# Patient Record
Sex: Female | Born: 1945 | Hispanic: No | Smoking: Former smoker
Health system: Southern US, Community
[De-identification: ages and names within clinical notes are randomized; demographics above are authoritative.]

## PROBLEM LIST (undated history)

## (undated) DIAGNOSIS — I8290 Acute embolism and thrombosis of unspecified vein: Secondary | ICD-10-CM

## (undated) DIAGNOSIS — I420 Dilated cardiomyopathy: Secondary | ICD-10-CM

## (undated) DIAGNOSIS — I96 Gangrene, not elsewhere classified: Secondary | ICD-10-CM

## (undated) DIAGNOSIS — R200 Anesthesia of skin: Secondary | ICD-10-CM

## (undated) DIAGNOSIS — I82409 Acute embolism and thrombosis of unspecified deep veins of unspecified lower extremity: Secondary | ICD-10-CM

## (undated) DIAGNOSIS — Z5189 Encounter for other specified aftercare: Secondary | ICD-10-CM

## (undated) DIAGNOSIS — E1165 Type 2 diabetes mellitus with hyperglycemia: Secondary | ICD-10-CM

## (undated) DIAGNOSIS — I2510 Atherosclerotic heart disease of native coronary artery without angina pectoris: Secondary | ICD-10-CM

## (undated) DIAGNOSIS — K219 Gastro-esophageal reflux disease without esophagitis: Secondary | ICD-10-CM

## (undated) DIAGNOSIS — I70201 Unspecified atherosclerosis of native arteries of extremities, right leg: Secondary | ICD-10-CM

## (undated) DIAGNOSIS — I255 Ischemic cardiomyopathy: Secondary | ICD-10-CM

## (undated) DIAGNOSIS — I509 Heart failure, unspecified: Secondary | ICD-10-CM

## (undated) DIAGNOSIS — I82403 Acute embolism and thrombosis of unspecified deep veins of lower extremity, bilateral: Secondary | ICD-10-CM

## (undated) DIAGNOSIS — Z9289 Personal history of other medical treatment: Secondary | ICD-10-CM

## (undated) DIAGNOSIS — E118 Type 2 diabetes mellitus with unspecified complications: Secondary | ICD-10-CM

## (undated) HISTORY — DX: Acute embolism and thrombosis of unspecified deep veins of unspecified lower extremity: I82.409

## (undated) HISTORY — PX: VAGINAL HYSTERECTOMY: SUR661

## (undated) HISTORY — DX: Atherosclerotic heart disease of native coronary artery without angina pectoris: I25.10

## (undated) HISTORY — PX: FACIAL RECONSTRUCTION SURGERY: SHX631

## (undated) HISTORY — DX: Encounter for other specified aftercare: Z51.89

## (undated) HISTORY — DX: Acute embolism and thrombosis of unspecified vein: I82.90

## (undated) HISTORY — PX: CARDIAC CATHETERIZATION: SHX172

## (undated) HISTORY — DX: Heart failure, unspecified: I50.9

## (undated) HISTORY — DX: Gangrene, not elsewhere classified: I96

## (undated) HISTORY — DX: Anesthesia of skin: R20.0

---

## 1979-04-25 HISTORY — PX: SPINE SURGERY: SHX786

## 1979-04-25 HISTORY — PX: LUMBAR DISC SURGERY: SHX700

## 2001-07-09 ENCOUNTER — Encounter: Payer: Self-pay | Admitting: *Deleted

## 2001-07-09 ENCOUNTER — Emergency Department (HOSPITAL_COMMUNITY): Admission: EM | Admit: 2001-07-09 | Discharge: 2001-07-09 | Payer: Self-pay | Admitting: *Deleted

## 2006-09-29 ENCOUNTER — Emergency Department (HOSPITAL_COMMUNITY): Admission: EM | Admit: 2006-09-29 | Discharge: 2006-09-29 | Payer: Self-pay | Admitting: Emergency Medicine

## 2006-10-27 ENCOUNTER — Emergency Department (HOSPITAL_COMMUNITY): Admission: EM | Admit: 2006-10-27 | Discharge: 2006-10-27 | Payer: Self-pay | Admitting: Emergency Medicine

## 2008-04-04 ENCOUNTER — Emergency Department (HOSPITAL_COMMUNITY): Admission: EM | Admit: 2008-04-04 | Discharge: 2008-04-04 | Payer: Self-pay | Admitting: Emergency Medicine

## 2008-09-01 ENCOUNTER — Emergency Department (HOSPITAL_COMMUNITY): Admission: EM | Admit: 2008-09-01 | Discharge: 2008-09-01 | Payer: Self-pay | Admitting: Emergency Medicine

## 2010-02-25 ENCOUNTER — Emergency Department (HOSPITAL_COMMUNITY): Admission: EM | Admit: 2010-02-25 | Discharge: 2010-02-25 | Payer: Self-pay | Admitting: Emergency Medicine

## 2010-11-09 LAB — POCT I-STAT, CHEM 8
BUN: 11 mg/dL (ref 6–23)
Calcium, Ion: 1.45 mmol/L — ABNORMAL HIGH (ref 1.12–1.32)
Chloride: 104 mEq/L (ref 96–112)
Creatinine, Ser: 0.9 mg/dL (ref 0.4–1.2)
Glucose, Bld: 296 mg/dL — ABNORMAL HIGH (ref 70–99)
HCT: 42 % (ref 36.0–46.0)
Hemoglobin: 14.3 g/dL (ref 12.0–15.0)
Potassium: 4.5 mEq/L (ref 3.5–5.1)
Sodium: 136 mEq/L (ref 135–145)
TCO2: 25 mmol/L (ref 0–100)

## 2010-11-09 LAB — RAPID STREP SCREEN (MED CTR MEBANE ONLY): Streptococcus, Group A Screen (Direct): NEGATIVE

## 2011-05-22 LAB — URINE MICROSCOPIC-ADD ON

## 2011-05-22 LAB — COMPREHENSIVE METABOLIC PANEL
ALT: 24
AST: 17
Albumin: 4
CO2: 24
Calcium: 11.5 — ABNORMAL HIGH
GFR calc Af Amer: >60
GFR calc non Af Amer: 56 — ABNORMAL LOW
Sodium: 135
Total Protein: 7.8

## 2011-05-22 LAB — CBC
HCT: 43.8
Hemoglobin: 15
MCHC: 34.3
MCV: 88.1
Platelets: 295
RBC: 4.97
RDW: 13.3
WBC: 8.3

## 2011-05-22 LAB — DIFFERENTIAL
Eosinophils Absolute: 0.1
Eosinophils Relative: 1
Lymphs Abs: 3.1
Monocytes Absolute: 0.6
Monocytes Relative: 8

## 2011-05-22 LAB — URINALYSIS, ROUTINE W REFLEX MICROSCOPIC
Bilirubin Urine: NEGATIVE
Glucose, UA: >1000 — AB
Hgb urine dipstick: NEGATIVE
Ketones, ur: NEGATIVE
pH: 5.5

## 2011-08-12 ENCOUNTER — Ambulatory Visit: Payer: Self-pay

## 2011-08-12 DIAGNOSIS — E109 Type 1 diabetes mellitus without complications: Secondary | ICD-10-CM

## 2012-02-15 ENCOUNTER — Emergency Department (HOSPITAL_COMMUNITY)
Admission: EM | Admit: 2012-02-15 | Discharge: 2012-02-15 | Disposition: A | Discharge status: Home / Self Care | Payer: Self-pay | Source: Home / Self Care | Attending: Emergency Medicine | Admitting: Emergency Medicine

## 2012-02-15 ENCOUNTER — Encounter (HOSPITAL_COMMUNITY): Payer: Self-pay

## 2012-02-15 DIAGNOSIS — N764 Abscess of vulva: Secondary | ICD-10-CM

## 2012-02-15 DIAGNOSIS — L259 Unspecified contact dermatitis, unspecified cause: Secondary | ICD-10-CM

## 2012-02-15 DIAGNOSIS — L309 Dermatitis, unspecified: Secondary | ICD-10-CM

## 2012-02-15 LAB — POCT RAPID STREP A: Streptococcus, Group A Screen (Direct): NEGATIVE

## 2012-02-15 MED ORDER — TRIAMCINOLONE ACETONIDE 0.5 % EX OINT: TOPICAL_OINTMENT | Freq: Two times a day (BID) | CUTANEOUS | Status: DC

## 2012-02-15 MED ORDER — HYDROXYZINE HCL 25 MG PO TABS: 25.0000 mg | ORAL_TABLET | Freq: Four times a day (QID) | ORAL | Status: AC

## 2012-02-15 MED ORDER — HYDROCODONE-ACETAMINOPHEN 5-500 MG PO TABS: 1.0000 | ORAL_TABLET | Freq: Four times a day (QID) | ORAL | Status: AC | PRN

## 2012-02-15 MED ORDER — DOXYCYCLINE HYCLATE 100 MG PO CAPS: 100.0000 mg | ORAL_CAPSULE | Freq: Two times a day (BID) | ORAL | Status: AC

## 2012-02-15 NOTE — ED Notes (Signed)
C/o itchy rash all over for 2 months.  Also c/o swollen painful area to labia for 1 week.

## 2012-02-15 NOTE — Discharge Instructions (Signed)
Keep wound clean and dry. Can clean with soap and water. Take the prescribed medications as instructed. Be aware that Vicodin can make you drowsy and he should not drive after taking this medication. Can also take over-the-counter ibuprofen every 8 hours as needed for pain and swelling. Return in 24 hours for packing removal and wound check. 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 Followup with dermatologist if persistent or worsening skin rash.

## 2012-02-16 NOTE — ED Provider Notes (Addendum)
History     CSN: [id number]  Arrival date & time 02/15/12  1345   First MD Initiated Contact with Patient 02/15/12 1350      Chief Complaint  Patient presents with  . Rash  . Recurrent Skin Infections    (Consider location/radiation/quality/duration/timing/severity/associated sxs/prior treatment) HPI Comments: 66 year old female with history of recurrent strep throat. Here complaining of:  #1) generalized body rash for 2 months. Started in the legs arms and torso in the form of dry, raised, pruriginous plaques. Has used over-the-counter hydrocortisone with some improvement. No fever. No joint pain or swelling. No general malaise. No mouth ulcers. #2) Tender knot in the right genital area for 2 days.    History reviewed. No pertinent past medical history.  Past Surgical History  Procedure Date  . Abdominal hysterectomy   . Back surgery     No family history on file.  History  Substance Use Topics  . Smoking status: Never Smoker   . Smokeless tobacco: Not on file  . Alcohol Use: No    OB History    Grav Para Term Preterm Abortions TAB SAB Ect Mult Living                  Review of Systems  Constitutional: Negative for fever, chills and appetite change.       10 systems reviewed and  pertinent negative and positive symptoms are as per HPI.     HENT: Negative for congestion, sore throat and neck pain.   Gastrointestinal: Negative for nausea, vomiting, abdominal pain and diarrhea.  Skin: Positive for rash.  Neurological: Negative for dizziness and headaches.  All other systems reviewed and are negative.    Allergies  Penicillins and Sulfa antibiotics  Home Medications   Current Outpatient Rx  Name Route Sig Dispense Refill  . DOXYCYCLINE HYCLATE 100 MG PO CAPS Oral Take 1 capsule (100 mg total) by mouth 2 (two) times daily. 20 capsule 0  . HYDROCODONE-ACETAMINOPHEN 5-500 MG PO TABS Oral Take 1 tablet by mouth every 6 (six) hours as needed for pain. 15 tablet  0  . HYDROXYZINE HCL 25 MG PO TABS Oral Take 1 tablet (25 mg total) by mouth every 6 (six) hours. 20 tablet 0  . TRIAMCINOLONE ACETONIDE 0.5 % EX OINT Topical Apply topically 2 (two) times daily. 30 g 0    BP 158/73  Pulse 99  Temp 98.3 F (36.8 C) (Oral)  Resp 18  SpO2 98%  Physical Exam  Nursing note and vitals reviewed. Constitutional: She is oriented to person, place, and time. She appears well-developed and well-nourished. No distress.  HENT:  Head: Normocephalic and atraumatic.  Nose: Nose normal.  Mouth/Throat: Oropharynx is clear and moist. No oropharyngeal exudate.  Eyes: Conjunctivae are normal. Pupils are equal, round, and reactive to light. No scleral icterus.  Neck: Normal range of motion. Neck supple.  Cardiovascular: Normal heart sounds.   Pulmonary/Chest: Effort normal and breath sounds normal.  Abdominal: Soft. She exhibits no distension. There is no tenderness.  Genitourinary:       There is an area of erythema and tender fluctuant mass of about 2.5 cm diameter. Located in right labia majora. findings consistent with an abscess. No significant associated cellulitis. Mass is not close to vaginal vestibule or labia minora no findings suggestive of Bartolins cyst.    No inguinal enlarged lymphadenopathies.   Lymphadenopathy:    She has no cervical adenopathy.  Neurological: She is alert and oriented to person, place,  and time.  Skin:       Raised, dry, red plaques of different size with peeling of the skin scattered in extremities and torso. Pruriginous.       ED Course  INCISION AND DRAINAGE Performed by: Sharin Grave Authorized by: Sharin Grave Consent: Verbal consent obtained. Risks and benefits: risks, benefits and alternatives were discussed Consent given by: patient Patient understanding: patient states understanding of the procedure being performed Patient consent: the patient's understanding of the procedure matches consent given Type:  abscess Body area: anogenital Location details: vulva Anesthesia: local infiltration Local anesthetic: lidocaine 1% with epinephrine Anesthetic total: 2 ml Scalpel size: 11 Incision type: single straight Complexity: simple Drainage: purulent Drainage amount: moderate Packing material: 1/4 in iodoform gauze Patient tolerance: Patient tolerated the procedure well with no immediate complications. Comments: Dry dressing and antibiotic ointment applied. Abscess culture pending.   (including critical care time)   Labs Reviewed  POCT RAPID STREP A ([facility] CARE ONLY)  CULTURE, ROUTINE-ABSCESS   No results found.   1. Dermatitis   2. Labial abscess       MDM  Psoriasis-like rash in this patient with recurrent strep throat infections. Suggestive of guttate psoriasis although negative rapid strep test today. Appears responsive to steroids. Prescribed triamcinolone ointment and hydroxyzine. Allergic to penicillins and sulfas.  Right labial abscess was drained today. Treated with doxycycline, cultures pending. Asked to return in 24 or 48-hour is for packing removal. Or return earlier if fever or worsening symptoms.        Sharin Grave, MD 02/17/12 1412  Sharin Grave, MD 02/17/12 1415

## 2012-02-18 LAB — CULTURE, ROUTINE-ABSCESS

## 2012-05-24 DIAGNOSIS — I82403 Acute embolism and thrombosis of unspecified deep veins of lower extremity, bilateral: Secondary | ICD-10-CM

## 2012-05-24 DIAGNOSIS — I70201 Unspecified atherosclerosis of native arteries of extremities, right leg: Secondary | ICD-10-CM

## 2012-05-24 HISTORY — DX: Unspecified atherosclerosis of native arteries of extremities, right leg: I70.201

## 2012-05-24 HISTORY — DX: Acute embolism and thrombosis of unspecified deep veins of lower extremity, bilateral: I82.403

## 2012-06-21 ENCOUNTER — Encounter (HOSPITAL_COMMUNITY): Payer: Self-pay | Admitting: *Deleted

## 2012-06-21 ENCOUNTER — Other Ambulatory Visit: Payer: Self-pay

## 2012-06-21 ENCOUNTER — Inpatient Hospital Stay (HOSPITAL_COMMUNITY): Payer: Medicare Other

## 2012-06-21 ENCOUNTER — Inpatient Hospital Stay (HOSPITAL_COMMUNITY)
Admission: EM | Admit: 2012-06-21 | Discharge: 2012-07-09 | DRG: 287 | Disposition: A | Discharge status: Home / Self Care | Payer: Medicare Other | Attending: Cardiology | Admitting: Cardiology

## 2012-06-21 ENCOUNTER — Emergency Department (HOSPITAL_COMMUNITY): Payer: Medicare Other

## 2012-06-21 DIAGNOSIS — I447 Left bundle-branch block, unspecified: Secondary | ICD-10-CM | POA: Diagnosis present

## 2012-06-21 DIAGNOSIS — R1310 Dysphagia, unspecified: Secondary | ICD-10-CM | POA: Diagnosis present

## 2012-06-21 DIAGNOSIS — R0902 Hypoxemia: Secondary | ICD-10-CM | POA: Insufficient documentation

## 2012-06-21 DIAGNOSIS — I5043 Acute on chronic combined systolic (congestive) and diastolic (congestive) heart failure: Principal | ICD-10-CM | POA: Diagnosis present

## 2012-06-21 DIAGNOSIS — I129 Hypertensive chronic kidney disease with stage 1 through stage 4 chronic kidney disease, or unspecified chronic kidney disease: Secondary | ICD-10-CM | POA: Diagnosis present

## 2012-06-21 DIAGNOSIS — Z882 Allergy status to sulfonamides status: Secondary | ICD-10-CM

## 2012-06-21 DIAGNOSIS — L0390 Cellulitis, unspecified: Secondary | ICD-10-CM | POA: Diagnosis present

## 2012-06-21 DIAGNOSIS — E210 Primary hyperparathyroidism: Secondary | ICD-10-CM | POA: Diagnosis present

## 2012-06-21 DIAGNOSIS — Z8249 Family history of ischemic heart disease and other diseases of the circulatory system: Secondary | ICD-10-CM

## 2012-06-21 DIAGNOSIS — N179 Acute kidney failure, unspecified: Secondary | ICD-10-CM | POA: Diagnosis present

## 2012-06-21 DIAGNOSIS — L0291 Cutaneous abscess, unspecified: Secondary | ICD-10-CM

## 2012-06-21 DIAGNOSIS — I255 Ischemic cardiomyopathy: Secondary | ICD-10-CM | POA: Diagnosis present

## 2012-06-21 DIAGNOSIS — Z794 Long term (current) use of insulin: Secondary | ICD-10-CM

## 2012-06-21 DIAGNOSIS — E875 Hyperkalemia: Secondary | ICD-10-CM | POA: Diagnosis present

## 2012-06-21 DIAGNOSIS — I509 Heart failure, unspecified: Secondary | ICD-10-CM | POA: Insufficient documentation

## 2012-06-21 DIAGNOSIS — J90 Pleural effusion, not elsewhere classified: Secondary | ICD-10-CM | POA: Diagnosis present

## 2012-06-21 DIAGNOSIS — I5041 Acute combined systolic (congestive) and diastolic (congestive) heart failure: Secondary | ICD-10-CM

## 2012-06-21 DIAGNOSIS — I82409 Acute embolism and thrombosis of unspecified deep veins of unspecified lower extremity: Secondary | ICD-10-CM | POA: Diagnosis present

## 2012-06-21 DIAGNOSIS — E119 Type 2 diabetes mellitus without complications: Secondary | ICD-10-CM | POA: Diagnosis present

## 2012-06-21 DIAGNOSIS — L02419 Cutaneous abscess of limb, unspecified: Secondary | ICD-10-CM | POA: Diagnosis present

## 2012-06-21 DIAGNOSIS — I824Z9 Acute embolism and thrombosis of unspecified deep veins of unspecified distal lower extremity: Secondary | ICD-10-CM | POA: Diagnosis present

## 2012-06-21 DIAGNOSIS — Z880 Allergy status to penicillin: Secondary | ICD-10-CM

## 2012-06-21 DIAGNOSIS — I2589 Other forms of chronic ischemic heart disease: Secondary | ICD-10-CM | POA: Diagnosis present

## 2012-06-21 DIAGNOSIS — Z86718 Personal history of other venous thrombosis and embolism: Secondary | ICD-10-CM

## 2012-06-21 DIAGNOSIS — D638 Anemia in other chronic diseases classified elsewhere: Secondary | ICD-10-CM | POA: Diagnosis present

## 2012-06-21 DIAGNOSIS — A498 Other bacterial infections of unspecified site: Secondary | ICD-10-CM | POA: Diagnosis not present

## 2012-06-21 DIAGNOSIS — R0600 Dyspnea, unspecified: Secondary | ICD-10-CM

## 2012-06-21 DIAGNOSIS — N183 Chronic kidney disease, stage 3 unspecified: Secondary | ICD-10-CM | POA: Diagnosis present

## 2012-06-21 DIAGNOSIS — I5021 Acute systolic (congestive) heart failure: Secondary | ICD-10-CM

## 2012-06-21 DIAGNOSIS — R600 Localized edema: Secondary | ICD-10-CM | POA: Diagnosis present

## 2012-06-21 DIAGNOSIS — I743 Embolism and thrombosis of arteries of the lower extremities: Secondary | ICD-10-CM | POA: Diagnosis present

## 2012-06-21 DIAGNOSIS — N390 Urinary tract infection, site not specified: Secondary | ICD-10-CM | POA: Diagnosis not present

## 2012-06-21 DIAGNOSIS — I739 Peripheral vascular disease, unspecified: Secondary | ICD-10-CM

## 2012-06-21 DIAGNOSIS — I4891 Unspecified atrial fibrillation: Secondary | ICD-10-CM | POA: Diagnosis present

## 2012-06-21 DIAGNOSIS — R609 Edema, unspecified: Secondary | ICD-10-CM

## 2012-06-21 DIAGNOSIS — I2510 Atherosclerotic heart disease of native coronary artery without angina pectoris: Secondary | ICD-10-CM | POA: Diagnosis present

## 2012-06-21 LAB — BLOOD GAS, ARTERIAL
Acid-Base Excess: 0.3 mmol/L (ref 0.0–2.0)
Bicarbonate: 24 mEq/L (ref 20.0–24.0)
TCO2: 21.4 mmol/L (ref 0–100)
pCO2 arterial: 37.3 mmHg (ref 35.0–45.0)
pH, Arterial: 7.424 (ref 7.350–7.450)
pO2, Arterial: 67.3 mmHg — ABNORMAL LOW (ref 80.0–100.0)

## 2012-06-21 LAB — CBC WITH DIFFERENTIAL/PLATELET
Basophils Absolute: 0 10*3/uL (ref 0.0–0.1)
Eosinophils Relative: 2 % (ref 0–5)
HCT: 36 % (ref 36.0–46.0)
Hemoglobin: 11.5 g/dL — ABNORMAL LOW (ref 12.0–15.0)
Lymphocytes Relative: 27 % (ref 12–46)
MCHC: 31.9 g/dL (ref 30.0–36.0)
MCV: 81.8 fL (ref 78.0–100.0)
Monocytes Absolute: 0.7 10*3/uL (ref 0.1–1.0)
Monocytes Relative: 9 % (ref 3–12)
Neutro Abs: 5.3 10*3/uL (ref 1.7–7.7)
RDW: 15.2 % (ref 11.5–15.5)
WBC: 8.4 10*3/uL (ref 4.0–10.5)

## 2012-06-21 LAB — BASIC METABOLIC PANEL
BUN: 12 mg/dL (ref 6–23)
CO2: 25 mEq/L (ref 19–32)
Chloride: 101 mEq/L (ref 96–112)
Creatinine, Ser: 0.9 mg/dL (ref 0.50–1.10)
Potassium: 3.8 mEq/L (ref 3.5–5.1)

## 2012-06-21 LAB — PRO B NATRIURETIC PEPTIDE: Pro B Natriuretic peptide (BNP): 9636 pg/mL — ABNORMAL HIGH (ref 0–125)

## 2012-06-21 MED ORDER — ONDANSETRON HCL 4 MG/2ML IJ SOLN
4.0000 mg | Freq: Four times a day (QID) | INTRAMUSCULAR | Status: DC | PRN
  Administered 2012-06-23 – 2012-06-29 (×11): 4 mg via INTRAVENOUS
  Filled 2012-06-21 (×12): qty 2

## 2012-06-21 MED ORDER — FUROSEMIDE 10 MG/ML IJ SOLN
20.0000 mg | Freq: Once | INTRAMUSCULAR | Status: AC
  Administered 2012-06-21: 20 mg via INTRAVENOUS
  Filled 2012-06-21: qty 4

## 2012-06-21 MED ORDER — SODIUM CHLORIDE 0.9 % IV SOLN
Freq: Once | INTRAVENOUS | Status: AC
  Administered 2012-06-21: 10 mL/h via INTRAVENOUS

## 2012-06-21 MED ORDER — VANCOMYCIN HCL 1000 MG IV SOLR
750.0000 mg | Freq: Two times a day (BID) | INTRAVENOUS | Status: DC
  Administered 2012-06-22: 750 mg via INTRAVENOUS
  Filled 2012-06-21: qty 750

## 2012-06-21 MED ORDER — MORPHINE SULFATE 2 MG/ML IJ SOLN
2.0000 mg | INTRAMUSCULAR | Status: DC | PRN
  Administered 2012-06-21 – 2012-07-07 (×16): 2 mg via INTRAVENOUS
  Filled 2012-06-21 (×17): qty 1

## 2012-06-21 MED ORDER — ONDANSETRON HCL 4 MG PO TABS
4.0000 mg | ORAL_TABLET | Freq: Four times a day (QID) | ORAL | Status: DC | PRN
  Administered 2012-06-23 – 2012-06-29 (×5): 4 mg via ORAL
  Filled 2012-06-21 (×5): qty 1

## 2012-06-21 MED ORDER — IOHEXOL 350 MG/ML SOLN
100.0000 mL | Freq: Once | INTRAVENOUS | Status: AC | PRN
  Administered 2012-06-21: 100 mL via INTRAVENOUS

## 2012-06-21 NOTE — ED Notes (Signed)
MD at bedside. 

## 2012-06-21 NOTE — ED Provider Notes (Cosign Needed Addendum)
History     CSN: [id number]  Arrival date & time 06/21/12  1557   First MD Initiated Contact with Patient 06/21/12 1646      Chief Complaint  Patient presents with  . Shortness of Breath  . Leg Swelling    (Consider location/radiation/quality/duration/timing/severity/associated sxs/prior treatment) The history is provided by the patient.   66 year old, female, presents to emergency department complaining of lower extremity, swelling.  For the past 2 weeks.  She says she is nasal congestion, and postnasal drainage, which causes her to cough, occasionally.  She coughs up white sputum.  She denies pain anywhere except in her legs.  She denies shortness of breath.  She has not had nausea, vomiting, fevers, or chills.  She denies recent surgery or travel.  She has not had a history of congestive heart failure, renal or kidney disease.  She denies thyroid disease.  She has always slept on 2 pillows and this is unchanged.  She has nocturia times one, which is normal for her.  History reviewed. No pertinent past medical history.  Past Surgical History  Procedure Date  . Abdominal hysterectomy   . Back surgery   . Cesarean section     History reviewed. No pertinent family history.  History  Substance Use Topics  . Smoking status: Never Smoker   . Smokeless tobacco: Never Used  . Alcohol Use: No    OB History    Grav Para Term Preterm Abortions TAB SAB Ect Mult Living                  Review of Systems  Constitutional: Negative for fever, chills and diaphoresis.  Respiratory: Positive for cough. Negative for chest tightness and shortness of breath.   Cardiovascular: Positive for leg swelling. Negative for chest pain.  Gastrointestinal: Negative for nausea, vomiting, abdominal pain and diarrhea.  Musculoskeletal: Negative for back pain.  Skin: Negative for rash.  Neurological: Negative for weakness and headaches.  Hematological: Does not bruise/bleed easily.    Psychiatric/Behavioral: Negative for confusion.  All other systems reviewed and are negative.    Allergies  Penicillins and Sulfa antibiotics  Home Medications   Current Outpatient Rx  Name Route Sig Dispense Refill  . IBUPROFEN 200 MG PO TABS Oral Take 200 mg by mouth every 6 (six) hours as needed. Pain    . ADULT MULTIVITAMIN LIQUID CH Oral Take 5 mLs by mouth daily.    . TRIAMCINOLONE ACETONIDE 0.5 % EX OINT Topical Apply 1 application topically 2 (two) times daily.      BP 126/69  Pulse 93  Temp 98.2 F (36.8 C) (Oral)  Resp 16  SpO2 94%  Physical Exam  Nursing note and vitals reviewed. Constitutional: She is oriented to person, place, and time. She appears well-developed and well-nourished. No distress.  HENT:  Head: Normocephalic and atraumatic.  Eyes: Conjunctivae normal and EOM are normal.  Neck: Normal range of motion. Neck supple. No JVD present.  Cardiovascular: Normal rate, regular rhythm and intact distal pulses.   No murmur heard. Pulmonary/Chest: Effort normal. No respiratory distress. She has rales.       Bilateral basilar rales posteriorly  Abdominal: Soft. Bowel sounds are normal. She exhibits no distension. There is no rebound and no guarding.  Musculoskeletal: Normal range of motion. She exhibits edema. She exhibits no tenderness.       3+ bilateral lower extremity pitting edema from the knees, to her feet  Neurological: She is alert and oriented to person,  place, and time. No cranial nerve deficit.  Skin: Skin is warm and dry. No rash noted. No erythema.  Psychiatric: She has a normal mood and affect. Thought content normal.    ED Course  Procedures (including critical care time) bilateral pitting edema, with rales.  Room air, hypoxia, and cough with clear sputum.  Symptoms suggest congestive heart failure.  We will perform chest x-ray, and laboratory testing, for evaluation.  I will establish an IV and give her IV Lasix.   Labs Reviewed  BASIC  METABOLIC PANEL  CBC WITH DIFFERENTIAL   Dg Chest 2 View  06/21/2012  *RADIOLOGY REPORT*  Clinical Data: Shortness of breath, cough, hyperglycemia  CHEST - 2 VIEW  Comparison: None.  Findings: Borderline cardiomegaly.  No pulmonary edema.  Bilateral small pleural effusion with bilateral basilar atelectasis or infiltrate.  There is thickening or small amount of fluid in the right minor fissure.  IMPRESSION: No pulmonary edema.  Bilateral small pleural effusion with bilateral basilar atelectasis or infiltrate.   Original Report Authenticated By: Natasha Mead, M.D.      No diagnosis found.  ECG. Normal sinus rhythm at 94 beats per minute. Left bundle branch block. Normal axis. Occasional PVC. Nonspecific T-wave changes  9:36 PM Spoke with Dr. Conley Rolls.  He will come admit pt for further eval and tx  MDM  Lower extremity, swelling, with room air, hypoxia        Cheri Guppy, MD 06/21/12 2055  Cheri Guppy, MD 06/21/12 2136

## 2012-06-21 NOTE — ED Notes (Signed)
RT at bedside.

## 2012-06-21 NOTE — H&P (Signed)
[facility] History and Physical  [name] [name]:[id number] DOB: [birth year]    PCP:   She has no PCP.  Chief Complaint: Leg swelling and pain.  HPI: [name] [name] is an 66 y.o. female with no significant medical problem but only by virtue of having seen no PCP for over 30 years, presents to ER with bilateral lower extremity edema for 2 weeks, with mild DOE, but no chest pain.  She has "sprained" her right ankle three months ago.  The legs started to swell with increased erythema and calf tenderness.  She admitted to loud snorning, but denied any exertional chest pain, pleuritic CP, fever, chill, or cough.  She was a smoker, but no longer smokes.  Evaluation in the ER with ABG on RA showing 7.4/37/paO2 67.  This gives a a-A gradient of 36 with expected gradient of only 20.  Her WBC is normal, Hb 11.4.  Her Calcium is slightly elevated to 11.  Her Na is 133, and she has normal renal fx tests.  Her CXR showed mild pleural effusions bilaterally, and mild cardiomegaly, but no edema.  Her EKG showed LBBB with no old to compare.  No troponin was obtained.  She was given 20mg  of IV lasix and hospitalist was asked to admit her for further work up of all those above problems.  Rewiew of Systems:  Constitutional: Negative for malaise, fever and chills. No significant weight loss or weight gain Eyes: Negative for eye pain, redness and discharge, diplopia, visual changes, or flashes of light. ENMT: Negative for ear pain, hoarseness, nasal congestion, sinus pressure and sore throat. No headaches; tinnitus, drooling, or problem swallowing. Cardiovascular: Negative for chest pain, palpitations, diaphoresis, dyspnea and peripheral edema. ; No orthopnea, PND Respiratory: Negative for cough, hemoptysis, wheezing and stridor. No pleuritic chestpain. Gastrointestinal: Negative for nausea, vomiting, diarrhea, constipation, abdominal pain, melena, blood in stool, hematemesis, jaundice and rectal  bleeding.    Genitourinary: Negative for frequency, dysuria, incontinence,flank pain and hematuria; Musculoskeletal: Negative for back pain and neck pain.  Skin: . Negative for pruritus, rash, abrasions, bruising and skin lesion.; ulcerations Neuro: Negative for headache, lightheadedness and neck stiffness. Negative for weakness, altered level of consciousness , altered mental status, extremity weakness, burning feet, involuntary movement, seizure and syncope.  Psych: negative for anxiety, depression, insomnia, tearfulness, panic attacks, hallucinations, paranoia, suicidal or homicidal ideation    History reviewed. No pertinent past medical history.  Past Surgical History  Procedure Date  . Abdominal hysterectomy   . Back surgery   . Cesarean section     Medications:  HOME MEDS: Prior to Admission medications   Medication Sig Start Date End Date Taking? Authorizing Provider  ibuprofen (ADVIL,MOTRIN) 200 MG tablet Take 200 mg by mouth every 6 (six) hours as needed. Pain   Yes Historical Provider, MD  Multiple Vitamin (MULTIVITAMIN) LIQD Take 5 mLs by mouth daily.   Yes Historical Provider, MD  triamcinolone ointment (KENALOG) 0.5 % Apply 1 application topically 2 (two) times daily. 02/15/12 02/14/13 Yes Adlih Moreno-Coll, MD     Allergies:  Allergies  Allergen Reactions  . Penicillins Anaphylaxis  . Sulfa Antibiotics Anaphylaxis    Social History:   reports that she has never smoked. She has never used smokeless tobacco. She reports that she does not drink alcohol or use illicit drugs.  Family History: History reviewed. No pertinent family history.   Physical Exam: Filed Vitals:   06/21/12 1719 06/21/12 1726 06/21/12 1800 06/21/12 2030  BP:  128/76 119/74  Pulse:   91 103  Temp:      TempSrc:      Resp:   23 16  SpO2: 95% 94% 94% 97%   Blood pressure 119/74, pulse 103, temperature 98.2 F (36.8 C), temperature source Oral, resp. rate 16, SpO2 97.00%.  GEN:   Pleasant  patient lying in the stretcher in no acute distress; cooperative with exam. PSYCH:  alert and oriented x4; does not appear anxious or depressed; affect is appropriate. HEENT: Mucous membranes pink and anicteric; PERRLA; EOM intact; no cervical lymphadenopathy nor thyromegaly or carotid bruit; no JVD; There were no stridor. Neck is very supple. Breasts:: Not examined CHEST WALL: No tenderness CHEST: Normal respiration, She has bilateral crackles with no wheezes.  HEART: Regular rate and rhythm.  There are no murmur, rub, or gallops.   BACK: No kyphosis or scoliosis; no CVA tenderness ABDOMEN: soft and non-tender; no masses, no organomegaly, normal abdominal bowel sounds; no pannus; no intertriginous candida. There is no rebound and no distention. Rectal Exam: Not done EXTREMITIES: No bone or joint deformity; age-appropriate arthropathy of the hands and knees; She has erythema with bilateral edema. Genitalia: not examined PULSES: 2+ and symmetric SKIN: Normal hydration no rash or ulceration CNS: Cranial nerves 2-12 grossly intact no focal lateralizing neurologic deficit.  Speech is fluent; uvula elevated with phonation, facial symmetry and tongue midline. DTR are normal bilaterally, cerebella exam is intact, barbinski is negative and strengths are equaled bilaterally.  No sensory loss.   Labs on Admission:  Basic Metabolic Panel:  Lab 06/21/12 4540  NA 133*  K 3.8  CL 101  CO2 25  GLUCOSE 159*  BUN 12  CREATININE 0.90  CALCIUM 11.0*  MG --  PHOS --   Liver Function Tests: No results found for this basename: AST:5,ALT:5,ALKPHOS:5,BILITOT:5,PROT:5,ALBUMIN:5 in the last 168 hours No results found for this basename: LIPASE:5,AMYLASE:5 in the last 168 hours No results found for this basename: AMMONIA:5 in the last 168 hours CBC:  Lab 06/21/12 1806  WBC 8.4  NEUTROABS 5.3  HGB 11.5*  HCT 36.0  MCV 81.8  PLT 282   Cardiac Enzymes: No results found for this basename:  CKTOTAL:5,CKMB:5,CKMBINDEX:5,TROPONINI:5 in the last 168 hours  CBG: No results found for this basename: GLUCAP:5 in the last 168 hours   Radiological Exams on Admission: Dg Chest 2 View  06/21/2012  *RADIOLOGY REPORT*  Clinical Data: Shortness of breath, cough, hyperglycemia  CHEST - 2 VIEW  Comparison: None.  Findings: Borderline cardiomegaly.  No pulmonary edema.  Bilateral small pleural effusion with bilateral basilar atelectasis or infiltrate.  There is thickening or small amount of fluid in the right minor fissure.  IMPRESSION: No pulmonary edema.  Bilateral small pleural effusion with bilateral basilar atelectasis or infiltrate.   Original Report Authenticated By: Natasha Mead, M.D.     EKG: Independently reviewed.  LBBB.   Assessment/Plan Present on Admission:  .Hypoxemia .Pedal edema .Cellulitis .CHF (congestive heart failure) .LBBB (left bundle branch block)  PLAN:  This is a 66 yo who has not gotten any primary care for over 30 years, presents with hypoxemia and quite elevated A-a gradient of 37 where it really should be about 20.  She also has bilateral pedal edema, with cellulitis, and possible DVTs.  She has symptoms suggestive of CHF, along with having LBBB, but no chest pain.  Her history suggests that she may have undiagnosed sleep apnea as well.  Part of the problem is that she has not  received adequate prior medical work up.  She also has a large Aa gradient with hypoxemia with paO2 of only 67.  Her differential is broad.  I will admit her to telemetry for work up to include ECHO, leg doppler, CTPA, TSH, r/out with serial troponins.  I will start her on IV heparin until we can exclude DVT and PE.  Although the differential is broad, and she will get the proper work up, my highest clinical suspicion is that she has CHF.    Will admit her to telemetry under [facility] service, she is a full code.  Other plans as per orders.  Code Status: FULL Unk Lightning, MD. [facility] Pager [phone] 7pm to 7am.  06/21/2012, 10:24 PM

## 2012-06-21 NOTE — ED Notes (Signed)
Pt states for the past week she's been having bil leg pain, worse pain in R leg, pt has swelling in legs and feet, pt states [facility] went to [facility] and was told she has signs of CHF, pt states "that doctor doesn't know me though, I get constipated and get bloated, then i take a laxative and the swelling goes away, I can tell I"m swollen from my feet to under my breasts". Denies n/v/d. Pt states she has fell mult times, last time in June. Denies numbness/tinlging/SOB, states she has coughing at times to bring mucus up from sinus infection.

## 2012-06-22 ENCOUNTER — Encounter (HOSPITAL_COMMUNITY): Payer: Self-pay | Admitting: Emergency Medicine

## 2012-06-22 DIAGNOSIS — M79609 Pain in unspecified limb: Secondary | ICD-10-CM

## 2012-06-22 DIAGNOSIS — R609 Edema, unspecified: Secondary | ICD-10-CM

## 2012-06-22 DIAGNOSIS — R0989 Other specified symptoms and signs involving the circulatory and respiratory systems: Secondary | ICD-10-CM

## 2012-06-22 DIAGNOSIS — M7989 Other specified soft tissue disorders: Secondary | ICD-10-CM

## 2012-06-22 LAB — CBC
HCT: 34.2 % — ABNORMAL LOW (ref 36.0–46.0)
MCHC: 32.2 g/dL (ref 30.0–36.0)
MCV: 81.8 fL (ref 78.0–100.0)
Platelets: 253 10*3/uL (ref 150–400)
RDW: 15.3 % (ref 11.5–15.5)

## 2012-06-22 LAB — TSH: TSH: 2.776 u[IU]/mL (ref 0.350–4.500)

## 2012-06-22 LAB — HEPARIN LEVEL (UNFRACTIONATED): Heparin Unfractionated: 0.51 IU/mL (ref 0.30–0.70)

## 2012-06-22 MED ORDER — FUROSEMIDE 10 MG/ML IJ SOLN
40.0000 mg | Freq: Every day | INTRAMUSCULAR | Status: DC
  Administered 2012-06-22: 40 mg via INTRAVENOUS
  Filled 2012-06-22: qty 4

## 2012-06-22 MED ORDER — FUROSEMIDE 10 MG/ML IJ SOLN
40.0000 mg | Freq: Two times a day (BID) | INTRAMUSCULAR | Status: DC
  Administered 2012-06-22 – 2012-06-24 (×4): 40 mg via INTRAVENOUS
  Filled 2012-06-22 (×6): qty 4

## 2012-06-22 MED ORDER — WARFARIN - PHARMACIST DOSING INPATIENT: Freq: Every day | Status: DC

## 2012-06-22 MED ORDER — SODIUM CHLORIDE 0.9 % IJ SOLN
3.0000 mL | Freq: Two times a day (BID) | INTRAMUSCULAR | Status: DC
  Administered 2012-06-22 – 2012-06-29 (×7): 3 mL via INTRAVENOUS

## 2012-06-22 MED ORDER — DOCUSATE SODIUM 100 MG PO CAPS
100.0000 mg | ORAL_CAPSULE | Freq: Two times a day (BID) | ORAL | Status: DC
  Administered 2012-06-22 – 2012-07-09 (×27): 100 mg via ORAL
  Filled 2012-06-22 (×36): qty 1

## 2012-06-22 MED ORDER — WARFARIN SODIUM 5 MG PO TABS
5.0000 mg | ORAL_TABLET | Freq: Once | ORAL | Status: AC
  Administered 2012-06-22: 5 mg via ORAL
  Filled 2012-06-22: qty 1

## 2012-06-22 MED ORDER — HEPARIN (PORCINE) IN NACL 100-0.45 UNIT/ML-% IJ SOLN
1100.0000 [IU]/h | INTRAMUSCULAR | Status: DC
  Administered 2012-06-22 – 2012-06-26 (×5): 1100 [IU]/h via INTRAVENOUS
  Filled 2012-06-22 (×7): qty 250

## 2012-06-22 MED ORDER — SODIUM CHLORIDE 0.9 % IJ SOLN: 3.0000 mL | INTRAMUSCULAR | Status: DC | PRN

## 2012-06-22 MED ORDER — WARFARIN VIDEO
Freq: Once | Status: AC
  Administered 2012-06-22: 1

## 2012-06-22 MED ORDER — SODIUM CHLORIDE 0.9 % IV SOLN
250.0000 mL | INTRAVENOUS | Status: DC | PRN
  Administered 2012-06-23: 250 mL via INTRAVENOUS

## 2012-06-22 MED ORDER — VANCOMYCIN HCL IN DEXTROSE 1-5 GM/200ML-% IV SOLN
1000.0000 mg | Freq: Two times a day (BID) | INTRAVENOUS | Status: DC
  Administered 2012-06-22 – 2012-06-23 (×3): 1000 mg via INTRAVENOUS
  Filled 2012-06-22 (×4): qty 200

## 2012-06-22 MED ORDER — ASPIRIN EC 325 MG PO TBEC
325.0000 mg | DELAYED_RELEASE_TABLET | Freq: Every day | ORAL | Status: DC
  Administered 2012-06-27 – 2012-06-29 (×2): 325 mg via ORAL
  Filled 2012-06-22 (×8): qty 1

## 2012-06-22 MED ORDER — SODIUM CHLORIDE 0.9 % IJ SOLN
3.0000 mL | Freq: Two times a day (BID) | INTRAMUSCULAR | Status: DC
  Administered 2012-06-22 – 2012-07-09 (×29): 3 mL via INTRAVENOUS

## 2012-06-22 MED ORDER — VITAMINS A & D EX OINT
TOPICAL_OINTMENT | CUTANEOUS | Status: AC
  Administered 2012-06-22: 22:00:00
  Filled 2012-06-22: qty 5

## 2012-06-22 MED ORDER — HEPARIN BOLUS VIA INFUSION
3000.0000 [IU] | Freq: Once | INTRAVENOUS | Status: AC
  Administered 2012-06-22: 3000 [IU] via INTRAVENOUS
  Filled 2012-06-22: qty 3000

## 2012-06-22 MED ORDER — COUMADIN BOOK
Freq: Once | Status: AC
  Administered 2012-06-22: 1
  Filled 2012-06-22: qty 1

## 2012-06-22 NOTE — Progress Notes (Addendum)
ANTICOAGULATION CONSULT NOTE - Follow Up Consult  Pharmacy Consult for IV heparin Indication: new DVT  Allergies  Allergen Reactions  . Penicillins Anaphylaxis  . Sulfa Antibiotics Anaphylaxis    Patient Measurements: Height: 5\' 6"  (167.6 cm) Weight: 186 lb 4.6 oz (84.5 kg) (standing scales) IBW/kg (Calculated) : 59.3  Heparin Dosing Weight: 66 kg  Labs:  Basename 06/22/12 0855 06/22/12 0500 06/21/12 2335 06/21/12 1806  HGB 11.0* -- -- 11.5*  HCT 34.2* -- -- 36.0  PLT 253 -- -- 282  APTT -- 123* -- --  LABPROT -- 16.0* -- --  INR -- 1.31 -- --  HEPARINUNFRC 0.51 -- -- --  CREATININE -- -- -- 0.90  CKTOTAL -- -- -- --  CKMB -- -- -- --  TROPONINI -- <0.30 <0.30 --    Estimated Creatinine Clearance: 68.3 ml/min (by C-G formula based on Cr of 0.9).  Assessment:  68 yof presented 10/29 with BLE swelling x 2 weeks, mild DOE. IV heparin started for r/o DVT.  On 10/30, preliminary result with + DVT in posterior tibial vein.  IV heparin currently running at 1100 units/hr and 1st heparin level therapeutic.   MD aware of new DVT and plan to bridge with Coumadin once report is finalized.   CBC okay, no bleeding/complications reported  Goal of Therapy:  INR 2-3 Heparin level 0.3-0.7 units/ml Monitor platelets by anticoagulation protocol: Yes   Plan:   Continue IV heparin at 1100 units/hr  Recheck heparin level at 1500 today  Daily heparin level and CBC  Pharmacy will follow up with MD order to dose Coumadin  Geoffry Paradise, PharmD, BCPS Pager: [phone] 10:06 AM Pharmacy #: [id number]   Addendum:  Received verbal order from Dr. Izola Price to start Coumadin given positive DVT. Today will be D1 Coumadin/Heparin bridge.  Coumadin score = 3.  Baseline INR 1.31.    Plan: Coumadin 5mg  po x 1 tonight. Daily PT/INR. Coumadin book/video. Will follow up with coumadin education later this week.  Geoffry Paradise, PharmD, BCPS Pager: [phone] 1:46 PM Pharmacy #: [id number]

## 2012-06-22 NOTE — Progress Notes (Signed)
PHARMACY BRIEF NOTE - IV HEPARIN  Pharmacy consult for:  Heparin Indication: New DVT  With infusion of 1,100 units/hr, the heparin level at 14:50 is reported as 0.56 units/ml.  This level is within the therapeutic range (0.3-0.7 units/ml).  The patient's nurse has not noticed any signs of bleeding.  Plan:  Continue current infusion rate overnight  Follow daily heparin level and CBC.  Polo Riley R.Ph. 06/22/2012 5:10 PM

## 2012-06-22 NOTE — Progress Notes (Signed)
ANTICOAGULATION CONSULT NOTE - Initial Consult  Pharmacy Consult for Heparin Indication: R/O DVT  Allergies  Allergen Reactions  . Penicillins Anaphylaxis  . Sulfa Antibiotics Anaphylaxis    Patient Measurements: Height: 5\' 6"  (167.6 cm) Weight: 186 lb 4.6 oz (84.5 kg) (standing scales) IBW/kg (Calculated) : 59.3  Heparin Dosing Weight: 66 kg  Vital Signs: Temp: 98.2 F (36.8 C) (10/30 0052) Temp src: Oral (10/30 0052) BP: 131/58 mmHg (10/30 0052) Pulse Rate: 103  (10/30 0052)  Labs:  Basename 06/21/12 2335 06/21/12 1806  HGB -- 11.5*  HCT -- 36.0  PLT -- 282  APTT -- --  LABPROT -- --  INR -- --  HEPARINUNFRC -- --  CREATININE -- 0.90  CKTOTAL -- --  CKMB -- --  TROPONINI <0.30 --    Estimated Creatinine Clearance: 68.3 ml/min (by C-G formula based on Cr of 0.9).   Medical History: History reviewed. No pertinent past medical history.  Medications:  Scheduled:    . sodium chloride   Intravenous Once  . aspirin EC  325 mg Oral Daily  . docusate sodium  100 mg Oral BID  . furosemide  20 mg Intravenous Once  . furosemide  40 mg Intravenous Daily  . sodium chloride  3 mL Intravenous Q12H  . sodium chloride  3 mL Intravenous Q12H  . vancomycin  1,000 mg Intravenous Q12H  . DISCONTD: vancomycin  750 mg Intravenous Q12H   Infusions:    Assessment: 66 yo female admitted with LE swelling, cellulitis, R/O DVT.  MD ordering IV heparin until DVT R/O. Goal of Therapy:  Heparin level 0.3-0.7 units/ml Monitor platelets by anticoagulation protocol: Yes   Plan:   Baseline coags now.  Heparin 3000 units bolus IV X1   Start drip @ 1100 units/hr  Daily CBC/HL  Check 1st HL 6 hours after drip started.   Susanne Greenhouse R 06/22/2012,1:16 AM

## 2012-06-22 NOTE — Care Management Note (Unsigned)
    Page 1 of 2   07/05/2012     1:16:30 PM   CARE MANAGEMENT NOTE 07/05/2012  Patient:  [name] [name], [name] [name]   Account Number:  [id number]  Date Initiated:  06/22/2012  Documentation initiated by:  Lanier Clam  Subjective/Objective Assessment:   ADMITTED W/LEG SWELLING.PLEURAL EFFUSION.     Action/Plan:   FROM HOME ALONE.   Anticipated DC Date:  07/05/2012   Anticipated DC Plan:  ACUTE TO ACUTE TRANS      DC Planning Services  CM consult  Medication Assistance      Choice offered to / List presented to:             Status of service:  Completed, signed off Medicare Important Message given?   (If response is "NO", the following Medicare IM given date fields will be blank) Date Medicare IM given:   Date Additional Medicare IM given:    Discharge Disposition:  ACUTE TO ACUTE TRANS  Per UR Regulation:  Reviewed for med. necessity/level of care/duration of stay  If discussed at Long Length of Stay Meetings, dates discussed:   06/28/2012  06/30/2012  07/05/2012    Comments:  07/05/12 Mykale Gandolfo RN,BSN NCM [phone] HEART CATH  TODAY.   07/04/12 Brinnley Lacap RN,BSN NCM [phone] FOR HEART CATH ONCE INR LESS 1.7.DOES NOT HAVE SCRIPT COVERAGE,BUT HER PHARMACY PROVIDES DISCOUNTS ON HER MEDS THAT IS AFFORABLE & SHE PREFERS TO USE.DECLINES INDIGENT FUNDS.PROVIDED HER W/COMMUNITY RESOURCE OF DEPT SOCIAL SERVICES FOR MEDICAID.  06/30/12 Nilza Eaker RN,BSN NCM [phone] CARDIO FOLLOWING-CHF.DVT,DM,PVD,UTI.WILL CHECK BENEFIT FOR SCRIPT COVERAGE.  06/28/12 Raha Tennison RN,BSN NCM [phone] PER MD-ELEVATED HGB AIC,DM,DM EDUC CONS.WILL AWAIT RECOMMENDATIONS & FOLLOW FOR PROGRESS/D/C PLANS. 06/27/12 Garvey Westcott RN,BSN NCM [phone] HAS OWN RESOURCES IN PLACE FOR SCRIPTS W/CURRENT PHARMACY.  06/22/12 Catcher Dehoyos RN,BSN NCM [phone] QUALIFIES FOR INDIGENT FUNDS IF NEEDED.PROVIDED W/COMMUNITY RESOURCES/[facility]/TARGET $4 MED LIST.

## 2012-06-22 NOTE — Progress Notes (Signed)
Right:  DVT noted posterior tibial vein.  No evidence of superficial thrombosis.  No Baker's cyst.  Left: DVT noted profunda vein.  No evidence of superficial thrombosis.  No Baker's cyst.

## 2012-06-22 NOTE — ED Notes (Signed)
Pt to be placed in 1416

## 2012-06-22 NOTE — Progress Notes (Signed)
ANTIBIOTIC CONSULT NOTE - INITIAL  Pharmacy Consult for Vancomycin Indication: Cellulitis  Allergies  Allergen Reactions  . Penicillins Anaphylaxis  . Sulfa Antibiotics Anaphylaxis    Patient Measurements: Height: 5\' 6"  (167.6 cm) Weight: 183 lb (83.008 kg) IBW/kg (Calculated) : 59.3    Vital Signs: Temp: 98.2 F (36.8 C) (10/29 1630) Temp src: Oral (10/29 1630) BP: 122/79 mmHg (10/30 0019) Pulse Rate: 93  (10/29 2245) Intake/Output from previous day:   Intake/Output from this shift:    Labs:  [facility] 06/21/12 1806  WBC 8.4  HGB 11.5*  PLT 282  LABCREA --  CREATININE 0.90   Estimated Creatinine Clearance: 67.7 ml/min (by C-G formula based on Cr of 0.9). No results found for this basename: VANCOTROUGH:2,VANCOPEAK:2,VANCORANDOM:2,GENTTROUGH:2,GENTPEAK:2,GENTRANDOM:2,TOBRATROUGH:2,TOBRAPEAK:2,TOBRARND:2,AMIKACINPEAK:2,AMIKACINTROU:2,AMIKACIN:2, in the last 72 hours   Microbiology: No results found for this or any previous visit (from the past 720 hour(s)).  Medical History: History reviewed. No pertinent past medical history.  Medications:  Scheduled:    . sodium chloride   Intravenous Once  . furosemide  20 mg Intravenous Once  . vancomycin  750 mg Intravenous Q12H   Infusions:   Assessment: 66 yo female c/o bilateral LE edema, increased erythema and calf tenderness.  MD ordering Vancomycin for cellulitis.  Goal of Therapy:  Vancomycin trough level 10-15 mcg/ml  Plan:   Vancomycin 1000mg  IV q12h.  CrCl~64 (N)  Pt received 750mg  x1 in ER.  F/U SCr/levels as needed.  Susanne Greenhouse R 06/22/2012,12:56 AM

## 2012-06-22 NOTE — Progress Notes (Signed)
Patient ID: [name] [name], female   DOB: 28-Jul-1946, 66 y.o.   MRN: [id number] [facility] PROGRESS NOTE  KERRIANN KAMPHUIS WUJ:[id number] DOB: [birth year] DOA: 06/21/2012 PCP: No primary provider on file.  Brief narrative: Pt is 66 yo female who was admitted for evaluation of progressively worsening shortness of breath associated with bilateral lower extremity edema and erythema, tenderness to palpation  Principal Problem:  *Pedal edema - physical exam suggestive of cellulitis but DVT findings also consistent with DVT in the right lower extremity - I will continue Vancomycin for suspected cellulitis and will also continue Heparin drip - keep extremity elevated Active Problems: DVT in the lower extremity - continue heparin with transition to Coumadin  Hypoxemia - likely secondary to acute CHF exacerbation - further evaluation pending 2 D ECHO - no PE on CT angio - will continue Lasix IV - monitor daily weights and I's/O's  Cellulitis - continue current antibiotic regimen Anemia of chronic disease - Hg and Hct are stable and at pt's baseline - CBC in AM  CHF (congestive heart failure) - pt has no known history of CHF but physical exam findings and imaging tests are suggestive of it - 2 D ECHO pending - continue Lasix IV  LBBB (left bundle branch block) - monitor on telemetry  Consultants:  None  Procedures/Studies: Dg Chest 2 View 06/21/2012    IMPRESSION:  No pulmonary edema.  Bilateral small pleural effusion with bilateral basilar atelectasis or infiltrate.     Ct Angio Chest Pe W/cm &/or Wo Cm 06/21/2012   IMPRESSION:   1.  No evidence of pulmonary embolus.  2.  Large bilateral pleural effusions.  3.  Ground-glass attenuation compatible with edema.  4.  Airspace consolidation and collapse of the lung bases bilaterally, likely reflecting atelectasis.  5.  Borderline right paratracheal lymph nodes are likely reactive.      Antibiotics:  Vancomycin  10/29 -->  Code Status: Full Family Communication: Pt at bedside Disposition Plan: Home when medically stable  HPI/Subjective: No events overnight.   Objective: Filed Vitals:   06/21/12 2312 06/22/12 0019 06/22/12 0052 06/22/12 0557  BP:  122/79 131/58 122/72  Pulse:   103 95  Temp:   98.2 F (36.8 C) 98.3 F (36.8 C)  TempSrc:   Oral Oral  Resp:  18 16 20   Height: 5\' 6"  (1.676 m)  5\' 6"  (1.676 m)   Weight: 83.008 kg (183 lb)  84.5 kg (186 lb 4.6 oz)   SpO2:  94% 94% 98%    Intake/Output Summary (Last 24 hours) at 06/22/12 1515 Last data filed at 06/22/12 1400  Gross per 24 hour  Intake    240 ml  Output   1200 ml  Net   -960 ml    Exam:   General:  Pt is alert, follows commands appropriately, not in acute distress  Cardiovascular: Regular rhythm, tachycardic, S1/S2, no murmurs, no rubs, no gallops  Respiratory: Clear to auscultation bilaterally, no wheezing, bibasilar crackles   Abdomen: Soft, non tender, non distended, bowel sounds present, no guarding  Extremities: Bilateral lower extremity + 1 edema with erythema improved since yesterday, pulses DP and PT palpable bilaterally  Neuro: Grossly nonfocal  Data Reviewed: Basic Metabolic Panel:  Lab 06/21/12 9562  NA 133*  K 3.8  CL 101  CO2 25  GLUCOSE 159*  BUN 12  CREATININE 0.90  CALCIUM 11.0*  MG --  PHOS --   CBC:  Lab 06/22/12 0855 06/21/12 1806  WBC  7.7 8.4  NEUTROABS -- 5.3  HGB 11.0* 11.5*  HCT 34.2* 36.0  MCV 81.8 81.8  PLT 253 282   Cardiac Enzymes:  Lab 06/22/12 1150 06/22/12 0500 06/21/12 2335  CKTOTAL -- -- --  CKMB -- -- --  CKMBINDEX -- -- --  TROPONINI <0.30 <0.30 <0.30   Scheduled Meds:   . sodium chloride   Intravenous Once  . aspirin EC  325 mg Oral Daily  . docusate sodium  100 mg Oral BID  . furosemide  20 mg Intravenous Once  . furosemide  40 mg Intravenous Daily  . heparin  3,000 Units Intravenous Once  . sodium chloride  3 mL Intravenous Q12H  . sodium  chloride  3 mL Intravenous Q12H  . vancomycin  1,000 mg Intravenous Q12H  . DISCONTD: vancomycin  750 mg Intravenous Q12H   Continuous Infusions:   . heparin 1,100 Units/hr (06/22/12 0235)     Debbora Presto, MD  [facility] Pager [phone]  If 7PM-7AM, please contact night-coverage [email or web address] Password [email or web address] 06/22/2012, 3:15 PM   LOS: 1 day

## 2012-06-23 DIAGNOSIS — I319 Disease of pericardium, unspecified: Secondary | ICD-10-CM

## 2012-06-23 LAB — CBC
HCT: 33.6 % — ABNORMAL LOW (ref 36.0–46.0)
MCHC: 31.5 g/dL (ref 30.0–36.0)
MCV: 82.4 fL (ref 78.0–100.0)
Platelets: 269 10*3/uL (ref 150–400)
RDW: 15.2 % (ref 11.5–15.5)

## 2012-06-23 LAB — BASIC METABOLIC PANEL
CO2: 25 mEq/L (ref 19–32)
Calcium: 10.9 mg/dL — ABNORMAL HIGH (ref 8.4–10.5)
Creatinine, Ser: 1.07 mg/dL (ref 0.50–1.10)
Glucose, Bld: 140 mg/dL — ABNORMAL HIGH (ref 70–99)

## 2012-06-23 LAB — HEPARIN LEVEL (UNFRACTIONATED): Heparin Unfractionated: 0.33 IU/mL (ref 0.30–0.70)

## 2012-06-23 MED ORDER — OXYCODONE-ACETAMINOPHEN 5-325 MG PO TABS
1.0000 | ORAL_TABLET | ORAL | Status: DC | PRN
  Administered 2012-06-23 – 2012-06-29 (×17): 2 via ORAL
  Administered 2012-07-02 – 2012-07-04 (×4): 1 via ORAL
  Administered 2012-07-05: 2 via ORAL
  Administered 2012-07-05 – 2012-07-07 (×4): 1 via ORAL
  Administered 2012-07-07: 2 via ORAL
  Administered 2012-07-08 – 2012-07-09 (×4): 1 via ORAL
  Filled 2012-06-23 (×5): qty 2
  Filled 2012-06-23 (×3): qty 1
  Filled 2012-06-23 (×2): qty 2
  Filled 2012-06-23: qty 1
  Filled 2012-06-23 (×2): qty 2
  Filled 2012-06-23 (×2): qty 1
  Filled 2012-06-23 (×3): qty 2
  Filled 2012-06-23 (×3): qty 1
  Filled 2012-06-23: qty 2
  Filled 2012-06-23: qty 1
  Filled 2012-06-23 (×4): qty 2
  Filled 2012-06-23: qty 1
  Filled 2012-06-23 (×2): qty 2
  Filled 2012-06-23: qty 1
  Filled 2012-06-23: qty 2

## 2012-06-23 MED ORDER — VANCOMYCIN HCL IN DEXTROSE 1-5 GM/200ML-% IV SOLN
1000.0000 mg | INTRAVENOUS | Status: DC
  Administered 2012-06-24: 1000 mg via INTRAVENOUS
  Filled 2012-06-23: qty 200

## 2012-06-23 MED ORDER — WARFARIN SODIUM 7.5 MG PO TABS
7.5000 mg | ORAL_TABLET | Freq: Once | ORAL | Status: AC
  Administered 2012-06-23: 7.5 mg via ORAL
  Filled 2012-06-23: qty 1

## 2012-06-23 NOTE — Progress Notes (Signed)
*  PRELIMINARY RESULTS* Echocardiogram 2D Echocardiogram has been performed.  [name] [name] 06/23/2012, 11:48 AM

## 2012-06-23 NOTE — Progress Notes (Signed)
Patient ID: [name] [name], female   DOB: 1945-10-25, 66 y.o.   MRN: [id number]  [facility] PROGRESS NOTE  RAGNA KRAMLICH WUJ:[id number] DOB: March 10, 1946 DOA: 06/21/2012 PCP: No primary provider on file.  Brief narrative:  Pt is 66 yo female who was admitted for evaluation of progressively worsening shortness of breath associated with bilateral lower extremity edema and erythema, tenderness to palpation.  Principal Problem:  *Pedal edema  - physical exam suggestive of cellulitis but DVT findings also consistent with DVT in the right lower extremity  - continue Vancomycin for suspected cellulitis and will also continue Heparin drip  - keep extremity elevated   Active Problems:  DVT in the lower extremity  - continue heparin with transition to Coumadin  Hypoxemia  - likely secondary to acute CHF exacerbation  - now resolved and no crackles on exam this AM - further evaluation pending 2 D ECHO  - no PE on CT angio  - will continue Lasix but will transition to PO - monitor daily weights and I's/O's  Cellulitis  - continue current antibiotic regimen  Anemia of chronic disease  - Hg and Hct are stable and at pt's baseline  - CBC in AM  CHF (congestive heart failure)  - pt has no known history of CHF but physical exam findings and imaging tests are suggestive of it  - 2 D ECHO pending  - continue Lasix but change to PO LBBB (left bundle branch block)  - monitor on telemetry   Consultants:  None  Procedures/Studies:  Dg Chest 2 View  06/21/2012  IMPRESSION:  No pulmonary edema. Bilateral small pleural effusion with bilateral basilar atelectasis or infiltrate.   Ct Angio Chest Pe W/cm &/or Wo Cm  06/21/2012  IMPRESSION:  1. No evidence of pulmonary embolus.  2. Large bilateral pleural effusions.  3. Ground-glass attenuation compatible with edema.  4. Airspace consolidation and collapse of the lung bases bilaterally, likely reflecting atelectasis.  5.  Borderline right paratracheal lymph nodes are likely reactive.   Antibiotics:  Vancomycin 10/29 -->  Code Status: Full  Family Communication: Pt at bedside  Disposition Plan: Home when medically stable    HPI/Subjective: No events overnight.   Objective: Filed Vitals:   06/22/12 0557 06/22/12 1533 06/22/12 2118 06/23/12 0506  BP: 122/72 110/70 118/74 109/71  Pulse: 95 92 94 95  Temp: 98.3 F (36.8 C) 98.3 F (36.8 C) 98.2 F (36.8 C) 98.5 F (36.9 C)  TempSrc: Oral Oral Oral Oral  Resp: 20 20 20 20   Height:      Weight:    84.2 kg (185 lb 10 oz)  SpO2: 98% 96% 91% 93%    Intake/Output Summary (Last 24 hours) at 06/23/12 1208 Last data filed at 06/23/12 1004  Gross per 24 hour  Intake 1840.58 ml  Output   2150 ml  Net -309.42 ml    Exam:   General:  Pt is alert, follows commands appropriately, not in acute distress  Cardiovascular: Regular rate and rhythm, S1/S2, no murmurs, no rubs, no gallops  Respiratory: Clear to auscultation bilaterally, no wheezing, no crackles, no rhonchi  Abdomen: Soft, non tender, non distended, bowel sounds present, no guarding  Extremities: +1 bilateral lower extremity pitting edema, pulses DP and PT palpable bilaterally  Neuro: Grossly nonfocal  Data Reviewed: Basic Metabolic Panel:  Lab 06/23/12 9562 06/21/12 1806  NA 135 133*  K 3.8 3.8  CL 100 101  CO2 25 25  GLUCOSE 140* 159*  BUN  17 12  CREATININE 1.07 0.90  CALCIUM 10.9* 11.0*  MG -- --  PHOS -- --   Liver Function Tests: No results found for this basename: AST:5,ALT:5,ALKPHOS:5,BILITOT:5,PROT:5,ALBUMIN:5 in the last 168 hours No results found for this basename: LIPASE:5,AMYLASE:5 in the last 168 hours No results found for this basename: AMMONIA:5 in the last 168 hours CBC:  Lab 06/23/12 0540 06/22/12 0855 06/21/12 1806  WBC 6.7 7.7 8.4  NEUTROABS -- -- 5.3  HGB 10.6* 11.0* 11.5*  HCT 33.6* 34.2* 36.0  MCV 82.4 81.8 81.8  PLT 269 253 282   Cardiac  Enzymes:  Lab 06/22/12 1150 06/22/12 0500 06/21/12 2335  CKTOTAL -- -- --  CKMB -- -- --  CKMBINDEX -- -- --  TROPONINI <0.30 <0.30 <0.30     Scheduled Meds:   . aspirin EC  325 mg Oral Daily  . coumadin book   Does not apply Once  . docusate sodium  100 mg Oral BID  . furosemide  40 mg Intravenous BID  . sodium chloride  3 mL Intravenous Q12H  . sodium chloride  3 mL Intravenous Q12H  . vancomycin  1,000 mg Intravenous Q24H  . vitamin A & D      . warfarin  5 mg Oral ONCE-1800  . warfarin  7.5 mg Oral ONCE-1800  . warfarin   Does not apply Once  . Warfarin - Pharmacist Dosing Inpatient   Does not apply q1800  . DISCONTD: furosemide  40 mg Intravenous Daily  . DISCONTD: vancomycin  1,000 mg Intravenous Q12H   Continuous Infusions:   . heparin 1,100 Units/hr (06/22/12 2312)     Debbora Presto, MD  [facility] Pager [phone]  If 7PM-7AM, please contact night-coverage [email or web address] Password [email or web address] 06/23/2012, 12:08 PM   LOS: 2 days

## 2012-06-23 NOTE — Progress Notes (Addendum)
ANTIBIOTIC CONSULT NOTE - FOLLOW UP  Pharmacy Consult for Vancomycin Indication: cellulitis  Allergies  Allergen Reactions  . Penicillins Anaphylaxis  . Sulfa Antibiotics Anaphylaxis    Patient Measurements: Height: 5\' 6"  (167.6 cm) Weight: 185 lb 10 oz (84.2 kg) IBW/kg (Calculated) : 59.3   Labs:  Basename 06/23/12 0540 06/22/12 0855 06/21/12 1806  WBC 6.7 7.7 8.4  HGB 10.6* 11.0* 11.5*  PLT 269 253 282  LABCREA -- -- --  CREATININE 1.07 -- 0.90    Assessment:  65 yof presented 10/29 with BLE swelling with cellulitis. Now with diagnose of new DVT. Patient is on D#3 of Vancomycin.  Afebrile, WBC and Scr wnl.  CrCl 57 CG, 59 N. No microbiology data. Patient with PCN allergy (anaphylaxis) -  MD mentioned of possibility of changing to PO doxycycline on rounds. Pharmacy will monitor INR closely if doxycycline started given possible interaction.  Goal of Therapy:  Vancomycin trough level 10-15 mcg/ml  Plan:  Change Vancomycin to 1500 mg IV q24h MD, consider narrowing antibiotics   Geoffry Paradise, PharmD, BCPS Pager: [phone] 11:02 AM Pharmacy #: [id number]    Addendum:   Scr trending up to 1.55 today.  Change Vancomycin to 1000 mg q24h.  Please consider narrowing antibiotics.

## 2012-06-23 NOTE — Progress Notes (Signed)
Pt reports feeling of something caught in her throat when she eats solid food.  No issues with liquids.  Reports that she had grossly infected tonsils/adnoids at one point, causing similar symptoms.  Will notify MD. Nino Parsley

## 2012-06-23 NOTE — Progress Notes (Signed)
ANTICOAGULATION CONSULT NOTE - Follow Up Consult  Pharmacy Consult for IV heparin, Coumadin Indication: new DVT  Allergies  Allergen Reactions  . Penicillins Anaphylaxis  . Sulfa Antibiotics Anaphylaxis   Patient Measurements: Height: 5\' 6"  (167.6 cm) Weight: 185 lb 10 oz (84.2 kg) IBW/kg (Calculated) : 59.3  Heparin Dosing Weight: 66 kg  Labs:  Basename 06/23/12 0540 06/22/12 1450 06/22/12 1150 06/22/12 0855 06/22/12 0500 06/21/12 2335 06/21/12 1806  HGB 10.6* -- -- 11.0* -- -- --  HCT 33.6* -- -- 34.2* -- -- 36.0  PLT 269 -- -- 253 -- -- 282  APTT -- -- -- -- 123* -- --  LABPROT 14.5 -- -- -- 16.0* -- --  INR 1.15 -- -- -- 1.31 -- --  HEPARINUNFRC 0.33 0.56 -- 0.51 -- -- --  CREATININE 1.07 -- -- -- -- -- 0.90  CKTOTAL -- -- -- -- -- -- --  CKMB -- -- -- -- -- -- --  TROPONINI -- -- <0.30 -- <0.30 <0.30 --    Estimated Creatinine Clearance: 57.3 ml/min (by C-G formula based on Cr of 1.07).  Assessment:  72 yof presented 10/29 with BLE swelling x 2 weeks, mild DOE. IV heparin started for r/o DVT.  On 10/30, preliminary result with + DVT in posterior tibial vein. Coumadin added.  Today is D#2 of IV heparin/Coumadin bridge.    IV heparin currently running at 1100 units/hr and heparin leves are therapeutic. INR dropped s/p coumadin initiation yesterday  CBC okay, no bleeding/complications reported  Patient will need at least 5 days of IV heparin/Coumadin bridge AND INR > 2 for 48 hours.  Goal of Therapy:  INR 2-3 Heparin level 0.3-0.7 units/ml Monitor platelets by anticoagulation protocol: Yes   Plan:   Continue IV heparin at 1100 units/hr  Coumadin 7.5 mg po x 1 tonight  Daily heparin level, CBC and PT/INR  Continue IV heparin/Coumadin bridge for at least 5 days AND INR > 2 for 48 hours.  Pharmacy will f/u with coumadin education  Geoffry Paradise, PharmD, BCPS Pager: [phone] 10:45 AM Pharmacy #: [phone]

## 2012-06-24 LAB — BASIC METABOLIC PANEL
BUN: 23 mg/dL (ref 6–23)
CO2: 26 mEq/L (ref 19–32)
Chloride: 100 mEq/L (ref 96–112)
Creatinine, Ser: 1.55 mg/dL — ABNORMAL HIGH (ref 0.50–1.10)
Glucose, Bld: 199 mg/dL — ABNORMAL HIGH (ref 70–99)

## 2012-06-24 LAB — CBC
HCT: 32.1 % — ABNORMAL LOW (ref 36.0–46.0)
Hemoglobin: 10.1 g/dL — ABNORMAL LOW (ref 12.0–15.0)
MCH: 26 pg (ref 26.0–34.0)
MCHC: 31.5 g/dL (ref 30.0–36.0)
MCV: 82.7 fL (ref 78.0–100.0)

## 2012-06-24 MED ORDER — WARFARIN SODIUM 7.5 MG PO TABS
7.5000 mg | ORAL_TABLET | Freq: Once | ORAL | Status: AC
  Administered 2012-06-24: 7.5 mg via ORAL
  Filled 2012-06-24: qty 1

## 2012-06-24 MED ORDER — FUROSEMIDE 10 MG/ML IJ SOLN
20.0000 mg | Freq: Two times a day (BID) | INTRAMUSCULAR | Status: DC
  Administered 2012-06-24 – 2012-06-27 (×6): 20 mg via INTRAVENOUS
  Filled 2012-06-24 (×8): qty 2

## 2012-06-24 MED ORDER — DOXYCYCLINE HYCLATE 100 MG PO TABS
100.0000 mg | ORAL_TABLET | Freq: Two times a day (BID) | ORAL | Status: DC
  Administered 2012-06-24 – 2012-06-27 (×7): 100 mg via ORAL
  Filled 2012-06-24 (×8): qty 1

## 2012-06-24 NOTE — Progress Notes (Signed)
Pt. Complained of again choking when attempting to eat solid foods (graham cracker and fruit cup). Pt. Has not problem with pills or liquids.  Forestine Chute 06/24/2012 6:22 AM

## 2012-06-24 NOTE — Progress Notes (Signed)
ANTICOAGULATION CONSULT NOTE - Follow Up Consult  Pharmacy Consult for IV heparin, Coumadin Indication: new DVT  Allergies  Allergen Reactions  . Penicillins Anaphylaxis  . Sulfa Antibiotics Anaphylaxis   Patient Measurements: Height: 5\' 6"  (167.6 cm) Weight: 187 lb 6.3 oz (85 kg) IBW/kg (Calculated) : 59.3  Heparin Dosing Weight: 66 kg  Labs:  Basename 06/24/12 0458 06/23/12 0540 06/22/12 1450 06/22/12 1150 06/22/12 0855 06/22/12 0500 06/21/12 2335 06/21/12 1806  HGB 10.1* 10.6* -- -- -- -- -- --  HCT 32.1* 33.6* -- -- 34.2* -- -- --  PLT 244 269 -- -- 253 -- -- --  APTT -- -- -- -- -- 123* -- --  LABPROT 15.4* 14.5 -- -- -- 16.0* -- --  INR 1.24 1.15 -- -- -- 1.31 -- --  HEPARINUNFRC 0.61 0.33 0.56 -- -- -- -- --  CREATININE 1.55* 1.07 -- -- -- -- -- 0.90  CKTOTAL -- -- -- -- -- -- -- --  CKMB -- -- -- -- -- -- -- --  TROPONINI -- -- -- <0.30 -- <0.30 <0.30 --    Estimated Creatinine Clearance: 39.8 ml/min (by C-G formula based on Cr of 1.55).  Assessment:  53 yof presented 10/29 with BLE swelling x 2 weeks, mild DOE. IV heparin started for r/o DVT.  On 10/30, preliminary result with + DVT in posterior tibial vein. Coumadin added.  Today is D#3 of IV heparin/Coumadin bridge.    IV heparin currently running at 1100 units/hr and heparin levels are therapeutic. INR 1.24, as expected with Coumadin initiation.  CBC okay, no bleeding/complications reported  Patient will need at least 5 days of IV heparin/Coumadin bridge AND INR > 2 for 48 hours.  Goal of Therapy:  INR 2-3 Heparin level 0.3-0.7 units/ml Monitor platelets by anticoagulation protocol: Yes   Plan:   Continue IV heparin at 1100 units/hr  Repeat Coumadin 7.5 mg po x 1 tonight  Daily heparin level, CBC and PT/INR  Continue IV heparin/Coumadin bridge for at least 5 days AND INR > 2 for 48 hours.  Pharmacy will f/u with coumadin education today  Geoffry Paradise, PharmD, BCPS Pager: [phone] 8:13  AM Pharmacy #: [phone]

## 2012-06-24 NOTE — Evaluation (Signed)
Clinical/Bedside Swallow Evaluation Patient Details  Name: [name] [name] MRN: [id number] Date of Birth: 12/15/1945  Today's Date: 06/24/2012 Time: 4098-1191 SLP Time Calculation (min): 30 min  Past Medical History: History reviewed. No pertinent past medical history. Past Surgical History:  Past Surgical History  Procedure Date  . Abdominal hysterectomy   . Cesarean section   . Back surgery    HPI:  66 yo female adm to [facility] 06/21/12 with progressive shortness of breath- ? CHF and leg edema.  ? cellulitis versus lower extremity DVT.   During hospital coarse, pt complained of difficulties with sensing solid food lodging in pharynx and subsequently clinical swallow eval was ordered.    CT chest showed pleural effusion, airspace consolidation consistent with ATX- no PE.  Pt reports h/o infected tonsils and adenoids in the past - ? abscess for which she received antibiotic with resolution of symptoms.  Pt does acknowledge occasional sensation of feeling food lodged in throat x1 year that has worsened during hospital admission.  Pt has to wait for food to clear throat as dry swallows or liquids do not aid clearance per pt.  She denies recurrent pulmonary infections nor significant weight loss.     Assessment / Plan / Recommendation Clinical Impression  Pt presents with a functional oropharyngeal swallow from clinical swallow assessment.  CN exam was unremarkable and swallow was timely with pt having clear voice throughout.   Near empty lunch tray on pt's table as SLP entered room.  Suspect pt baseline mild dysphagia symptoms have worsened during acute illness.  Pt denies difficulties swallowing pills or liquids.  Advised pt to aspiration precautions and to monitor swallowing closely given h/o abscess in 2011 requiring ABX tx, and thyroid density lesion.  Inquired if pt saw ENT after tx, to which she stated no.   Informed pt role of SLP to assess swallow function, not structural changes, etc.  If  pt needs further assessment, would recommend ENT or evaluation of pharyngeal/cervical esophageal structures given pt's abscess and thyroid hx.    SLP to sign off.  Thanks for the consult.      Aspiration Risk  None    Diet Recommendation Regular;Thin liquid   Liquid Administration via: Cup;Straw Medication Administration: Whole meds with liquid (as tolerated) Supervision: Patient able to self feed Compensations: Slow rate;Small sips/bites Postural Changes and/or Swallow Maneuvers: Seated upright 90 degrees;Upright 30-60 min after meal    Other  Recommendations Oral Care Recommendations: Oral care BID   Follow Up Recommendations  None    Frequency and Duration        Pertinent Vitals/Pain Afebrile, decreased    SLP Swallow Goals   n/a  Swallow Study Prior Functional Status   occasional dysphagia at home with solids prior to admission    General Date of Onset: 06/24/12 HPI: 66 yo female adm to [facility]/21/12 with progressive shortness of breath- ? CHF and leg edema.  ? cellulitis versus lower extremity DVT.   During hospital coarse, pt complained of difficulties with sensing solid food lodging in pharynx and subsequently clinical swallow eval was ordered.    CT chest showed pleural effusion, airspace consolidation consistent with ATX- no PE.  Pt reports h/o infected tonsils and adenoids in the past - ? abscess for which she received antibiotic with resolution of symptoms.  Pt does acknowledge occasional sensation of feeling food lodged in throat x1 year that has worsened during hospital admission.  Pt has to wait for food to clear throat as  dry swallows or liquids do not aid clearance per pt.  She denies recurrent pulmonary infections nor significant weight loss.   Type of Study: Bedside swallow evaluation Previous Swallow Assessment: Neck CT 2011 Fullness of the left palatine tonsils, The possibility of a small abscess (6.7 x 12.2 x 11.7)  tumor can not be excluded - rec clinical  coorelation,  3.1 mm right lobe of thyroid low density lesion.  Diet Prior to this Study: Regular;Thin liquids Temperature Spikes Noted: No Respiratory Status: Room air History of Recent Intubation: No Behavior/Cognition: Alert;Cooperative;Pleasant mood Oral Cavity - Dentition:  (adequate dentition, denture) Patient Positioning: Upright in bed Baseline Vocal Quality: Clear Volitional Cough: Strong Volitional Swallow: Able to elicit    Oral/Motor/Sensory Function Overall Oral Motor/Sensory Function: Appears within functional limits for tasks assessed   Ice Chips Ice chips: Not tested   Thin Liquid Thin Liquid: Within functional limits Presentation: Cup;Self Fed    Nectar Thick Nectar Thick Liquid: Not tested   Honey Thick Honey Thick Liquid: Not tested   Puree Puree: Within functional limits Presentation: Self Fed;Spoon Other Comments: cottage cheese   Solid   GO    Solid: Within functional limits Presentation: Self Fed Other Comments: cucumber       Donavan Burnet, MS [facility] SLP [phone]

## 2012-06-24 NOTE — Progress Notes (Addendum)
Patient ID: [name] [name], female   DOB: [birth year], 65 y.o.   MRN: [id number]  [facility] PROGRESS NOTE  [name] [name] [id number]:[id number] DOB: 09/23/1945 DOA: 06/21/2012 PCP: No primary provider on file.  Brief narrative:  Pt is 66 yo female who was admitted for evaluation of progressively worsening shortness of breath associated with bilateral lower extremity edema and erythema, tenderness to palpation.   Principal Problem:  *Pedal edema  - physical exam suggestive of cellulitis but DVT findings also consistent with DVT in the right lower extremity  - continue Vancomycin for suspected cellulitis and will also continue Heparin drip  - keep extremity elevated  Active Problems:  DVT in the lower extremity  - continue heparin with transition to Coumadin  Acute renal failure - unclear etiology at this time and possibly related to Lasix - will decrease the dose of Lasix - BMP in AM Hypoxemia  - likely secondary to acute CHF exacerbation and per 2 D ECHO this is combined systolic and diastolic CHF, 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 diastolic heart failure  - now resolved and no crackles on exam this AM  - no PE on CT angio  - monitor daily weights and I's/O's  Cellulitis  - continue antibiotic but change Vancomycin to Doxycycline PO Anemia of chronic disease  - Hg and Hct are stable and at pt's baseline  - CBC in AM  CHF (congestive heart failure)  - pt has no known history of CHF but physical exam findings and imaging tests are suggestive of it  - 2 D ECHO indicating significantly reduced systolic function with grade II diastolic dysfunction  - continue Lasix  - daily weights, I's and O's LBBB (left bundle branch block)  - monitor on telemetry  Dysphagia - SLP evaluation as the cause is not clear et this time  Consultants:  None  Procedures/Studies:  Dg Chest 2 View  06/21/2012  IMPRESSION:  No pulmonary edema. Bilateral small pleural effusion with bilateral basilar  atelectasis or infiltrate.   Ct Angio Chest Pe W/cm &/or Wo Cm  06/21/2012  IMPRESSION:  1. No evidence of pulmonary embolus.  2. Large bilateral pleural effusions.  3. Ground-glass attenuation compatible with edema.  4. Airspace consolidation and collapse of the lung bases bilaterally, likely reflecting atelectasis.  5. Borderline right paratracheal lymph nodes are likely reactive.   Antibiotics:  Vancomycin 10/29 --> 1101 Doxycycline 11/01 -->  Code Status: Full  Family Communication: Pt at bedside  Disposition Plan: Home when medically stable   HPI/Subjective: No events overnight.   Objective: Filed Vitals:   06/23/12 0506 06/23/12 1300 06/23/12 2036 06/24/12 0556  BP: 109/71 115/70 104/52 108/55  Pulse: 95 92 81 86  Temp: 98.5 F (36.9 C) 98.6 F (37 C) 99 F (37.2 C) 98.5 F (36.9 C)  TempSrc: Oral  Oral Oral  Resp: 20 15 16 16   Height:      Weight: 84.2 kg (185 lb 10 oz)   85 kg (187 lb 6.3 oz)  SpO2: 93% 96% 97% 95%    Intake/Output Summary (Last 24 hours) at 06/24/12 1355 Last data filed at 06/24/12 1200  Gross per 24 hour  Intake    541 ml  Output    475 ml  Net     66 ml    Exam:   General:  Pt is alert, follows commands appropriately, not in acute distress  Cardiovascular: Regular rate and rhythm, S1/S2, no murmurs, no rubs, no gallops  Respiratory: Clear  to auscultation bilaterally, no wheezing, no crackles, no rhonchi  Abdomen: Soft, non tender, non distended, bowel sounds present, no guarding  Extremities: No edema, pulses DP and PT palpable bilaterally  Neuro: Grossly nonfocal  Data Reviewed: Basic Metabolic Panel:  Lab 06/24/12 1610 06/23/12 0540 06/21/12 1806  NA 134* 135 133*  K 4.2 3.8 3.8  CL 100 100 101  CO2 26 25 25   GLUCOSE 199* 140* 159*  BUN 23 17 12   CREATININE 1.55* 1.07 0.90  CALCIUM 10.9* 10.9* 11.0*  MG -- -- --  PHOS -- -- --   CBC:  Lab 06/24/12 0458 06/23/12 0540 06/22/12 0855 06/21/12 1806  WBC 7.6 6.7  7.7 8.4  NEUTROABS -- -- -- 5.3  HGB 10.1* 10.6* 11.0* 11.5*  HCT 32.1* 33.6* 34.2* 36.0  MCV 82.7 82.4 81.8 81.8  PLT 244 269 253 282   Cardiac Enzymes:  Lab 06/22/12 1150 06/22/12 0500 06/21/12 2335  CKTOTAL -- -- --  CKMB -- -- --  CKMBINDEX -- -- --  TROPONINI <0.30 <0.30 <0.30    Scheduled Meds:   . aspirin EC  325 mg Oral Daily  . docusate sodium  100 mg Oral BID  . furosemide  40 mg Intravenous BID  . sodium chloride  3 mL Intravenous Q12H  . sodium chloride  3 mL Intravenous Q12H  . vancomycin  1,000 mg Intravenous Q24H  . warfarin  7.5 mg Oral ONCE-1800  . warfarin  7.5 mg Oral ONCE-1800  . Warfarin - Pharmacist Dosing Inpatient   Does not apply q1800   Continuous Infusions:   . heparin 1,100 Units/hr (06/23/12 2242)     Debbora Presto, MD  [facility] Pager [phone]  If 7PM-7AM, please contact night-coverage [email or web address] Password [email or web address] 06/24/2012, 1:55 PM   LOS: 3 days

## 2012-06-25 LAB — URINALYSIS, ROUTINE W REFLEX MICROSCOPIC
Bilirubin Urine: NEGATIVE
Ketones, ur: NEGATIVE mg/dL
Nitrite: NEGATIVE
Urobilinogen, UA: 1 mg/dL (ref 0.0–1.0)
pH: 5.5 (ref 5.0–8.0)

## 2012-06-25 LAB — BASIC METABOLIC PANEL
CO2: 29 mEq/L (ref 19–32)
Calcium: 10.9 mg/dL — ABNORMAL HIGH (ref 8.4–10.5)
GFR calc Af Amer: 39 mL/min — ABNORMAL LOW (ref >90)
Glucose, Bld: 136 mg/dL — ABNORMAL HIGH (ref 70–99)
Sodium: 134 mEq/L — ABNORMAL LOW (ref 135–145)

## 2012-06-25 LAB — CBC
HCT: 32.2 % — ABNORMAL LOW (ref 36.0–46.0)
MCHC: 31.4 g/dL (ref 30.0–36.0)
MCV: 81.7 fL (ref 78.0–100.0)
RDW: 15.3 % (ref 11.5–15.5)
WBC: 8 10*3/uL (ref 4.0–10.5)

## 2012-06-25 LAB — URINE MICROSCOPIC-ADD ON

## 2012-06-25 MED ORDER — WARFARIN SODIUM 7.5 MG PO TABS
7.5000 mg | ORAL_TABLET | Freq: Once | ORAL | Status: AC
  Administered 2012-06-25: 7.5 mg via ORAL
  Filled 2012-06-25: qty 1

## 2012-06-25 NOTE — Progress Notes (Signed)
ANTICOAGULATION CONSULT NOTE - Follow Up Consult  Pharmacy Consult for IV heparin, Warfarin Indication: new DVT  Allergies  Allergen Reactions  . Penicillins Anaphylaxis  . Sulfa Antibiotics Anaphylaxis   Patient Measurements: Height: 5\' 6"  (167.6 cm) Weight: 187 lb 6.3 oz (85 kg) IBW/kg (Calculated) : 59.3  Heparin Dosing Weight: 66 kg  Labs:  Basename 06/25/12 0553 06/24/12 0458 06/23/12 0540 06/22/12 1150  HGB 10.1* 10.1* -- --  HCT 32.2* 32.1* 33.6* --  PLT 246 244 269 --  APTT -- -- -- --  LABPROT 18.9* 15.4* 14.5 --  INR 1.64* 1.24 1.15 --  HEPARINUNFRC 0.64 0.61 0.33 --  CREATININE 1.57* 1.55* 1.07 --  CKTOTAL -- -- -- --  CKMB -- -- -- --  TROPONINI -- -- -- <0.30    Estimated Creatinine Clearance: 39.3 ml/min (by C-G formula based on Cr of 1.57).  Assessment:  6 yof presented 10/29 with BLE swelling x 2 weeks, mild DOE. IV heparin started for r/o DVT.  On 10/30, preliminary result with + DVT in posterior tibial vein, warfarin added.  Today is D#4 of IV heparin/ warfarin bridge.  IV heparin currently running at 1100 units/hr and heparin level is therapeutic.  INR increased to 1.6, but still subtherapeutic.    Patient will need at least 5 days of IV heparin/ warfarin bridge AND INR > 2 for 48 hours.  CBC okay, no bleeding/complications reported  Noted new potential drug interaction with warfarin and doxycycline (may increase INR)  Goal of Therapy:  INR 2-3 Heparin level 0.3-0.7 units/ml Monitor platelets by anticoagulation protocol: Yes   Plan:   Continue IV heparin at 1100 units/hr  Repeat Coumadin 7.5 mg po x 1 tonight  Daily heparin level, CBC and PT/INR  Continue IV heparin/ warfarin bridge for at least 5 days AND INR > 2 for 48 hours.  Pharmacy completed warfarin education.  Lynann Beaver PharmD, BCPS Pager [phone] 06/25/2012 7:11 AM

## 2012-06-25 NOTE — Progress Notes (Signed)
Patient ID: [name] [name], female   DOB: Jun 12, 1946, 66 y.o.   MRN: [id number]  [facility] PROGRESS NOTE  [name] [name] DOB: March 15, 1946 DOA: 06/21/2012 PCP: No primary provider on file.  Brief narrative:  Pt is 66 yo female who was admitted for evaluation of progressively worsening shortness of breath associated with bilateral lower extremity edema and erythema, tenderness to palpation. Now with new diagnosis of right lower extremity DVT, new diagnosis of combined systolic and diastolic CHF, EF 62%.  Principal Problem:  *Pedal edema  - physical exam suggestive of cellulitis but DVT findings also consistent with DVT in the right lower extremity  - continue Doxycycline and Heparin with transition to COumadin - keep extremity elevated  Active Problems:  DVT in the lower extremity  - continue heparin with transition to Coumadin  Low grade fever - unclear etiology and CBC pending this AM - will check UA and urine culture Acute renal failure  - unclear etiology at this time and possibly related to Lasix  - BMP pending this AM - continue current dose of Lasix and check BNP in AM Hypoxemia  - likely secondary to acute CHF exacerbation and per 2 D ECHO this is combined systolic and diastolic CHF, EF 95% with grade II diastolic heart failure  - now resolved and no crackles on exam this AM  - no PE on CT angio  - monitor daily weights and I's/O's  Cellulitis  - continue Doxycycline PO  Anemia of chronic disease  - Hg and Hct are stable and at pt's baseline  - CBC in AM  CHF (congestive heart failure)  - pt has no known history of CHF but physical exam findings and imaging tests are suggestive of it  - 2 D ECHO indicating significantly reduced systolic function with grade II diastolic dysfunction  - continue Lasix  - daily weights, I's and O's  LBBB (left bundle branch block)  - monitor on telemetry  Dysphagia  - SLP evaluation with no recommendations as  pt seemed to have no difficulty swallowing during the evaluation   Consultants:  SLP  Procedures/Studies:  Dg Chest 2 View  06/21/2012  IMPRESSION:  No pulmonary edema. Bilateral small pleural effusion with bilateral basilar atelectasis or infiltrate.   Ct Angio Chest Pe W/cm &/or Wo Cm  06/21/2012  IMPRESSION:  1. No evidence of pulmonary embolus.  2. Large bilateral pleural effusions.  3. Ground-glass attenuation compatible with edema.  4. Airspace consolidation and collapse of the lung bases bilaterally, likely reflecting atelectasis.  5. Borderline right paratracheal lymph nodes are likely reactive.   Antibiotics:  Vancomycin 10/29 --> 1101  Doxycycline 11/01 -->   Code Status: Full  Family Communication: Pt at bedside  Disposition Plan: Home when INR therapeutic  HPI/Subjective: No events overnight.   Objective: Filed Vitals:   06/23/12 2036 06/24/12 0556 06/24/12 1500 06/24/12 2156  BP: 104/52 108/55 107/50 116/65  Pulse: 81 86 90 95  Temp: 99 F (37.2 C) 98.5 F (36.9 C) 97.2 F (36.2 C) 98 F (36.7 C)  TempSrc: Oral Oral Axillary Oral  Resp: 16 16 18 18   Height:      Weight:  85 kg (187 lb 6.3 oz)    SpO2: 97% 95% 94% 94%    Intake/Output Summary (Last 24 hours) at 06/25/12 0541 Last data filed at 06/24/12 2300  Gross per 24 hour  Intake 1223.75 ml  Output   1375 ml  Net -151.25 ml  Exam:   General:  Pt is alert, follows commands appropriately, not in acute distress  Cardiovascular: Regular rate and rhythm, S1/S2, no murmurs, no rubs, no gallops  Respiratory: Clear to auscultation bilaterally, no wheezing, no crackles, no rhonchi  Abdomen: Soft, non tender, non distended, bowel sounds present, no guarding  Extremities: No edema, pulses DP and PT palpable bilaterally  Neuro: Grossly nonfocal  Data Reviewed: Basic Metabolic Panel:  Lab 06/24/12 1914 06/23/12 0540 06/21/12 1806  NA 134* 135 133*  K 4.2 3.8 3.8  CL 100 100 101  CO2  26 25 25   GLUCOSE 199* 140* 159*  BUN 23 17 12   CREATININE 1.55* 1.07 0.90  CALCIUM 10.9* 10.9* 11.0*  MG -- -- --  PHOS -- -- --   Liver Function Tests: No results found for this basename: AST:5,ALT:5,ALKPHOS:5,BILITOT:5,PROT:5,ALBUMIN:5 in the last 168 hours No results found for this basename: LIPASE:5,AMYLASE:5 in the last 168 hours No results found for this basename: AMMONIA:5 in the last 168 hours CBC:  Lab 06/24/12 0458 06/23/12 0540 06/22/12 0855 06/21/12 1806  WBC 7.6 6.7 7.7 8.4  NEUTROABS -- -- -- 5.3  HGB 10.1* 10.6* 11.0* 11.5*  HCT 32.1* 33.6* 34.2* 36.0  MCV 82.7 82.4 81.8 81.8  PLT 244 269 253 282   Cardiac Enzymes:  Lab 06/22/12 1150 06/22/12 0500 06/21/12 2335  CKTOTAL -- -- --  CKMB -- -- --  CKMBINDEX -- -- --  TROPONINI <0.30 <0.30 <0.30   BNP: No components found with this basename: POCBNP:5 CBG: No results found for this basename: GLUCAP:5 in the last 168 hours  No results found for this or any previous visit (from the past 240 hour(s)).   Scheduled Meds:   . aspirin EC  325 mg Oral Daily  . docusate sodium  100 mg Oral BID  . doxycycline  100 mg Oral Q12H  . furosemide  20 mg Intravenous BID  . sodium chloride  3 mL Intravenous Q12H  . sodium chloride  3 mL Intravenous Q12H  . warfarin  7.5 mg Oral ONCE-1800  . Warfarin - Pharmacist Dosing Inpatient   Does not apply q1800  . DISCONTD: furosemide  40 mg Intravenous BID  . DISCONTD: vancomycin  1,000 mg Intravenous Q24H   Continuous Infusions:   . heparin 1,100 Units/hr (06/24/12 2342)     Debbora Presto, MD  [facility] Pager [phone]  If 7PM-7AM, please contact night-coverage [email or web address] Password [email or web address] 06/25/2012, 5:41 AM   LOS: 4 days

## 2012-06-25 NOTE — Progress Notes (Signed)
Patient ID: [name] [name], female   DOB: 22-Oct-1945, 66 y.o.   MRN: [id number]  [facility] PROGRESS NOTE  ALILA SOTERO WUJ:[id number] DOB: 1945-09-11 DOA: 06/21/2012 PCP: No primary provider on file.  Brief narrative:  Pt is 66 yo female who was admitted for evaluation of progressively worsening shortness of breath associated with bilateral lower extremity edema and erythema, tenderness to palpation. 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  Principal Problem:  *Pedal edema  - physical exam suggestive of cellulitis but DVT findings also consistent with DVT in the right lower extremity  - continue Doxycycline and Heparin with transition to Coumadin - keep extremity elevated  Active Problems:  DVT in the lower extremity  - continue heparin with transition to Coumadin  Low grade fever - unclear etiology and CBC indicating normal WBC - UA and urine culture pending Acute renal failure  - unclear etiology at this time and possibly related to Lasix  - BMP indicating same creatine as yesterday - continue current dose of Lasix and check BNP in AM Hypoxemia  - likely secondary to acute CHF exacerbation and per 2 D ECHO this is combined systolic and diastolic CHF, 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% with grade II diastolic heart failure  - now resolved and no crackles on exam this AM  - no PE on CT angio  - monitor daily weights and I's/O's  Cellulitis  - continue Doxycycline PO  Anemia of chronic disease  - Hg and Hct are stable and at pt's baseline  - CBC in AM  CHF (congestive heart failure)  - pt has no known history of CHF but physical exam findings and imaging tests are suggestive of it  - 2 D ECHO indicating significantly reduced systolic function with grade II diastolic dysfunction  - continue Lasix  - daily weights, I's and O's  LBBB (left bundle branch block)  - monitor on telemetry  Dysphagia  - SLP evaluation  with no recommendations as pt seemed to have no difficulty swallowing during the evaluation   Consultants:  SLP  Procedures/Studies:  Dg Chest 2 View  06/21/2012  IMPRESSION:  No pulmonary edema. Bilateral small pleural effusion with bilateral basilar atelectasis or infiltrate.   Ct Angio Chest Pe W/cm &/or Wo Cm  06/21/2012  IMPRESSION:  1. No evidence of pulmonary embolus.  2. Large bilateral pleural effusions.  3. Ground-glass attenuation compatible with edema.  4. Airspace consolidation and collapse of the lung bases bilaterally, likely reflecting atelectasis.  5. Borderline right paratracheal lymph nodes are likely reactive.   Antibiotics:  Vancomycin 10/29 --> 1101  Doxycycline 11/01 -->   Code Status: Full  Family Communication: Pt at bedside  Disposition Plan: Home when INR therapeutic  HPI/Subjective: No events overnight.   Objective: Filed Vitals:   06/23/12 2036 06/24/12 0556 06/24/12 1500 06/24/12 2156  BP: 104/52 108/55 107/50 116/65  Pulse: 81 86 90 95  Temp: 99 F (37.2 C) 98.5 F (36.9 C) 97.2 F (36.2 C) 98 F (36.7 C)  TempSrc: Oral Oral Axillary Oral  Resp: 16 16 18 18   Height:      Weight:  85 kg (187 lb 6.3 oz)    SpO2: 97% 95% 94% 94%    Intake/Output Summary (Last 24 hours) at 06/25/12 0541 Last data filed at 06/24/12 2300  Gross per 24 hour  Intake 1223.75 ml  Output   1375 ml  Net -151.25 ml  Exam:   General:  Pt is alert, follows commands appropriately, not in acute distress  Cardiovascular: Regular rate and rhythm, S1/S2, no murmurs, no rubs, no gallops  Respiratory: Clear to auscultation bilaterally, no wheezing, no crackles, no rhonchi  Abdomen: Soft, non tender, non distended, bowel sounds present, no guarding  Extremities: Improving lower extremity edema, pulses DP and PT palpable bilaterally  Neuro: Grossly nonfocal  Data Reviewed: Basic Metabolic Panel:  BMET    Component Value Date/Time   NA 134*  06/25/2012 0553   K 4.2 06/25/2012 0553   CL 99 06/25/2012 0553   CO2 29 06/25/2012 0553   GLUCOSE 136* 06/25/2012 0553   BUN 27* 06/25/2012 0553   CREATININE 1.57* 06/25/2012 0553   CALCIUM 10.9* 06/25/2012 0553   GFRNONAA 34* 06/25/2012 0553   GFRAA 39* 06/25/2012 0553      Lab 06/24/12 0458 06/23/12 0540 06/21/12 1806  NA 134* 135 133*  K 4.2 3.8 3.8  CL 100 100 101  CO2 26 25 25   GLUCOSE 199* 140* 159*  BUN 23 17 12   CREATININE 1.55* 1.07 0.90  CALCIUM 10.9* 10.9* 11.0*  MG -- -- --  PHOS -- -- --   CBC    Component Value Date/Time   WBC 8.0 06/25/2012 0553   RBC 3.94 06/25/2012 0553   HGB 10.1* 06/25/2012 0553   HCT 32.2* 06/25/2012 0553   PLT 246 06/25/2012 0553     Lab 06/24/12 0458 06/23/12 0540 06/22/12 0855 06/21/12 1806  WBC 7.6 6.7 7.7 8.4  NEUTROABS -- -- -- 5.3  HGB 10.1* 10.6* 11.0* 11.5*  HCT 32.1* 33.6* 34.2* 36.0  MCV 82.7 82.4 81.8 81.8  PLT 244 269 253 282   Cardiac Enzymes:  Lab 06/22/12 1150 06/22/12 0500 06/21/12 2335  CKTOTAL -- -- --  CKMB -- -- --  CKMBINDEX -- -- --  TROPONINI <0.30 <0.30 <0.30    Scheduled Meds:   . aspirin EC  325 mg Oral Daily  . docusate sodium  100 mg Oral BID  . doxycycline  100 mg Oral Q12H  . furosemide  20 mg Intravenous BID  . sodium chloride  3 mL Intravenous Q12H  . sodium chloride  3 mL Intravenous Q12H  . warfarin  7.5 mg Oral ONCE-1800  . Warfarin - Pharmacist Dosing Inpatient   Does not apply q1800  . DISCONTD: furosemide  40 mg Intravenous BID  . DISCONTD: vancomycin  1,000 mg Intravenous Q24H   Continuous Infusions:   . heparin 1,100 Units/hr (06/24/12 2342)     Debbora Presto, MD  [facility] Pager [phone]  If 7PM-7AM, please contact night-coverage [email or web address] Password [email or web address] 06/25/2012, 5:41 AM   LOS: 4 days

## 2012-06-26 LAB — BASIC METABOLIC PANEL WITH GFR
BUN: 27 mg/dL — ABNORMAL HIGH (ref 6–23)
CO2: 29 meq/L (ref 19–32)
Calcium: 11.3 mg/dL — ABNORMAL HIGH (ref 8.4–10.5)
Chloride: 99 meq/L (ref 96–112)
Creatinine, Ser: 1.43 mg/dL — ABNORMAL HIGH (ref 0.50–1.10)
GFR calc Af Amer: 43 mL/min — ABNORMAL LOW
GFR calc non Af Amer: 38 mL/min — ABNORMAL LOW
Glucose, Bld: 173 mg/dL — ABNORMAL HIGH (ref 70–99)
Potassium: 4.2 meq/L (ref 3.5–5.1)
Sodium: 134 meq/L — ABNORMAL LOW (ref 135–145)

## 2012-06-26 LAB — HEPARIN LEVEL (UNFRACTIONATED)
Heparin Unfractionated: 0.19 [IU]/mL — ABNORMAL LOW (ref 0.30–0.70)
Heparin Unfractionated: 0.61 [IU]/mL (ref 0.30–0.70)

## 2012-06-26 LAB — CBC
Hemoglobin: 9.6 g/dL — ABNORMAL LOW (ref 12.0–15.0)
Platelets: 241 10*3/uL (ref 150–400)
RBC: 3.79 MIL/uL — ABNORMAL LOW (ref 3.87–5.11)
WBC: 7.2 10*3/uL (ref 4.0–10.5)

## 2012-06-26 LAB — PROTIME-INR
INR: 2.96 — ABNORMAL HIGH (ref 0.00–1.49)
Prothrombin Time: 29.3 seconds — ABNORMAL HIGH (ref 11.6–15.2)

## 2012-06-26 MED ORDER — WARFARIN SODIUM 2 MG PO TABS
2.0000 mg | ORAL_TABLET | Freq: Once | ORAL | Status: AC
  Administered 2012-06-26: 2 mg via ORAL
  Filled 2012-06-26: qty 1

## 2012-06-26 MED ORDER — HEPARIN (PORCINE) IN NACL 100-0.45 UNIT/ML-% IJ SOLN
1250.0000 [IU]/h | INTRAMUSCULAR | Status: DC
  Administered 2012-06-26 (×2): 1250 [IU]/h via INTRAVENOUS
  Filled 2012-06-26 (×3): qty 250

## 2012-06-26 MED ORDER — HEPARIN BOLUS VIA INFUSION
1000.0000 [IU] | Freq: Once | INTRAVENOUS | Status: AC
  Administered 2012-06-26: 1000 [IU] via INTRAVENOUS
  Filled 2012-06-26: qty 1000

## 2012-06-26 NOTE — Progress Notes (Signed)
ANTICOAGULATION CONSULT NOTE - Follow Up Consult  Pharmacy Consult for IV heparin, Warfarin Indication: new DVT  Allergies  Allergen Reactions  . Penicillins Anaphylaxis  . Sulfa Antibiotics Anaphylaxis   Patient Measurements: Height: 5\' 6"  (167.6 cm) Weight: 187 lb 6.3 oz (85 kg) IBW/kg (Calculated) : 59.3  Heparin Dosing Weight: 66 kg  Labs:  Basename 06/26/12 2214 06/26/12 1524 06/26/12 0533 06/25/12 0553 06/24/12 0458  HGB -- -- 9.6* 10.1* --  HCT -- -- 30.9* 32.2* 32.1*  PLT -- -- 241 246 244  APTT -- -- -- -- --  LABPROT -- -- 29.3* 18.9* 15.4*  INR -- -- 2.96* 1.64* 1.24  HEPARINUNFRC 0.53 0.61 0.19* -- --  CREATININE -- -- 1.43* 1.57* 1.55*  CKTOTAL -- -- -- -- --  CKMB -- -- -- -- --  TROPONINI -- -- -- -- --    Estimated Creatinine Clearance: 43.1 ml/min (by C-G formula based on Cr of 1.43).  Assessment:  23 yof presented 10/29 with BLE swelling x 2 weeks, mild DOE. IV heparin started for r/o DVT.  On 10/30, preliminary result with + DVT in posterior tibial vein, warfarin added.  Today is D#5 of IV heparin/ warfarin bridge.  INR with large increase to 2.96, therapeutic range.  Pt will need at least one more day of therapeutic INR before d/c'ing heparin.  Will give low dose warfarin today d/t large increase overnight.  IV heparin currently running at 1250 units/hr and heparin level = 0.58 (therapeutic x 2 consecutive levels)  No interruptions or complications noted  Pharmacy completed warfarin education.  Noted potential drug interaction with warfarin and doxycycline (may increase INR)  Goal of Therapy:  INR 2-3 Heparin level 0.3-0.7 units/ml Monitor platelets by anticoagulation protocol: Yes   Plan:   Continue IV heparin @ 1250 units/hr  Daily heparin level, CBC and PT/INR  Continue IV heparin/ warfarin bridge for at least 5 days AND until INR > 2 for 48 hours.  Terrilee Files, PharmD 06/26/2012 10:52 PM

## 2012-06-26 NOTE — Progress Notes (Addendum)
ANTICOAGULATION CONSULT NOTE - Follow Up Consult  Pharmacy Consult for IV heparin, Warfarin Indication: new DVT  Allergies  Allergen Reactions  . Penicillins Anaphylaxis  . Sulfa Antibiotics Anaphylaxis   Patient Measurements: Height: 5\' 6"  (167.6 cm) Weight: 187 lb 6.3 oz (85 kg) IBW/kg (Calculated) : 59.3  Heparin Dosing Weight: 66 kg  Labs:  Basename 06/26/12 0533 06/25/12 0553 06/24/12 0458  HGB 9.6* 10.1* --  HCT 30.9* 32.2* 32.1*  PLT 241 246 244  APTT -- -- --  LABPROT 29.3* 18.9* 15.4*  INR 2.96* 1.64* 1.24  HEPARINUNFRC 0.19* 0.64 0.61  CREATININE 1.43* 1.57* 1.55*  CKTOTAL -- -- --  CKMB -- -- --  TROPONINI -- -- --    Estimated Creatinine Clearance: 43.1 ml/min (by C-G formula based on Cr of 1.43).  Assessment:  77 yof presented 10/29 with BLE swelling x 2 weeks, mild DOE. IV heparin started for r/o DVT.  On 10/30, preliminary result with + DVT in posterior tibial vein, warfarin added.  Today is D#5 of IV heparin/ warfarin bridge.  INR with large increase to 2.96, therapeutic range.  Pt will need at least one more day of therapeutic INR before d/c'ing heparin.  Will give low dose warfarin today d/t large increase overnight.  IV heparin currently running at 1100 units/hr and heparin level has decreased to 0.19, subtherapeutic.  RN reports no interruptions or complications.  CBC stable; no bleeding/complications reported  Pharmacy completed warfarin education.  Noted potential drug interaction with warfarin and doxycycline (may increase INR)  Goal of Therapy:  INR 2-3 Heparin level 0.3-0.7 units/ml Monitor platelets by anticoagulation protocol: Yes   Plan:   Heparin 1000 unit IV bolus x1  Increase IV heparin to 1250 units/hr  Recheck HL in 6 hours  Warfarin 2 mg po x 1 tonight  Daily heparin level, CBC and PT/INR  Continue IV heparin/ warfarin bridge for at least 5 days AND until INR > 2 for 48 hours.  Lynann Beaver PharmD, BCPS Pager  [phone] 06/26/2012 9:03 AM    Addendum: 11/3 1500 Heparin level = 0.61, therapeutic Plan:  Continue  IV heparin at 1250 units/hr  Recheck HL in 6 hours, to confirm therapeutic level  Lynann Beaver PharmD, BCPS Pager [phone] 06/26/2012 4:30 PM

## 2012-06-26 NOTE — Progress Notes (Signed)
Patient ID: [name] [name], female   DOB: 30-Mar-1946, 66 y.o.   MRN: [id number]  [facility] PROGRESS NOTE  GUYLA BLESS MVH:[id number] DOB: September 15, 1945 DOA: 06/21/2012 PCP: No primary provider on file.  Brief narrative:  Pt is 66 yo female who was admitted for evaluation of progressively worsening shortness of breath associated with bilateral lower extremity edema and erythema, tenderness to palpation. 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   Principal Problem:  *Pedal edema  - physical exam suggestive of cellulitis but DVT findings also consistent with DVT in the right lower extremity  - swelling, erythema now significantly improved - continue Doxycycline and Heparin with transition to Coumadin  - keep extremity elevated  Active Problems:  DVT in the lower extremity  - continue heparin with transition to Coumadin  Low grade fever  - unclear etiology and CBC indicating normal WBC  - UA and urine culture pending  - afebrile over 24 hours - monitor vitals per floor protocol Acute renal failure  - unclear etiology at this time and possibly related to Lasix  - BMP indicating slight decrease in creatinine compared to yesterday - continue current dose of Lasix and check BNP in AM  Hypoxemia  - likely secondary to acute CHF exacerbation and per 2 D ECHO this is combined systolic and diastolic CHF, EF 13% with grade II diastolic heart failure  - weight fluctuation has been noted and initial weight of 150 lbs is likely an error - baseline weight appears to be 183 - 185 bs and this AM weight is up to 187 - no crackles on exam today but if weight increases by AM time will have to increase the dose of Lasix  - monitor daily weights and I's/O's  Cellulitis  - continue Doxycycline PO  Anemia of chronic disease  - Hg and Hct are stable and at pt's baseline  - CBC in AM  CHF (congestive heart failure)  -  pt has no known history of CHF but physical exam findings and imaging tests are suggestive of it  - 2 D ECHO indicating significantly reduced systolic function with grade II diastolic dysfunction  - continue Lasix  - daily weights, I's and O's  LBBB (left bundle branch block)  - monitor on telemetry  Dysphagia  - SLP evaluation with no recommendations as pt seemed to have no difficulty swallowing during the evaluation  Consultants:  SLP Procedures/Studies:  Dg Chest 2 View  06/21/2012  IMPRESSION:  No pulmonary edema. Bilateral small pleural effusion with bilateral basilar atelectasis or infiltrate.   Ct Angio Chest Pe W/cm &/or Wo Cm  06/21/2012  IMPRESSION:  1. No evidence of pulmonary embolus.  2. Large bilateral pleural effusions.  3. Ground-glass attenuation compatible with edema.  4. Airspace consolidation and collapse of the lung bases bilaterally, likely reflecting atelectasis.  5. Borderline right paratracheal lymph nodes are likely reactive.   Antibiotics:  Vancomycin 10/29 --> 1101  Doxycycline 11/01 -->  Code Status: Full  Family Communication: Pt at bedside  Disposition Plan: Home when INR therapeutic   HPI/Subjective: No events overnight. Pt denies shortness of breath or chest pain.  Objective: Filed Vitals:   06/25/12 1421 06/25/12 2101 06/26/12 0658 06/26/12 1331  BP: 106/64 115/69 108/54 119/77  Pulse: 86 94 82 90  Temp: 98.1 F (36.7 C) 98.6 F (37 C) 99.2 F (37.3 C) 98.2 F (36.8 C)  TempSrc: Oral Oral  Oral Oral  Resp: 20 18 16 16   Height:      Weight:   85 kg (187 lb 6.3 oz)   SpO2: 94% 98% 94% 95%    Intake/Output Summary (Last 24 hours) at 06/26/12 1504 Last data filed at 06/26/12 1300  Gross per 24 hour  Intake    467 ml  Output   1000 ml  Net   -533 ml    Exam:   General:  Pt is alert, follows commands appropriately, not in acute distress  Cardiovascular: Regular rate and rhythm, S1/S2, no murmurs, no rubs, no  gallops  Respiratory: Clear to auscultation bilaterally, with bibasilar crackels  Abdomen: Soft, non tender, non distended, bowel sounds present, no guarding  Extremities:  Edema much improved since yesterday, pulses DP and PT palpable bilaterally  Neuro: Grossly nonfocal  Data Reviewed: Basic Metabolic Panel:  Lab 06/26/12 1914 06/25/12 0553 06/24/12 0458 06/23/12 0540 06/21/12 1806  NA 134* 134* 134* 135 133*  K 4.2 4.2 4.2 3.8 3.8  CL 99 99 100 100 101  CO2 29 29 26 25 25   GLUCOSE 173* 136* 199* 140* 159*  BUN 27* 27* 23 17 12   CREATININE 1.43* 1.57* 1.55* 1.07 0.90  CALCIUM 11.3* 10.9* 10.9* 10.9* 11.0*  MG -- -- -- -- --  PHOS -- -- -- -- --   CBC:  Lab 06/26/12 0533 06/25/12 0553 06/24/12 0458 06/23/12 0540 06/22/12 0855 06/21/12 1806  WBC 7.2 8.0 7.6 6.7 7.7 --  NEUTROABS -- -- -- -- -- 5.3  HGB 9.6* 10.1* 10.1* 10.6* 11.0* --  HCT 30.9* 32.2* 32.1* 33.6* 34.2* --  MCV 81.5 81.7 82.7 82.4 81.8 --  PLT 241 246 244 269 253 --   Cardiac Enzymes:  Lab 06/22/12 1150 06/22/12 0500 06/21/12 2335  CKTOTAL -- -- --  CKMB -- -- --  CKMBINDEX -- -- --  TROPONINI <0.30 <0.30 <0.30   CBG:  Lab 06/25/12 2039  GLUCAP 183*   Scheduled Meds:   . aspirin EC  325 mg Oral Daily  . docusate sodium  100 mg Oral BID  . doxycycline  100 mg Oral Q12H  . furosemide  20 mg Intravenous BID  . warfarin  2 mg Oral ONCE-1800   Continuous Infusions:   . heparin 1,250 Units/hr (06/26/12 0948)  . [DISCONTINUED] heparin 1,100 Units/hr (06/26/12 0002)     Debbora Presto, MD  [facility] Pager [phone]  If 7PM-7AM, please contact night-coverage [email or web address] Password [email or web address] 06/26/2012, 3:04 PM   LOS: 5 days

## 2012-06-27 ENCOUNTER — Inpatient Hospital Stay (HOSPITAL_COMMUNITY): Payer: Medicare Other

## 2012-06-27 LAB — CBC
Hemoglobin: 9.8 g/dL — ABNORMAL LOW (ref 12.0–15.0)
MCV: 82.2 fL (ref 78.0–100.0)
Platelets: 219 10*3/uL (ref 150–400)
RBC: 3.77 MIL/uL — ABNORMAL LOW (ref 3.87–5.11)
WBC: 6.2 10*3/uL (ref 4.0–10.5)

## 2012-06-27 LAB — BASIC METABOLIC PANEL
Chloride: 100 mEq/L (ref 96–112)
GFR calc Af Amer: 43 mL/min — ABNORMAL LOW (ref >90)
GFR calc non Af Amer: 38 mL/min — ABNORMAL LOW (ref >90)
Glucose, Bld: 181 mg/dL — ABNORMAL HIGH (ref 70–99)
Potassium: 4.2 mEq/L (ref 3.5–5.1)
Sodium: 136 mEq/L (ref 135–145)

## 2012-06-27 LAB — HEMOGLOBIN A1C
Hgb A1c MFr Bld: 9 % — ABNORMAL HIGH (ref <5.7)
Mean Plasma Glucose: 212 mg/dL — ABNORMAL HIGH (ref <117)

## 2012-06-27 MED ORDER — WARFARIN SODIUM 4 MG PO TABS
4.0000 mg | ORAL_TABLET | Freq: Once | ORAL | Status: AC
  Administered 2012-06-27: 4 mg via ORAL
  Filled 2012-06-27: qty 1

## 2012-06-27 MED ORDER — FUROSEMIDE 10 MG/ML IJ SOLN
40.0000 mg | Freq: Two times a day (BID) | INTRAMUSCULAR | Status: DC
  Administered 2012-06-27 – 2012-07-08 (×22): 40 mg via INTRAVENOUS
  Filled 2012-06-27 (×29): qty 4

## 2012-06-27 MED ORDER — VITAMINS A & D EX OINT
TOPICAL_OINTMENT | CUTANEOUS | Status: AC
  Administered 2012-06-27: 14:00:00
  Filled 2012-06-27: qty 5

## 2012-06-27 NOTE — Progress Notes (Signed)
Patient ID: [name] [name], female   DOB: 1946-07-07, 66 y.o.   MRN: [id number]  [facility] PROGRESS NOTE  CAGNEY FALSO WUJ:[id number] DOB: 1945/12/15 DOA: 06/21/2012 PCP: No primary provider on file.  Brief narrative:  Pt is 66 yo female who was admitted for evaluation of progressively worsening shortness of breath associated with bilateral lower extremity edema and erythema, tenderness to palpation. 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   Principal Problem:  *Pedal edema  - physical exam suggestive of cellulitis but DVT findings also consistent with DVT in the right lower extremity  - swelling, erythema now significantly improved  - d/c Doxycycline today as pt has completed 7 days therapy - continue Coumadin Active Problems:  DVT in the lower extremity  - continue Coumadin  Low grade fever  - unclear etiology and CBC indicating normal WBC  - Urine culture pending  - monitor vitals per floor protocol  Acute renal failure  - unclear etiology at this time and possibly related to Lasix  - BMP indicating no change in creatinine compared to yesterday  - will increase the dose of Lasix as pt still has crackles on exam and weight is up from yesterday  - BMP in AM Hypoxemia  - likely secondary to acute CHF exacerbation and per 2 D ECHO this is combined systolic and diastolic CHF, EF 62% with grade II diastolic heart failure  - weight fluctuation has been noted and initial weight of 150 lbs is likely an error  - baseline weight appears to be 183 - 185 bs and this AM weight is up to 187  - increase the dose of Lasix  - monitor daily weights and I's/O's  Cellulitis  - discontinue Doxycycline PO  Anemia of chronic disease  - Hg and Hct are stable and at pt's baseline  - CBC in AM  CHF (congestive heart failure)  - pt has no known history of CHF but physical exam findings and imaging tests are  suggestive of it  - 2 D ECHO indicating significantly reduced systolic function with grade II diastolic dysfunction  - continue Lasix but will increase the dose slightly  - daily weights, I's and O's  LBBB (left bundle branch block)  - monitor on telemetry  Dysphagia  - SLP evaluation with no recommendations as pt seemed to have no difficulty swallowing during the evaluation  Hyperglycemia - will check A1C  Consultants:  SLP  Procedures/Studies:  Dg Chest Port 1 View 06/27/2012    IMPRESSION:  Increased bibasilar atelectasis.  Stable pleural effusions and vascular congestion.     Dg Chest 2 View  06/21/2012  IMPRESSION:  No pulmonary edema. Bilateral small pleural effusion with bilateral basilar atelectasis or infiltrate.   Ct Angio Chest Pe W/cm &/or Wo Cm  06/21/2012  IMPRESSION:  1. No evidence of pulmonary embolus.  2. Large bilateral pleural effusions.  3. Ground-glass attenuation compatible with edema.  4. Airspace consolidation and collapse of the lung bases bilaterally, likely reflecting atelectasis.  5. Borderline right paratracheal lymph nodes are likely reactive.   Antibiotics:  Vancomycin 10/29 --> 1101  Doxycycline 11/01 --> 11/04  Code Status: Full  Family Communication: Pt at bedside  Disposition Plan: Home in 1-2 days  HPI/Subjective: No events overnight. Pt denies shortness of breath or chest pain.  Objective: Filed Vitals:   06/26/12 0658 06/26/12 1331 06/26/12 2157 06/27/12 0545  BP: 108/54 119/77 125/62  112/68  Pulse: 82 90 90 84  Temp: 99.2 F (37.3 C) 98.2 F (36.8 C) 98.4 F (36.9 C) 98.2 F (36.8 C)  TempSrc: Oral Oral Oral Oral  Resp: 16 16 18 16   Height:      Weight: 85 kg (187 lb 6.3 oz)   84.8 kg (186 lb 15.2 oz)  SpO2: 94% 95% 94% 93%    Intake/Output Summary (Last 24 hours) at 06/27/12 1359 Last data filed at 06/27/12 1007  Gross per 24 hour  Intake 1443.96 ml  Output   1203 ml  Net 240.96 ml    Exam:   General:  Pt  is alert, follows commands appropriately, not in acute distress  Cardiovascular: Regular rate and rhythm, S1/S2, no murmurs, no rubs, no gallops  Respiratory: Clear to auscultation bilaterally with minimal bibasilar crackles   Abdomen: Soft, non tender, non distended, bowel sounds present, no guarding  Extremities: Improving bilateral lower extremity edema, pulses DP and PT palpable bilaterally  Neuro: Grossly nonfocal  Data Reviewed: Basic Metabolic Panel:  Lab 06/27/12 4098 06/26/12 0533 06/25/12 0553 06/24/12 0458 06/23/12 0540  NA 136 134* 134* 134* 135  K 4.2 4.2 4.2 4.2 3.8  CL 100 99 99 100 100  CO2 30 29 29 26 25   GLUCOSE 181* 173* 136* 199* 140*  BUN 28* 27* 27* 23 17  CREATININE 1.43* 1.43* 1.57* 1.55* 1.07  CALCIUM 11.5* 11.3* 10.9* 10.9* 10.9*  MG -- -- -- -- --  PHOS -- -- -- -- --   CBC:  Lab 06/27/12 0440 06/26/12 0533 06/25/12 0553 06/24/12 0458 06/23/12 0540 06/21/12 1806  WBC 6.2 7.2 8.0 7.6 6.7 --  NEUTROABS -- -- -- -- -- 5.3  HGB 9.8* 9.6* 10.1* 10.1* 10.6* --  HCT 31.0* 30.9* 32.2* 32.1* 33.6* --  MCV 82.2 81.5 81.7 82.7 82.4 --  PLT 219 241 246 244 269 --   Cardiac Enzymes:  Lab 06/22/12 1150 06/22/12 0500 06/21/12 2335  CKTOTAL -- -- --  CKMB -- -- --  CKMBINDEX -- -- --  TROPONINI <0.30 <0.30 <0.30   CBG:  Lab 06/25/12 2039  GLUCAP 183*    Recent Results (from the past 240 hour(s))  URINE CULTURE     Status: Normal (Preliminary result)   Collection Time   06/25/12  6:28 PM      Component Value Range Status Comment   Specimen Description URINE, CLEAN CATCH   Final    Special Requests NONE   Final    Culture  Setup Time 06/26/2012 15:42   Final    Colony Count >=100,000 COLONIES/ML   Final    Culture GRAM NEGATIVE RODS   Final    Report Status PENDING   Incomplete      Scheduled Meds:   . aspirin EC  325 mg Oral Daily  . docusate sodium  100 mg Oral BID  . doxycycline  100 mg Oral Q12H  . furosemide  20 mg Intravenous BID  .  warfarin  2 mg Oral ONCE-1800  . warfarin  4 mg Oral ONCE-1800   Continuous Infusions:   . [DISCONTINUED] heparin 1,250 Units/hr (06/26/12 1958)     Debbora Presto, MD  [facility] Pager [phone]  If 7PM-7AM, please contact night-coverage [email or web address] Password [email or web address] 06/27/2012, 1:59 PM   LOS: 6 days

## 2012-06-27 NOTE — Progress Notes (Signed)
ANTICOAGULATION CONSULT NOTE - Follow Up Consult  Pharmacy Consult for IV heparin, Warfarin Indication: new DVT  Allergies  Allergen Reactions  . Penicillins Anaphylaxis  . Sulfa Antibiotics Anaphylaxis   Patient Measurements: Height: 5\' 6"  (167.6 cm) Weight: 186 lb 15.2 oz (84.8 kg) IBW/kg (Calculated) : 59.3  Heparin Dosing Weight: 66 kg  Labs:  Basename 06/27/12 0440 06/26/12 2214 06/26/12 1524 06/26/12 0533 06/25/12 0553  HGB 9.8* -- -- 9.6* --  HCT 31.0* -- -- 30.9* 32.2*  PLT 219 -- -- 241 246  APTT -- -- -- -- --  LABPROT 25.6* -- -- 29.3* 18.9*  INR 2.47* -- -- 2.96* 1.64*  HEPARINUNFRC 0.44 0.53 0.61 -- --  CREATININE 1.43* -- -- 1.43* 1.57*  CKTOTAL -- -- -- -- --  CKMB -- -- -- -- --  TROPONINI -- -- -- -- --    Estimated Creatinine Clearance: 43 ml/min (by C-G formula based on Cr of 1.43).  Assessment:  46 yof presented 10/29 with BLE swelling x 2 weeks, mild DOE. IV heparin started for r/o DVT.  On 10/30, preliminary result with + DVT in posterior tibial vein, warfarin added.  Today is D#6 of IV heparin/ warfarin bridge.  INR therapeutic today.  Patient's INR increased significantly yesterday following 7.5 mg doses so will continue with a lower dose today.  Noted potential drug interaction with warfarin and doxycycline (may increase INR).  IV heparin currently running at 1250 units/hr and heparin level therapeutic.  Will be able to discontinue today.  No interruptions or complications noted.  CBC ok.  Pharmacy completed warfarin education.  Goal of Therapy:  INR 2-3 Heparin level 0.3-0.7 units/ml Monitor platelets by anticoagulation protocol: Yes   Plan:   Discontinue IV heparin now that INR>2 x 48 hours and have completed minimal 5 day overlap with warfarin.  Warfarin 4 mg po today.  Daily PT/INR  Clance Boll, PharmD, BCPS Pager: [phone] 06/27/2012 9:14 AM

## 2012-06-27 NOTE — Progress Notes (Signed)
Inpatient Diabetes Program Recommendations  AACE/ADA: New Consensus Statement on Inpatient Glycemic Control (2013)  Target Ranges:  Prepandial:   less than 140 mg/dL      Peak postprandial:   less than 180 mg/dL (1-2 hours)      Critically ill patients:  140 - 180 mg/dL  Results for LATIMA, COMMON (MRN [id number]) as of 06/27/2012 12:24  Ref. Range 06/21/2012 18:06 06/23/2012 05:40 06/24/2012 04:58 06/25/2012 05:53 06/26/2012 05:33 06/27/2012 04:40  Glucose Latest Range: 70-99 mg/dL 295 (H) 284 (H) 132 (H) 136 (H) 173 (H) 181 (H)     Inpatient Diabetes Program Recommendations Correction (SSI): Please consider ordering CBG checks ACHS and starting patient on Novolog sensitive correction if needed.  HgbA1C: Please consider obtaining A1C since patient's fasting CBG was 181 this am. Diet: Please consider changing diet to carb modified since fasting CBG 181 mg/dl.  Note: Patient does not have a diagnosed history of DM but fasting CBGs are elevated.  Please consider ordering A1C, checking CBGs ACHS, and starting on Novolog sensitive correction if needed.  Will continue to follow. Thanks, Orlando Penner, RN, BSN, CCRN Diabetes Coordinator Inpatient Diabetes Program [phone]

## 2012-06-28 LAB — BASIC METABOLIC PANEL
Calcium: 11.7 mg/dL — ABNORMAL HIGH (ref 8.4–10.5)
Creatinine, Ser: 1.42 mg/dL — ABNORMAL HIGH (ref 0.50–1.10)
GFR calc Af Amer: 44 mL/min — ABNORMAL LOW (ref >90)
GFR calc non Af Amer: 38 mL/min — ABNORMAL LOW (ref >90)

## 2012-06-28 LAB — CBC
MCH: 25.4 pg — ABNORMAL LOW (ref 26.0–34.0)
MCV: 82.4 fL (ref 78.0–100.0)
Platelets: 240 10*3/uL (ref 150–400)
RDW: 15.8 % — ABNORMAL HIGH (ref 11.5–15.5)

## 2012-06-28 LAB — URINE CULTURE

## 2012-06-28 LAB — PRO B NATRIURETIC PEPTIDE: Pro B Natriuretic peptide (BNP): 10162 pg/mL — ABNORMAL HIGH (ref 0–125)

## 2012-06-28 LAB — GLUCOSE, CAPILLARY
Glucose-Capillary: 198 mg/dL — ABNORMAL HIGH (ref 70–99)
Glucose-Capillary: 222 mg/dL — ABNORMAL HIGH (ref 70–99)

## 2012-06-28 MED ORDER — INSULIN ASPART 100 UNIT/ML ~~LOC~~ SOLN
0.0000 [IU] | Freq: Three times a day (TID) | SUBCUTANEOUS | Status: DC
  Administered 2012-06-30 – 2012-07-01 (×4): 2 [IU] via SUBCUTANEOUS
  Administered 2012-07-01: 5 [IU] via SUBCUTANEOUS
  Administered 2012-07-01: 1 [IU] via SUBCUTANEOUS
  Administered 2012-07-02: 3 [IU] via SUBCUTANEOUS
  Administered 2012-07-02: 1 [IU] via SUBCUTANEOUS
  Administered 2012-07-02: 2 [IU] via SUBCUTANEOUS
  Administered 2012-07-03 (×2): 3 [IU] via SUBCUTANEOUS
  Administered 2012-07-03 – 2012-07-04 (×2): 2 [IU] via SUBCUTANEOUS
  Administered 2012-07-04: 5 [IU] via SUBCUTANEOUS
  Administered 2012-07-04: 3 [IU] via SUBCUTANEOUS
  Administered 2012-07-05: 1 [IU] via SUBCUTANEOUS
  Administered 2012-07-05 – 2012-07-06 (×2): 2 [IU] via SUBCUTANEOUS
  Administered 2012-07-06: 3 [IU] via SUBCUTANEOUS
  Administered 2012-07-07: 1 [IU] via SUBCUTANEOUS
  Administered 2012-07-07 (×2): 2 [IU] via SUBCUTANEOUS
  Administered 2012-07-08 (×2): 3 [IU] via SUBCUTANEOUS

## 2012-06-28 MED ORDER — LEVOFLOXACIN 500 MG PO TABS
500.0000 mg | ORAL_TABLET | Freq: Every day | ORAL | Status: AC
  Administered 2012-06-28 – 2012-06-30 (×3): 500 mg via ORAL
  Filled 2012-06-28 (×3): qty 1

## 2012-06-28 MED ORDER — WARFARIN SODIUM 4 MG PO TABS
4.0000 mg | ORAL_TABLET | Freq: Once | ORAL | Status: AC
  Administered 2012-06-28: 4 mg via ORAL
  Filled 2012-06-28: qty 1

## 2012-06-28 MED ORDER — METFORMIN HCL 500 MG PO TABS: 500.0000 mg | ORAL_TABLET | Freq: Two times a day (BID) | ORAL | Status: DC

## 2012-06-28 NOTE — Progress Notes (Addendum)
ANTICOAGULATION CONSULT NOTE - Follow Up Consult  Pharmacy Consult for Warfarin Indication: new DVT  Allergies  Allergen Reactions  . Penicillins Anaphylaxis  . Sulfa Antibiotics Anaphylaxis   Patient Measurements: Height: 5\' 6"  (167.6 cm) Weight: 182 lb 1.6 oz (82.6 kg) IBW/kg (Calculated) : 59.3   Labs:  Basename 06/28/12 0455 06/27/12 0440 06/26/12 2214 06/26/12 1524 06/26/12 0533  HGB 10.0* 9.8* -- -- --  HCT 32.4* 31.0* -- -- 30.9*  PLT 240 219 -- -- 241  APTT -- -- -- -- --  LABPROT 22.7* 25.6* -- -- 29.3*  INR 2.10* 2.47* -- -- 2.96*  HEPARINUNFRC -- 0.44 0.53 0.61 --  CREATININE 1.42* 1.43* -- -- 1.43*  CKTOTAL -- -- -- -- --  CKMB -- -- -- -- --  TROPONINI -- -- -- -- --    Estimated Creatinine Clearance: 42.8 ml/min (by C-G formula based on Cr of 1.42).  Assessment:  89 yof presented 10/29 with BLE swelling x 2 weeks, mild DOE. IV heparin started for r/o DVT.  On 10/30, preliminary result with + DVT in posterior tibial vein, warfarin added.  Patient completed minimal heparin/warfarin bridge 11/4.    INR therapeutic today.   Doxycycline treatment ended 11/4.  Noted potential drug interaction with warfarin (may increase INR).  Pharmacy completed warfarin education.  Goal of Therapy:  INR 2-3   Plan:   Repeat warfarin 4 mg po today.  Daily PT/INR  Clance Boll, PharmD, BCPS Pager: [phone] 06/28/2012 8:01 AM

## 2012-06-28 NOTE — Progress Notes (Signed)
PHARMACIST - PHYSICIAN COMMUNICATION DR:  Izola Price CONCERNING:  METFORMIN SAFE ADMINISTRATION POLICY  RECOMMENDATION: Metformin has been placed on DISCONTINUE (rejected order) STATUS and should be reordered only after any of the conditions below are ruled out.  Current safety recommendations include avoiding metformin for a minimum of 48 hours after the patient's exposure to intravenous contrast media.  DESCRIPTION:  The Pharmacy Committee has adopted a policy that restricts the use of metformin in hospitalized patients until all the contraindications to administration have been ruled out. Specific contraindications are: []  Serum creatinine ? 1.5 for males [x]  Serum creatinine ? 1.4 for females []  Shock, acute MI, sepsis, hypoxemia, dehydration []  Planned administration of intravenous iodinated contrast media []  Heart Failure patients with low EF []  Acute or chronic metabolic acidosis (including DKA)      Scr = 1.42 (11/5)  Per FDA recommendations, metformin can safely be resumed once Scr falls below 1.4.  Thank you, Terrilee Files, PharmD

## 2012-06-28 NOTE — Progress Notes (Signed)
Patient ID: [name] [name], female   DOB: April 19, 1946, 66 y.o.   MRN: [id number]  [facility] PROGRESS NOTE  [name] [name]:[id number] DOB: [birth year] DOA: 06/21/2012 PCP: No primary provider on file.  Brief narrative:  Pt is 66 yo female who was admitted for evaluation of progressively worsening shortness of breath associated with bilateral lower extremity edema and erythema, tenderness to palpation. 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 In addition, A1C 9 indicated diabetes and what was thought to be newly diagnosed. However, pt report knowing she has had ? Diabetes but has never been treated for it.   Principal Problem:  *Pedal edema  - physical exam suggestive of cellulitis but doppler findings also consistent with DVT in the right lower extremity, CHF also contributing - swelling, erythema now significantly improved  - pt has completed 7 days therapy of Doxycycline and is currently on Coumadin for DVT Active Problems:  DVT in the lower extremity  - continue Coumadin per pharmacy Low grade fever  - unclear etiology and CBC indicating normal WBC  - Urine culture back and indicating E. Coli pan sensitive but pt report intolerance to Cipro and Sulfa ABX - will treat her Levaquin for 3 days - monitor vitals per floor protocol  Acute renal failure  - this is likely chronic in etiology as well and baseline like appears to be ~ 1.4 - BMP indicating no change in creatinine compared to yesterday  - continue current dose of Lasix as pt is diuresing well - BMP in AM Hypoxemia secondary to combined CHF exacerbation  - likely secondary to acute CHF exacerbation and per 2 D ECHO this is combined systolic and diastolic CHF, EF 62% with grade II diastolic heart failure  - pt has refused to have cardiology see her in the hospital and prefers to follow with cardiologist in an outpatient setting - weight  fluctuation has been noted and initial weight of 150 lbs is likely an error  - baseline weight appears to be 183 - 185 lbs - dose of Lasix was increased to 40 mg IV and pt has diuresed well, weight is now 187 --> 182 lbs this AM - plan on changing Lasix to PO in AM - monitor daily weights and I's/O's  Hypercalcemia - possibly related to Lasix use - BMP in AM Diabetes mellitus with complications of neuropathy - this 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 but pt apparently aware of possible diabetes, has never been treated and I am not clear if this is mostly non compliance issue - place order for diabetic educator - unable to start Metformin due to renal failure but will initiate SSI, novolog for now - pt has refused insulin on discharge and reports not tolerating Glipizide - will follow upon diabetic educator recommendations  Cellulitis  - discontinues Doxycycline PO as pt has completed the therapy Anemia of chronic disease  - Hg and Hct are stable and at pt's baseline  - CBC in AM  LBBB (left bundle branch block)  - monitor on telemetry  Dysphagia  - SLP evaluation with no recommendations as pt seemed to have no difficulty swallowing during the evaluation   Consultants:  SLP Procedures/Studies:  Dg Chest Port 1 View  06/27/2012  IMPRESSION:  Increased bibasilar atelectasis. Stable pleural effusions and vascular congestion.  Dg Chest 2 View  06/21/2012  IMPRESSION:  No pulmonary edema. Bilateral small pleural effusion with bilateral basilar atelectasis  or infiltrate.  Ct Angio Chest Pe W/cm &/or Wo Cm  06/21/2012  IMPRESSION:  1. No evidence of pulmonary embolus.  2. Large bilateral pleural effusions.  3. Ground-glass attenuation compatible with edema.  4. Airspace consolidation and collapse of the lung bases bilaterally, likely reflecting atelectasis.  5. Borderline right paratracheal lymph nodes are likely reactive.   Antibiotics:  Vancomycin 10/29 --> 1101  Doxycycline 11/01 -->  11/04 Levaquin for UTI 11/05 --> 11/08  Code Status: Full  Family Communication: Pt at bedside  Disposition Plan: Home in 1-2 days   HPI/Subjective: No events overnight.   Objective: Filed Vitals:   06/27/12 1513 06/27/12 2151 06/28/12 0408 06/28/12 1504  BP: 116/69 122/71 119/63 110/59  Pulse: 93 93 93 87  Temp: 98.1 F (36.7 C) 98.5 F (36.9 C) 98.8 F (37.1 C) 98.5 F (36.9 C)  TempSrc: Oral Oral Oral Oral  Resp: 16 16 16 16   Height:      Weight:   82.6 kg (182 lb 1.6 oz)   SpO2: 94% 95% 95% 90%    Intake/Output Summary (Last 24 hours) at 06/28/12 1654 Last data filed at 06/28/12 1134  Gross per 24 hour  Intake    580 ml  Output   3620 ml  Net  -3040 ml    Exam:   General:  Pt is alert, follows commands appropriately, not in acute distress  Cardiovascular: Regular rate and rhythm, S1/S2, no murmurs, no rubs, no gallops  Respiratory: Clear to auscultation bilaterally, no wheezing, no crackles, no rhonchi, decreased breath sounds at bases  Abdomen: Soft, non tender, non distended, bowel sounds present, no guarding  Extremities: +1 bilateral lower extremity pitting edema, pulses DP and PT palpable bilaterally  Neuro: Grossly nonfocal  Data Reviewed: Basic Metabolic Panel:  Lab 06/28/12 4098 06/27/12 0440 06/26/12 0533 06/25/12 0553 06/24/12 0458  NA 135 136 134* 134* 134*  K 4.0 4.2 4.2 4.2 4.2  CL 97 100 99 99 100  CO2 30 30 29 29 26   GLUCOSE 162* 181* 173* 136* 199*  BUN 28* 28* 27* 27* 23  CREATININE 1.42* 1.43* 1.43* 1.57* 1.55*  CALCIUM 11.7* 11.5* 11.3* 10.9* 10.9*  MG -- -- -- -- --  PHOS -- -- -- -- --   CBC:  Lab 06/28/12 0455 06/27/12 0440 06/26/12 0533 06/25/12 0553 06/24/12 0458 06/21/12 1806  WBC 7.4 6.2 7.2 8.0 7.6 --  NEUTROABS -- -- -- -- -- 5.3  HGB 10.0* 9.8* 9.6* 10.1* 10.1* --  HCT 32.4* 31.0* 30.9* 32.2* 32.1* --  MCV 82.4 82.2 81.5 81.7 82.7 --  PLT 240 219 241 246 244 --   Cardiac Enzymes:  Lab 06/22/12 1150 06/22/12  0500 06/21/12 2335  CKTOTAL -- -- --  CKMB -- -- --  CKMBINDEX -- -- --  TROPONINI <0.30 <0.30 <0.30   CBG:  Lab 06/25/12 2039  GLUCAP 183*    Recent Results (from the past 240 hour(s))  URINE CULTURE     Status: Normal   Collection Time   06/25/12  6:28 PM      Component Value Range Status Comment   Specimen Description URINE, CLEAN CATCH   Final    Special Requests NONE   Final    Culture  Setup Time 06/26/2012 15:42   Final    Colony Count >=100,000 COLONIES/ML   Final    Culture ESCHERICHIA COLI   Final    Report Status 06/28/2012 FINAL   Final    Organism ID,  Bacteria ESCHERICHIA COLI   Final      Scheduled Meds:   . aspirin EC  325 mg Oral Daily  . docusate sodium  100 mg Oral BID  . furosemide  40 mg Intravenous BID  . [COMPLETED] warfarin  4 mg Oral ONCE-1800  . warfarin  4 mg Oral ONCE-1800   Continuous Infusions:    Debbora Presto, MD  [facility] Pager [phone]  If 7PM-7AM, please contact night-coverage [email or web address] Password [email or web address] 06/28/2012, 4:54 PM   LOS: 7 days

## 2012-06-29 ENCOUNTER — Encounter (HOSPITAL_COMMUNITY): Payer: Self-pay | Admitting: Cardiology

## 2012-06-29 DIAGNOSIS — I255 Ischemic cardiomyopathy: Secondary | ICD-10-CM | POA: Diagnosis present

## 2012-06-29 DIAGNOSIS — M79609 Pain in unspecified limb: Secondary | ICD-10-CM

## 2012-06-29 DIAGNOSIS — I739 Peripheral vascular disease, unspecified: Secondary | ICD-10-CM | POA: Insufficient documentation

## 2012-06-29 DIAGNOSIS — M7989 Other specified soft tissue disorders: Secondary | ICD-10-CM

## 2012-06-29 DIAGNOSIS — I5041 Acute combined systolic (congestive) and diastolic (congestive) heart failure: Secondary | ICD-10-CM

## 2012-06-29 DIAGNOSIS — I82409 Acute embolism and thrombosis of unspecified deep veins of unspecified lower extremity: Secondary | ICD-10-CM

## 2012-06-29 DIAGNOSIS — E119 Type 2 diabetes mellitus without complications: Secondary | ICD-10-CM | POA: Diagnosis present

## 2012-06-29 DIAGNOSIS — I2699 Other pulmonary embolism without acute cor pulmonale: Secondary | ICD-10-CM

## 2012-06-29 DIAGNOSIS — R200 Anesthesia of skin: Secondary | ICD-10-CM

## 2012-06-29 DIAGNOSIS — I5021 Acute systolic (congestive) heart failure: Secondary | ICD-10-CM | POA: Diagnosis present

## 2012-06-29 DIAGNOSIS — I447 Left bundle-branch block, unspecified: Secondary | ICD-10-CM

## 2012-06-29 HISTORY — DX: Dilated cardiomyopathy: I25.5

## 2012-06-29 HISTORY — DX: Acute embolism and thrombosis of unspecified deep veins of unspecified lower extremity: I82.409

## 2012-06-29 HISTORY — DX: Anesthesia of skin: R20.0

## 2012-06-29 LAB — BASIC METABOLIC PANEL
CO2: 33 mEq/L — ABNORMAL HIGH (ref 19–32)
Calcium: 11.8 mg/dL — ABNORMAL HIGH (ref 8.4–10.5)
GFR calc non Af Amer: 35 mL/min — ABNORMAL LOW (ref >90)
Glucose, Bld: 129 mg/dL — ABNORMAL HIGH (ref 70–99)
Potassium: 3.9 mEq/L (ref 3.5–5.1)
Sodium: 135 mEq/L (ref 135–145)

## 2012-06-29 LAB — GLUCOSE, CAPILLARY
Glucose-Capillary: 145 mg/dL — ABNORMAL HIGH (ref 70–99)
Glucose-Capillary: 123 mg/dL — ABNORMAL HIGH (ref 70–99)

## 2012-06-29 LAB — CBC
Hemoglobin: 10.2 g/dL — ABNORMAL LOW (ref 12.0–15.0)
MCH: 25.7 pg — ABNORMAL LOW (ref 26.0–34.0)
MCHC: 31.5 g/dL (ref 30.0–36.0)
Platelets: 254 10*3/uL (ref 150–400)
RBC: 3.97 MIL/uL (ref 3.87–5.11)

## 2012-06-29 LAB — PROTIME-INR
INR: 2.14 — ABNORMAL HIGH (ref 0.00–1.49)
Prothrombin Time: 23 seconds — ABNORMAL HIGH (ref 11.6–15.2)

## 2012-06-29 MED ORDER — CARVEDILOL 3.125 MG PO TABS
3.1250 mg | ORAL_TABLET | Freq: Two times a day (BID) | ORAL | Status: DC
  Administered 2012-06-30 – 2012-07-07 (×14): 3.125 mg via ORAL
  Filled 2012-06-29 (×18): qty 1

## 2012-06-29 MED ORDER — ASPIRIN EC 81 MG PO TBEC
81.0000 mg | DELAYED_RELEASE_TABLET | Freq: Every day | ORAL | Status: DC
  Administered 2012-06-30 – 2012-07-04 (×4): 81 mg via ORAL
  Filled 2012-06-29 (×6): qty 1

## 2012-06-29 MED ORDER — SPIRONOLACTONE 12.5 MG HALF TABLET
12.5000 mg | ORAL_TABLET | Freq: Two times a day (BID) | ORAL | Status: DC
  Administered 2012-06-29 – 2012-07-08 (×18): 12.5 mg via ORAL
  Filled 2012-06-29 (×24): qty 1

## 2012-06-29 MED ORDER — ENSURE COMPLETE PO LIQD
237.0000 mL | Freq: Two times a day (BID) | ORAL | Status: DC
  Administered 2012-06-30 – 2012-07-09 (×16): 237 mL via ORAL

## 2012-06-29 MED ORDER — WARFARIN SODIUM 4 MG PO TABS
4.0000 mg | ORAL_TABLET | Freq: Once | ORAL | Status: AC
  Administered 2012-06-29: 4 mg via ORAL
  Filled 2012-06-29: qty 1

## 2012-06-29 MED ORDER — POLYETHYLENE GLYCOL 3350 17 G PO PACK
17.0000 g | PACK | Freq: Every day | ORAL | Status: DC
  Administered 2012-06-29 – 2012-07-06 (×4): 17 g via ORAL
  Filled 2012-06-29 (×10): qty 1

## 2012-06-29 MED ORDER — METOPROLOL TARTRATE 25 MG PO TABS
25.0000 mg | ORAL_TABLET | Freq: Two times a day (BID) | ORAL | Status: DC
  Administered 2012-06-29: 25 mg via ORAL
  Filled 2012-06-29 (×2): qty 1

## 2012-06-29 MED ORDER — ATORVASTATIN CALCIUM 10 MG PO TABS
10.0000 mg | ORAL_TABLET | Freq: Every day | ORAL | Status: DC
  Administered 2012-06-29 – 2012-07-08 (×9): 10 mg via ORAL
  Filled 2012-06-29 (×12): qty 1

## 2012-06-29 MED ORDER — BISACODYL 10 MG RE SUPP
10.0000 mg | Freq: Every day | RECTAL | Status: DC | PRN
  Administered 2012-06-30: 10 mg via RECTAL
  Filled 2012-06-29: qty 1

## 2012-06-29 NOTE — Progress Notes (Signed)
Received report from [place] from 4th floor and patient arrived on floor at 2340. Pt is comfortable. Will continue to monitor.

## 2012-06-29 NOTE — Progress Notes (Signed)
[facility]             Progress Note   Subjective: Appears overwhelmed. C/o pain in right foot. Daughter Marcelino Duster present and updated on plan of care.  Objective: Vital signs in last 24 hours: Temp:  [98.2 F (36.8 C)-98.6 F (37 C)] 98.6 F (37 C) (11/06 0641) Pulse Rate:  [76-90] 76  (11/06 0641) Resp:  [16-18] 18  (11/06 0641) BP: (109-123)/(54-71) 109/71 mmHg (11/06 0641) SpO2:  [90 %-97 %] 92 % (11/06 0641) Weight change:  Last BM Date: 06/21/12  Intake/Output from previous day: 11/05 0701 - 11/06 0700 In: 960 [P.O.:960] Out: 3425 [Urine:3425] Total I/O In: 240 [P.O.:240] Out: 1375 [Urine:1375]   Physical Exam: General: Alert, awake, oriented x3. HEENT: No bruits, no goiter. Heart: Regular rate and rhythm, without murmurs, rubs, gallops. Lungs: Mild bibasilar crackles. Abdomen: Soft, nontender, nondistended, positive bowel sounds. Extremities: 2+ edema to midshins bilaterally. No palpable pulse (or by doppler) to right foot that is cold to touch. Neuro: Grossly intact, nonfocal.    Lab Results: Basic Metabolic Panel:  Basename 06/29/12 0448 06/28/12 0455  NA 135 135  K 3.9 4.0  CL 96 97  CO2 33* 30  GLUCOSE 129* 162*  BUN 30* 28*  CREATININE 1.51* 1.42*  CALCIUM 11.8* 11.7*  MG -- --  PHOS -- --   CBC:  Basename 06/29/12 0448 06/28/12 0455  WBC 7.5 7.4  NEUTROABS -- --  HGB 10.2* 10.0*  HCT 32.4* 32.4*  MCV 81.6 82.4  PLT 254 240   BNP:  Basename 06/28/12 0455 06/27/12 0440  PROBNP 10162.0* 8045.0*   CBG:  Basename 06/29/12 1132 06/29/12 0730 06/28/12 2148 06/28/12 1748  GLUCAP 156* 145* 222* 198*   Hemoglobin A1C:  Basename 06/27/12 1430  HGBA1C 9.0*   Coagulation:  Basename 06/29/12 0448 06/28/12 0455  LABPROT 23.0* 22.7*  INR 2.14* 2.10*    Recent Results (from the past 240 hour(s))  URINE CULTURE     Status: Normal   Collection Time   06/25/12  6:28 PM      Component Value Range Status Comment   Specimen Description URINE, CLEAN CATCH   Final    Special Requests NONE   Final    Culture  Setup Time 06/26/2012 15:42   Final    Colony Count >=100,000 COLONIES/ML   Final    Culture ESCHERICHIA COLI   Final    Report Status 06/28/2012 FINAL   Final    Organism ID, Bacteria ESCHERICHIA COLI   Final     Studies/Results: No results found.  Medications: Scheduled Meds:    . aspirin EC  325 mg Oral Daily  . atorvastatin  10 mg Oral q1800  . docusate sodium  100 mg Oral BID  . furosemide  40 mg Intravenous BID  . insulin aspart  0-9 Units Subcutaneous TID WC  . levofloxacin  500 mg Oral Daily  . metoprolol tartrate  25 mg Oral BID  . sodium chloride  3 mL Intravenous Q12H  . sodium chloride  3 mL Intravenous Q12H  . [COMPLETED] warfarin  4 mg Oral ONCE-1800  . warfarin  4 mg Oral ONCE-1800  . Warfarin - Pharmacist Dosing Inpatient   Does not apply q1800  . [DISCONTINUED] metFORMIN  500 mg Oral BID WC   Continuous Infusions:  PRN Meds:.sodium chloride, morphine injection, ondansetron (ZOFRAN) IV, ondansetron, oxyCODONE-acetaminophen, sodium chloride  Assessment/Plan:  Principal Problem:  *Acute combined systolic and diastolic CHF, NYHA class 2 Active  Problems:  Hypoxemia  Pedal edema  Cellulitis  CHF (congestive heart failure)  LBBB (left bundle branch block)  DVT (deep venous thrombosis)  DM (diabetes mellitus)  PVD (peripheral vascular disease)  CKD (chronic kidney disease) stage 3, GFR 30-59 ml/min   Acute Combined CHF -New diagnosis. -EF 25% per ECHO -Patient is now agreeable to cardiology consultation. Placed consult to [facility]. -Continue lasix at current dose. -She is about 8 L negative since admission. -On ASA. -Will add metoprolol and statin. -No ACE-I given renal failure.  Acute Bilateral LE DVT -On coumadin. -INR therapeutic. -Pharmacy dosing.  Probable PVD -Unable to palpate pulses RLE, RN unable to doppler a pulse. -She complains of pain to that  foot. -She is high risk for PVD with her uncontrolled DM, smoking history and CHF. -Unable to do ABIs given her acute DVTs. -Will ask vascular surgery to see.  CKD Stage III -Her baseline creatinine appears to be in the 1.4-1.5 range. -Likely related to long-time uncontrolled DM.  DM -Patient had been refusing insulin. -She is now agreeable. -Continue to adjust regimen as needed.  E Coli UTI -Continue levaquin for 2 more days.   Time spent coordinating care: 45 minutes   LOS: 8 days   HERNANDEZ ACOSTA,ESTELA [facility] Pager: [phone] 06/29/2012, 2:41 PM

## 2012-06-29 NOTE — Consult Note (Signed)
Reason for Consult: CHF, combined systolic and diastolic, new EF 14%  Referring Physician: Dr. Loreta Ave  Cardiology: [facility]   [name] is an 66 y.o. female.    Chief Complaint:  Admitted on 06/21/12 with leg swelling and pain.   HPI: [name] [name] is an 66 y.o. female with no significant medical problem but had not seen PCP for over 30 years, presented to ER 06/21/12 with bilateral lower extremity edema for 2 weeks, with mild DOE, but no chest pain. She has "sprained" her right ankle three months ago. The legs started to swell with increased erythema and calf tenderness. She admitted to loud snorning, but denied any exertional chest pain, pleuritic CP, fever, chill, or cough. She was a smoker, but no longer smokes. Evaluation in the ER with ABG on RA showing 7.4/37/paO2 67. This gives a a-A gradient of 36 with expected gradient of only 20. Her WBC was normal, Hb 11.4. Her Calcium is slightly elevated to 11. Her Na was 133, and she has normal renal fx tests. Her CXR showed mild pleural effusions bilaterally, and mild cardiomegaly, but no edema. Her EKG showed LBBB with no old to compare.  She was given 20mg  of IV lasix and hospitalist admitted.  Troponin I negative X 3, Pro BNP on admit 9,636 currently 10.162.  She was found to have bil DVTs by doppler:acute deep vein thrombosis involving the right posterior tibial vein. An enlarged inguinal lymph node is noted on the right. - Findings consistent with acute deep vein thrombosis involving the left profunda vein.  She was on coumadin Heparin crossover.    2D ECHO done on 06/23/12 revealed: Left ventricle: The cavity size was mildly dilated. Wall thickness was normal. The estimated ejection fraction was 25%. Diffuse hypokinesis. Septal-lateral dyssynchrony. Features are consistent with a pseudonormal left ventricular filling pattern, with concomitant abnormal relaxation and increased filling pressure (grade 2 diastolic dysfunction). - Aortic  valve: There was no stenosis. - Mitral valve: Moderately calcified annulus. Trivial regurgitation. - Left atrium: The atrium was mildly dilated. - Right ventricle: The cavity size was normal. Systolic function was normal. - Pulmonary arteries: No complete TR doppler jet so unable to estimate PA systolic pressure. - Systemic veins: IVC measured 2.0 cm with some respirophasic variation, suggesting RA pressure 10 mmHg. - Pericardium, extracardiac: Small circumferential pericardial effusion.  EKG SR with PACs and LBBB no old EKG to compare.  Pt has been diuresed about 8L negative since admit.  Her INR is therapeutic.   Also Rt foot pain and artrial dopplers reveal occlusion of Rt. Popliteal artery, very limited recanalized flow is visualized in the mid to distal right posterior tibial and anterior tibial arteries.    Past Medical History  Diagnosis Date  . Cardiomyopathy, EF 25% 06/29/2012    Past Surgical History  Procedure Date  . Abdominal hysterectomy   . Cesarean section   . Back surgery     Family History  Problem Relation Age of Onset  . Cancer Mother   . CAD Mother   . Cancer Father   . CAD Father    Social History:  reports that she has never smoked. She has never used smokeless tobacco. She reports that she does not drink alcohol or use illicit drugs. Widowed.   Allergies:  Allergies  Allergen Reactions  . Penicillins Anaphylaxis  . Sulfa Antibiotics Anaphylaxis    Medications Prior to Admission  Medication Sig Dispense Refill  . ibuprofen (ADVIL,MOTRIN) 200 MG tablet Take 200  mg by mouth every 6 (six) hours as needed. Pain      . Multiple Vitamin (MULTIVITAMIN) LIQD Take 5 mLs by mouth daily.      Marland Kitchen triamcinolone ointment (KENALOG) 0.5 % Apply 1 application topically 2 (two) times daily.        Results for orders placed during the hospital encounter of 06/21/12 (from the past 48 hour(s))  PROTIME-INR     Status: Abnormal   Collection Time   06/28/12   4:55 AM      Component Value Range Comment   Prothrombin Time 22.7 (*) 11.6 - 15.2 seconds    INR 2.10 (*) 0.00 - 1.49   CBC     Status: Abnormal   Collection Time   06/28/12  4:55 AM      Component Value Range Comment   WBC 7.4  4.0 - 10.5 K/uL    RBC 3.93  3.87 - 5.11 MIL/uL    Hemoglobin 10.0 (*) 12.0 - 15.0 g/dL    HCT 16.1 (*) 09.6 - 46.0 %    MCV 82.4  78.0 - 100.0 fL    MCH 25.4 (*) 26.0 - 34.0 pg    MCHC 30.9  30.0 - 36.0 g/dL    RDW 04.5 (*) 40.9 - 15.5 %    Platelets 240  150 - 400 K/uL   BASIC METABOLIC PANEL     Status: Abnormal   Collection Time   06/28/12  4:55 AM      Component Value Range Comment   Sodium 135  135 - 145 mEq/L    Potassium 4.0  3.5 - 5.1 mEq/L    Chloride 97  96 - 112 mEq/L    CO2 30  19 - 32 mEq/L    Glucose, Bld 162 (*) 70 - 99 mg/dL    BUN 28 (*) 6 - 23 mg/dL    Creatinine, Ser 8.11 (*) 0.50 - 1.10 mg/dL    Calcium 91.4 (*) 8.4 - 10.5 mg/dL    GFR calc non Af Amer 38 (*) >90 mL/min    GFR calc Af Amer 44 (*) >90 mL/min   PRO B NATRIURETIC PEPTIDE     Status: Abnormal   Collection Time   06/28/12  4:55 AM      Component Value Range Comment   Pro B Natriuretic peptide (BNP) 10162.0 (*) 0 - 125 pg/mL   GLUCOSE, CAPILLARY     Status: Abnormal   Collection Time   06/28/12  5:48 PM      Component Value Range Comment   Glucose-Capillary 198 (*) 70 - 99 mg/dL   GLUCOSE, CAPILLARY     Status: Abnormal   Collection Time   06/28/12  9:48 PM      Component Value Range Comment   Glucose-Capillary 222 (*) 70 - 99 mg/dL   PROTIME-INR     Status: Abnormal   Collection Time   06/29/12  4:48 AM      Component Value Range Comment   Prothrombin Time 23.0 (*) 11.6 - 15.2 seconds    INR 2.14 (*) 0.00 - 1.49   CBC     Status: Abnormal   Collection Time   06/29/12  4:48 AM      Component Value Range Comment   WBC 7.5  4.0 - 10.5 K/uL    RBC 3.97  3.87 - 5.11 MIL/uL    Hemoglobin 10.2 (*) 12.0 - 15.0 g/dL    HCT 78.2 (*) 95.6 - 46.0 %  MCV 81.6  78.0 -  100.0 fL    MCH 25.7 (*) 26.0 - 34.0 pg    MCHC 31.5  30.0 - 36.0 g/dL    RDW 16.1 (*) 09.6 - 15.5 %    Platelets 254  150 - 400 K/uL   BASIC METABOLIC PANEL     Status: Abnormal   Collection Time   06/29/12  4:48 AM      Component Value Range Comment   Sodium 135  135 - 145 mEq/L    Potassium 3.9  3.5 - 5.1 mEq/L    Chloride 96  96 - 112 mEq/L    CO2 33 (*) 19 - 32 mEq/L    Glucose, Bld 129 (*) 70 - 99 mg/dL    BUN 30 (*) 6 - 23 mg/dL    Creatinine, Ser 0.45 (*) 0.50 - 1.10 mg/dL    Calcium 40.9 (*) 8.4 - 10.5 mg/dL    GFR calc non Af Amer 35 (*) >90 mL/min    GFR calc Af Amer 41 (*) >90 mL/min   GLUCOSE, CAPILLARY     Status: Abnormal   Collection Time   06/29/12  7:30 AM      Component Value Range Comment   Glucose-Capillary 145 (*) 70 - 99 mg/dL    Comment 1 Notify RN      Comment 2 Documented in Chart     GLUCOSE, CAPILLARY     Status: Abnormal   Collection Time   06/29/12 11:32 AM      Component Value Range Comment   Glucose-Capillary 156 (*) 70 - 99 mg/dL   GLUCOSE, CAPILLARY     Status: Abnormal   Collection Time   06/29/12  5:26 PM      Component Value Range Comment   Glucose-Capillary 123 (*) 70 - 99 mg/dL    No results found.  ROS: General:Had been doing well until fall and sprained ankle Skin:no ulcers HEENT:no blurred vision CV:no chest pain PUL:+ SOB prior to admit GI:no diarrhea, constipation, or melena GU:no hematuria no dysuria MS:now with rt foot pain Neuro:no syncope Endo:new diabetes   Blood pressure 99/61, pulse 76, temperature 98.7 F (37.1 C), temperature source Oral, resp. rate 16, height 5\' 6"  (1.676 m), weight 82.6 kg (182 lb 1.6 oz), SpO2 94.00%. PE: General:alert and oriented, pleasant affect Skin:warm and dry brisk capillary refill, lower extremeties erythematous  HEENT:normocephlaic, sclera clear Neck:supple, no JVD, no bruits Heart:S1S2 RRR with occ premaure beat. No obvious murmur, no rub Lungs: crackles in the bases, no  wheezes Abd:+ BS soft, nontender Ext:+ 3+ edema, erythematous Neuro:alert and oriented    Assessment/Plan Principal Problem:  *Acute combined systolic and diastolic CHF, NYHA class 2 Active Problems:  Hypoxemia  Pedal edema  Cellulitis  CHF (congestive heart failure)  LBBB (left bundle branch block)  DVT (deep venous thrombosis)  DM (diabetes mellitus)  PVD (peripheral vascular disease)  CKD (chronic kidney disease) stage 3, GFR 30-59 ml/min  Cardiomyopathy, EF 25%  PLAN:with EF 25% high risk for arrhthymias and sudden death will need to be on telemetry.  Most likely will need lifevest at discharge if pt agrees.  Plan for lexiscan myoview to evalaute for ischemia.  See Dr. Landry Dyke note for further recommendations.  INGOLD,LAURA R 06/29/2012, 5:34 PM   Patient seen and examined. Agree with assessment and plan. 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 She has had a progressive 2 week history of leg swelling, increased SOB, and  has bilateral DVT's with probable cellulitis.  Echo reveals EF 25% with pseudonormal Grade 2 diastolic dysfunction. BNP 10162. She denies any chest pain. ECG with LBBB and PAC's. Recommend transfer to telemetry in light of potential arryhthmic risk for monitoring. Will change lopressor to carvedilol 3.125 mg bid initially with titration to 6.25 mg and upwards as BP and P allow. Will add low dose aldosterone blockade with spironolactone 12.5 mg bid and f/u renal fxn and K. Continue diuresis with lasix. Will assess for potential ischemic etiology with lexiscan myoview study.  Consider low dose ACE-I or ARB if renal function can tolerate. Will follow.   Lennette Bihari, MD, [facility] 06/29/2012 5:43 PM

## 2012-06-29 NOTE — Consult Note (Signed)
Vascular Surgery Consultation  Reason for Consult: Cool numb right foot  HPI: [name] [name] is a 66 y.o. female who presents for evaluation of cool right foot. This patient was admitted 10 days ago for swelling in the lower extremities and discomfort right worse than left as well as numbness in both feet right worse than left. She states her symptoms have been present for about 3-4 weeks. She denies any chest pain or dyspnea on exertion at the time of admission. She was found to have bilateral DVT after admission has been treated with anticoagulants. She has not had a pulmonary embolus. Cardiology workup revealed severe systolic and diastolic heart failure with ejection fraction of 25%. She has been having intermittent discomfort in the right leg and foot over the past 2-3 weeks. She denies any history of nonhealing ulcer, infection, gangrene, or previous claudication.   Past Medical History  Diagnosis Date  . Cardiomyopathy, EF 25% 06/29/2012   Past Surgical History  Procedure Date  . Abdominal hysterectomy   . Cesarean section   . Back surgery    History   Social History  . Marital Status: Widowed    Spouse Name: N/A    Number of Children: N/A  . Years of Education: N/A   Social History Main Topics  . Smoking status: Never Smoker   . Smokeless tobacco: Never Used  . Alcohol Use: No  . Drug Use: No  . Sexually Active:    Other Topics Concern  . None   Social History Narrative  . None   Family History  Problem Relation Age of Onset  . Cancer Mother   . CAD Mother   . Cancer Father   . CAD Father    Allergies  Allergen Reactions  . Penicillins Anaphylaxis  . Sulfa Antibiotics Anaphylaxis   Prior to Admission medications   Medication Sig Start Date End Date Taking? Authorizing Provider  ibuprofen (ADVIL,MOTRIN) 200 MG tablet Take 200 mg by mouth every 6 (six) hours as needed. Pain   Yes Historical Provider, MD  Multiple Vitamin (MULTIVITAMIN) LIQD Take 5 mLs  by mouth daily.   Yes Historical Provider, MD  triamcinolone ointment (KENALOG) 0.5 % Apply 1 application topically 2 (two) times daily. 02/15/12 02/14/13 Yes Adlih Moreno-Coll, MD     Positive ROS: Complains of numbness in both lower extremities, claudication right leg, negative for chest pain, dyspnea on exertion, PND, orthopnea, hemoptysis.  All other systems have been reviewed and were otherwise negative with the exception of those mentioned in the HPI and as above.  Physical Exam: Filed Vitals:   06/29/12 1510  BP: 99/61  Pulse: 76  Temp: 98.7 F (37.1 C)  Resp: 16    General: Alert, no acute distress HEENT: Normal for age Cardiovascular: Regular rate and rhythm. Carotid pulses 2+, no bruits audible Respiratory: Clear to auscultation. No cyanosis, no use of accessory musculature GI: No organomegaly, abdomen is soft and non-tender Skin: No lesions in the area of chief complaint Neurologic: Sensation intact distally Psychiatric: Patient is competent for consent with normal mood and affect Musculoskeletal: No obvious deformities Extremities: Left leg with 3+ femoral 2+ popliteal pulse palpable. Biphasic flow and dorsalis pedis and posterior tibial pulse left foot. 2+ edema diffusely from thigh to foot. Right leg with 2-3+ femoral pulse palpable. No popliteal or distal pulses palpable. Doppler flow in the peroneal artery right leg. She does have motion and sensation similarly in both feet with some numbness. No calf tenderness bilaterally. No  evidence of gangrene or infection. Does have good dorsiflexion of both feet. Right foot does have fair capillary refill.  Imaging reviewed: Duplex scan of right lower extremity tonight revealed occlusion of right popliteal artery with tibial occlusive disease. ABI not checked because of DVT.   Assessment/Plan:  #1 severe systolic and diastolic congestive heart failure with EF 25% #2 lateral DVT on anticoagulants #3 severe lower extremity  occlusive disease and right leg with probable distal SFA and popliteal occlusion and tibial occlusive disease. Right leg not in immediate jeopardy at the present time. Patient is not a candidate for any evaluation including surgical procedure at the present time because of severe heart failure and bilateral DVT which is acute.  Discussed the situation with patient and her daughter and they do understand this and agree. If patient's medical condition improves over the next few months with improvement in her congestive failure and DVT resolves-the patient may become a candidate for angiography and possible intervention or surgery-but not at present time   Josephina Gip, MD 06/29/2012 6:47 PM

## 2012-06-29 NOTE — Progress Notes (Signed)
INITIAL ADULT NUTRITION ASSESSMENT Date: 06/29/2012   Time: 5:23 PM Reason for Assessment: Nutrition risk  ASSESSMENT: Female 66 y.o.  Dx: Acute combined systolic and diastolic CHF, NYHA class 2  Hx:  Past Medical History  Diagnosis Date  . Cardiomyopathy, EF 25% 06/29/2012   Past Surgical History  Procedure Date  . Abdominal hysterectomy   . Cesarean section   . Back surgery      Related Meds:     . aspirin EC  325 mg Oral Daily  . atorvastatin  10 mg Oral q1800  . docusate sodium  100 mg Oral BID  . furosemide  40 mg Intravenous BID  . insulin aspart  0-9 Units Subcutaneous TID WC  . levofloxacin  500 mg Oral Daily  . metoprolol tartrate  25 mg Oral BID  . polyethylene glycol  17 g Oral Daily  . sodium chloride  3 mL Intravenous Q12H  . sodium chloride  3 mL Intravenous Q12H  . [COMPLETED] warfarin  4 mg Oral ONCE-1800  . warfarin  4 mg Oral ONCE-1800  . Warfarin - Pharmacist Dosing Inpatient   Does not apply q1800     Ht: 5\' 6"  (167.6 cm)  Wt: 182 lb 1.6 oz (82.6 kg)  Ideal Wt: 59.3 kg  % Ideal Wt: 139  Usual Wt: 145# % Usual Wt: 126  Body mass index is 29.39 kg/(m^2).   Labs:  CMP     Component Value Date/Time   NA 135 06/29/2012 0448   K 3.9 06/29/2012 0448   CL 96 06/29/2012 0448   CO2 33* 06/29/2012 0448   GLUCOSE 129* 06/29/2012 0448   BUN 30* 06/29/2012 0448   CREATININE 1.51* 06/29/2012 0448   CALCIUM 11.8* 06/29/2012 0448   PROT 7.8 04/04/2008 1020   ALBUMIN 4.0 04/04/2008 1020   AST 17 04/04/2008 1020   ALT 24 04/04/2008 1020   ALKPHOS 123* 04/04/2008 1020   BILITOT 0.7 04/04/2008 1020   GFRNONAA 35* 06/29/2012 0448   GFRAA 41* 06/29/2012 0448    I/O last 3 completed shifts: In: 960 [P.O.:960] Out: 5395 [Urine:5395] Total I/O In: 480 [P.O.:480] Out: 1775 [Urine:1775]   Diet Order: General  Supplements/Tube Feeding:  none  IVF:    Estimated Nutritional Needs:   Kcal: 1600-1800 Protein: 75-85 gm Fluid: 1.6-1.8L  Food/Nutrition  Related Hx: Fair intake prior to admit and currently.  States she followed a low sodium diet.  Request ensure.  NUTRITION DIAGNOSIS: -Inadequate oral intake (NI-2.1).  Status: Ongoing  RELATED TO: decreased appetite  AS EVIDENCE BY: pt report  MONITORING/EVALUATION(Goals): Intake of >75% meals and supplements  EDUCATION NEEDS: -No education needs identified at this time  INTERVENTION: Ensure Complete bid  Dietitian [phone]  DOCUMENTATION CODES Per approved criteria  -Not Applicable    Jeoffrey Massed 06/29/2012, 5:23 PM

## 2012-06-29 NOTE — Progress Notes (Signed)
*  PRELIMINARY RESULTS* Vascular Ultrasound Right lower extremity arterial duplex has been completed.  Preliminary findings: Evidence of occlusion involving the right popliteal artery. Very limited recanalized flow is visualized in the mid to distal right posterior tibial and anterior tibial arteries.  Results called to Dr. Hart Rochester and Dr. Ardyth Harps.   Farrel Demark, RDMS, RVT 06/29/2012, 4:22 PM

## 2012-06-29 NOTE — Progress Notes (Addendum)
ANTICOAGULATION CONSULT NOTE - Follow Up Consult  Pharmacy Consult for Warfarin Indication: new DVT  Allergies  Allergen Reactions  . Penicillins Anaphylaxis  . Sulfa Antibiotics Anaphylaxis   Patient Measurements: Height: 5\' 6"  (167.6 cm) Weight: 182 lb 1.6 oz (82.6 kg) IBW/kg (Calculated) : 59.3   Labs:  Basename 06/29/12 0448 06/28/12 0455 06/27/12 0440 06/26/12 2214 06/26/12 1524  HGB 10.2* 10.0* -- -- --  HCT 32.4* 32.4* 31.0* -- --  PLT 254 240 219 -- --  APTT -- -- -- -- --  LABPROT 23.0* 22.7* 25.6* -- --  INR 2.14* 2.10* 2.47* -- --  HEPARINUNFRC -- -- 0.44 0.53 0.61  CREATININE 1.51* 1.42* 1.43* -- --  CKTOTAL -- -- -- -- --  CKMB -- -- -- -- --  TROPONINI -- -- -- -- --    Estimated Creatinine Clearance: 40.2 ml/min (by C-G formula based on Cr of 1.51).  Assessment:  [name] [name] presented 10/29 with BLE swelling x 2 weeks, mild DOE. IV heparin started for r/o DVT.  On 10/30, preliminary result with + DVT in posterior tibial vein, warfarin added.  Patient completed minimal heparin/warfarin bridge 11/4.    INR therapeutic today.  Average coumadin doses for the past 7 days = 5mg .  Doxycycline treatment ended 11/4.  Noted potential drug interaction with warfarin (may increase INR). Levaquin started 11/5 x 3 doses for E.coli UTI, potential drug interaction noted (may increase INR)  Pharmacy completed warfarin education.  Plan discharge home in 1-2 days per MD notes  Goal of Therapy:  INR 2-3   Plan:   Coumadin 4mg  po x 1 tonight.  If patient is discharged, would recommend Coumadin 2.5mg  po daily given interaction with Levaquin.  Daily PT/INR  Pharmacy will f/u  Geoffry Paradise, PharmD, BCPS Pager: [phone] 8:47 AM Pharmacy #: [phone]

## 2012-06-30 DIAGNOSIS — I743 Embolism and thrombosis of arteries of the lower extremities: Secondary | ICD-10-CM | POA: Diagnosis present

## 2012-06-30 LAB — PRO B NATRIURETIC PEPTIDE: Pro B Natriuretic peptide (BNP): 12411 pg/mL — ABNORMAL HIGH (ref 0–125)

## 2012-06-30 LAB — BASIC METABOLIC PANEL
CO2: 34 mEq/L — ABNORMAL HIGH (ref 19–32)
Chloride: 93 mEq/L — ABNORMAL LOW (ref 96–112)
Glucose, Bld: 175 mg/dL — ABNORMAL HIGH (ref 70–99)
Sodium: 136 mEq/L (ref 135–145)

## 2012-06-30 LAB — MAGNESIUM: Magnesium: 1.6 mg/dL (ref 1.5–2.5)

## 2012-06-30 LAB — GLUCOSE, CAPILLARY
Glucose-Capillary: 178 mg/dL — ABNORMAL HIGH (ref 70–99)
Glucose-Capillary: 191 mg/dL — ABNORMAL HIGH (ref 70–99)
Glucose-Capillary: 171 mg/dL — ABNORMAL HIGH (ref 70–99)

## 2012-06-30 MED ORDER — LORAZEPAM 1 MG PO TABS
1.0000 mg | ORAL_TABLET | Freq: Once | ORAL | Status: AC
  Administered 2012-06-30: 1 mg via ORAL
  Filled 2012-06-30: qty 1

## 2012-06-30 MED ORDER — INSULIN GLARGINE 100 UNIT/ML ~~LOC~~ SOLN
5.0000 [IU] | Freq: Every day | SUBCUTANEOUS | Status: DC
  Administered 2012-06-30: 5 [IU] via SUBCUTANEOUS

## 2012-06-30 MED ORDER — WARFARIN SODIUM 6 MG PO TABS
6.0000 mg | ORAL_TABLET | Freq: Once | ORAL | Status: AC
  Administered 2012-06-30: 6 mg via ORAL
  Filled 2012-06-30: qty 1

## 2012-06-30 NOTE — Progress Notes (Signed)
Pt c/o SOB. Pt's O2 sats 98% on RA, lung sounds Clear. Pt subjectively appears anxious and after talking to patient she expresses fear of diagnosis of heart failure and becomes tearful. Pt expresses fear of having heart attack in future. EKG performed, on-call MD and cardiologist notified, new orders received. Will continue to monitor.  Earnest Conroy. Clelia Croft, RN

## 2012-06-30 NOTE — Progress Notes (Signed)
[facility]  Subjective: Feeling better  Objective: Vital signs in last 24 hours: Temp:  [98.1 F (36.7 C)-98.6 F (37 C)] 98.1 F (36.7 C) (11/07 0502) Pulse Rate:  [76-80] 76  (11/07 0502) Resp:  [18-19] 18  (11/07 0502) BP: (107-115)/(56-67) 107/56 mmHg (11/07 0502) SpO2:  [93 %-95 %] 93 % (11/07 0502) Weight:  [79 kg (174 lb 2.6 oz)] 79 kg (174 lb 2.6 oz) (11/07 0502) Last BM Date: 06/21/12  Intake/Output from previous day: 11/06 0701 - 11/07 0700 In: 480 [P.O.:480] Out: 3500 [Urine:3500] Intake/Output this shift: Total I/O In: 420 [P.O.:420] Out: 800 [Urine:800]  Medications Current Facility-Administered Medications  Medication Dose Route Frequency Provider Last Rate Last Dose  . aspirin EC tablet 81 mg  81 mg Oral Daily Nada Boozer, NP   81 mg at 06/30/12 1058  . atorvastatin (LIPITOR) tablet 10 mg  10 mg Oral q1800 Henderson Cloud, MD   10 mg at 06/29/12 1728  . bisacodyl (DULCOLAX) suppository 10 mg  10 mg Rectal Daily PRN Henderson Cloud, MD      . carvedilol (COREG) tablet 3.125 mg  3.125 mg Oral BID WC Nada Boozer, NP   3.125 mg at 06/30/12 0840  . docusate sodium (COLACE) capsule 100 mg  100 mg Oral BID Houston Siren, MD   100 mg at 06/30/12 1058  . feeding supplement (ENSURE COMPLETE) liquid 237 mL  237 mL Oral BID BM Jeoffrey Massed, RD   237 mL at 06/30/12 1400  . furosemide (LASIX) injection 40 mg  40 mg Intravenous BID Dorothea Ogle, MD   40 mg at 06/30/12 0840  . insulin aspart (novoLOG) injection 0-9 Units  0-9 Units Subcutaneous TID WC Dorothea Ogle, MD   2 Units at 06/30/12 1237  . insulin glargine (LANTUS) injection 5 Units  5 Units Subcutaneous QHS Estela Isaiah Blakes, MD      . [COMPLETED] levofloxacin [facility]) tablet 500 mg  500 mg Oral Daily Dorothea Ogle, MD   500 mg at 06/30/12 1058  . [COMPLETED] LORazepam (ATIVAN) tablet 1 mg  1 mg Oral Once Leanne Chang, NP   1 mg at 06/30/12 0432  .  morphine 2 MG/ML injection 2 mg  2 mg Intravenous Q2H PRN Houston Siren, MD   2 mg at 06/26/12 0008  . ondansetron (ZOFRAN) tablet 4 mg  4 mg Oral Q6H PRN Houston Siren, MD   4 mg at 06/29/12 0128   Or  . ondansetron (ZOFRAN) injection 4 mg  4 mg Intravenous Q6H PRN Houston Siren, MD   4 mg at 06/29/12 1734  . oxyCODONE-acetaminophen (PERCOCET/ROXICET) 5-325 MG per tablet 1-2 tablet  1-2 tablet Oral Q4H PRN Dorothea Ogle, MD   2 tablet at 06/29/12 0123  . polyethylene glycol (MIRALAX / GLYCOLAX) packet 17 g  17 g Oral Daily Henderson Cloud, MD   17 g at 06/30/12 1058  . sodium chloride 0.9 % injection 3 mL  3 mL Intravenous Q12H Houston Siren, MD   3 mL at 06/30/12 1000  . spironolactone (ALDACTONE) tablet 12.5 mg  12.5 mg Oral BID Nada Boozer, NP   12.5 mg at 06/30/12 1058  . [COMPLETED] warfarin (COUMADIN) tablet 4 mg  4 mg Oral ONCE-1800 Thuyvan Thi Phan, PHARMD   4 mg at 06/29/12 1728  . warfarin (COUMADIN) tablet 6 mg  6 mg Oral ONCE-1800 Christine E Shade, PHARMD      .  Warfarin - Pharmacist Dosing Inpatient   Does not apply q1800 Theda Sers, PHARMD      . [DISCONTINUED] 0.9 %  sodium chloride infusion  250 mL Intravenous PRN Houston Siren, MD 9 mL/hr at 06/24/12 0700 250 mL at 06/24/12 0700  . [DISCONTINUED] aspirin EC tablet 325 mg  325 mg Oral Daily Houston Siren, MD   325 mg at 06/29/12 0902  . [DISCONTINUED] metoprolol tartrate (LOPRESSOR) tablet 25 mg  25 mg Oral BID Henderson Cloud, MD   25 mg at 06/29/12 1728  . [DISCONTINUED] sodium chloride 0.9 % injection 3 mL  3 mL Intravenous Q12H Houston Siren, MD   3 mL at 06/29/12 0826  . [DISCONTINUED] sodium chloride 0.9 % injection 3 mL  3 mL Intravenous PRN Houston Siren, MD        PE: General appearance: alert, cooperative and no distress Lungs: clear to auscultation bilaterally Heart: regular rate and rhythm, S1, S2 normal, no murmur, click, rub or gallop Extremities: 2+ LEE Pulses: 2+ and symmetric right PT 2+, left PT 1+ Skin: errythema  noted in the right foot. Neurologic: Grossly normal  Lab Results:   Basename 06/29/12 0448 06/28/12 0455  WBC 7.5 7.4  HGB 10.2* 10.0*  HCT 32.4* 32.4*  PLT 254 240   BMET  Basename 06/30/12 0500 06/29/12 0448 06/28/12 0455  NA 136 135 135  K 3.8 3.9 4.0  CL 93* 96 97  CO2 34* 33* 30  GLUCOSE 175* 129* 162*  BUN 27* 30* 28*  CREATININE 1.38* 1.51* 1.42*  CALCIUM 12.1* 11.8* 11.7*   PT/INR  Basename 06/30/12 0500 06/29/12 0448 06/28/12 0455  LABPROT 22.2* 23.0* 22.7*  INR 2.04* 2.14* 2.10*   Cholesterol No results found for this basename: CHOL in the last 72 hours Cardiac Enzymes No components found with this basename: TROPONIN:3, CKMB:3  Studies/Results: @RISRSLT2 @   Assessment/Plan   Principal Problem:  *Acute combined systolic and diastolic CHF, NYHA class 2 Active Problems:  Hypoxemia  Pedal edema  Cellulitis  CHF (congestive heart failure)  LBBB (left bundle branch block)  DVT (deep venous thrombosis)  DM (diabetes mellitus)  PVD (peripheral vascular disease)  CKD (chronic kidney disease) stage 3, GFR 30-59 ml/min  Cardiomyopathy, EF 25%  Popliteal artery thrombosis, right Hypercalcemia.  Plan:  Net fluids -5.4L in the last 48 hours with administration of 40mg  IV lasix BID.  BNP continues to go up despite this.   Continue current therapy.  Will check PTH due to high Ca2+.  Now 12.1.  Ionized Ca elevated at admission.  For some reason this heart failure, diabetic patient is eating a plateful of carbs with added salt.  I changed her diet!   Lexiscan myoview in the AM.   SCr improved.   LOS: 9 days    HAGER, BRYAN 06/30/2012 5:00 PM  I have seen and examined the patient along with Wilburt Finlay, PA.  I have reviewed the chart, notes and new data.  I agree with PA's note.  Very high likelihood of ischemic cardiomyopathy. Some mild to moderate calcification is noted in the proximal and distal left main coronary artery and in the proximal LAD and  circumflex arteries by CT of the chest.  PLAN: While we are striving to achieve CHF compensation, a nuclear perfusion study will be informative, but she will eventually require coronary angiography - depressed LV function, presence of coronary calcifications, presence of PAD and diabetes mellitus make multivessel CAD very likely.  Rogen Porte,  MD, [facility] [phone] 06/30/2012, 6:15 PM

## 2012-06-30 NOTE — Progress Notes (Signed)
[facility]             Progress Note   Subjective: Appears overwhelmed. Had episode of anxiety last pm. Improved with ativan given.  Objective: Vital signs in last 24 hours: Temp:  [98.1 F (36.7 C)-98.7 F (37.1 C)] 98.1 F (36.7 C) (11/07 0502) Pulse Rate:  [76-80] 76  (11/07 0502) Resp:  [16-19] 18  (11/07 0502) BP: (99-115)/(56-67) 107/56 mmHg (11/07 0502) SpO2:  [93 %-95 %] 93 % (11/07 0502) Weight:  [79 kg (174 lb 2.6 oz)] 79 kg (174 lb 2.6 oz) (11/07 0502) Weight change:  Last BM Date: 06/21/12  Intake/Output from previous day: 11/06 0701 - 11/07 0700 In: 480 [P.O.:480] Out: 3500 [Urine:3500] Total I/O In: 420 [P.O.:420] Out: 800 [Urine:800]   Physical Exam: General: Alert, awake, oriented x3. HEENT: No bruits, no goiter. Heart: Regular rate and rhythm, without murmurs, rubs, gallops. Lungs: Mild bibasilar crackles. Abdomen: Soft, nontender, nondistended, positive bowel sounds. Extremities: 2+ edema to midshins bilaterally. No palpable pulse (or by doppler) to right foot that is cold to touch. Neuro: Grossly intact, nonfocal.    Lab Results: Basic Metabolic Panel:  Basename 06/30/12 0500 06/29/12 0448  NA 136 135  K 3.8 3.9  CL 93* 96  CO2 34* 33*  GLUCOSE 175* 129*  BUN 27* 30*  CREATININE 1.38* 1.51*  CALCIUM 12.1* 11.8*  MG 1.6 --  PHOS -- --   CBC:  Basename 06/29/12 0448 06/28/12 0455  WBC 7.5 7.4  NEUTROABS -- --  HGB 10.2* 10.0*  HCT 32.4* 32.4*  MCV 81.6 82.4  PLT 254 240   BNP:  Basename 06/30/12 0501 06/28/12 0455  PROBNP 12411.0* 10162.0*   CBG:  Basename 06/30/12 1213 06/30/12 0822 06/29/12 1726 06/29/12 1132 06/29/12 0730 06/28/12 2148  GLUCAP 191* 178* 123* 156* 145* 222*   Hemoglobin A1C:  Basename 06/27/12 1430  HGBA1C 9.0*   Coagulation:  Basename 06/30/12 0500 06/29/12 0448  LABPROT 22.2* 23.0*  INR 2.04* 2.14*    Recent Results (from the past 240 hour(s))  URINE CULTURE     Status:  Normal   Collection Time   06/25/12  6:28 PM      Component Value Range Status Comment   Specimen Description URINE, CLEAN CATCH   Final    Special Requests NONE   Final    Culture  Setup Time 06/26/2012 15:42   Final    Colony Count >=100,000 COLONIES/ML   Final    Culture ESCHERICHIA COLI   Final    Report Status 06/28/2012 FINAL   Final    Organism ID, Bacteria ESCHERICHIA COLI   Final     Studies/Results: No results found.  Medications: Scheduled Meds:    . aspirin EC  81 mg Oral Daily  . atorvastatin  10 mg Oral q1800  . carvedilol  3.125 mg Oral BID WC  . docusate sodium  100 mg Oral BID  . feeding supplement  237 mL Oral BID BM  . furosemide  40 mg Intravenous BID  . insulin aspart  0-9 Units Subcutaneous TID WC  . [COMPLETED] levofloxacin  500 mg Oral Daily  . [COMPLETED] LORazepam  1 mg Oral Once  . polyethylene glycol  17 g Oral Daily  . sodium chloride  3 mL Intravenous Q12H  . spironolactone  12.5 mg Oral BID  . [COMPLETED] warfarin  4 mg Oral ONCE-1800  . warfarin  6 mg Oral ONCE-1800  . Warfarin - Pharmacist Dosing Inpatient  Does not apply q1800  . [DISCONTINUED] aspirin EC  325 mg Oral Daily  . [DISCONTINUED] metoprolol tartrate  25 mg Oral BID  . [DISCONTINUED] sodium chloride  3 mL Intravenous Q12H   Continuous Infusions:  PRN Meds:.bisacodyl, morphine injection, ondansetron (ZOFRAN) IV, ondansetron, oxyCODONE-acetaminophen, [DISCONTINUED] sodium chloride, [DISCONTINUED] sodium chloride  Assessment/Plan:  Principal Problem:  *Acute combined systolic and diastolic CHF, NYHA class 2 Active Problems:  Hypoxemia  Pedal edema  Cellulitis  CHF (congestive heart failure)  LBBB (left bundle branch block)  DVT (deep venous thrombosis)  DM (diabetes mellitus)  PVD (peripheral vascular disease)  CKD (chronic kidney disease) stage 3, GFR 30-59 ml/min  Cardiomyopathy, EF 25%  Popliteal artery thrombosis, right   Acute Combined CHF -New diagnosis. -EF  25% per ECHO -Has been seen by cardiology. -Appears plan is for myoview: please define timing of this! -Continue lasix at current dose. -She is about 10 L negative since admission. -On ASA/BB/statin. -No ACE-I given renal failure.  Acute Bilateral LE DVT -On coumadin. -INR therapeutic. -Pharmacy dosing.  Popliteal Arterial Occlusion -Diagnosed via arterial duplex. -Has been seen by vascular, Dr. Hart Rochester. -Given acute ongoing medical issues, she is not a candidate for intervention at this time.  Acute on CKD Stage III -Her baseline creatinine appears to be in the 1.4-1.5 range. -Likely related to long-time uncontrolled DM. -Cr slightly improved today to 1.38 from 1.51 11/6.  DM -Patient had been refusing insulin. -She is now agreeable. -CBGs remain elevated. -Start lantus 5.  E Coli UTI -Continue levaquin for 2 more days.   Time spent coordinating care: 35 minutes   LOS: 9 days   [name],[name] [name] [facility] Pager: [phone] 06/30/2012, 1:42 PM

## 2012-06-30 NOTE — Progress Notes (Signed)
Pt stated that she has not had a BM since 10/29. Pt has active bowel sounds, is positive for flatus. Colace and Mirliax given. Pt denies dulcolax suppository at this time. [name] [name] A

## 2012-06-30 NOTE — Progress Notes (Signed)
ANTICOAGULATION CONSULT NOTE - Follow Up Consult  Pharmacy Consult for Warfarin Indication: new DVT  Allergies  Allergen Reactions  . Penicillins Anaphylaxis  . Sulfa Antibiotics Anaphylaxis   Patient Measurements: Height: 5\' 6"  (167.6 cm) Weight: 174 lb 2.6 oz (79 kg) (standup scale) IBW/kg (Calculated) : 59.3   Labs:  Basename 06/30/12 0500 06/29/12 0448 06/28/12 0455  HGB -- 10.2* 10.0*  HCT -- 32.4* 32.4*  PLT -- 254 240  APTT -- -- --  LABPROT 22.2* 23.0* 22.7*  INR 2.04* 2.14* 2.10*  HEPARINUNFRC -- -- --  CREATININE 1.38* 1.51* 1.42*  CKTOTAL -- -- --  CKMB -- -- --  TROPONINI -- -- --    Estimated Creatinine Clearance: 43.1 ml/min (by C-G formula based on Cr of 1.38).  Assessment:  63 yof presented 10/29 with BLE swelling x 2 weeks, mild DOE.  10/30, preliminary result with + DVT in posterior tibial vein, heparin and warfarin started.  Patient completed minimal heparin/warfarin bridge 11/4.    INR therapeutic today.  Average coumadin doses for the past 7 days = 5mg .   Noted potential drug interaction with warfarin (may increase INR):  Doxycycline completed 11/4.  Levaquin completed 11/6  Pharmacy completed warfarin education.  Goal of Therapy:  INR 2-3   Plan:   Coumadin 6mg  PO today at 1800.  Daily PT/INR  Pharmacy will f/u   Lynann Beaver PharmD, BCPS Pager [phone] 06/30/2012 11:28 AM

## 2012-07-01 LAB — BASIC METABOLIC PANEL
Calcium: 11.9 mg/dL — ABNORMAL HIGH (ref 8.4–10.5)
GFR calc Af Amer: 47 mL/min — ABNORMAL LOW (ref >90)
GFR calc non Af Amer: 41 mL/min — ABNORMAL LOW (ref >90)
Glucose, Bld: 218 mg/dL — ABNORMAL HIGH (ref 70–99)
Potassium: 3.9 mEq/L (ref 3.5–5.1)
Sodium: 138 mEq/L (ref 135–145)

## 2012-07-01 LAB — GLUCOSE, CAPILLARY
Glucose-Capillary: 192 mg/dL — ABNORMAL HIGH (ref 70–99)
Glucose-Capillary: 136 mg/dL — ABNORMAL HIGH (ref 70–99)

## 2012-07-01 LAB — PROTIME-INR
INR: 2.25 — ABNORMAL HIGH (ref 0.00–1.49)
Prothrombin Time: 23.9 seconds — ABNORMAL HIGH (ref 11.6–15.2)

## 2012-07-01 MED ORDER — INSULIN GLARGINE 100 UNIT/ML ~~LOC~~ SOLN
10.0000 [IU] | Freq: Every day | SUBCUTANEOUS | Status: DC
  Administered 2012-07-01 – 2012-07-02 (×2): 10 [IU] via SUBCUTANEOUS

## 2012-07-01 MED ORDER — LORAZEPAM 0.5 MG PO TABS
0.5000 mg | ORAL_TABLET | Freq: Once | ORAL | Status: AC
  Administered 2012-07-01: 0.5 mg via ORAL
  Filled 2012-07-01: qty 1

## 2012-07-01 MED ORDER — LIVING WELL WITH DIABETES BOOK
Freq: Once | Status: DC
  Filled 2012-07-01: qty 1

## 2012-07-01 MED ORDER — WARFARIN SODIUM 5 MG PO TABS
5.0000 mg | ORAL_TABLET | Freq: Once | ORAL | Status: AC
  Administered 2012-07-01: 5 mg via ORAL
  Filled 2012-07-01: qty 1

## 2012-07-01 NOTE — Progress Notes (Signed)
Agree with above assessment and plan.  Lynann Beaver PharmD, BCPS Pager [phone] 07/01/2012 10:25 AM

## 2012-07-01 NOTE — Progress Notes (Signed)
Subjective: Pt refused nuclear stress test this AM.  She wanted more information.  Objective: Vital signs in last 24 hours: Temp:  [97.7 F (36.5 C)-98.2 F (36.8 C)] 97.7 F (36.5 C) (11/08 0533) Pulse Rate:  [79-83] 83  (11/08 0533) Resp:  [18] 18  (11/08 0533) BP: (107-119)/(43-60) 118/57 mmHg (11/08 0533) SpO2:  [90 %-93 %] 90 % (11/08 0533) Weight:  [79.1 kg (174 lb 6.1 oz)] 79.1 kg (174 lb 6.1 oz) (11/08 0533) Weight change: 0.1 kg (3.5 oz) Last BM Date: 07/01/12 Intake/Output from previous day: -678  Wt 79 kg down from pk of 85kg. 11/07 0701 - 11/08 0700 In: 423 [P.O.:420; I.V.:3] Out: 1101 [Urine:1100; Stool:1] Intake/Output this shift: Total I/O In: 360 [P.O.:360] Out: -   PE: General:alert and oriented, feeling better, no SOB, some diarrhea today after days of no BM Heart:S1S2 RRR on tele she has had PVCs Lungs:+ rales in the bases Abd:+ BS, soft, non tender ZOX:WRUEA with edema, but decreasing    Lab Results:  [facility] 06/29/12 0448  WBC 7.5  HGB 10.2*  HCT 32.4*  PLT 254   BMET  Basename 07/01/12 0435 06/30/12 0500  NA 138 136  K 3.9 3.8  CL 96 93*  CO2 34* 34*  GLUCOSE 218* 175*  BUN 26* 27*  CREATININE 1.34* 1.38*  CALCIUM 11.9* 12.1*    Lab Results  Component Value Date   HGBA1C 9.0* 06/27/2012     Lab Results  Component Value Date   TSH 2.776 06/21/2012      Studies/Results: No results found.  Medications: I have reviewed the patient's current medications.    Marland Kitchen aspirin EC  81 mg Oral Daily  . atorvastatin  10 mg Oral q1800  . carvedilol  3.125 mg Oral BID WC  . docusate sodium  100 mg Oral BID  . feeding supplement  237 mL Oral BID BM  . furosemide  40 mg Intravenous BID  . insulin aspart  0-9 Units Subcutaneous TID WC  . insulin glargine  5 Units Subcutaneous QHS  . polyethylene glycol  17 g Oral Daily  . sodium chloride  3 mL Intravenous Q12H  . spironolactone  12.5 mg Oral BID  . warfarin  5 mg Oral ONCE-1800  .  [COMPLETED] warfarin  6 mg Oral ONCE-1800  . Warfarin - Pharmacist Dosing Inpatient   Does not apply q1800   Assessment/Plan: Principal Problem:  *Acute combined systolic and diastolic CHF, NYHA class 2 Active Problems:  Hypoxemia  Pedal edema  Cellulitis  CHF (congestive heart failure)  LBBB (left bundle branch block)  DVT (deep venous thrombosis)  DM (diabetes mellitus)  PVD (peripheral vascular disease)  CKD (chronic kidney disease) stage 3, GFR 30-59 ml/min  Cardiomyopathy, EF 25%  Popliteal artery thrombosis, right  PLAN: Order was placed in EPIC at 1528 yesterday. Pt refused nuc study.  We discussed the reason and what to expect with study.  She has been overwhelmed with many medical issues this admit.  She is now agreeable to proceed with nuc study for tomorrow.  I have re-ordered and discussed with x-ray.  With severe PVD plan to monitor  Dr. Hart Rochester has seen. Total neg 10,303 INR 2.25 today    LOS: 10 days   INGOLD,LAURA R 07/01/2012, 12:30 PM  I seen and evaluated the patient this morning along with the PA/NP. I agree with their findings, examination as well as impression recommendations.  66 y/o woman with newly Dx'd severe CM (by WMA &  PAD history with RFs, LBBB - most likely etiology is ischemic). I agree that she needs an ischemic eval -- not sure how helpful myoview will be, but is not unreasonable in this highly anxious / pensive patient.  Severe Aon C combined HF -- She is diuresing well, with stable renal fxn on IV lasix & spironolactone. Seems to be doing OK with low dose BB -- would not titrate for now until she is closer to euvolemic & BP more stable. Calcium level remains elevated, but stable. Bilateral DVTs - likley related to slow flow state with CHF -- INR therapeutic.  Plan is Myoview tomorrow - would not be surprised if ischemia is noted; however, could be "false Neg" with balanced ischemia.   Will see her tomorrow in Nuc Med.  Marykay Lex,  M.D., M.S. [facility] [address] [place], [place]  [address]  [phone] Pager # [phone] 07/01/2012 6:09 PM

## 2012-07-01 NOTE — Progress Notes (Signed)
Patient is refusing Lexi-scan that was ordered for this AM. Patient states that she has many unanswered questions and would like to speak with her cardiologist again. The procedure was explained to her and information was given on why the test was to be performed. Nuclear med was not called to schedule procedure and care link was not called for transport.

## 2012-07-01 NOTE — Progress Notes (Signed)
[facility]             Progress Note   Subjective: Appears overwhelmed. Refused stress test because she wanted more time to "think things thru".  Objective: Vital signs in last 24 hours: Temp:  [97.7 F (36.5 C)-98.2 F (36.8 C)] 97.7 F (36.5 C) (11/08 0533) Pulse Rate:  [79-83] 83  (11/08 0533) Resp:  [18] 18  (11/08 0533) BP: (107-119)/(43-60) 118/57 mmHg (11/08 0533) SpO2:  [90 %-93 %] 90 % (11/08 0533) Weight:  [79.1 kg (174 lb 6.1 oz)] 79.1 kg (174 lb 6.1 oz) (11/08 0533) Weight change: 0.1 kg (3.5 oz) Last BM Date: 07/01/12  Intake/Output from previous day: 11/07 0701 - 11/08 0700 In: 423 [P.O.:420; I.V.:3] Out: 1101 [Urine:1100; Stool:1] Total I/O In: 360 [P.O.:360] Out: -    Physical Exam: General: Alert, awake, oriented x3. HEENT: No bruits, no goiter. Heart: Regular rate and rhythm, without murmurs, rubs, gallops. Lungs: Mild bibasilar crackles. Abdomen: Soft, nontender, nondistended, positive bowel sounds. Extremities: 2+ edema to midshins bilaterally. No palpable pulse (or by doppler) to right foot that is cold to touch. Neuro: Grossly intact, nonfocal.    Lab Results: Basic Metabolic Panel:  Basename 07/01/12 0435 06/30/12 0500  NA 138 136  K 3.9 3.8  CL 96 93*  CO2 34* 34*  GLUCOSE 218* 175*  BUN 26* 27*  CREATININE 1.34* 1.38*  CALCIUM 11.9* 12.1*  MG -- 1.6  PHOS -- --   CBC:  Basename 06/29/12 0448  WBC 7.5  NEUTROABS --  HGB 10.2*  HCT 32.4*  MCV 81.6  PLT 254   BNP:  Basename 06/30/12 0501  PROBNP 12411.0*   CBG:  Basename 07/01/12 1156 07/01/12 0753 06/30/12 2117 06/30/12 1652 06/30/12 1213 06/30/12 0822  GLUCAP 266* 192* 148* 171* 191* 178*   Hemoglobin A1C: No results found for this basename: HGBA1C in the last 72 hours Coagulation:  Basename 07/01/12 0435 06/30/12 0500  LABPROT 23.9* 22.2*  INR 2.25* 2.04*    Recent Results (from the past 240 hour(s))  URINE CULTURE     Status: Normal   Collection Time   06/25/12  6:28 PM      Component Value Range Status Comment   Specimen Description URINE, CLEAN CATCH   Final    Special Requests NONE   Final    Culture  Setup Time 06/26/2012 15:42   Final    Colony Count >=100,000 COLONIES/ML   Final    Culture ESCHERICHIA COLI   Final    Report Status 06/28/2012 FINAL   Final    Organism ID, Bacteria ESCHERICHIA COLI   Final     Studies/Results: No results found.  Medications: Scheduled Meds:    . aspirin EC  81 mg Oral Daily  . atorvastatin  10 mg Oral q1800  . carvedilol  3.125 mg Oral BID WC  . docusate sodium  100 mg Oral BID  . feeding supplement  237 mL Oral BID BM  . furosemide  40 mg Intravenous BID  . insulin aspart  0-9 Units Subcutaneous TID WC  . insulin glargine  5 Units Subcutaneous QHS  . polyethylene glycol  17 g Oral Daily  . sodium chloride  3 mL Intravenous Q12H  . spironolactone  12.5 mg Oral BID  . warfarin  5 mg Oral ONCE-1800  . [COMPLETED] warfarin  6 mg Oral ONCE-1800  . Warfarin - Pharmacist Dosing Inpatient   Does not apply q1800   Continuous Infusions:  PRN Meds:.bisacodyl,  morphine injection, ondansetron (ZOFRAN) IV, ondansetron, oxyCODONE-acetaminophen  Assessment/Plan:  Principal Problem:  *Acute combined systolic and diastolic CHF, NYHA class 2 Active Problems:  Hypoxemia  Pedal edema  Cellulitis  CHF (congestive heart failure)  LBBB (left bundle branch block)  DVT (deep venous thrombosis)  DM (diabetes mellitus)  PVD (peripheral vascular disease)  CKD (chronic kidney disease) stage 3, GFR 30-59 ml/min  Cardiomyopathy, EF 25%  Popliteal artery thrombosis, right   Acute Combined CHF -New diagnosis. -EF 25% per ECHO -Has been seen by cardiology. -She had refused myoview, but after talking with her she has agreed to reschedule for tomorrow am. -Continue lasix at current dose. -She is about 10 L negative since admission. -On ASA/BB/statin. -No ACE-I given renal  failure.  Acute Bilateral LE DVT -On coumadin. -INR therapeutic. -Pharmacy dosing.  Popliteal Arterial Occlusion -Diagnosed via arterial duplex. -Has been seen by vascular, Dr. Hart Rochester. -Given acute ongoing medical issues, she is not a candidate for intervention at this time.  Acute on CKD Stage III -Her baseline creatinine appears to be in the 1.4-1.5 range. -Likely related to long-time uncontrolled DM. -Cr slightly improved today to 1.34 from 1.51 11/6.  DM -Patient had been refusing insulin. -She is now agreeable. -CBGs remain elevated. -Increase lantus to 10.  E Coli UTI -Completed course of levaquin.   Time spent coordinating care: 35 minutes   LOS: 10 days   [name],[name] [facility] Pager: [phone] 07/01/2012, 2:19 PM

## 2012-07-01 NOTE — Progress Notes (Signed)
Inpatient Diabetes Program Recommendations  AACE/ADA: New Consensus Statement on Inpatient Glycemic Control (2013)  Target Ranges:  Prepandial:   less than 140 mg/dL      Peak postprandial:   less than 180 mg/dL (1-2 hours)      Critically ill patients:  140 - 180 mg/dL   Reason for Visit: Hyperglycemia  Pt states she's had diabetes for 20 years and hasn't had any problems.  Complaints about CHO-mod diet, states it's "way too many carbs because I know all of the diet stuff." Does not have PCP.  Results for ROSELIND, DESANTOS (MRN [id number]) as of 07/01/2012 18:11  Ref. Range 06/30/2012 12:13 06/30/2012 16:52 06/30/2012 21:17 07/01/2012 07:53 07/01/2012 11:56 07/01/2012 16:44  Glucose-Capillary Latest Range: 70-99 mg/dL 147 (H) 829 (H) 562 (H) 192 (H) 266 (H) 136 (H)  Results for ENNIFER, TELLIER (MRN [id number]) as of 07/01/2012 18:11  Ref. Range 06/27/2012 14:30  Hemoglobin A1C Latest Range: <5.7 % 9.0 (H)   Results for JOANN, GARNAND (MRN [id number]) as of 07/01/2012 18:11  Ref. Range 06/30/2012 05:00 07/01/2012 04:35  Sodium Latest Range: 135-145 mEq/L 136 138  Potassium Latest Range: 3.5-5.1 mEq/L 3.8 3.9  Chloride Latest Range: 96-112 mEq/L 93 (L) 96  CO2 Latest Range: 19-32 mEq/L 34 (H) 34 (H)  BUN Latest Range: 6-23 mg/dL 27 (H) 26 (H)  Creatinine Latest Range: 0.50-1.10 mg/dL 1.32 (H) 4.40 (H)  Calcium Latest Range: 8.4-10.5 mg/dL 10.2 (H) 72.5 (H)  GFR calc non Af Amer Latest Range: >90 mL/min 39 (L) 41 (L)  GFR calc Af Amer Latest Range: >90 mL/min 45 (L) 47 (L)  Glucose Latest Range: 70-99 mg/dL 366 (H) 440 (H)  Uncontrolled DM with HgbA1C of 9.0.  Pt in denial of poor glycemic control and states it's only because she's eating hospital food and controls it at home. Discussed HgbA1C test, importance of obtaining PCP to manage DM, diabetes meds, diet and exercise. Uninterested in discussing her diabetes. Will order Living Well With Diabetes book.  Recommendations:  May benefit  from small dose Lantus at home.  Doubtful pt will be compliant with meds, but needs to be followed by a PCP.  Discussed with RN.

## 2012-07-01 NOTE — Progress Notes (Signed)
ANTICOAGULATION CONSULT NOTE - Follow Up Consult  Pharmacy Consult for Warfarin Indication: new DVT  Allergies  Allergen Reactions  . Penicillins Anaphylaxis  . Sulfa Antibiotics Anaphylaxis   Patient Measurements: Height: 5\' 6"  (167.6 cm) Weight: 174 lb 6.1 oz (79.1 kg) IBW/kg (Calculated) : 59.3   Labs:  Basename 07/01/12 0435 06/30/12 0500 06/29/12 0448  HGB -- -- 10.2*  HCT -- -- 32.4*  PLT -- -- 254  APTT -- -- --  LABPROT 23.9* 22.2* 23.0*  INR 2.25* 2.04* 2.14*  HEPARINUNFRC -- -- --  CREATININE 1.34* 1.38* 1.51*  CKTOTAL -- -- --  CKMB -- -- --  TROPONINI -- -- --    Estimated Creatinine Clearance: 44.4 ml/min (by C-G formula based on Cr of 1.34).  Assessment:  72 yof presented 10/29 with BLE swelling x 2 weeks, mild DOE.  10/30, preliminary result with + DVT in posterior tibial vein, heparin and warfarin started.  Patient completed minimal heparin/warfarin bridge 11/4.    INR therapeutic today.  Average coumadin doses for the past 7 days = 5mg . INR has been therapeutic for 5 days.  Last antibiotic doses were on 11/7.   Pharmacy completed warfarin education.  Goal of Therapy:  INR 2-3   Plan:   Coumadin 5mg  PO today at 1800.  Daily PT/INR  Pharmacy will f/u   Al Decant, PharmD Candidate 07/01/2012 10:14

## 2012-07-02 ENCOUNTER — Other Ambulatory Visit (HOSPITAL_COMMUNITY): Payer: Self-pay

## 2012-07-02 ENCOUNTER — Inpatient Hospital Stay (HOSPITAL_COMMUNITY): Payer: Medicare Other

## 2012-07-02 LAB — PROTIME-INR
INR: 3.06 — ABNORMAL HIGH (ref 0.00–1.49)
Prothrombin Time: 30 seconds — ABNORMAL HIGH (ref 11.6–15.2)

## 2012-07-02 LAB — GLUCOSE, CAPILLARY

## 2012-07-02 MED ORDER — LOPERAMIDE HCL 2 MG PO CAPS
2.0000 mg | ORAL_CAPSULE | Freq: Once | ORAL | Status: AC
  Administered 2012-07-02: 2 mg via ORAL
  Filled 2012-07-02: qty 1

## 2012-07-02 MED ORDER — REGADENOSON 0.4 MG/5ML IV SOLN
0.4000 mg | Freq: Once | INTRAVENOUS | Status: AC
  Administered 2012-07-02: 0.4 mg via INTRAVENOUS
  Filled 2012-07-02: qty 5

## 2012-07-02 MED ORDER — TECHNETIUM TC 99M SESTAMIBI GENERIC - CARDIOLITE
30.0000 | Freq: Once | INTRAVENOUS | Status: AC | PRN
  Administered 2012-07-02: 30 via INTRAVENOUS

## 2012-07-02 MED ORDER — TECHNETIUM TC 99M SESTAMIBI GENERIC - CARDIOLITE
10.0000 | Freq: Once | INTRAVENOUS | Status: AC | PRN
  Administered 2012-07-02: 10 via INTRAVENOUS

## 2012-07-02 NOTE — Progress Notes (Signed)
Patient is extremely focused on her bowels. She vacillates between complaints of  constipation or diarrhea from hour to hour. She also keeps placing her fingers in her feces and moving it around on her legs.While up to the bedside commode, patient's telemetry box fell and hit her right greater toe resulting in a small cut. Pressure dressing applied.  Erskin Burnet RN

## 2012-07-02 NOTE — Progress Notes (Signed)
ANTICOAGULATION CONSULT NOTE - Follow Up Consult  Pharmacy Consult for Warfarin Indication: New DVT  Allergies  Allergen Reactions  . Penicillins Anaphylaxis  . Sulfa Antibiotics Anaphylaxis   Patient Measurements: Height: 5\' 6"  (167.6 cm) Weight: 174 lb 13.2 oz (79.3 kg) IBW/kg (Calculated) : 59.3   Labs:  Basename 07/02/12 0544 07/01/12 0435 06/30/12 0500  HGB -- -- --  HCT -- -- --  PLT -- -- --  APTT -- -- --  LABPROT 30.0* 23.9* 22.2*  INR 3.06* 2.25* 2.04*  HEPARINUNFRC -- -- --  CREATININE -- 1.34* 1.38*  CKTOTAL -- -- --  CKMB -- -- --  TROPONINI -- -- --    Estimated Creatinine Clearance: 44.5 ml/min (by C-G formula based on Cr of 1.34).  Assessment:  21 yof presented 10/29 with BLE swelling x 2 weeks, mild DOE.  10/30, preliminary result with + DVT in posterior tibial vein, heparin and warfarin started.  Patient completed minimal heparin/warfarin bridge 11/4.    INR slightly supratherapeutic today.  INR has been therapeutic for the past 6 days.  Last antibiotic doses were on 11/7.   Pharmacy completed warfarin education previously during this admission.  Goal of Therapy:  INR 2-3   Plan:   No warfarin today - let INR drift back down to <3.  Daily PT/INR  Pharmacy will f/u  Darrol Angel, PharmD Pager: [phone] 07/02/2012 8:37 AM

## 2012-07-02 NOTE — Progress Notes (Signed)
[facility]  Subjective: No complaints.  No SOB  Objective: Vital signs in last 24 hours: Temp:  [98.1 F (36.7 C)-98.5 F (36.9 C)] 98.1 F (36.7 C) (11/09 0533) Pulse Rate:  [66-88] 79  (11/09 0533) Resp:  [18] 18  (11/09 0533) BP: (96-119)/(42-58) 110/58 mmHg (11/09 0734) SpO2:  [91 %-95 %] 91 % (11/09 0533) Weight:  [79.3 kg (174 lb 13.2 oz)] 79.3 kg (174 lb 13.2 oz) (11/09 0533) Last BM Date: 07/01/12  Intake/Output from previous day: 11/08 0701 - 11/09 0700 In: 600 [P.O.:600] Out: -  Intake/Output this shift:    Medications Current Facility-Administered Medications  Medication Dose Route Frequency Provider Last Rate Last Dose  . aspirin EC tablet 81 mg  81 mg Oral Daily Nada Boozer, NP   81 mg at 06/30/12 1058  . atorvastatin (LIPITOR) tablet 10 mg  10 mg Oral q1800 Henderson Cloud, MD   10 mg at 07/01/12 1708  . bisacodyl (DULCOLAX) suppository 10 mg  10 mg Rectal Daily PRN Henderson Cloud, MD   10 mg at 06/30/12 2327  . carvedilol (COREG) tablet 3.125 mg  3.125 mg Oral BID WC Nada Boozer, NP   3.125 mg at 07/02/12 0734  . docusate sodium (COLACE) capsule 100 mg  100 mg Oral BID Houston Siren, MD   100 mg at 07/01/12 2127  . feeding supplement (ENSURE COMPLETE) liquid 237 mL  237 mL Oral BID BM Jeoffrey Massed, RD   237 mL at 07/01/12 1604  . furosemide (LASIX) injection 40 mg  40 mg Intravenous BID Dorothea Ogle, MD   40 mg at 07/02/12 0735  . insulin aspart (novoLOG) injection 0-9 Units  0-9 Units Subcutaneous TID WC Dorothea Ogle, MD   2 Units at 07/02/12 0800  . insulin glargine (LANTUS) injection 10 Units  10 Units Subcutaneous QHS Henderson Cloud, MD   10 Units at 07/01/12 2127  . living well with diabetes book MISC   Does not apply Once Provider Default, MD      . [address] LORazepam (ATIVAN) tablet 0.5 mg  0.5 mg Oral Once Jinger Neighbors, NP   0.5 mg at 07/01/12 2259  . morphine 2 MG/ML injection 2 mg  2  mg Intravenous Q2H PRN Houston Siren, MD   2 mg at 06/26/12 0008  . ondansetron (ZOFRAN) tablet 4 mg  4 mg Oral Q6H PRN Houston Siren, MD   4 mg at 06/29/12 0128   Or  . ondansetron (ZOFRAN) injection 4 mg  4 mg Intravenous Q6H PRN Houston Siren, MD   4 mg at 06/29/12 1734  . oxyCODONE-acetaminophen (PERCOCET/ROXICET) 5-325 MG per tablet 1-2 tablet  1-2 tablet Oral Q4H PRN Dorothea Ogle, MD   1 tablet at 07/02/12 0714  . polyethylene glycol (MIRALAX / GLYCOLAX) packet 17 g  17 g Oral Daily Henderson Cloud, MD   17 g at 06/30/12 1058  . sodium chloride 0.9 % injection 3 mL  3 mL Intravenous Q12H Houston Siren, MD   3 mL at 07/01/12 2200  . spironolactone (ALDACTONE) tablet 12.5 mg  12.5 mg Oral BID Nada Boozer, NP   12.5 mg at 07/02/12 0734  . [COMPLETED] warfarin (COUMADIN) tablet 5 mg  5 mg Oral ONCE-1800 Christine E Shade, PHARMD   5 mg at 07/01/12 1710  . Warfarin - Pharmacist Dosing Inpatient   Does not apply q1800 Theda Sers, PHARMD      . [  DISCONTINUED] insulin glargine (LANTUS) injection 5 Units  5 Units Subcutaneous QHS Henderson Cloud, MD   5 Units at 06/30/12 2247    PE: General appearance: alert, cooperative and no distress  Lungs: Bilateral mild rales Heart: regular rate and rhythm, S1, S2 normal, no murmur, click, rub or gallop  Extremities: 2+ LEE  Pulses: 2+ left radial, 0 right radial, right PT 2+, left PT 1+  Skin: errythema noted in the right foot.  Neurologic: Grossly normal   Lab Results:  No results found for this basename: WBC:3,HGB:3,HCT:3,PLT:3 in the last 72 hours BMET  Basename 07/01/12 0435 06/30/12 0500  NA 138 136  K 3.9 3.8  CL 96 93*  CO2 34* 34*  GLUCOSE 218* 175*  BUN 26* 27*  CREATININE 1.34* 1.38*  CALCIUM 11.9* 12.1*   PT/INR  Basename 07/02/12 0544 07/01/12 0435 06/30/12 0500  LABPROT 30.0* 23.9* 22.2*  INR 3.06* 2.25* 2.04*   Lipid Panel  No results found for this basename: chol, trig, hdl, cholhdl, vldl, ldlcalc        Assessment/Plan  Principal Problem:  *Acute combined systolic and diastolic CHF, NYHA class 2 Active Problems:  Hypoxemia  Pedal edema  Cellulitis  CHF (congestive heart failure)  LBBB (left bundle branch block)  DVT (deep venous thrombosis)  DM (diabetes mellitus)  PVD (peripheral vascular disease)  CKD (chronic kidney disease) stage 3, GFR 30-59 ml/min  Cardiomyopathy, EF 25%  Popliteal artery thrombosis, right  Plan:  Lexiscan myoview today.   PTH in process still.   BP and HR stable.  Patient tolerated the Myoview well.  Asymptomatic.  Left and right arm BPs about equal.   LOS: 11 days    HAGER, BRYAN 07/02/2012 9:35 AM    Patient seen and examined. Agree with assessment and plan. No chest pain. Myoview done to assess ischemic etiology to cardiomyopathy. Doing well with diuresis and aldosterone blockade. Add ACE-I or ARB as BP and renal function allow.  Hypercalcemic; will check Phos and PTH. Await myoview imaging; suspect may ultimately need cath.   Lennette Bihari, MD, [facility] 07/02/2012 2:22 PM

## 2012-07-02 NOTE — Progress Notes (Signed)
[facility]             Progress Note   Subjective: No current complaints.  Objective: Vital signs in last 24 hours: Temp:  [97.3 F (36.3 C)-98.5 F (36.9 C)] 97.3 F (36.3 C) (11/09 1315) Pulse Rate:  [75-88] 75  (11/09 1315) Resp:  [16-18] 16  (11/09 1315) BP: (92-119)/(48-66) 111/64 mmHg (11/09 1315) SpO2:  [91 %-97 %] 97 % (11/09 1315) Weight:  [79.3 kg (174 lb 13.2 oz)] 79.3 kg (174 lb 13.2 oz) (11/09 0533) Weight change: 0.2 kg (7.1 oz) Last BM Date: 07/01/12  Intake/Output from previous day: 11/08 0701 - 11/09 0700 In: 600 [P.O.:600] Out: -      Physical Exam: General: Alert, awake, oriented x3. HEENT: No bruits, no goiter. Heart: Regular rate and rhythm, without murmurs, rubs, gallops. Lungs: Mild bibasilar crackles. Abdomen: Soft, nontender, nondistended, positive bowel sounds. Extremities: 2+ edema to midshins bilaterally. No palpable pulse (or by doppler) to right foot that is cold to touch. Neuro: Grossly intact, nonfocal.    Lab Results: Basic Metabolic Panel:  Basename 07/01/12 0435 06/30/12 0500  NA 138 136  K 3.9 3.8  CL 96 93*  CO2 34* 34*  GLUCOSE 218* 175*  BUN 26* 27*  CREATININE 1.34* 1.38*  CALCIUM 11.9* 12.1*  MG -- 1.6  PHOS -- --   CBC: No results found for this basename: WBC:2,NEUTROABS:2,HGB:2,HCT:2,MCV:2,PLT:2 in the last 72 hours BNP:  Basename 06/30/12 0501  PROBNP 12411.0*   CBG:  Basename 07/02/12 1335 07/02/12 0749 07/01/12 2109 07/01/12 1644 07/01/12 1156 07/01/12 0753  GLUCAP 142* 165* 266* 136* 266* 192*   Hemoglobin A1C: No results found for this basename: HGBA1C in the last 72 hours Coagulation:  Basename 07/02/12 0544 07/01/12 0435  LABPROT 30.0* 23.9*  INR 3.06* 2.25*    Recent Results (from the past 240 hour(s))  URINE CULTURE     Status: Normal   Collection Time   06/25/12  6:28 PM      Component Value Range Status Comment   Specimen Description URINE, CLEAN CATCH   Final    Special Requests NONE   Final    Culture  Setup Time 06/26/2012 15:42   Final    Colony Count >=100,000 COLONIES/ML   Final    Culture ESCHERICHIA COLI   Final    Report Status 06/28/2012 FINAL   Final    Organism ID, Bacteria ESCHERICHIA COLI   Final        Medications: Scheduled Meds:    . aspirin EC  81 mg Oral Daily  . atorvastatin  10 mg Oral q1800  . carvedilol  3.125 mg Oral BID WC  . docusate sodium  100 mg Oral BID  . feeding supplement  237 mL Oral BID BM  . furosemide  40 mg Intravenous BID  . insulin aspart  0-9 Units Subcutaneous TID WC  . insulin glargine  10 Units Subcutaneous QHS  . living well with diabetes book   Does not apply Once  . [COMPLETED] loperamide  2 mg Oral Once  . [COMPLETED] LORazepam  0.5 mg Oral Once  . polyethylene glycol  17 g Oral Daily  . [COMPLETED] regadenoson  0.4 mg Intravenous Once  . sodium chloride  3 mL Intravenous Q12H  . spironolactone  12.5 mg Oral BID  . [COMPLETED] warfarin  5 mg Oral ONCE-1800  . Warfarin - Pharmacist Dosing Inpatient   Does not apply q1800   Continuous Infusions:  PRN Meds:.bisacodyl, morphine  injection, ondansetron (ZOFRAN) IV, ondansetron, oxyCODONE-acetaminophen, [COMPLETED] technetium sestamibi generic, [COMPLETED] technetium sestamibi generic  Assessment/Plan:  Principal Problem:  *Acute combined systolic and diastolic CHF, NYHA class 2 Active Problems:  Hypoxemia  Pedal edema  Cellulitis  CHF (congestive heart failure)  LBBB (left bundle branch block)  DVT (deep venous thrombosis)  DM (diabetes mellitus)  PVD (peripheral vascular disease)  CKD (chronic kidney disease) stage 3, GFR 30-59 ml/min  Cardiomyopathy, EF 25%  Popliteal artery thrombosis, right   Acute Combined CHF -New diagnosis. -EF 25% per ECHO -Has been seen by cardiology. -Myoview done today. Results pending. -Continue lasix at current dose. -She is about 10 L negative since admission. -On ASA/BB/statin. -No ACE-I  given renal failure.  Acute Bilateral LE DVT -On coumadin. -INR therapeutic. -Pharmacy dosing.  Popliteal Arterial Occlusion -Diagnosed via arterial duplex. -Has been seen by vascular, Dr. Hart [place]. -Given acute ongoing medical issues, she is not a candidate for intervention at this time.  Acute on CKD Stage III -Her baseline creatinine appears to be in the 1.4-1.5 range. -Likely related to long-time uncontrolled DM. -Recheck in am.  DM -Patient had been refusing insulin. -She is now agreeable. -CBGs remain elevated, but improved. -Continue lantus 10 today.  E Coli UTI -Completed course of levaquin.   Time spent coordinating care: 25 minutes   LOS: 11 days   HERNANDEZ ACOSTA,ESTELA [facility] Pager: [phone] 07/02/2012, 3:51 PM

## 2012-07-03 LAB — BASIC METABOLIC PANEL
BUN: 24 mg/dL — ABNORMAL HIGH (ref 6–23)
Calcium: 11.4 mg/dL — ABNORMAL HIGH (ref 8.4–10.5)
Creatinine, Ser: 1.19 mg/dL — ABNORMAL HIGH (ref 0.50–1.10)
GFR calc Af Amer: 54 mL/min — ABNORMAL LOW (ref >90)
GFR calc non Af Amer: 47 mL/min — ABNORMAL LOW (ref >90)

## 2012-07-03 LAB — GLUCOSE, CAPILLARY
Glucose-Capillary: 152 mg/dL — ABNORMAL HIGH (ref 70–99)
Glucose-Capillary: 238 mg/dL — ABNORMAL HIGH (ref 70–99)

## 2012-07-03 LAB — PROTIME-INR: Prothrombin Time: 29 seconds — ABNORMAL HIGH (ref 11.6–15.2)

## 2012-07-03 MED ORDER — WARFARIN SODIUM 4 MG PO TABS
4.0000 mg | ORAL_TABLET | Freq: Once | ORAL | Status: DC
  Filled 2012-07-03: qty 1

## 2012-07-03 MED ORDER — INSULIN GLARGINE 100 UNIT/ML ~~LOC~~ SOLN
15.0000 [IU] | Freq: Every day | SUBCUTANEOUS | Status: DC
  Administered 2012-07-03: 15 [IU] via SUBCUTANEOUS

## 2012-07-03 NOTE — Progress Notes (Signed)
[facility]             Progress Note   Subjective: No current complaints.  Objective: Vital signs in last 24 hours: Temp:  [97.3 F (36.3 C)-98.4 F (36.9 C)] 98.4 F (36.9 C) (11/10 0502) Pulse Rate:  [75-80] 80  (11/10 0502) Resp:  [16-18] 18  (11/10 0502) BP: (105-111)/(49-64) 107/49 mmHg (11/10 0502) SpO2:  [94 %-97 %] 95 % (11/10 0502) Weight:  [73.9 kg (162 lb 14.7 oz)] 73.9 kg (162 lb 14.7 oz) (11/10 0502) Weight change: -5.4 kg (-11 lb 14.5 oz) Last BM Date: 07/02/12  Intake/Output from previous day: 11/09 0701 - 11/10 0700 In: 284 [P.O.:240; IV Piggyback:44] Out: 600 [Urine:600] Total I/O In: 240 [P.O.:240] Out: -    Physical Exam: General: Alert, awake, oriented x3. HEENT: No bruits, no goiter. Heart: Regular rate and rhythm, without murmurs, rubs, gallops. Lungs: Mild bibasilar crackles. Abdomen: Soft, nontender, nondistended, positive bowel sounds. Extremities: 2+ edema to midshins bilaterally. No palpable pulse to right foot that is cold to touch. Neuro: Grossly intact, nonfocal.    Lab Results: Basic Metabolic Panel:  Basename 07/03/12 0513 07/01/12 0435  NA 136 138  K 4.2 3.9  CL 96 96  CO2 32 34*  GLUCOSE 269* 218*  BUN 24* 26*  CREATININE 1.19* 1.34*  CALCIUM 11.4* 11.9*  MG -- --  PHOS 2.7 --   CBG:  Basename 07/03/12 0721 07/02/12 2109 07/02/12 1724 07/02/12 1335 07/02/12 0749 07/01/12 2109  GLUCAP 205* 190* 207* 142* 165* 266*   Coagulation:  Basename 07/03/12 0513 07/02/12 0544  LABPROT 29.0* 30.0*  INR 2.92* 3.06*    Recent Results (from the past 240 hour(s))  URINE CULTURE     Status: Normal   Collection Time   06/25/12  6:28 PM      Component Value Range Status Comment   Specimen Description URINE, CLEAN CATCH   Final    Special Requests NONE   Final    Culture  Setup Time 06/26/2012 15:42   Final    Colony Count >=100,000 COLONIES/ML   Final    Culture ESCHERICHIA COLI   Final    Report Status  06/28/2012 FINAL   Final    Organism ID, Bacteria ESCHERICHIA COLI   Final        Medications: Scheduled Meds:    . aspirin EC  81 mg Oral Daily  . atorvastatin  10 mg Oral q1800  . carvedilol  3.125 mg Oral BID WC  . docusate sodium  100 mg Oral BID  . feeding supplement  237 mL Oral BID BM  . furosemide  40 mg Intravenous BID  . insulin aspart  0-9 Units Subcutaneous TID WC  . insulin glargine  10 Units Subcutaneous QHS  . living well with diabetes book   Does not apply Once  . [COMPLETED] loperamide  2 mg Oral Once  . [COMPLETED] loperamide  2 mg Oral Once  . polyethylene glycol  17 g Oral Daily  . sodium chloride  3 mL Intravenous Q12H  . spironolactone  12.5 mg Oral BID  . warfarin  4 mg Oral ONCE-1800  . Warfarin - Pharmacist Dosing Inpatient   Does not apply q1800   Continuous Infusions:  PRN Meds:.bisacodyl, morphine injection, ondansetron (ZOFRAN) IV, ondansetron, oxyCODONE-acetaminophen  Assessment/Plan:  Principal Problem:  *Acute combined systolic and diastolic CHF, NYHA class 2 Active Problems:  Hypoxemia  Pedal edema  Cellulitis  CHF (congestive heart failure)  LBBB (left bundle  branch block)  DVT (deep venous thrombosis)  DM (diabetes mellitus)  PVD (peripheral vascular disease)  CKD (chronic kidney disease) stage 3, GFR 30-59 ml/min  Cardiomyopathy, EF 25%  Popliteal artery thrombosis, right   Acute Combined CHF -New diagnosis. -EF 25% per ECHO -Has been seen by cardiology. -Myoview done: global hypokinesis, EF 13%. Await cards input, but I believe she will eventually need a cath. -Continue lasix at current dose. -She is about 10 L negative since admission. -On ASA/BB/statin. -No ACE-I given renal failure. As renal failure is improving, may consider starting low dose ACE-I to document tolerance.  Acute Bilateral LE DVT -On coumadin. -INR therapeutic. -Pharmacy dosing.  Popliteal Arterial Occlusion -Diagnosed via arterial duplex. -Has  been seen by vascular, Dr. Hart Rochester. -Given acute ongoing medical issues, she is not a candidate for intervention at this time.  Acute on CKD Stage III -Her baseline creatinine appears to be in the 1.4-1.5 range. -Likely related to long-time uncontrolled DM. -Seems to be improving. -Will continue to follow but if improves may consider starting ACE-I.  DM -Patient had been refusing insulin. -She is now agreeable. -CBGs remain elevated. -Increase lantus to 15.  E Coli UTI -Completed course of levaquin.   Time spent coordinating care: 25 minutes   LOS: 12 days   HERNANDEZ ACOSTA,ESTELA [facility] Pager: [phone] 07/03/2012, 12:54 PM

## 2012-07-03 NOTE — Progress Notes (Addendum)
ANTICOAGULATION CONSULT NOTE - Follow Up Consult  Pharmacy Consult for Warfarin Indication: New DVT  Allergies  Allergen Reactions  . Penicillins Anaphylaxis  . Sulfa Antibiotics Anaphylaxis   Patient Measurements: Height: 5\' 6"  (167.6 cm) Weight: 162 lb 14.7 oz (73.9 kg) (STANDING SCALE) IBW/kg (Calculated) : 59.3   Labs:  Basename 07/03/12 0513 07/02/12 0544 07/01/12 0435  HGB -- -- --  HCT -- -- --  PLT -- -- --  APTT -- -- --  LABPROT 29.0* 30.0* 23.9*  INR 2.92* 3.06* 2.25*  HEPARINUNFRC -- -- --  CREATININE 1.19* -- 1.34*  CKTOTAL -- -- --  CKMB -- -- --  TROPONINI -- -- --    Estimated Creatinine Clearance: 48.4 ml/min (by C-G formula based on Cr of 1.19).  Assessment:  66 yo F presented 10/29 with BLE swelling x 2 weeks, mild DOE.  10/30, preliminary result with + DVT in posterior tibial vein, heparin and warfarin started.  Patient completed minimal heparin/warfarin bridge 11/4.    INR back in goal range today - will resume warfarin dosing using lower dose tonight  Last antibiotic doses were on 11/7.   Pharmacy completed warfarin education previously during this admission.  No bleeding events reported in chart notes  Goal of Therapy:  INR 2-3   Plan:   Warfarin 4mg  PO x1 at 18:00 tonight  Daily PT/INR  Pharmacy will f/u  Darrol Angel, PharmD Pager: [phone] 07/03/2012 7:30 AM   ADDENDUM: Order to hold coumadin for anticipated cath. Plan is to start IV heparin once INR <2. Since current INR = 2.92, will NOT begin heparin at this time.  New Plan: 1) D/C warfarin 2) Daily INR  3) Start IV heparin once INR<2. 4) F/U post-cath anticoag plans.  Darrol Angel, PharmD Pager: [phone] 07/03/2012 1:38 PM

## 2012-07-03 NOTE — Progress Notes (Signed)
[facility] Progress Note  Subjective:  No chest pain  Objective:   Vital Signs in the last 24 hours: Temp:  [97.3 F (36.3 C)-98.4 F (36.9 C)] 98.4 F (36.9 C) (11/10 0502) Pulse Rate:  [75-80] 80  (11/10 0502) Resp:  [16-18] 18  (11/10 0502) BP: (105-111)/(49-64) 107/49 mmHg (11/10 0502) SpO2:  [94 %-97 %] 95 % (11/10 0502) Weight:  [73.9 kg (162 lb 14.7 oz)] 73.9 kg (162 lb 14.7 oz) (11/10 0502)  Intake/Output from previous day: 11/09 0701 - 11/10 0700 In: 284 [P.O.:240; IV Piggyback:44] Out: 600 [Urine:600]  Scheduled:   . aspirin EC  81 mg Oral Daily  . atorvastatin  10 mg Oral q1800  . carvedilol  3.125 mg Oral BID WC  . docusate sodium  100 mg Oral BID  . feeding supplement  237 mL Oral BID BM  . furosemide  40 mg Intravenous BID  . insulin aspart  0-9 Units Subcutaneous TID WC  . insulin glargine  10 Units Subcutaneous QHS  . living well with diabetes book   Does not apply Once  . [COMPLETED] loperamide  2 mg Oral Once  . [COMPLETED] loperamide  2 mg Oral Once  . polyethylene glycol  17 g Oral Daily  . sodium chloride  3 mL Intravenous Q12H  . spironolactone  12.5 mg Oral BID  . warfarin  4 mg Oral ONCE-1800  . Warfarin - Pharmacist Dosing Inpatient   Does not apply q1800    Physical Exam:   General appearance: alert, cooperative and no distress Neck: no JVD Lungs: decreased BS at bases with faint rales Heart: regular rate and rhythm 1/6 sem Abdomen: soft, non-tender; bowel sounds normal; no masses,  no organomegaly Extremities: edema resolved   Rate: 80  Rhythm: normal sinus rhythm  Lab Results:    Basename 07/03/12 0513 07/01/12 0435  NA 136 138  K 4.2 3.9  CL 96 96  CO2 32 34*  GLUCOSE 269* 218*  BUN 24* 26*  CREATININE 1.19* 1.34*   No results found for this basename: TROPONINI:2,CK,MB:2 in the last 72 hours Hepatic Function Panel No results found for this basename:  PROT,ALBUMIN,AST,ALT,ALKPHOS,BILITOT,BILIDIR,IBILI in the last 72 hours  Basename 07/03/12 0513  INR 2.92*    Lipid Panel  No results found for this basename: chol, trig, hdl, cholhdl, vldl, ldlcalc     Imaging:  Nm Myocar Multi W/spect W/wall Motion / Ef  07/02/2012  *RADIOLOGY REPORT*  Clinical Data:  Chest pain.  MYOCARDIAL IMAGING WITH SPECT (REST AND PHARMACOLOGIC-STRESS) GATED LEFT VENTRICULAR WALL MOTION STUDY LEFT VENTRICULAR EJECTION FRACTION  Technique:  Standard myocardial SPECT imaging was performed after resting intravenous injection of 11 mCi Tc-12m sestamibi. Subsequently, intravenous infusion of Lexiscan was performed under the supervision of the Cardiology staff.  At peak effect of the drug, 33 mCi Tc-36m sestamibi was injected intravenously and standard myocardial SPECT  imaging was performed.  Quantitative gated imaging was also performed to evaluate left ventricular wall motion, and estimate left ventricular ejection fraction.  Comparison:  None  Findings: Utilizing gated data, the end-diastolic volume is estimated to be 177 ml and the end-systolic volume 154 ml. Calculated ejection fraction is 13%.  Gated wall motion analysis demonstrates severe global hypokinesis. The septum may be mildly dyskinetic.  SPECT imaging shows no evidence of inducible ischemia with Lexiscan administration.  There is a focal scar noted in the distal anterior and anteroseptal wall.  IMPRESSION:  1.  Severe left ventricular  dysfunction and cardiomyopathy with estimated quantitative ejection fraction of 13%.  Wall motion analysis demonstrates severe global hypokinesis and mildly dyskinetic septum. 2.  No evidence of inducible ischemia.  Distal anterior/anteroseptal wall scar present.   Original Report Authenticated By: Irish Lack, M.D.       Assessment/Plan:   Principal Problem:  *Acute combined systolic and diastolic CHF, NYHA class 2 Active Problems:  Hypoxemia  Pedal edema  Cellulitis   CHF (congestive heart failure)  LBBB (left bundle branch block)  DVT (deep venous thrombosis)  DM (diabetes mellitus)  PVD (peripheral vascular disease)  CKD (chronic kidney disease) stage 3, GFR 30-59 ml/min  Cardiomyopathy, EF 25%  Popliteal artery thrombosis, right   Pt therapeutic on coumadin for DVT.Nuclear scan with EF 13% with scar and CT scan with coronary calcification suggesting anischemic etiology to LV dysfunction.  Discussed R and L heart cath with pt who agrees to proceed. Will hold coumadin, start heparin when PT < 2.0 and plan cath when <1.7.   Lennette Bihari, MD, [facility] 07/03/2012, 1:09 PM

## 2012-07-04 LAB — BASIC METABOLIC PANEL
Chloride: 96 mEq/L (ref 96–112)
Creatinine, Ser: 1.18 mg/dL — ABNORMAL HIGH (ref 0.50–1.10)
GFR calc Af Amer: 55 mL/min — ABNORMAL LOW (ref >90)
Potassium: 4.3 mEq/L (ref 3.5–5.1)
Sodium: 134 mEq/L — ABNORMAL LOW (ref 135–145)

## 2012-07-04 LAB — GLUCOSE, CAPILLARY: Glucose-Capillary: 247 mg/dL — ABNORMAL HIGH (ref 70–99)

## 2012-07-04 LAB — PTH, INTACT AND CALCIUM
Calcium, Total (PTH): 11.1 mg/dL — ABNORMAL HIGH (ref 8.4–10.5)
PTH: 129.8 pg/mL — ABNORMAL HIGH (ref 14.0–72.0)

## 2012-07-04 LAB — PROTIME-INR
INR: 2.03 — ABNORMAL HIGH (ref 0.00–1.49)
Prothrombin Time: 22.1 seconds — ABNORMAL HIGH (ref 11.6–15.2)

## 2012-07-04 LAB — PARATHYROID HORMONE, INTACT (NO CA): PTH: 98.5 pg/mL — ABNORMAL HIGH (ref 14.0–72.0)

## 2012-07-04 MED ORDER — HEPARIN (PORCINE) IN NACL 100-0.45 UNIT/ML-% IJ SOLN
1100.0000 [IU]/h | INTRAMUSCULAR | Status: DC
  Administered 2012-07-04: 1100 [IU]/h via INTRAVENOUS
  Filled 2012-07-04: qty 250

## 2012-07-04 MED ORDER — INSULIN GLARGINE 100 UNIT/ML ~~LOC~~ SOLN
18.0000 [IU] | Freq: Every day | SUBCUTANEOUS | Status: DC
  Administered 2012-07-04 – 2012-07-08 (×5): 18 [IU] via SUBCUTANEOUS

## 2012-07-04 MED ORDER — LORAZEPAM 0.5 MG PO TABS
0.2500 mg | ORAL_TABLET | Freq: Two times a day (BID) | ORAL | Status: DC | PRN
  Administered 2012-07-04: 0.25 mg via ORAL
  Filled 2012-07-04 (×2): qty 1

## 2012-07-04 NOTE — Progress Notes (Signed)
ANTICOAGULATION CONSULT NOTE - Follow Up Consult  Pharmacy Consult for heparin (to start when INR <2) Indication: New B/L DVT  Allergies  Allergen Reactions  . Penicillins Anaphylaxis  . Sulfa Antibiotics Anaphylaxis   Patient Measurements: Height: 5\' 6"  (167.6 cm) Weight: 156 lb 12 oz (71.1 kg) (STANDING SCALE) IBW/kg (Calculated) : 59.3   Labs:  Basename 07/04/12 0443 07/03/12 0513 07/02/12 0544  HGB -- -- --  HCT -- -- --  PLT -- -- --  APTT -- -- --  LABPROT 22.1* 29.0* 30.0*  INR 2.03* 2.92* 3.06*  HEPARINUNFRC -- -- --  CREATININE 1.18* 1.19* --  CKTOTAL -- -- --  CKMB -- -- --  TROPONINI -- -- --    Estimated Creatinine Clearance: 44.5 ml/min (by C-G formula based on Cr of 1.18).  Assessment:  66 yo F presented 10/29 with BLE swelling x 2 weeks, mild DOE.  10/30, preliminary result with + DVT in posterior tibial vein, heparin and warfarin started.  Patient completed minimal heparin/warfarin bridge 11/4.    Coumadin was d/c yesterday d/t cardiac findings and need for cardiac cath. Heparin to start when INR <2 and cath when INR <1.7  INR today is just above 2 (2.03)  Goal of Therapy:  Start heparin when INR <2   Plan:   F/U am INR  Initiate heparin gtt when INR <2  Gwen Her PharmD  [phone] 07/04/2012 7:49 AM

## 2012-07-04 NOTE — Progress Notes (Signed)
Pt. Seen and examined. Agree with the NP/PA-C note as written.  Plan for heparin when INR below 2.0 to bridge, however, will need INR <1.5 for LHC/RHC, perhaps Wednesday or Thursday this week.  Chrystie Nose, MD, [facility] Attending Cardiologist [facility]

## 2012-07-04 NOTE — Progress Notes (Signed)
Subjective:  No chest pain or SOB. Lower extremity edema improving  Objective:  Vital Signs in the last 24 hours: Temp:  [98.4 F (36.9 C)-99 F (37.2 C)] 98.4 F (36.9 C) (11/11 1300) Pulse Rate:  [80-87] 87  (11/11 1300) Resp:  [16-18] 17  (11/11 1300) BP: (108-109)/(55-67) 109/67 mmHg (11/11 1300) SpO2:  [96 %-97 %] 97 % (11/11 1300) Weight:  [71.1 kg (156 lb 12 oz)] 71.1 kg (156 lb 12 oz) (11/11 1610)  Intake/Output from previous day:  Intake/Output Summary (Last 24 hours) at 07/04/12 1733 Last data filed at 07/04/12 1500  Gross per 24 hour  Intake    972 ml  Output   1450 ml  Net   -478 ml    Physical Exam: General appearance: alert, cooperative and no distress Lungs: basilar rhonchi Heart: regular rate and rhythm trace bilat edema   Rate: 86  Rhythm: normal sinus rhythm  Lab Results: No results found for this basename: WBC:2,HGB:2,PLT:2 in the last 72 hours  Basename 07/04/12 0443 07/03/12 0513  NA 134* 136  K 4.3 4.2  CL 96 96  CO2 32 32  GLUCOSE 210* 269*  BUN 29* 24*  CREATININE 1.18* 1.19*   No results found for this basename: TROPONINI:2,CK,MB:2 in the last 72 hours Hepatic Function Panel No results found for this basename: PROT,ALBUMIN,AST,ALT,ALKPHOS,BILITOT,BILIDIR,IBILI in the last 72 hours No results found for this basename: CHOL in the last 72 hours  Basename 07/04/12 0443  INR 2.03*    Imaging: Imaging results have been reviewed  Cardiac Studies:  Assessment/Plan:   Principal Problem:  *Acute systolic CHF (congestive heart failure) Active Problems:  Cardiomyopathy, EF 25% by echo, 13% by Myoview  Pedal edema  Cellulitis  DVT Oct 2013  CKD (chronic kidney disease) stage 3, GFR 30-59 ml/min  Popliteal artery thrombosis, right  LBBB (left bundle branch block)  DM (diabetes mellitus)   Plan- INR 2.03 at 4am today. Will start Heparin as she has had recent thrombotic, and (?) embolic events. Plan for cath when INR less than 1.6 at  least, probably not tomorrow but will give her a full liquid breakfast then NPO till INR is back.   Corine Shelter PA-C 07/04/2012, 5:33 PM

## 2012-07-04 NOTE — Progress Notes (Signed)
MD progress note indicates that patient is agreeable to insulin.  Is patient going to be discharged home on insulin?  If so, please have nursing staff instruct patient regarding insulin preparation and administration.  Thank you. Vicie Cech S. Elsie Lincoln, RN, CNS, CDE Inpatient Diabetes Program, team pager [phone]

## 2012-07-04 NOTE — Progress Notes (Signed)
[facility]             Progress Note   Subjective: No current complaints.  Objective: Vital signs in last 24 hours: Temp:  [98.5 F (36.9 C)-99 F (37.2 C)] 99 F (37.2 C) (11/11 0635) Pulse Rate:  [80-87] 86  (11/11 0635) Resp:  [16-18] 16  (11/11 0635) BP: (103-109)/(54-64) 108/64 mmHg (11/11 0635) SpO2:  [96 %] 96 % (11/11 0635) Weight:  [71.1 kg (156 lb 12 oz)] 71.1 kg (156 lb 12 oz) (11/11 1610) Weight change: -2.8 kg (-6 lb 2.8 oz) Last BM Date: 07/03/12  Intake/Output from previous day: 11/10 0701 - 11/11 0700 In: 1400 [P.O.:1400] Out: 1151 [Urine:1150; Stool:1] Total I/O In: 240 [P.O.:240] Out: -    Physical Exam: General: Alert, awake, oriented x3. HEENT: No bruits, no goiter. Heart: Regular rate and rhythm, without murmurs, rubs, gallops. Lungs: Mild bibasilar crackles. Abdomen: Soft, nontender, nondistended, positive bowel sounds. Extremities: 1+ edema to midshins bilaterally. No palpable pulse to right foot that is cold to touch. Neuro: Grossly intact, nonfocal.    Lab Results: Basic Metabolic Panel:  Basename 07/04/12 0443 07/03/12 0513  NA 134* 136  K 4.3 4.2  CL 96 96  CO2 32 32  GLUCOSE 210* 269*  BUN 29* 24*  CREATININE 1.18* 1.19*  CALCIUM 10.8* 11.4*  MG -- --  PHOS -- 2.7   CBG:  Basename 07/04/12 1132 07/04/12 0708 07/03/12 2220 07/03/12 1621 07/03/12 1210 07/03/12 0721  GLUCAP 247* 184* 188* 238* 152* 205*   Coagulation:  Basename 07/04/12 0443 07/03/12 0513  LABPROT 22.1* 29.0*  INR 2.03* 2.92*    Recent Results (from the past 240 hour(s))  URINE CULTURE     Status: Normal   Collection Time   06/25/12  6:28 PM      Component Value Range Status Comment   Specimen Description URINE, CLEAN CATCH   Final    Special Requests NONE   Final    Culture  Setup Time 06/26/2012 15:42   Final    Colony Count >=100,000 COLONIES/ML   Final    Culture ESCHERICHIA COLI   Final    Report Status 06/28/2012 FINAL   Final     Organism ID, Bacteria ESCHERICHIA COLI   Final        Medications: Scheduled Meds:    . aspirin EC  81 mg Oral Daily  . atorvastatin  10 mg Oral q1800  . carvedilol  3.125 mg Oral BID WC  . docusate sodium  100 mg Oral BID  . feeding supplement  237 mL Oral BID BM  . furosemide  40 mg Intravenous BID  . insulin aspart  0-9 Units Subcutaneous TID WC  . insulin glargine  15 Units Subcutaneous QHS  . living well with diabetes book   Does not apply Once  . polyethylene glycol  17 g Oral Daily  . sodium chloride  3 mL Intravenous Q12H  . spironolactone  12.5 mg Oral BID  . [DISCONTINUED] insulin glargine  10 Units Subcutaneous QHS  . [DISCONTINUED] warfarin  4 mg Oral ONCE-1800  . [DISCONTINUED] Warfarin - Pharmacist Dosing Inpatient   Does not apply q1800   Continuous Infusions:  PRN Meds:.bisacodyl, morphine injection, ondansetron (ZOFRAN) IV, ondansetron, oxyCODONE-acetaminophen  Assessment/Plan:  Principal Problem:  *Acute combined systolic and diastolic CHF, NYHA class 2 Active Problems:  Hypoxemia  Pedal edema  Cellulitis  CHF (congestive heart failure)  LBBB (left bundle branch block)  DVT (deep venous thrombosis)  DM (diabetes mellitus)  PVD (peripheral vascular disease)  CKD (chronic kidney disease) stage 3, GFR 30-59 ml/min  Cardiomyopathy, EF 25%  Popliteal artery thrombosis, right   Acute Combined CHF -New diagnosis. -EF 25% per ECHO -Has been seen by cardiology. -Myoview done: global hypokinesis, EF 13%.  -Plan is for cardiac cath once INR has decreased. -Continue lasix at current dose. -She is about 10 L negative since admission. -On ASA/BB/statin. -No ACE-I given renal failure. As renal failure is improving, may consider starting low dose ACE-I soon to document tolerance.  Acute Bilateral LE DVT -On coumadin. -INR therapeutic. But we are holding coumadin now for cardiac cath.  Popliteal Arterial Occlusion -Diagnosed via arterial  duplex. -Has been seen by vascular, Dr. Hart Rochester. -Given acute ongoing medical issues, she is not a candidate for intervention at this time.  Acute on CKD Stage III -Her baseline creatinine appears to be in the 1.4-1.5 range. -Likely related to long-time uncontrolled DM. -Seems to be improving. -Will continue to follow but if improves may consider starting ACE-I soon.  DM -Patient had been refusing insulin. -She is now agreeable. -CBGs remain elevated. -Increase lantus to 18.  E Coli UTI -Completed course of levaquin.   Time spent coordinating care: 25 minutes   LOS: 13 days   HERNANDEZ ACOSTA,ESTELA [facility] Pager: [phone] 07/04/2012, 12:27 PM

## 2012-07-04 NOTE — Progress Notes (Signed)
ANTICOAGULATION CONSULT NOTE - Initial Consult  Pharmacy Consult for Heparin Indication: New B/L DVT   Allergies  Allergen Reactions  . Penicillins Anaphylaxis  . Sulfa Antibiotics Anaphylaxis    Patient Measurements: Height: 5\' 6"  (167.6 cm) Weight: 156 lb 12 oz (71.1 kg) (STANDING SCALE) IBW/kg (Calculated) : 59.3  Heparin Dosing Weight71.1kg  Vital Signs: Temp: 98.4 F (36.9 C) (11/11 1300) Temp src: Oral (11/11 0635) BP: 109/67 mmHg (11/11 1300) Pulse Rate: 87  (11/11 1300)  Labs:  Basename 07/04/12 0443 07/03/12 0513 07/02/12 0544  HGB -- -- --  HCT -- -- --  PLT -- -- --  APTT -- -- --  LABPROT 22.1* 29.0* 30.0*  INR 2.03* 2.92* 3.06*  HEPARINUNFRC -- -- --  CREATININE 1.18* 1.19* --  CKTOTAL -- -- --  CKMB -- -- --  TROPONINI -- -- --    Estimated Creatinine Clearance: 44.5 ml/min (by C-G formula based on Cr of 1.18).   Medical History: Past Medical History  Diagnosis Date  . Cardiomyopathy, EF 25% 06/29/2012    Medications:  Scheduled:    . aspirin EC  81 mg Oral Daily  . atorvastatin  10 mg Oral q1800  . carvedilol  3.125 mg Oral BID WC  . docusate sodium  100 mg Oral BID  . feeding supplement  237 mL Oral BID BM  . furosemide  40 mg Intravenous BID  . insulin aspart  0-9 Units Subcutaneous TID WC  . insulin glargine  18 Units Subcutaneous QHS  . living well with diabetes book   Does not apply Once  . polyethylene glycol  17 g Oral Daily  . sodium chloride  3 mL Intravenous Q12H  . spironolactone  12.5 mg Oral BID  . [DISCONTINUED] insulin glargine  15 Units Subcutaneous QHS   Infusions:   PRN: bisacodyl, morphine injection, ondansetron (ZOFRAN) IV, ondansetron, oxyCODONE-acetaminophen  Assessment: 66 yo F presented 10/29 with BLE swelling x 2 weeks, mild DOE. 10/30, preliminary result with + DVT in posterior tibial vein, heparin and warfarin started.  Patient completed minimal heparin/warfarin bridge 11/4.  Coumadin was d/c yesterday  d/t cardiac findings and need for cardiac cath. Heparin to start when INR <2 and cath when INR <1.7  This was just requested to start tonight (11/11) INR today is just above 2 (2.03) Goal of Therapy:  Heparin level 0.3-0.7 units/ml Monitor platelets by anticoagulation protocol: Yes   Plan:   Will begin Heparin at 1100 units/hour tonight. (no load due to INR ~2)  Lab to draw HL 6 hours after starting Heparin ~0030 11/12  [facility] Loletta Specter 07/04/2012,6:03 PM

## 2012-07-05 ENCOUNTER — Other Ambulatory Visit: Payer: Self-pay

## 2012-07-05 ENCOUNTER — Encounter (HOSPITAL_COMMUNITY)
Admission: EM | Disposition: A | Discharge status: Home / Self Care | Payer: Self-pay | Source: Home / Self Care | Attending: Internal Medicine

## 2012-07-05 DIAGNOSIS — I5021 Acute systolic (congestive) heart failure: Secondary | ICD-10-CM

## 2012-07-05 HISTORY — PX: LEFT HEART CATHETERIZATION WITH CORONARY ANGIOGRAM: SHX5451

## 2012-07-05 LAB — BASIC METABOLIC PANEL
BUN: 31 mg/dL — ABNORMAL HIGH (ref 6–23)
CO2: 31 mEq/L (ref 19–32)
Calcium: 10.7 mg/dL — ABNORMAL HIGH (ref 8.4–10.5)
Chloride: 96 mEq/L (ref 96–112)
Creatinine, Ser: 1.16 mg/dL — ABNORMAL HIGH (ref 0.50–1.10)
GFR calc Af Amer: 56 mL/min — ABNORMAL LOW (ref >90)
GFR calc non Af Amer: 48 mL/min — ABNORMAL LOW (ref >90)
Glucose, Bld: 182 mg/dL — ABNORMAL HIGH (ref 70–99)
Potassium: 4.1 mEq/L (ref 3.5–5.1)
Sodium: 135 mEq/L (ref 135–145)

## 2012-07-05 LAB — POCT I-STAT 3, VENOUS BLOOD GAS (G3P V)
Acid-Base Excess: 6 mmol/L — ABNORMAL HIGH (ref 0.0–2.0)
Acid-Base Excess: 5 mmol/L — ABNORMAL HIGH (ref 0.0–2.0)
Acid-Base Excess: 5 mmol/L — ABNORMAL HIGH (ref 0.0–2.0)
Bicarbonate: 30.4 mEq/L — ABNORMAL HIGH (ref 20.0–24.0)
Bicarbonate: 31.8 mEq/L — ABNORMAL HIGH (ref 20.0–24.0)
Bicarbonate: 31.7 mEq/L — ABNORMAL HIGH (ref 20.0–24.0)
Bicarbonate: 32.1 mEq/L — ABNORMAL HIGH (ref 20.0–24.0)
O2 Saturation: 77 %
O2 Saturation: 52 %
TCO2: 32 mmol/L (ref 0–100)
TCO2: 34 mmol/L (ref 0–100)
pCO2, Ven: 59 mmHg — ABNORMAL HIGH (ref 45.0–50.0)
pCO2, Ven: 55.7 mmHg — ABNORMAL HIGH (ref 45.0–50.0)
pCO2, Ven: 56 mmHg — ABNORMAL HIGH (ref 45.0–50.0)
pCO2, Ven: 56 mmHg — ABNORMAL HIGH (ref 45.0–50.0)
pH, Ven: 7.32 — ABNORMAL HIGH (ref 7.250–7.300)
pO2, Ven: 42 mmHg (ref 30.0–45.0)
pO2, Ven: 30 mmHg (ref 30.0–45.0)
pO2, Ven: 33 mmHg (ref 30.0–45.0)

## 2012-07-05 LAB — PRO B NATRIURETIC PEPTIDE: Pro B Natriuretic peptide (BNP): 6853 pg/mL — ABNORMAL HIGH (ref 0–125)

## 2012-07-05 LAB — POCT I-STAT 3, ART BLOOD GAS (G3+)
Acid-Base Excess: 5 mmol/L — ABNORMAL HIGH (ref 0.0–2.0)
O2 Saturation: 92 %

## 2012-07-05 SURGERY — LEFT HEART CATHETERIZATION WITH CORONARY ANGIOGRAM
Anesthesia: LOCAL

## 2012-07-05 MED ORDER — DIAZEPAM 5 MG PO TABS: 5.0000 mg | ORAL_TABLET | ORAL | Status: AC

## 2012-07-05 MED ORDER — SODIUM CHLORIDE 0.9 % IV SOLN
250.0000 mL | INTRAVENOUS | Status: DC
  Administered 2012-07-05: 250 mL via INTRAVENOUS

## 2012-07-05 MED ORDER — FENTANYL CITRATE 0.05 MG/ML IJ SOLN
INTRAMUSCULAR | Status: AC
  Filled 2012-07-05: qty 2

## 2012-07-05 MED ORDER — WARFARIN - PHARMACIST DOSING INPATIENT: Freq: Every day | Status: DC

## 2012-07-05 MED ORDER — DIAZEPAM 2 MG PO TABS
2.0000 mg | ORAL_TABLET | ORAL | Status: AC
  Administered 2012-07-05: 2 mg via ORAL
  Filled 2012-07-05: qty 1

## 2012-07-05 MED ORDER — NITROGLYCERIN 0.2 MG/ML ON CALL CATH LAB
INTRAVENOUS | Status: AC
  Filled 2012-07-05: qty 1

## 2012-07-05 MED ORDER — WARFARIN SODIUM 4 MG PO TABS
4.0000 mg | ORAL_TABLET | Freq: Once | ORAL | Status: AC
  Administered 2012-07-05: 4 mg via ORAL
  Filled 2012-07-05: qty 1

## 2012-07-05 MED ORDER — HEPARIN (PORCINE) IN NACL 2-0.9 UNIT/ML-% IJ SOLN
INTRAMUSCULAR | Status: AC
  Filled 2012-07-05: qty 2000

## 2012-07-05 MED ORDER — HEPARIN (PORCINE) IN NACL 100-0.45 UNIT/ML-% IJ SOLN
1350.0000 [IU]/h | INTRAMUSCULAR | Status: DC
  Administered 2012-07-06: 1400 [IU]/h via INTRAVENOUS
  Administered 2012-07-06 (×2): 1450 [IU]/h via INTRAVENOUS
  Administered 2012-07-07 (×2): 1350 [IU]/h via INTRAVENOUS
  Filled 2012-07-05 (×6): qty 250

## 2012-07-05 MED ORDER — LIDOCAINE HCL (PF) 1 % IJ SOLN
INTRAMUSCULAR | Status: AC
  Filled 2012-07-05: qty 30

## 2012-07-05 MED ORDER — SODIUM CHLORIDE 0.9 % IV SOLN
1.0000 mL/kg/h | INTRAVENOUS | Status: AC
  Administered 2012-07-05: 1 mL/kg/h via INTRAVENOUS

## 2012-07-05 MED ORDER — ASPIRIN EC 81 MG PO TBEC
81.0000 mg | DELAYED_RELEASE_TABLET | Freq: Every day | ORAL | Status: DC
  Administered 2012-07-06 – 2012-07-09 (×4): 81 mg via ORAL
  Filled 2012-07-05 (×5): qty 1

## 2012-07-05 MED ORDER — SODIUM CHLORIDE 0.9 % IJ SOLN
3.0000 mL | Freq: Two times a day (BID) | INTRAMUSCULAR | Status: DC
  Administered 2012-07-06 – 2012-07-07 (×3): 3 mL via INTRAVENOUS

## 2012-07-05 MED ORDER — SODIUM CHLORIDE 0.9 % IJ SOLN: 3.0000 mL | INTRAMUSCULAR | Status: DC | PRN

## 2012-07-05 MED ORDER — SODIUM CHLORIDE 0.9 % IV SOLN
250.0000 mL | INTRAVENOUS | Status: DC
  Administered 2012-07-06 – 2012-07-07 (×2): 250 mL via INTRAVENOUS

## 2012-07-05 MED ORDER — ONDANSETRON HCL 4 MG/2ML IJ SOLN: 4.0000 mg | Freq: Four times a day (QID) | INTRAMUSCULAR | Status: DC | PRN

## 2012-07-05 MED ORDER — ASPIRIN 81 MG PO CHEW
324.0000 mg | CHEWABLE_TABLET | ORAL | Status: AC
  Administered 2012-07-05: 324 mg via ORAL
  Filled 2012-07-05: qty 4

## 2012-07-05 MED ORDER — SODIUM CHLORIDE 0.9 % IJ SOLN: 3.0000 mL | Freq: Two times a day (BID) | INTRAMUSCULAR | Status: DC

## 2012-07-05 MED ORDER — HEPARIN (PORCINE) IN NACL 100-0.45 UNIT/ML-% IJ SOLN
1400.0000 [IU]/h | INTRAMUSCULAR | Status: DC
Start: 2012-07-05 — End: 2012-07-05
  Filled 2012-07-05 (×2): qty 250

## 2012-07-05 MED ORDER — MIDAZOLAM HCL 2 MG/2ML IJ SOLN
INTRAMUSCULAR | Status: AC
  Filled 2012-07-05: qty 2

## 2012-07-05 MED ORDER — ACETAMINOPHEN 325 MG PO TABS: 650.0000 mg | ORAL_TABLET | ORAL | Status: DC | PRN

## 2012-07-05 NOTE — Interval H&P Note (Signed)
History and Physical Interval Note:  07/05/2012 12:23 PM  [name] [name]  has presented today for surgery, with the diagnosis of New Diagnosis of Sever (presumed Ischemic Cardiomyopathy) with Abnormal Myoview ST.   Her INR is now 1.57.  She was transferred from [facility] for in-patient procedure.   The various methods of treatment have been discussed with the patient and family.   After consideration of risks, benefits and other options for treatment, the patient has consented to  Procedure(s) (LRB) with comments: LEFT HEART CATHETERIZATION WITH CORONARY ANGIOGRAM (N/A)  RIGHT HEART CATHETERIZATION.  as a surgical intervention .  The patient's history has been reviewed, patient examined, no change in status -- no active CHF symptoms -- but mid bilateral rales on exam & possoble soft S4 gallop.  She is otherwise stable for Cardiac Catheterization.  I have reviewed the patient's chart and labs.  Questions were answered to the patient's satisfaction.     Marykay Lex, M.D., M.S. [facility] [address] [place], [place]  [address]  [phone] Pager # [phone] 07/05/2012 12:24 PM

## 2012-07-05 NOTE — Brief Op Note (Signed)
06/21/2012 - 07/05/2012  4:04 PM   PATIENT:  [name] [name]  66 y.o. female with recent diagnosis of severe dilated cardiomyopathy and abnormal Myoview demonstrating large anterior apical scar. EF is estimated the roughly 20.0% on echocardiogram. She is referred for invasive cardiac evaluation for likely coronary disease. She has been diuresed aggressively and therefore right heart catheterizations also be performed to determine adequacy of diuresis.  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 There are bridging collaterals that lead to reconstitution of the vessel that gives some flow to a posterior descending artery and posterior lateral system. There is also left to right collaterals from septal perforators the apical LAD and circumflex. There is a relatively large RV marginal branch is diffusely diseased  Left main is moderate sized That Bifurcates into the LAD and Circumflex  The LAD begins a moderate caliber vessel with a ostial 30% stenosis followed by proximal and a median 90% stenosis at the first diagonal branch is very small and 89% ostial stenosis of. The LAD then has a short segment of relatively normal vessel followed by 2 tandem 70% lesions at the the takeoff of another small diagonal branch that is diffusely diseased. Then again the vessel normalizes somewhat before yet another diagonal branch also diffusely diseased at this point the vessel tapers into an near 99% hinge point before the vessel then continues on is a small less than 1.5 mm vessel that  wraps the apex. There extensive septal perforators including a large septal trunk just prior to the tandem 70% lesions. These provide collaterals to the Right Posterior Ascending Artery.  The circumflex is a moderate caliber vessel with mild proximal disease followed by tandem 70-80 0% stenosis in the mid AV groove after giving rise to small AV groove branch. The follow on main circumflex terminates as a significant bifurcating lateral Obtuse Marginal Branch . The proximal portions lateral OM vessel is diffusely diseased 70% proximal followed by a 90% stenosis just prior to the major bifurcation into OM 1 OM 2 which are both small caliber less than 1.5 mm vessels coursing along the inferolateral wall.  Left ventriculography demonstrated severe global hypokinesis most notable in the distal anterior and inferior walls. EF roughly 20-25%; LVEDP was 21-23 mm Hg .  Cardiac output by thermal dilution is 3.8 L per minute  Left Ventricular Pressures: 106/70 mm artery, 21 mm Hg LVEDP; Central Aortic Pressures : 106/58 mmHg; 77 mm creatinine  Right Atrial Pressures: 16/13 mmHg, mean 11 mm Hg; Right Ventricular Pressures: 53/11 mmHg, 13 mmHg mean; PA pressure: 46/22 mmHg, 32 mmHg mean; PCWP: 24/23 mmHg; 21 mm Hg mean  ANESTHESIA:   local and IV sedation; 2 Mg IV Versed, and 20 Mcg IV Fentanyl  EBL: < 20 mlMEDICATIONS USED:  LIDOCAINE 18 mL in the right groin, Omnipaque contrast 70 mL, Intracoronary Nitroglycerin  200 mcg and 2  PROCEDURE:  Fluoroscopically guided arterial access using modified Seldinger technique placing a 5 French sheath in RFA, followed by 7 French sheath in RNV using modified Seldinger technique.  RHC: 7 French Swan-Ganz catheter advanced stepwise in the Right Atrium - Right Ventricle -  Pulmonary Artery - Pulmonary Capillary Wedge Position; O2% saturations and hemodynamics obtain these position as well as simultaneously with arterial and left ventricular pressures and SAO2%.     LHC/Coronary Angiography: 5 French angled pigtail catheter advanced over standard J-wire into the left ventricle for hemodynamics and left ventriculography in the RAO projection, exchanged for a JL4 follow JR 4 catheters were Left and Right Coronary Angiography. Intracoronary Nitroglycerin was administered in both Left and Right Coronary Artery  After completion of angiography, the catheters were removed completely out of body over the wire.   PATIENT DISPOSITION:  PACU - hemodynamically stable. SHEATH:  5Fr RFA, 7Fr RFV - removed in PACU with manual pressure for hemostasis. DICTATION: .Note written in paper chart and Note written in EPIC  PLAN OF CARE: Admit to inpatient  - @ [facility]  Cardiac MRI tomorrow to evaluate for Viability - if +, CT Surgical consultation  [facility] consult  Continue IV Lasix for least another day given the LAO BP elevated this may be her baseline.  Restart IV Heparin tonight along with warfarin  Continue to titrate cardiac medications as well as diabetes medications.  Will take the patient over to [facility] service    Delay start of Pharmacological VTE agent (>24hrs) due to surgical blood loss or risk of bleeding: not applicable -- Restart Heparin 8 hr sheath removal.  Marykay Lex, M.D., M.S. [facility] [address] [place], [place]  [address]  [phone] Pager # [phone] 07/05/2012 4:36 PM

## 2012-07-05 NOTE — Progress Notes (Signed)
ANTICOAGULATION CONSULT NOTE - Follow Up  Pharmacy Consult for Heparin Indication: New B/L DVT   Allergies  Allergen Reactions  . Penicillins Anaphylaxis  . Sulfa Antibiotics Anaphylaxis    Patient Measurements: Height: 5\' 6"  (167.6 cm) Weight: 163 lb 5.8 oz (74.1 kg) (standup scale) IBW/kg (Calculated) : 59.3  Heparin Dosing [id number]  Vital Signs: Temp: 98.4 F (36.9 C) (11/12 0523) Temp src: Oral (11/12 0523) BP: 104/54 mmHg (11/12 0834) Pulse Rate: 80  (11/12 0834)  Labs:  Basename 07/05/12 0830 07/05/12 0421 07/05/12 0040 07/04/12 0443 07/03/12 0513  HGB -- -- -- -- --  HCT -- -- -- -- --  PLT -- -- -- -- --  APTT -- -- -- -- --  LABPROT -- 18.3* -- 22.1* 29.0*  INR -- 1.57* -- 2.03* 2.92*  HEPARINUNFRC 0.57 -- 0.10* -- --  CREATININE -- 1.16* -- 1.18* 1.19*  CKTOTAL -- -- -- -- --  CKMB -- -- -- -- --  TROPONINI -- -- -- -- --    Estimated Creatinine Clearance: 49.8 ml/min (by C-G formula based on Cr of 1.16).   Medical History: Past Medical History  Diagnosis Date  . Cardiomyopathy, EF 25% 06/29/2012    Medications:  Scheduled:     . aspirin  324 mg Oral Pre-Cath  . aspirin EC  81 mg Oral Daily  . atorvastatin  10 mg Oral q1800  . carvedilol  3.125 mg Oral BID WC  . diazepam  2 mg Oral On Call  . docusate sodium  100 mg Oral BID  . feeding supplement  237 mL Oral BID BM  . furosemide  40 mg Intravenous BID  . insulin aspart  0-9 Units Subcutaneous TID WC  . insulin glargine  18 Units Subcutaneous QHS  . living well with diabetes book   Does not apply Once  . polyethylene glycol  17 g Oral Daily  . sodium chloride  3 mL Intravenous Q12H  . sodium chloride  3 mL Intravenous Q12H  . spironolactone  12.5 mg Oral BID  . [DISCONTINUED] aspirin EC  81 mg Oral Daily  . [DISCONTINUED] insulin glargine  15 Units Subcutaneous QHS   Infusions:     . sodium chloride    . heparin 1,400 Units/hr (07/05/12 0146)  . [DISCONTINUED] heparin 1,100  Units/hr (07/04/12 1847)   PRN: bisacodyl, LORazepam, morphine injection, ondansetron (ZOFRAN) IV, ondansetron, oxyCODONE-acetaminophen, sodium chloride  Assessment: 66 yo F presented 10/29 with BLE swelling x 2 weeks, mild DOE. 10/30, preliminary result with + DVT in posterior tibial vein. CT Angio negative for PE. Heparin and warfarin started.  Patient completed heparin/warfarin bridge (10/30-11/4). Coumadin was d/c 11/10 d/t cardiac findings and need for cardiac cath. Heparin to start when INR <2 and cath when INR <1.5. Heparin infusion started 11/11 PM INR today 1.57 (<2.03, 11/11) Heparin infusion started at 1100 units/hour 11/11 PM, no bolus. Initial HL subtx (<0.1). Rate increased to 1400 units/hour per protocol at 0200 today. HL now therapeutic (0.57) on increased rate No bleeding reported and no issues w/IV line per RN Last CBC 11/6  Goal of Therapy:  Heparin level 0.3-0.7 units/ml Monitor platelets by anticoagulation protocol: Yes   Plan:   Continue Heparin drip @ 1400 units/hr.  Recheck HL in 6 hrs  CBC in 6 hours also and Daily  Daily Heparin Level  Gwen Her PharmD  [phone] 07/05/2012 9:46 AM

## 2012-07-05 NOTE — Progress Notes (Signed)
Received call from Dr. Herbie Baltimore that patient will be staying at [facility] given her cardiac findings. They will assume primary care.  Peggye Pitt, MD [facility] Pager: [phone]

## 2012-07-05 NOTE — Progress Notes (Signed)
ANTICOAGULATION CONSULT NOTE - F/U Consult  Pharmacy Consult for Heparin Indication: New B/L DVT   Allergies  Allergen Reactions  . Penicillins Anaphylaxis  . Sulfa Antibiotics Anaphylaxis    Patient Measurements: Height: 5\' 6"  (167.6 cm) Weight: 156 lb 12 oz (71.1 kg) (STANDING SCALE) IBW/kg (Calculated) : 59.3  Heparin Dosing [id number]  Vital Signs: Temp: 99.1 F (37.3 C) (11/11 2118) Temp src: Oral (11/11 2118) BP: 100/46 mmHg (11/11 2118) Pulse Rate: 82  (11/11 2118)  Labs:  Basename 07/05/12 0040 07/04/12 0443 07/03/12 0513 07/02/12 0544  HGB -- -- -- --  HCT -- -- -- --  PLT -- -- -- --  APTT -- -- -- --  LABPROT -- 22.1* 29.0* 30.0*  INR -- 2.03* 2.92* 3.06*  HEPARINUNFRC 0.10* -- -- --  CREATININE -- 1.18* 1.19* --  CKTOTAL -- -- -- --  CKMB -- -- -- --  TROPONINI -- -- -- --    Estimated Creatinine Clearance: 44.5 ml/min (by C-G formula based on Cr of 1.18).   Medical History: Past Medical History  Diagnosis Date  . Cardiomyopathy, EF 25% 06/29/2012    Medications:  Scheduled:     . aspirin EC  81 mg Oral Daily  . atorvastatin  10 mg Oral q1800  . carvedilol  3.125 mg Oral BID WC  . docusate sodium  100 mg Oral BID  . feeding supplement  237 mL Oral BID BM  . furosemide  40 mg Intravenous BID  . insulin aspart  0-9 Units Subcutaneous TID WC  . insulin glargine  18 Units Subcutaneous QHS  . living well with diabetes book   Does not apply Once  . polyethylene glycol  17 g Oral Daily  . sodium chloride  3 mL Intravenous Q12H  . spironolactone  12.5 mg Oral BID  . [DISCONTINUED] insulin glargine  15 Units Subcutaneous QHS   Infusions:     . heparin 1,100 Units/hr (07/04/12 1847)   PRN: bisacodyl, LORazepam, morphine injection, ondansetron (ZOFRAN) IV, ondansetron, oxyCODONE-acetaminophen  Assessment: 66 yo F presented 10/29 with BLE swelling x 2 weeks, mild DOE. 10/30, preliminary result with + DVT in posterior tibial vein, heparin  and warfarin started.  Patient completed minimal heparin/warfarin bridge 11/4.  Coumadin was d/c yesterday d/t cardiac findings and need for cardiac cath. Heparin to start when INR <2 and cath when INR <1.7  This was just requested to start last tonight (11/11) INR today is just above 2 (2.03) HL=<0.10 units/hr after 1100 units/hr @ [facility]. No problems with IV or bleeding per RN. Goal of Therapy:  Heparin level 0.3-0.7 units/ml Monitor platelets by anticoagulation protocol: Yes   Plan:   Will increase Heparin drip to 1400 units/hr.  Recheck HL in 6 hrs  [facility] previously ordered Susanne Greenhouse R 07/05/2012,1:31 AM

## 2012-07-05 NOTE — Progress Notes (Signed)
ANTICOAGULATION CONSULT NOTE - Initial Consult  Pharmacy Consult for heparin and coumadin Indication: hx of DVT s/p cath  Allergies  Allergen Reactions  . Penicillins Anaphylaxis  . Sulfa Antibiotics Anaphylaxis    Patient Measurements: Height: 5\' 6"  (167.6 cm) Weight: 163 lb 5.8 oz (74.1 kg) (standup scale) IBW/kg (Calculated) : 59.3  Heparin Dosing Weight: 71.1 kg  Vital Signs: Temp: 98.4 F (36.9 C) (11/12 0523) Temp src: Oral (11/12 0523) BP: 104/54 mmHg (11/12 0834) Pulse Rate: 99  (11/12 1350)  Labs:  Basename 07/05/12 0830 07/05/12 0421 07/05/12 0040 07/04/12 0443 07/03/12 0513  HGB -- -- -- -- --  HCT -- -- -- -- --  PLT -- -- -- -- --  APTT -- -- -- -- --  LABPROT -- 18.3* -- 22.1* 29.0*  INR -- 1.57* -- 2.03* 2.92*  HEPARINUNFRC 0.57 -- 0.10* -- --  CREATININE -- 1.16* -- 1.18* 1.19*  CKTOTAL -- -- -- -- --  CKMB -- -- -- -- --  TROPONINI -- -- -- -- --    Estimated Creatinine Clearance: 49.8 ml/min (by C-G formula based on Cr of 1.16).   Medical History: Past Medical History  Diagnosis Date  . Cardiomyopathy, EF 25% 06/29/2012    Medications:  Scheduled:    . [COMPLETED] aspirin  324 mg Oral Pre-Cath  . aspirin EC  81 mg Oral Daily  . atorvastatin  10 mg Oral q1800  . carvedilol  3.125 mg Oral BID WC  . [COMPLETED] diazepam  2 mg Oral On Call  . diazepam  5 mg Oral Pre-Cath  . docusate sodium  100 mg Oral BID  . feeding supplement  237 mL Oral BID BM  . [COMPLETED] fentaNYL      . furosemide  40 mg Intravenous BID  . [COMPLETED] heparin      . insulin aspart  0-9 Units Subcutaneous TID WC  . insulin glargine  18 Units Subcutaneous QHS  . [COMPLETED] lidocaine      . living well with diabetes book   Does not apply Once  . [COMPLETED] midazolam      . [COMPLETED] nitroGLYCERIN      . polyethylene glycol  17 g Oral Daily  . sodium chloride  3 mL Intravenous Q12H  . sodium chloride  3 mL Intravenous Q12H  . spironolactone  12.5 mg Oral BID   . [DISCONTINUED] aspirin EC  81 mg Oral Daily  . [DISCONTINUED] sodium chloride  3 mL Intravenous Q12H   Infusions:    . sodium chloride    . sodium chloride    . [DISCONTINUED] sodium chloride 250 mL (07/05/12 0930)  . [DISCONTINUED] heparin 1,100 Units/hr (07/04/12 1847)  . [DISCONTINUED] heparin 1,400 Units/hr (07/05/12 0146)    Assessment: 66 yo female with hx of DVT s/p cath will be resumed on heparin 8 hours post sheath removal and restarting coumadin tonight.  INR today was 1.57.  Coumadin was started at [facility] and was held for cath (doses have been variable) .  Patient was therapeutic at 1400 units/hr of heparin earlier today.  (heparin level was 0.57).  Sheath was removed at ~1705 per RN. Goal of Therapy:  INR 2-3; 6hr heparin level 0.3-0.7 Monitor platelets by anticoagulation protocol: Yes   Plan:  1) Restart heparin at 1400 units/hr at 0115 on 07/06/12. No bolus. 2) Coumadin 4mg  po x1 tonight 3) 6hr heparin level after drip is started.  Daily heparin level and CBC 4) Daily PT/INR  Telesha Deguzman, Tsz-Yin  07/05/2012,5:07 PM

## 2012-07-05 NOTE — H&P (View-Only) (Signed)
Subjective:  No chest pain or SOB. Lower extremity edema improving  Objective:  Vital Signs in the last 24 hours: Temp:  [98.4 F (36.9 C)-99 F (37.2 C)] 98.4 F (36.9 C) (11/11 1300) Pulse Rate:  [80-87] 87  (11/11 1300) Resp:  [16-18] 17  (11/11 1300) BP: (108-109)/(55-67) 109/67 mmHg (11/11 1300) SpO2:  [96 %-97 %] 97 % (11/11 1300) Weight:  [71.1 kg (156 lb 12 oz)] 71.1 kg (156 lb 12 oz) (11/11 0635)  Intake/Output from previous day:  Intake/Output Summary (Last 24 hours) at 07/04/12 1733 Last data filed at 07/04/12 1500  Gross per 24 hour  Intake    972 ml  Output   1450 ml  Net   -478 ml    Physical Exam: General appearance: alert, cooperative and no distress Lungs: basilar rhonchi Heart: regular rate and rhythm trace bilat edema   Rate: 86  Rhythm: normal sinus rhythm  Lab Results: No results found for this basename: WBC:2,HGB:2,PLT:2 in the last 72 hours  Basename 07/04/12 0443 07/03/12 0513  NA 134* 136  K 4.3 4.2  CL 96 96  CO2 32 32  GLUCOSE 210* 269*  BUN 29* 24*  CREATININE 1.18* 1.19*   No results found for this basename: TROPONINI:2,CK,MB:2 in the last 72 hours Hepatic Function Panel No results found for this basename: PROT,ALBUMIN,AST,ALT,ALKPHOS,BILITOT,BILIDIR,IBILI in the last 72 hours No results found for this basename: CHOL in the last 72 hours  Basename 07/04/12 0443  INR 2.03*    Imaging: Imaging results have been reviewed  Cardiac Studies:  Assessment/Plan:   Principal Problem:  *Acute systolic CHF (congestive heart failure) Active Problems:  Cardiomyopathy, EF 25% by echo, 13% by Myoview  Pedal edema  Cellulitis  DVT Oct 2013  CKD (chronic kidney disease) stage 3, GFR 30-59 ml/min  Popliteal artery thrombosis, right  LBBB (left bundle branch block)  DM (diabetes mellitus)   Plan- INR 2.03 at 4am today. Will start Heparin as she has had recent thrombotic, and (?) embolic events. Plan for cath when INR less than 1.6 at  least, probably not tomorrow but will give her a full liquid breakfast then NPO till INR is back.   Mari Battaglia PA-C 07/04/2012, 5:33 PM    

## 2012-07-05 NOTE — Progress Notes (Signed)
Nutrition Follow-up  Intervention:   1.   General healthful diet; continue current management.  Intake has improved and pt supplementing intake with Ensure.  Assessment:   Pt at [facility] for cardiac cath, not available at time of visit. Per chart review, pt consuming 50-100% of CHO Mod meals.  She has also been supplementing with 1-2 Ensures daily.  Note pt may be d/c'd home on insulin.  Will follow for education needs.  Diet Order:  NPO for procedure  Meds: Scheduled Meds:   . [COMPLETED] aspirin  324 mg Oral Pre-Cath  . aspirin EC  81 mg Oral Daily  . atorvastatin  10 mg Oral q1800  . carvedilol  3.125 mg Oral BID WC  . [COMPLETED] diazepam  2 mg Oral On Call  . diazepam  5 mg Oral Pre-Cath  . docusate sodium  100 mg Oral BID  . feeding supplement  237 mL Oral BID BM  . furosemide  40 mg Intravenous BID  . insulin aspart  0-9 Units Subcutaneous TID WC  . insulin glargine  18 Units Subcutaneous QHS  . living well with diabetes book   Does not apply Once  . polyethylene glycol  17 g Oral Daily  . sodium chloride  3 mL Intravenous Q12H  . sodium chloride  3 mL Intravenous Q12H  . spironolactone  12.5 mg Oral BID  . [DISCONTINUED] aspirin EC  81 mg Oral Daily   Continuous Infusions:   . sodium chloride 250 mL (07/05/12 0930)  . heparin 1,400 Units/hr (07/05/12 0146)  . [DISCONTINUED] heparin 1,100 Units/hr (07/04/12 1847)   PRN Meds:.bisacodyl, LORazepam, morphine injection, ondansetron (ZOFRAN) IV, ondansetron, oxyCODONE-acetaminophen, sodium chloride   CMP     Component Value Date/Time   NA 135 07/05/2012 0421   K 4.1 07/05/2012 0421   CL 96 07/05/2012 0421   CO2 31 07/05/2012 0421   GLUCOSE 182* 07/05/2012 0421   BUN 31* 07/05/2012 0421   CREATININE 1.16* 07/05/2012 0421   CALCIUM 10.7* 07/05/2012 0421   CALCIUM 11.1* 07/03/2012 0513   PROT 7.8 04/04/2008 1020   ALBUMIN 4.0 04/04/2008 1020   AST 17 04/04/2008 1020   ALT 24 04/04/2008 1020   ALKPHOS 123* 04/04/2008 1020    BILITOT 0.7 04/04/2008 1020   GFRNONAA 48* 07/05/2012 0421   GFRAA 56* 07/05/2012 0421    CBG (last 3)   Basename 07/05/12 0736 07/04/12 2243 07/04/12 1721  GLUCAP 171* 228* 290*     Intake/Output Summary (Last 24 hours) at 07/05/12 1335 Last data filed at 07/05/12 0958  Gross per 24 hour  Intake 2694.42 ml  Output   1150 ml  Net 1544.42 ml    Weight Status:  163 lbs, highly variable  Re-estimated needs:  1600-1800 kcal, 75-85g protein  Nutrition Dx:  Inadequate oral intake, ongoing  Monitor:   1.  Food/Beverage; pt consuming >75% of meals and supplements.  Met, ongoing.   Loyce Dys, MS RD LDN Clinical Inpatient Dietitian Pager: [phone] Weekend/After hours pager: [phone]

## 2012-07-06 ENCOUNTER — Inpatient Hospital Stay (HOSPITAL_COMMUNITY): Payer: Medicare Other

## 2012-07-06 ENCOUNTER — Encounter (HOSPITAL_COMMUNITY): Payer: Self-pay | Admitting: General Practice

## 2012-07-06 ENCOUNTER — Other Ambulatory Visit: Payer: Self-pay | Admitting: *Deleted

## 2012-07-06 DIAGNOSIS — I2510 Atherosclerotic heart disease of native coronary artery without angina pectoris: Secondary | ICD-10-CM

## 2012-07-06 LAB — GLUCOSE, CAPILLARY
Glucose-Capillary: 172 mg/dL — ABNORMAL HIGH (ref 70–99)
Glucose-Capillary: 166 mg/dL — ABNORMAL HIGH (ref 70–99)
Glucose-Capillary: 226 mg/dL — ABNORMAL HIGH (ref 70–99)
Glucose-Capillary: 188 mg/dL — ABNORMAL HIGH (ref 70–99)

## 2012-07-06 LAB — PROTIME-INR
INR: 1.48 (ref 0.00–1.49)
Prothrombin Time: 17.5 seconds — ABNORMAL HIGH (ref 11.6–15.2)

## 2012-07-06 LAB — CBC
Platelets: 283 10*3/uL (ref 150–400)
RDW: 15.5 % (ref 11.5–15.5)
WBC: 9.7 10*3/uL (ref 4.0–10.5)

## 2012-07-06 MED ORDER — ZOLPIDEM TARTRATE 5 MG PO TABS: 5.0000 mg | ORAL_TABLET | Freq: Every evening | ORAL | Status: DC | PRN

## 2012-07-06 MED ORDER — WARFARIN SODIUM 5 MG PO TABS
5.0000 mg | ORAL_TABLET | Freq: Once | ORAL | Status: AC
  Administered 2012-07-06: 5 mg via ORAL
  Filled 2012-07-06: qty 1

## 2012-07-06 MED ORDER — GADOBENATE DIMEGLUMINE 529 MG/ML IV SOLN: 25.0000 mL | Freq: Once | INTRAVENOUS | Status: AC | PRN

## 2012-07-06 NOTE — Consult Note (Signed)
[address]            [place] [address]          [phone]       [name] [name] [facility] Medical Record #[id number] Date of Birth: 1945/11/06  Referring: No ref. provider found Primary Care: No primary provider on file.  Chief Complaint:    Chief Complaint  Patient presents with  . Shortness of Breath  . Leg Swelling    History of Present Illness:     66 year old untreated diabetic with severe peripheral vascular disease presented with heart failure, pulmonary edema, pleural effusions, pedal edema. 2-D echocardiogram demonstrated cardiomyopathy with EF of 20-25% without significant valvular disease. She was in sinus rhythm. Her BNP was significantly elevated and she received diuretic therapy. She is found to have bilateral DVT, occluded right popliteal artery, right foot cellulitis, Escherichia coli UTI and hemoglobin A1c greater than 9.1. Recent cardiac catheterization demonstrated severe diffuse diabetic pattern CAD with poor targets for grafting, probable nongraftable circumflex possible graftable distal RCA and probable graftable LAD. LVEDP was 22 mm mercury and PA pressures 50 over 25 with a wedge of 22. LV diameter 6 cm in diastole. MRI viability study demonstrates EF 22%, greater than 50% transmural scarring of the lateral wall. Opinion regarding CABG requested. Patient currently on IV heparin with Coumadin load in progress. She has lost 20 pounds of fluid weight since her hospitalization. She's been treated with antibiotics for her UTI.   Current Activity/ Functional Status: Patient lives alone states she is able to walk on her ischemic right leg and take care of herself   Past Medical History  Diagnosis Date  . Cardiomyopathy, EF 25% 06/29/2012  . Type II diabetes mellitus     Past Surgical History  Procedure Date  . Vaginal hysterectomy ~ 1980  . Cesarean section 1979  . Lumbar disc surgery 1980's    History  Smoking  status  . Former Smoker -- 0.5 packs/day for 20 years  . Types: Cigarettes  Smokeless tobacco  . Never Used    Comment: 07/06/12 "stopped smoking cigarettes years ago"    History  Alcohol Use No    History   Social History  . Marital Status: Widowed    Spouse Name: N/A    Number of Children: N/A  . Years of Education: N/A   Occupational History  . Not on file.   Social History Main Topics  . Smoking status: Former Smoker -- 0.5 packs/day for 20 years    Types: Cigarettes  . Smokeless tobacco: Never Used     Comment: 07/06/12 "stopped smoking cigarettes years ago"  . Alcohol Use: No  . Drug Use: No  . Sexually Active: Yes   Other Topics Concern  . Not on file   Social History Narrative  . No narrative on file    Allergies  Allergen Reactions  . Penicillins Anaphylaxis  . Sulfa Antibiotics Anaphylaxis    Current Facility-Administered Medications  Medication Dose Route Frequency Provider Last Rate Last Dose  . [EXPIRED] 0.9 %  sodium chloride infusion  1 mL/kg/hr Intravenous Continuous Marykay Lex, MD 74.1 mL/hr at 07/05/12 1853 1 mL/kg/hr at 07/05/12 1853  . 0.9 %  sodium chloride infusion  250 mL Intravenous Continuous Marykay Lex, MD 1 mL/hr at 07/06/12 0155 250 mL at 07/06/12 0155  . acetaminophen (TYLENOL) tablet  650 mg  650 mg Oral Q4H PRN Marykay Lex, MD      . aspirin EC tablet 81 mg  81 mg Oral Daily Henderson Cloud, MD   81 mg at 07/06/12 1016  . atorvastatin (LIPITOR) tablet 10 mg  10 mg Oral q1800 Henderson Cloud, MD   10 mg at 07/06/12 1835  . bisacodyl (DULCOLAX) suppository 10 mg  10 mg Rectal Daily PRN Henderson Cloud, MD   10 mg at 06/30/12 2327  . carvedilol (COREG) tablet 3.125 mg  3.125 mg Oral BID WC Nada Boozer, NP   3.125 mg at 07/06/12 1830  . diazepam (VALIUM) tablet 5 mg  5 mg Oral Pre-Cath Marykay Lex, MD      . docusate sodium (COLACE) capsule 100 mg  100 mg Oral BID Houston Siren, MD   100 mg at  07/06/12 1018  . feeding supplement (ENSURE COMPLETE) liquid 237 mL  237 mL Oral BID BM Jeoffrey Massed, RD   237 mL at 07/06/12 1431  . furosemide (LASIX) injection 40 mg  40 mg Intravenous BID Dorothea Ogle, MD   40 mg at 07/06/12 1836  . gadobenate dimeglumine (MULTIHANCE) injection 25 mL  25 mL Intravenous Once PRN Medication Radiologist, MD      . heparin ADULT infusion 100 units/mL (25000 units/250 mL)  1,450 Units/hr Intravenous Continuous Brett Fairy, PHARMD 14.5 mL/hr at 07/06/12 1437 1,450 Units/hr at 07/06/12 1437  . insulin aspart (novoLOG) injection 0-9 Units  0-9 Units Subcutaneous TID WC Dorothea Ogle, MD   3 Units at 07/06/12 1828  . insulin glargine (LANTUS) injection 18 Units  18 Units Subcutaneous QHS Henderson Cloud, MD   18 Units at 07/05/12 2125  . living well with diabetes book MISC   Does not apply Once Provider Default, MD      . LORazepam (ATIVAN) tablet 0.25 mg  0.25 mg Oral Q12H PRN Henderson Cloud, MD   0.25 mg at 07/04/12 2306  . morphine 2 MG/ML injection 2 mg  2 mg Intravenous Q2H PRN Houston Siren, MD   2 mg at 07/06/12 1449  . ondansetron (ZOFRAN) tablet 4 mg  4 mg Oral Q6H PRN Houston Siren, MD   4 mg at 06/29/12 0128   Or  . ondansetron (ZOFRAN) injection 4 mg  4 mg Intravenous Q6H PRN Houston Siren, MD   4 mg at 06/29/12 1734  . ondansetron (ZOFRAN) injection 4 mg  4 mg Intravenous Q6H PRN Marykay Lex, MD      . oxyCODONE-acetaminophen (PERCOCET/ROXICET) 5-325 MG per tablet 1-2 tablet  1-2 tablet Oral Q4H PRN Dorothea Ogle, MD   1 tablet at 07/06/12 0043  . polyethylene glycol (MIRALAX / GLYCOLAX) packet 17 g  17 g Oral Daily Henderson Cloud, MD   17 g at 07/06/12 1018  . sodium chloride 0.9 % injection 3 mL  3 mL Intravenous Q12H Houston Siren, MD   3 mL at 07/06/12 1025  . sodium chloride 0.9 % injection 3 mL  3 mL Intravenous Q12H Marykay Lex, MD   3 mL at 07/06/12 1000  . sodium chloride 0.9 % injection 3 mL  3 mL Intravenous PRN Marykay Lex, MD      . spironolactone (ALDACTONE) tablet 12.5 mg  12.5 mg Oral BID Nada Boozer, NP   12.5 mg at 07/06/12 1835  . [COMPLETED] warfarin (COUMADIN) tablet 4 mg  4 mg Oral Once Henderson Cloud, MD   4 mg at 07/05/12 2125  . [COMPLETED] warfarin (COUMADIN) tablet 5 mg  5 mg Oral ONCE-1800 Brett Fairy, PHARMD   5 mg at 07/06/12 1834  . Warfarin - Pharmacist Dosing Inpatient   Does not apply q1800 Henderson Cloud, MD      . zolpidem [facility]) tablet 5 mg  5 mg Oral QHS PRN Abelino Derrick, PA        Prescriptions prior to admission  Medication Sig Dispense Refill  . ibuprofen (ADVIL,MOTRIN) 200 MG tablet Take 200 mg by mouth every 6 (six) hours as needed. Pain      . Multiple Vitamin (MULTIVITAMIN) LIQD Take 5 mLs by mouth daily.      Marland Kitchen triamcinolone ointment (KENALOG) 0.5 % Apply 1 application topically 2 (two) times daily.        Family History  Problem Relation Age of Onset  . Cancer Mother   . CAD Mother   . Cancer Father   . CAD Father      Review of Systems admission laboratory  Admission laboratory data shows hypercalcemia initially calcium greater than 12.0, PTH elevated 125 is with hyperparathyroidism  Urinary tract infection with Escherichia coli Right popliteal artery occlusion probable acute     Cardiac Review of Systems: Y or N  Chest Pain [  n  ]  Resting SOB [  y ] Exertional SOB  y[  ]  Orthopnea Cove.Etienne  ]   Pedal Edema [  y ]    Palpitations [  n] Syncope  [  ]   Presyncope [n   ]  General Review of Systems: [Y] = yes [  ]=no Constitional: recent weight change Cove.Etienne  ]; anorexia [  ]; fatigue [  ]; nausea [  ]; night sweats [  ]; fever [n  ]; or chills [  ];                                                                                                                                          Dental: poor dentition[  y]; Last Dentist visit:2 yrs  Eye : blurred vision [  ]; diplopia [   ]; vision changes [  ];  Amaurosis fugax[  ]; Resp: cough [   ];  wheezing[  ];  hemoptysis[  ]; shortness of breath[  ]; paroxysmal nocturnal dyspnea[  ]; dyspnea on exertion[  ]; or orthopnea[  ];  GI:  gallstones[  ], vomiting[  ];  dysphagia[  ]; melena[  ];  hematochezia [  ]; heartburn[  ];   Hx of  Colonoscopy[  ]; GU: kidney stones [  ]; hematuria[  ];   dysuria [  ];  nocturia[  ];  history of     obstruction [  ];  Skin: rash, swelling[  ];, hair loss[  ];  peripheral edema[  y];  or itching[  ]; Musculosketetal: myalgias[  ];  joint swelling[  ];  joint erythema[  ];  joint pain[  ];  back pain[  ];  Heme/Lymph: bruising[  ];  bleeding[  ];  anemia[  ];  Neuro: TIA[  ];  headaches[  ];  stroke[  ];  vertigo[  ];  seizures[  ];   paresthesias[  ];  difficulty walking[  ];  Psych:depression[  ]; anxiety[  ];  Endocrine: diabetesy[y  ];  thyroid dysfunction[  ];  Immunizations: Flu [  ]; Pneumococcal[  ];  Other:  Physical Exam: BP 108/66  Pulse 99  Temp 98.4 F (36.9 C) (Oral)  Resp 18  Ht 5\' 6"  (1.676 m)  Wt 163 lb 12.8 oz (74.299 kg)  BMI 26.44 kg/m2  SpO2 97%  General chronically ill Caucasian female no acute distress HEENT normocephalic upper dental plate Neck no carotid bruit JVD or mass Thorax no deformity distant breath sounds bilaterally Cardiac regular rhythm without murmur Abdomen soft nontender without organomegaly or pulsatile mass Extremities nonpalpable pedal pulses right foot erythematous and nontender, edema now minimal Neuro no focal motor deficit alert and oriented   Diagnostic Studies & Laboratory data:   Cardiac catheterization, 2-D echocardiogram, cardiac MRI, CTA of the chest, personally reviewed  Recent Radiology Findings:   Mr Card Morphology Wo/w Cm  07/06/2012  *RADIOLOGY REPORT*  Clinical Data: Multivessel coronary artery disease with a large infarct area noted on Myoview stress.  MR CARDIA MORPHOLOGY WITHOUT AND WITH CONTRAST  Contrast:  25 ml Multihance  Comparison: No priors.  Findings:   LEFT VENTRICULAR FUNCTION - End-diastolic volume: 175ml End-systolic volume: 604 ml Stroke volume: 38 ml Ejection fraction: 22.0% Cardiac output: 3347 ml/minute  WALL MOTION ASSESSMENT - Base:  Akinesis of the inferolateral wall segment (although this wall segment appears to move, this is likely from tethering rather than true contraction and there is no appreciable wall thickening on short axis images).  Otherwise, severely hypokinetic. Midventricle: Akinesis of the inferolateral wall segment (although this wall segment appears to move, this is likely from tethering rather than true contraction and there is no appreciable wall thickening on short axis images).  Otherwise, severely hypokinetic. Apex: Severe hypokinesis.  CARDIAC MEASUREMENTS - Left ventricular end-diastolic diameter: 59 mm Left ventricular end-systolic diameter: 52 mm Septal thickness: 9 mm Posterior wall thickness: 8mm Left atrial diameter: 42 mm  VALVES - Quantitative analysis with phase contrast imaging was not performed. Aortic valve: Unremarkable. Mitral valve: Unremarkable. Tricuspid valve: Unremarkable.  DELAYED ENHANCEMENT - Base: 0-25% anterolateral wall (portions involved are 25-50% transmural emanating from the subendocardium). 25-50% inferolateral wall (also 25-50% transmural emanating from the subendocardium). Otherwise, negative. Midventricle: 25-50% inferolateral wall (also 25-50% transmural emanating from the subendocardium). Otherwise, negative. Apex: Negative.  PERICARDIUM:  Small pericardial effusion.  During real time dynamic imaging with a deep inspiratory effort there was no significant straightening or bowing of the interventricular septum to suggest ventricular interdependence.  ADDITIONAL FINDINGS:  Moderate bilateral pleural effusions.  IMPRESSION: 1.  Left ventricular dilatation at both end-diastole and end- systole with severely reduced resting left ventricular systolic function.  Calculated resting left ventricular  ejection fraction of 22%. 2.  There are areas of delayed myocardial hyperenhancement in the basal and midventricular lateral wall segments, as detailed above, compatible with scarring from prior left circumflex/OM territory myocardial infarction.  These regions do appear to be more  severely hypokinetic or frankly akinetic, however, this is difficult to judge given the background of severe global hypokinesis.  Given the fact that these regions of delayed enhancement are less than 50% transmural, these myocardial segments may be viable. 3.  No other areas of delayed enhancement to suggest additional areas of scarring.  Accordingly, the remainder of the myocardium including the entire LAD and RCA territory appears to be viable. 4.  Small pericardial effusion.  This does not appear to be hemodynamically significant at this time. 5.  Moderate bilateral pleural effusions.   Original Report Authenticated By: Trudie Reed, M.D.       Recent Lab Findings: Lab Results  Component Value Date   WBC 9.7 07/06/2012   HGB 9.9* 07/06/2012   HCT 31.1* 07/06/2012   PLT 283 07/06/2012   GLUCOSE 182* 07/05/2012   ALT 24 04/04/2008   AST 17 04/04/2008   NA 135 07/05/2012   K 4.1 07/05/2012   CL 96 07/05/2012   CREATININE 1.16* 07/05/2012   BUN 31* 07/05/2012   CO2 31 07/05/2012   TSH 2.776 06/21/2012   INR 1.48 07/06/2012   HGBA1C 9.0* 06/27/2012      Assessment / Plan:     On treated diabetic presents with class for CHF bilateral DVT Escherichia coli UTI acute right popliteal artery thrombosis with EF of 20-25% and diffuse three-vessel diabetic type disease.  Surgical revascularization would be her best long-term therapy to improve survival however she is not a candidate for surgery at this time with active DVT, cellulitis, uncontrolled diabetes and the UTI. Recommend continue transition to Coumadin, begin on diabetic therapy and start heart failure meds. Will order for inpatient cardiac rehabilitation and  followup with outpatient PT to improve conditioning prior to high-risk surgery if the patient wishes to accept high-risk CABG after her medical problems are optimized. Her hyperparathyroidism-hypercalcemia will have to be treated later if she wishes CABG  Will follow       @me1 @ 07/06/2012 7:07 PM

## 2012-07-06 NOTE — Progress Notes (Signed)
[facility]  DAILY PROGRESS NOTE   Subjective:  Feels tired, no chest pain. cMRI shows severe cardiomyopathy with LAD and RCA territory viability, up to 50% scar in the LCX/OM territory.   Objective:  Temp:  [97.7 F (36.5 C)-98.4 F (36.9 C)] 98.4 F (36.9 C) (11/13 0451) Pulse Rate:  [76-91] 90  (11/13 1500) Resp:  [18-20] 18  (11/13 1500) BP: (102-128)/(58-90) 119/90 mmHg (11/13 1500) SpO2:  [94 %-98 %] 97 % (11/13 1500) Weight:  [73.256 kg (161 lb 8 oz)-74.299 kg (163 lb 12.8 oz)] 74.299 kg (163 lb 12.8 oz) (11/13 0451) Weight change: -0.844 kg (-1 lb 13.8 oz)  Intake/Output from previous day: 11/12 0701 - 11/13 0700 In: 480 [P.O.:480] Out: 700 [Urine:700]  Intake/Output from this shift: Total I/O In: 360 [P.O.:360] Out: 225 [Urine:225]  Medications: Current Facility-Administered Medications  Medication Dose Route Frequency Provider Last Rate Last Dose  . [EXPIRED] 0.9 %  sodium chloride infusion  1 mL/kg/hr Intravenous Continuous Marykay Lex, MD 74.1 mL/hr at 07/05/12 1853 1 mL/kg/hr at 07/05/12 1853  . 0.9 %  sodium chloride infusion  250 mL Intravenous Continuous Marykay Lex, MD 1 mL/hr at 07/06/12 0155 250 mL at 07/06/12 0155  . acetaminophen (TYLENOL) tablet 650 mg  650 mg Oral Q4H PRN Marykay Lex, MD      . aspirin EC tablet 81 mg  81 mg Oral Daily Henderson Cloud, MD   81 mg at 07/06/12 1016  . atorvastatin (LIPITOR) tablet 10 mg  10 mg Oral q1800 Henderson Cloud, MD   10 mg at 07/04/12 1812  . bisacodyl (DULCOLAX) suppository 10 mg  10 mg Rectal Daily PRN Henderson Cloud, MD   10 mg at 06/30/12 2327  . carvedilol (COREG) tablet 3.125 mg  3.125 mg Oral BID WC Nada Boozer, NP   3.125 mg at 07/06/12 1020  . diazepam (VALIUM) tablet 5 mg  5 mg Oral Pre-Cath Marykay Lex, MD      . docusate sodium (COLACE) capsule 100 mg  100 mg Oral BID Houston Siren, MD   100 mg at 07/06/12 1018  . feeding  supplement (ENSURE COMPLETE) liquid 237 mL  237 mL Oral BID BM Jeoffrey Massed, RD   237 mL at 07/06/12 1431  . furosemide (LASIX) injection 40 mg  40 mg Intravenous BID Dorothea Ogle, MD   40 mg at 07/06/12 1017  . gadobenate dimeglumine (MULTIHANCE) injection 25 mL  25 mL Intravenous Once PRN Medication Radiologist, MD      . heparin ADULT infusion 100 units/mL (25000 units/250 mL)  1,450 Units/hr Intravenous Continuous Brett Fairy, PHARMD 14.5 mL/hr at 07/06/12 1437 1,450 Units/hr at 07/06/12 1437  . insulin aspart (novoLOG) injection 0-9 Units  0-9 Units Subcutaneous TID WC Dorothea Ogle, MD   2 Units at 07/06/12 [phone]  . insulin glargine (LANTUS) injection 18 Units  18 Units Subcutaneous QHS Henderson Cloud, MD   18 Units at 07/05/12 2125  . living well with diabetes book MISC   Does not apply Once Provider Default, MD      . LORazepam (ATIVAN) tablet 0.25 mg  0.25 mg Oral Q12H PRN Henderson Cloud, MD   0.25 mg at 07/04/12 2306  . morphine 2 MG/ML injection 2 mg  2 mg Intravenous Q2H PRN Houston Siren, MD   2 mg at 07/06/12 1449  . ondansetron (ZOFRAN) tablet 4 mg  4 mg Oral Q6H PRN Houston Siren, MD   4 mg at 06/29/12 0128   Or  . ondansetron [facility]) injection 4 mg  4 mg Intravenous Q6H PRN Houston Siren, MD   4 mg at 06/29/12 1734  . ondansetron (ZOFRAN) injection 4 mg  4 mg Intravenous Q6H PRN Marykay Lex, MD      . oxyCODONE-acetaminophen (PERCOCET/ROXICET) 5-325 MG per tablet 1-2 tablet  1-2 tablet Oral Q4H PRN Dorothea Ogle, MD   1 tablet at 07/06/12 0043  . polyethylene glycol (MIRALAX / GLYCOLAX) packet 17 g  17 g Oral Daily Henderson Cloud, MD   17 g at 07/06/12 1018  . sodium chloride 0.9 % injection 3 mL  3 mL Intravenous Q12H Houston Siren, MD   3 mL at 07/06/12 1025  . sodium chloride 0.9 % injection 3 mL  3 mL Intravenous Q12H Marykay Lex, MD   3 mL at 07/06/12 1000  . sodium chloride 0.9 % injection 3 mL  3 mL Intravenous PRN Marykay Lex, MD      .  spironolactone (ALDACTONE) tablet 12.5 mg  12.5 mg Oral BID Nada Boozer, NP   12.5 mg at 07/06/12 1017  . [COMPLETED] warfarin (COUMADIN) tablet 4 mg  4 mg Oral Once Henderson Cloud, MD   4 mg at 07/05/12 2125  . warfarin (COUMADIN) tablet 5 mg  5 mg Oral ONCE-1800 Brett Fairy, PHARMD      . Warfarin - Pharmacist Dosing Inpatient   Does not apply q1800 Henderson Cloud, MD      . [DISCONTINUED] 0.9 %  sodium chloride infusion  250 mL Intravenous Continuous Nada Boozer, NP 50 mL/hr at 07/05/12 0930 250 mL at 07/05/12 0930  . [DISCONTINUED] heparin ADULT infusion 100 units/mL (25000 units/250 mL)  1,400 Units/hr Intravenous Continuous Lorenza Evangelist, PHARMD 14 mL/hr at 07/05/12 0146 1,400 Units/hr at 07/05/12 0146  . [DISCONTINUED] sodium chloride 0.9 % injection 3 mL  3 mL Intravenous Q12H Nada Boozer, NP      . [DISCONTINUED] sodium chloride 0.9 % injection 3 mL  3 mL Intravenous PRN Nada Boozer, NP        Physical Exam: General appearance: alert and no distress Neck: no adenopathy, no carotid bruit, no JVD, supple, symmetrical, trachea midline and thyroid not enlarged, symmetric, no tenderness/mass/nodules Lungs: clear to auscultation bilaterally Heart: regular rate and rhythm, S1, S2 normal, no murmur, click, rub or gallop Abdomen: soft, non-tender; bowel sounds normal; no masses,  no organomegaly Extremities: extremities normal, atraumatic, no cyanosis or edema Pulses: 2+ and symmetric Skin: Skin color, texture, turgor normal. No rashes or lesions Neurologic: Grossly normal  Lab Results: Results for orders placed during the hospital encounter of 06/21/12 (from the past 48 hour(s))  GLUCOSE, CAPILLARY     Status: Abnormal   Collection Time   07/04/12  5:21 PM      Component Value Range Comment   Glucose-Capillary 290 (*) 70 - 99 mg/dL   GLUCOSE, CAPILLARY     Status: Abnormal   Collection Time   07/04/12 10:43 PM      Component Value Range Comment    Glucose-Capillary 228 (*) 70 - 99 mg/dL   HEPARIN LEVEL (UNFRACTIONATED)     Status: Abnormal   Collection Time   07/05/12 12:40 AM      Component Value Range Comment   Heparin Unfractionated 0.10 (*) 0.30 - 0.70 IU/mL   PROTIME-INR  Status: Abnormal   Collection Time   07/05/12  4:21 AM      Component Value Range Comment   Prothrombin Time 18.3 (*) 11.6 - 15.2 seconds    INR 1.57 (*) 0.00 - 1.49   BASIC METABOLIC PANEL     Status: Abnormal   Collection Time   07/05/12  4:21 AM      Component Value Range Comment   Sodium 135  135 - 145 mEq/L    Potassium 4.1  3.5 - 5.1 mEq/L    Chloride 96  96 - 112 mEq/L    CO2 31  19 - 32 mEq/L    Glucose, Bld 182 (*) 70 - 99 mg/dL    BUN 31 (*) 6 - 23 mg/dL    Creatinine, Ser 1.61 (*) 0.50 - 1.10 mg/dL    Calcium 09.6 (*) 8.4 - 10.5 mg/dL    GFR calc non Af Amer 48 (*) >90 mL/min    GFR calc Af Amer 56 (*) >90 mL/min   PRO B NATRIURETIC PEPTIDE     Status: Abnormal   Collection Time   07/05/12  4:21 AM      Component Value Range Comment   Pro B Natriuretic peptide (BNP) 6853.0 (*) 0 - 125 pg/mL   GLUCOSE, CAPILLARY     Status: Abnormal   Collection Time   07/05/12  7:36 AM      Component Value Range Comment   Glucose-Capillary 171 (*) 70 - 99 mg/dL   HEPARIN LEVEL (UNFRACTIONATED)     Status: Normal   Collection Time   07/05/12  8:30 AM      Component Value Range Comment   Heparin Unfractionated 0.57  0.30 - 0.70 IU/mL   POCT I-STAT 3, BLOOD GAS (G3P V)     Status: Abnormal   Collection Time   07/05/12  2:20 PM      Component Value Range Comment   pH, Ven 7.320 (*) 7.250 - 7.300    pCO2, Ven 59.0 (*) 45.0 - 50.0 mmHg    pO2, Ven 27.0 (*) 30.0 - 45.0 mmHg    Bicarbonate 30.4 (*) 20.0 - 24.0 mEq/L    TCO2 32  0 - 100 mmol/L    O2 Saturation 45.0      Acid-Base Excess 3.0 (*) 0.0 - 2.0 mmol/L    Sample type VENOUS      Comment NOTIFIED PHYSICIAN     POCT I-STAT 3, BLOOD GAS (G3P V)     Status: Abnormal   Collection Time    07/05/12  2:22 PM      Component Value Range Comment   pH, Ven 7.406 (*) 7.250 - 7.300    pCO2, Ven 50.6 (*) 45.0 - 50.0 mmHg    pO2, Ven 42.0  30.0 - 45.0 mmHg    Bicarbonate 31.8 (*) 20.0 - 24.0 mEq/L    TCO2 33  0 - 100 mmol/L    O2 Saturation 77.0      Acid-Base Excess 6.0 (*) 0.0 - 2.0 mmol/L    Sample type VENOUS     POCT I-STAT 3, BLOOD GAS (G3+)     Status: Abnormal   Collection Time   07/05/12  2:32 PM      Component Value Range Comment   pH, Arterial 7.377  7.350 - 7.450    pCO2 arterial 52.2 (*) 35.0 - 45.0 mmHg    pO2, Arterial 65.0 (*) 80.0 - 100.0 mmHg    Bicarbonate 30.7 (*) 20.0 -  24.0 mEq/L    TCO2 32  0 - 100 mmol/L    O2 Saturation 92.0      Acid-Base Excess 5.0 (*) 0.0 - 2.0 mmol/L    Sample type ARTERIAL     POCT I-STAT 3, BLOOD GAS (G3P V)     Status: Abnormal   Collection Time   07/05/12  2:32 PM      Component Value Range Comment   pH, Ven 7.363 (*) 7.250 - 7.300    pCO2, Ven 55.7 (*) 45.0 - 50.0 mmHg    pO2, Ven 30.0  30.0 - 45.0 mmHg    Bicarbonate 31.7 (*) 20.0 - 24.0 mEq/L    TCO2 33  0 - 100 mmol/L    O2 Saturation 52.0      Acid-Base Excess 5.0 (*) 0.0 - 2.0 mmol/L    Sample type VENOUS      Comment NOTIFIED PHYSICIAN     POCT I-STAT 3, BLOOD GAS (G3P V)     Status: Abnormal   Collection Time   07/05/12  2:36 PM      Component Value Range Comment   pH, Ven 7.366 (*) 7.250 - 7.300    pCO2, Ven 56.0 (*) 45.0 - 50.0 mmHg    pO2, Ven 29.0 (*) 30.0 - 45.0 mmHg    Bicarbonate 32.1 (*) 20.0 - 24.0 mEq/L    TCO2 34  0 - 100 mmol/L    O2 Saturation 52.0      Acid-Base Excess 6.0 (*) 0.0 - 2.0 mmol/L    Sample type VENOUS      Comment NOTIFIED PHYSICIAN     POCT I-STAT 3, BLOOD GAS (G3P V)     Status: Abnormal   Collection Time   07/05/12  2:40 PM      Component Value Range Comment   pH, Ven 7.358 (*) 7.250 - 7.300    pCO2, Ven 56.0 (*) 45.0 - 50.0 mmHg    pO2, Ven 33.0  30.0 - 45.0 mmHg    Bicarbonate 31.5 (*) 20.0 - 24.0 mEq/L    TCO2 33   0 - 100 mmol/L    O2 Saturation 60.0      Acid-Base Excess 5.0 (*) 0.0 - 2.0 mmol/L    Sample type VENOUS      Comment NOTIFIED PHYSICIAN     POCT ACTIVATED CLOTTING TIME     Status: Normal   Collection Time   07/05/12  3:08 PM      Component Value Range Comment   Activated Clotting Time 144     GLUCOSE, CAPILLARY     Status: Abnormal   Collection Time   07/05/12  3:23 PM      Component Value Range Comment   Glucose-Capillary 123 (*) 70 - 99 mg/dL   GLUCOSE, CAPILLARY     Status: Abnormal   Collection Time   07/05/12  5:01 PM      Component Value Range Comment   Glucose-Capillary 144 (*) 70 - 99 mg/dL   GLUCOSE, CAPILLARY     Status: Abnormal   Collection Time   07/05/12  8:07 PM      Component Value Range Comment   Glucose-Capillary 206 (*) 70 - 99 mg/dL    Comment 1 Notify RN      Comment 2 Documented in Chart     GLUCOSE, CAPILLARY     Status: Abnormal   Collection Time   07/06/12  6:35 AM      Component Value Range  Comment   Glucose-Capillary 172 (*) 70 - 99 mg/dL    Comment 1 Notify RN      Comment 2 Documented in Chart     HEPARIN LEVEL (UNFRACTIONATED)     Status: Normal   Collection Time   07/06/12  8:20 AM      Component Value Range Comment   Heparin Unfractionated 0.30  0.30 - 0.70 IU/mL   CBC     Status: Abnormal   Collection Time   07/06/12  8:20 AM      Component Value Range Comment   WBC 9.7  4.0 - 10.5 K/uL    RBC 3.86 (*) 3.87 - 5.11 MIL/uL    Hemoglobin 9.9 (*) 12.0 - 15.0 g/dL    HCT 78.2 (*) 95.6 - 46.0 %    MCV 80.6  78.0 - 100.0 fL    MCH 25.6 (*) 26.0 - 34.0 pg    MCHC 31.8  30.0 - 36.0 g/dL    RDW 21.3  08.6 - 57.8 %    Platelets 283  150 - 400 K/uL   PROTIME-INR     Status: Abnormal   Collection Time   07/06/12  8:20 AM      Component Value Range Comment   Prothrombin Time 17.5 (*) 11.6 - 15.2 seconds    INR 1.48  0.00 - 1.49   GLUCOSE, CAPILLARY     Status: Abnormal   Collection Time   07/06/12  2:29 PM      Component Value Range  Comment   Glucose-Capillary 166 (*) 70 - 99 mg/dL     Imaging: Mr Card Morphology Wo/w Cm  07/06/2012  *RADIOLOGY REPORT*  Clinical Data: Multivessel coronary artery disease with a large infarct area noted on Myoview stress.  MR CARDIA MORPHOLOGY WITHOUT AND WITH CONTRAST  Contrast:  25 ml Multihance  Comparison: No priors.  Findings:  LEFT VENTRICULAR FUNCTION - End-diastolic volume: 175ml End-systolic volume: 952 ml Stroke volume: 38 ml Ejection fraction: 22.0% Cardiac output: 3347 ml/minute  WALL MOTION ASSESSMENT - Base:  Akinesis of the inferolateral wall segment (although this wall segment appears to move, this is likely from tethering rather than true contraction and there is no appreciable wall thickening on short axis images).  Otherwise, severely hypokinetic. Midventricle: Akinesis of the inferolateral wall segment (although this wall segment appears to move, this is likely from tethering rather than true contraction and there is no appreciable wall thickening on short axis images).  Otherwise, severely hypokinetic. Apex: Severe hypokinesis.  CARDIAC MEASUREMENTS - Left ventricular end-diastolic diameter: 59 mm Left ventricular end-systolic diameter: 52 mm Septal thickness: 9 mm Posterior wall thickness: 8mm Left atrial diameter: 42 mm  VALVES - Quantitative analysis with phase contrast imaging was not performed. Aortic valve: Unremarkable. Mitral valve: Unremarkable. Tricuspid valve: Unremarkable.  DELAYED ENHANCEMENT - Base: 0-25% anterolateral wall (portions involved are 25-50% transmural emanating from the subendocardium). 25-50% inferolateral wall (also 25-50% transmural emanating from the subendocardium). Otherwise, negative. Midventricle: 25-50% inferolateral wall (also 25-50% transmural emanating from the subendocardium). Otherwise, negative. Apex: Negative.  PERICARDIUM:  Small pericardial effusion.  During real time dynamic imaging with a deep inspiratory effort there was no significant  straightening or bowing of the interventricular septum to suggest ventricular interdependence.  ADDITIONAL FINDINGS:  Moderate bilateral pleural effusions.  IMPRESSION: 1.  Left ventricular dilatation at both end-diastole and end- systole with severely reduced resting left ventricular systolic function.  Calculated resting left ventricular ejection fraction of 22%. 2.  There are  areas of delayed myocardial hyperenhancement in the basal and midventricular lateral wall segments, as detailed above, compatible with scarring from prior left circumflex/OM territory myocardial infarction.  These regions do appear to be more severely hypokinetic or frankly akinetic, however, this is difficult to judge given the background of severe global hypokinesis.  Given the fact that these regions of delayed enhancement are less than 50% transmural, these myocardial segments may be viable. 3.  No other areas of delayed enhancement to suggest additional areas of scarring.  Accordingly, the remainder of the myocardium including the entire LAD and RCA territory appears to be viable. 4.  Small pericardial effusion.  This does not appear to be hemodynamically significant at this time. 5.  Moderate bilateral pleural effusions.   Original Report Authenticated By: Trudie Reed, M.D.     Assessment:  1. Principal Problem: 2.  *Acute systolic CHF (congestive heart failure) 3. Active Problems: 4.  Pedal edema 5.  Cellulitis 6.  LBBB (left bundle branch block) 7.  DVT Oct 2013 8.  DM (diabetes mellitus) 9.  CKD (chronic kidney disease) stage 3, GFR 30-59 ml/min 10.  Ischemic cardiomyopathy, EF 25% 11.  Popliteal artery thrombosis, right 12.  CAD (coronary artery disease), cath 07/05/12 13.   Plan:  1. Multivessel CAD, depressed LVEF, diabetic patient. cMRI shows generally good viability, therefore, CABG would be ideal treatment of choice. I discussed this with her today and will contact [facility] for a consult. She has pleaded  with me to ask Dr. Bonnee Quin to come by and see her to give an opinion before considering surgery. I will try to contact him.  Time Spent Directly with Patient:  15 minutes  Length of Stay:  LOS: 15 days   Chrystie Nose, MD, [facility] Attending Cardiologist [facility]  Zyair Russi C 07/06/2012, 3:30 PM

## 2012-07-06 NOTE — Progress Notes (Signed)
Received order post cath for "If PCI" however pt did not have PCI. Would be happy to see pt if warranted. Please re-order appropriately if CR services desired. Thx. Ethelda Chick CES, ACSM

## 2012-07-06 NOTE — Progress Notes (Signed)
ANTICOAGULATION CONSULT NOTE - Follow Up Consult  Pharmacy Consult for Heparin/Coumadin Indication: DVT  Allergies  Allergen Reactions  . Penicillins Anaphylaxis  . Sulfa Antibiotics Anaphylaxis    Patient Measurements: Height: 5\' 6"  (167.6 cm) Weight: 163 lb 12.8 oz (74.299 kg) (scale b) IBW/kg (Calculated) : 59.3   Vital Signs: Temp: 98.4 F (36.9 C) (11/13 0451) Temp src: Oral (11/13 0451) BP: 102/58 mmHg (11/13 0451) Pulse Rate: 80  (11/13 0451)  Labs:  Basename 07/06/12 0820 07/05/12 0830 07/05/12 0421 07/05/12 0040 07/04/12 0443  HGB 9.9* -- -- -- --  HCT 31.1* -- -- -- --  PLT 283 -- -- -- --  APTT -- -- -- -- --  LABPROT 17.5* -- 18.3* -- 22.1*  INR 1.48 -- 1.57* -- 2.03*  HEPARINUNFRC 0.30 0.57 -- 0.10* --  CREATININE -- -- 1.16* -- 1.18*  CKTOTAL -- -- -- -- --  CKMB -- -- -- -- --  TROPONINI -- -- -- -- --    Estimated Creatinine Clearance: 49.8 ml/min (by C-G formula based on Cr of 1.16).  Assessment: [name] [name] is a 66 year old female resumed on heparin/coumadin s/p cath for DVT. Day 1 overlap. Her heparin level is wnl but on the lower end, was therapeutic at current drip rate of 1400units/hr prior to cath as well.  INR is subtherapeutic. CBC consistent with anemia post cath. Plts wnl. No bleeding noted.   Goal of Therapy:  INR 2-3, Heparin level 0.3-0.7 Monitor platelets by anticoagulation protocol: Yes   Plan:  Increase heparin to 1450 units/hr and f/u HL in the morning Coumadin 5mg  po x 1 dose tonight F/u with INR in the AM  Thank you,  Brett Fairy, PharmD 07/06/2012 10:24 AM

## 2012-07-07 ENCOUNTER — Inpatient Hospital Stay (HOSPITAL_COMMUNITY): Payer: Medicare Other

## 2012-07-07 DIAGNOSIS — Z01810 Encounter for preprocedural cardiovascular examination: Secondary | ICD-10-CM

## 2012-07-07 LAB — GLUCOSE, CAPILLARY
Glucose-Capillary: 182 mg/dL — ABNORMAL HIGH (ref 70–99)
Glucose-Capillary: 141 mg/dL — ABNORMAL HIGH (ref 70–99)
Glucose-Capillary: 278 mg/dL — ABNORMAL HIGH (ref 70–99)

## 2012-07-07 LAB — SURGICAL PCR SCREEN
MRSA, PCR: NEGATIVE
Staphylococcus aureus: NEGATIVE

## 2012-07-07 LAB — PROTIME-INR: Prothrombin Time: 17.9 seconds — ABNORMAL HIGH (ref 11.6–15.2)

## 2012-07-07 LAB — CBC
Platelets: 301 10*3/uL (ref 150–400)
RBC: 3.64 MIL/uL — ABNORMAL LOW (ref 3.87–5.11)
RDW: 15.5 % (ref 11.5–15.5)
WBC: 11.6 10*3/uL — ABNORMAL HIGH (ref 4.0–10.5)

## 2012-07-07 LAB — TSH: TSH: 5.915 u[IU]/mL — ABNORMAL HIGH (ref 0.350–4.500)

## 2012-07-07 LAB — ANTITHROMBIN III: AntiThromb III Func: 87 % (ref 75–120)

## 2012-07-07 MED ORDER — ALBUTEROL SULFATE (5 MG/ML) 0.5% IN NEBU
2.5000 mg | INHALATION_SOLUTION | Freq: Once | RESPIRATORY_TRACT | Status: AC
  Administered 2012-07-07: 2.5 mg via RESPIRATORY_TRACT

## 2012-07-07 MED ORDER — ENOXAPARIN SODIUM 80 MG/0.8ML ~~LOC~~ SOLN
1.0000 mg/kg | Freq: Two times a day (BID) | SUBCUTANEOUS | Status: DC
  Administered 2012-07-07 – 2012-07-09 (×4): 75 mg via SUBCUTANEOUS
  Filled 2012-07-07 (×5): qty 0.8

## 2012-07-07 MED ORDER — CARVEDILOL 6.25 MG PO TABS
6.2500 mg | ORAL_TABLET | Freq: Two times a day (BID) | ORAL | Status: DC
  Administered 2012-07-08 (×2): 6.25 mg via ORAL
  Filled 2012-07-07 (×5): qty 1

## 2012-07-07 MED ORDER — WARFARIN SODIUM 5 MG PO TABS
5.0000 mg | ORAL_TABLET | Freq: Once | ORAL | Status: AC
  Administered 2012-07-07: 5 mg via ORAL
  Filled 2012-07-07: qty 1

## 2012-07-07 NOTE — Progress Notes (Signed)
VASCULAR LAB PRELIMINARY  PRELIMINARY  PRELIMINARY  PRELIMINARY  Pre-op Cardiac Surgery  Carotid Findings:  Bilateral:  No evidence of hemodynamically significant internal carotid artery stenosis.   Vertebral artery flow is antegrade.      Upper Extremity Right Left  Brachial Pressures 81 Triphasic  95 Triphasic   Radial Waveforms Triphasic  Triphasic   Ulnar Waveforms Triphasic  Triphasic   Palmar Arch (Allen's Test) Within normal limits  Within normal limits       Lower  Extremity Right Left  Dorsalis Pedis Dampened monophasic  104 Triphasic   Anterior Tibial    Posterior Tibial absent 118 Triphasic   Ankle/Brachial Indices Pressures not taken due to calf DVT and pain.  Known occlusion of FA. 1.24     Lynden Flemmer, RVT 07/07/2012, 10:52 AM

## 2012-07-07 NOTE — Progress Notes (Signed)
ANTICOAGULATION CONSULT NOTE - Follow Up Consult  Pharmacy Consult for Heparin/Coumadin Indication: DVT  Allergies  Allergen Reactions  . Penicillins Anaphylaxis  . Sulfa Antibiotics Anaphylaxis    Patient Measurements: Height: 5\' 6"  (167.6 cm) Weight: 162 lb 7.7 oz (73.7 kg) IBW/kg (Calculated) : 59.3   Vital Signs: Temp: 98.8 F (37.1 C) (11/14 0437) Temp src: Oral (11/14 0437) BP: 99/49 mmHg (11/14 0437) Pulse Rate: 85  (11/14 0437)  Labs:  Basename 07/07/12 0600 07/06/12 0820 07/05/12 0830 07/05/12 0421  HGB 9.4* 9.9* -- --  HCT 29.4* 31.1* -- --  PLT 301 283 -- --  APTT -- -- -- --  LABPROT 17.9* 17.5* -- 18.3*  INR 1.52* 1.48 -- 1.57*  HEPARINUNFRC 0.83* 0.30 0.57 --  CREATININE -- -- -- 1.16*  CKTOTAL -- -- -- --  CKMB -- -- -- --  TROPONINI -- -- -- --    Estimated Creatinine Clearance: 49.7 ml/min (by C-G formula based on Cr of 1.16).  Assessment: Miss Wisor is a 66 year old female resumed on heparin/coumadin s/p cath for DVT. Her heparin level is 0.83.  INR is 1.57. CBC consistent with anemia post cath. Plts wnl. No bleeding noted.   Goal of Therapy:  INR 2-3, Heparin level 0.3-0.7 Monitor platelets by anticoagulation protocol: Yes   Plan:  Decrease heparin to 1350 units/hr and f/u HL in this afternoon Coumadin 5 mg po x 1 dose tonight   Thank you,  Talbert Cage, PharmD 07/07/2012 8:48 AM

## 2012-07-07 NOTE — Progress Notes (Signed)
[facility]  DAILY PROGRESS NOTE   Subjective:  No angina or dyspnea. Able to lie fully supine without difficulty.  Objective:  Temp:  [97.6 F (36.4 C)-100.1 F (37.8 C)] 97.6 F (36.4 C) (11/14 1326) Pulse Rate:  [83-99] 83  (11/14 1326) Resp:  [18-20] 20  (11/14 1326) BP: (93-108)/(49-66) 93/56 mmHg (11/14 1326) SpO2:  [94 %-95 %] 95 % (11/14 1326) Weight:  [73.7 kg (162 lb 7.7 oz)] 73.7 kg (162 lb 7.7 oz) (11/14 0437) Weight change: 0.444 kg (15.7 oz)  Intake/Output from previous day: 11/13 0701 - 11/14 0700 In: 840 [P.O.:840] Out: 1150 [Urine:1150]  Intake/Output from this shift: Total I/O In: 240 [P.O.:240] Out: -   Medications: Current Facility-Administered Medications  Medication Dose Route Frequency Provider Last Rate Last Dose  . acetaminophen (TYLENOL) tablet 650 mg  650 mg Oral Q4H PRN Marykay Lex, MD      . [COMPLETED] albuterol (PROVENTIL) (5 MG/ML) 0.5% nebulizer solution 2.5 mg  2.5 mg Nebulization Once Kerin Perna, MD   2.5 mg at 07/07/12 0859  . aspirin EC tablet 81 mg  81 mg Oral Daily Henderson Cloud, MD   81 mg at 07/07/12 1125  . atorvastatin (LIPITOR) tablet 10 mg  10 mg Oral q1800 Henderson Cloud, MD   10 mg at 07/06/12 1835  . bisacodyl (DULCOLAX) suppository 10 mg  10 mg Rectal Daily PRN Henderson Cloud, MD   10 mg at 06/30/12 2327  . carvedilol (COREG) tablet 3.125 mg  3.125 mg Oral BID WC Nada Boozer, NP   3.125 mg at 07/07/12 1127  . [EXPIRED] diazepam (VALIUM) tablet 5 mg  5 mg Oral Pre-Cath Marykay Lex, MD      . docusate sodium (COLACE) capsule 100 mg  100 mg Oral BID Houston Siren, MD   100 mg at 07/07/12 1124  . feeding supplement (ENSURE COMPLETE) liquid 237 mL  237 mL Oral BID BM Jeoffrey Massed, RD   237 mL at 07/07/12 1446  . furosemide (LASIX) injection 40 mg  40 mg Intravenous BID Dorothea Ogle, MD   40 mg at 07/07/12 1126  . [EXPIRED] gadobenate dimeglumine  (MULTIHANCE) injection 25 mL  25 mL Intravenous Once PRN Medication Radiologist, MD      . insulin aspart (novoLOG) injection 0-9 Units  0-9 Units Subcutaneous TID WC Dorothea Ogle, MD   1 Units at 07/07/12 1159  . insulin glargine (LANTUS) injection 18 Units  18 Units Subcutaneous QHS Henderson Cloud, MD   18 Units at 07/06/12 2200  . living well with diabetes book MISC   Does not apply Once Provider Default, MD      . LORazepam (ATIVAN) tablet 0.25 mg  0.25 mg Oral Q12H PRN Henderson Cloud, MD   0.25 mg at 07/04/12 2306  . morphine 2 MG/ML injection 2 mg  2 mg Intravenous Q2H PRN Houston Siren, MD   2 mg at 07/06/12 1449  . ondansetron (ZOFRAN) tablet 4 mg  4 mg Oral Q6H PRN Houston Siren, MD   4 mg at 06/29/12 0128   Or  . ondansetron (ZOFRAN) injection 4 mg  4 mg Intravenous Q6H PRN Houston Siren, MD   4 mg at 06/29/12 1734  . ondansetron (ZOFRAN) injection 4 mg  4 mg Intravenous Q6H PRN Marykay Lex, MD      . oxyCODONE-acetaminophen (PERCOCET/ROXICET) 5-325 MG per tablet 1-2 tablet  1-2 tablet Oral Q4H PRN Dorothea Ogle, MD   2 tablet at 07/07/12 0533  . polyethylene glycol (MIRALAX / GLYCOLAX) packet 17 g  17 g Oral Daily Henderson Cloud, MD   17 g at 07/06/12 1018  . sodium chloride 0.9 % injection 3 mL  3 mL Intravenous Q12H Houston Siren, MD   3 mL at 07/07/12 1134  . spironolactone (ALDACTONE) tablet 12.5 mg  12.5 mg Oral BID Nada Boozer, NP   12.5 mg at 07/07/12 1145  . [COMPLETED] warfarin (COUMADIN) tablet 5 mg  5 mg Oral ONCE-1800 Brett Fairy, PHARMD   5 mg at 07/06/12 1834  . warfarin (COUMADIN) tablet 5 mg  5 mg Oral ONCE-1800 Marykay Lex, MD      . Warfarin - Pharmacist Dosing Inpatient   Does not apply q1800 Henderson Cloud, MD      . zolpidem [facility]) tablet 5 mg  5 mg Oral QHS PRN Abelino Derrick, PA      . [DISCONTINUED] 0.9 %  sodium chloride infusion  250 mL Intravenous Continuous Marykay Lex, MD 1 mL/hr at 07/07/12 0743 250 mL at 07/07/12 0743    . [DISCONTINUED] heparin ADULT infusion 100 units/mL (25000 units/250 mL)  1,350 Units/hr Intravenous Continuous Marykay Lex, MD 13.5 mL/hr at 07/07/12 1547 1,350 Units/hr at 07/07/12 1547  . [DISCONTINUED] sodium chloride 0.9 % injection 3 mL  3 mL Intravenous Q12H Marykay Lex, MD   3 mL at 07/07/12 1135  . [DISCONTINUED] sodium chloride 0.9 % injection 3 mL  3 mL Intravenous PRN Marykay Lex, MD        Physical Exam: General appearance: alert, cooperative and no distress Neck: no adenopathy, no carotid bruit, no JVD, supple, symmetrical, trachea midline and thyroid not enlarged, symmetric, no tenderness/mass/nodules Lungs: clear to auscultation bilaterally Heart: impulse: LV lift and regular rate and rhythm Abdomen: soft, non-tender; bowel sounds normal; no masses,  no organomegaly Extremities: extremities normal, atraumatic, no cyanosis or edema Pulses: 2+ and symmetric Neurologic: Alert and oriented X 3, normal strength and tone. Normal symmetric reflexes. Normal coordination and gait  Lab Results: Results for orders placed during the hospital encounter of 06/21/12 (from the past 48 hour(s))  GLUCOSE, CAPILLARY     Status: Abnormal   Collection Time   07/05/12  5:01 PM      Component Value Range Comment   Glucose-Capillary 144 (*) 70 - 99 mg/dL   GLUCOSE, CAPILLARY     Status: Abnormal   Collection Time   07/05/12  8:07 PM      Component Value Range Comment   Glucose-Capillary 206 (*) 70 - 99 mg/dL    Comment 1 Notify RN      Comment 2 Documented in Chart     GLUCOSE, CAPILLARY     Status: Abnormal   Collection Time   07/06/12  6:35 AM      Component Value Range Comment   Glucose-Capillary 172 (*) 70 - 99 mg/dL    Comment 1 Notify RN      Comment 2 Documented in Chart     HEPARIN LEVEL (UNFRACTIONATED)     Status: Normal   Collection Time   07/06/12  8:20 AM      Component Value Range Comment   Heparin Unfractionated 0.30  0.30 - 0.70 IU/mL   CBC     Status:  Abnormal   Collection Time   07/06/12  8:20 AM  Component Value Range Comment   WBC 9.7  4.0 - 10.5 K/uL    RBC 3.86 (*) 3.87 - 5.11 MIL/uL    Hemoglobin 9.9 (*) 12.0 - 15.0 g/dL    HCT 62.1 (*) 30.8 - 46.0 %    MCV 80.6  78.0 - 100.0 fL    MCH 25.6 (*) 26.0 - 34.0 pg    MCHC 31.8  30.0 - 36.0 g/dL    RDW 65.7  84.6 - 96.2 %    Platelets 283  150 - 400 K/uL   PROTIME-INR     Status: Abnormal   Collection Time   07/06/12  8:20 AM      Component Value Range Comment   Prothrombin Time 17.5 (*) 11.6 - 15.2 seconds    INR 1.48  0.00 - 1.49   GLUCOSE, CAPILLARY     Status: Abnormal   Collection Time   07/06/12  2:29 PM      Component Value Range Comment   Glucose-Capillary 166 (*) 70 - 99 mg/dL   GLUCOSE, CAPILLARY     Status: Abnormal   Collection Time   07/06/12  4:09 PM      Component Value Range Comment   Glucose-Capillary 226 (*) 70 - 99 mg/dL    Comment 1 Notify RN     SURGICAL PCR SCREEN     Status: Normal   Collection Time   07/06/12  9:23 PM      Component Value Range Comment   MRSA, PCR NEGATIVE  NEGATIVE    Staphylococcus aureus NEGATIVE  NEGATIVE   GLUCOSE, CAPILLARY     Status: Abnormal   Collection Time   07/06/12  9:34 PM      Component Value Range Comment   Glucose-Capillary 188 (*) 70 - 99 mg/dL   GLUCOSE, CAPILLARY     Status: Abnormal   Collection Time   07/07/12  5:44 AM      Component Value Range Comment   Glucose-Capillary 182 (*) 70 - 99 mg/dL   HEPARIN LEVEL (UNFRACTIONATED)     Status: Abnormal   Collection Time   07/07/12  6:00 AM      Component Value Range Comment   Heparin Unfractionated 0.83 (*) 0.30 - 0.70 IU/mL   CBC     Status: Abnormal   Collection Time   07/07/12  6:00 AM      Component Value Range Comment   WBC 11.6 (*) 4.0 - 10.5 K/uL    RBC 3.64 (*) 3.87 - 5.11 MIL/uL    Hemoglobin 9.4 (*) 12.0 - 15.0 g/dL    HCT 95.2 (*) 84.1 - 46.0 %    MCV 80.8  78.0 - 100.0 fL    MCH 25.8 (*) 26.0 - 34.0 pg    MCHC 32.0  30.0 - 36.0  g/dL    RDW 32.4  40.1 - 02.7 %    Platelets 301  150 - 400 K/uL   PROTIME-INR     Status: Abnormal   Collection Time   07/07/12  6:00 AM      Component Value Range Comment   Prothrombin Time 17.9 (*) 11.6 - 15.2 seconds    INR 1.52 (*) 0.00 - 1.49   ANTITHROMBIN III     Status: Normal   Collection Time   07/07/12  6:00 AM      Component Value Range Comment   AntiThromb III Func 87  75 - 120 %   TSH     Status: Abnormal  Collection Time   07/07/12  6:00 AM      Component Value Range Comment   TSH 5.915 (*) 0.350 - 4.500 uIU/mL   GLUCOSE, CAPILLARY     Status: Abnormal   Collection Time   07/07/12 11:11 AM      Component Value Range Comment   Glucose-Capillary 141 (*) 70 - 99 mg/dL    Comment 1 Notify RN     GLUCOSE, CAPILLARY     Status: Abnormal   Collection Time   07/07/12  4:10 PM      Component Value Range Comment   Glucose-Capillary 192 (*) 70 - 99 mg/dL    Comment 1 Notify RN       Imaging: Mr Card Morphology Wo/w Cm  07/06/2012  *RADIOLOGY REPORT*  Clinical Data: Multivessel coronary artery disease with a large infarct area noted on Myoview stress.  MR CARDIA MORPHOLOGY WITHOUT AND WITH CONTRAST  Contrast:  25 ml Multihance  Comparison: No priors.  Findings:  LEFT VENTRICULAR FUNCTION - End-diastolic volume: 175ml End-systolic volume: 130 ml Stroke volume: 38 ml Ejection fraction: 22.0% Cardiac output: 3347 ml/minute  WALL MOTION ASSESSMENT - Base:  Akinesis of the inferolateral wall segment (although this wall segment appears to move, this is likely from tethering rather than true contraction and there is no appreciable wall thickening on short axis images).  Otherwise, severely hypokinetic. Midventricle: Akinesis of the inferolateral wall segment (although this wall segment appears to move, this is likely from tethering rather than true contraction and there is no appreciable wall thickening on short axis images).  Otherwise, severely hypokinetic. Apex: Severe  hypokinesis.  CARDIAC MEASUREMENTS - Left ventricular end-diastolic diameter: 59 mm Left ventricular end-systolic diameter: 52 mm Septal thickness: 9 mm Posterior wall thickness: 8mm Left atrial diameter: 42 mm  VALVES - Quantitative analysis with phase contrast imaging was not performed. Aortic valve: Unremarkable. Mitral valve: Unremarkable. Tricuspid valve: Unremarkable.  DELAYED ENHANCEMENT - Base: 0-25% anterolateral wall (portions involved are 25-50% transmural emanating from the subendocardium). 25-50% inferolateral wall (also 25-50% transmural emanating from the subendocardium). Otherwise, negative. Midventricle: 25-50% inferolateral wall (also 25-50% transmural emanating from the subendocardium). Otherwise, negative. Apex: Negative.  PERICARDIUM:  Small pericardial effusion.  During real time dynamic imaging with a deep inspiratory effort there was no significant straightening or bowing of the interventricular septum to suggest ventricular interdependence.  ADDITIONAL FINDINGS:  Moderate bilateral pleural effusions.  IMPRESSION: 1.  Left ventricular dilatation at both end-diastole and end- systole with severely reduced resting left ventricular systolic function.  Calculated resting left ventricular ejection fraction of 22%. 2.  There are areas of delayed myocardial hyperenhancement in the basal and midventricular lateral wall segments, as detailed above, compatible with scarring from prior left circumflex/OM territory myocardial infarction.  These regions do appear to be more severely hypokinetic or frankly akinetic, however, this is difficult to judge given the background of severe global hypokinesis.  Given the fact that these regions of delayed enhancement are less than 50% transmural, these myocardial segments may be viable. 3.  No other areas of delayed enhancement to suggest additional areas of scarring.  Accordingly, the remainder of the myocardium including the entire LAD and RCA territory appears  to be viable. 4.  Small pericardial effusion.  This does not appear to be hemodynamically significant at this time. 5.  Moderate bilateral pleural effusions.   Original Report Authenticated By: Trudie Reed, M.D.     Assessment:  1. Principal Problem: 2.  *Acute systolic CHF (congestive  heart failure) 3. Active Problems: 4.  Pedal edema 5.  Cellulitis 6.  LBBB (left bundle branch block) 7.  DVT Oct 2013 8.  DM (diabetes mellitus) 9.  CKD (chronic kidney disease) stage 3, GFR 30-59 ml/min 10.  Ischemic cardiomyopathy, EF 25% 11.  Popliteal artery thrombosis, right 12.  CAD (coronary artery disease), cath 07/05/12 13.   Plan:  1. She will need CABG, but this is not planned urgently. She wants to go home for a few days to spend time with family and friends before surgery. She will require physical therapy to optimize her statuis before surgery. She does not want inpatient rehab.  Will switch to SQ enoxaparin - she can self administer at home if INR is still <2 tomorrow. Will titrate up carvedilol. Reevaluate for discharge in AM.  Time Spent Directly with Patient:  35 minutes  Length of Stay:  LOS: 16 days    [name] [name] 07/07/2012, 4:57 PM

## 2012-07-07 NOTE — CV Procedure (Signed)
[facility]     CARDIAC CATHETERIZATION REPORT  [name] [name]   MRN: [id number] 03-31-1946   ADMIT DATE:  06/21/2012  Performing Cardiologist: Marykay Lex Primary Physician: No primary provider on file. Primary Cardiologist:  Seen in consultation by [facility]  Procedures Performed:  Left And Right Heart Catheterization via 5 Fr Right Common Femoral Arterial and 7 French Right Common Femoral Venous access  Left Ventriculography, (RAO)  12 ml/sec for  25 ml total contrast  Native Coronary Angiography  IC NTG Injection  Indication(s): Severe dilated Cardiomyopathy with abnormal Myoview suggesting Ischemic Cardiomyopathy  Acute (but likely on chronic), initial diagnosis of combined systolic and diastolic heart failure  Severe poorly controlled diabetes, and hypertension  History: 66 y.o. female with a recent diagnosis of severe dilated cardiomyopathy and abnormal Myoview demonstrating large anterior apical scar. EF is estimated the roughly 20.0% on echocardiogram. She is referred for invasive cardiac evaluation for likely coronary disease. She has been diuresed aggressively and therefore right heart catheterizations also be performed to determine adequacy of diuresis.  In addition to this she is also noted to have bilateral lower extremity DVTs as well as an occluded left popliteal artery.   Consent:  Risks of procedure as well as the alternatives and risks of each were explained to the (patient/caregiver).  Consent for procedure obtained.  Consent for signed by MD and patient with RN witness -- placed on chart.  Procedure: The patient was brought to the 2nd Floor [facility] in the fasting state and prepped and draped in the usual sterile fashion for Right groin access. Sterile technique was used including antiseptics, cap, gloves, gown, hand hygiene, mask and sheet.  Skin prep:  Chlorhexidine.  Time Out: Verified patient identification, verified procedure, site/side was marked, verified correct patient position, special equipment/implants available, medications/allergies/relevent history reviewed, required imaging and test results available.  Performed.  The patient was sedated with intravenous Fentanyl and Versed.  The right femoral head was identified using tactile and fluoroscopic technique.  The right groin was anesthetized with 1% subcutaneous Lidocaine.  The right Common Femoral Artery followed by the Right Common Femoral Vein were accessed using the Modified Seldinger Technique with placement of a 5 French sheath in the artery and 7 French sheath in the vein.  The sheaths was aspirated and flushed.    Right Heart Catheterization A 7 French Swan-Ganz catheter was advanced with balloon inflated on the sheath under fluoroscopic guidance into the pulmonary artery to pulmonary artery wedge position. After hemodynamics were completed, samples were taken for PA SaO2% measurement to be used in Fick Cardiac Output/Index catheterization. Then 3 saline injections the proximal probe were performed for calculating Thermodilution Cardiac Output/Index. The catheters and pullback into the main Pulmonary Artery, Right Ventricle, and Right Atrium were hemodynamics and oxygen saturations were measured. The catheter was then pulled back the balloon down and then completely out of the body.  Left Heart Catheterization Would the Swan-Ganz catheter still in the PA position a 5 Fr angled Pigtail was advanced over a standard J-wire into the ascending aorta and used to cross the aortic valve for measurement Left Ventricular Hemodynamics. Left ventriculography was then performed in the RAO projection. Hemodynamics were then resampled and the catheter pulled back across the aortic valve for measurement of "pullback" gradient. Simultaneous left ventricular and right ventricular pressures were measured  as per simultaneous pulmonary arterial and central arterial oxygen saturations. This was done  on and off oxygen.  Next a JL4 followed by JR 4 catheter was exchanged over standard J-wire ind used to engage first the Left then Right Coronary Artery.  Multiple cineangiographic views of the Left then Right Coronary Artery system(s) were performed.   The JR 4 catheter was then removed the body over wire. All exchanges were made over standard J wire.   The patient was transferred to the holding area where the sheath was removed with manual pressure held for hemostasis.    The patient was transported to the PACU holding area in chest pain free, hemodynamically stable condition.   The patient  was stable before, during and following the procedure.   Patient did tolerate procedure well. There were complications. EBL: 20 mL  Medications:  Premedication: 5 mg  Valium,  Sedation:  2 mg IV Versed, 25 IV mcg Fentanyl  Contrast:  75 mm Omnipaque  Intracoronary Nitroglycerin 200 mcg x2  Hemodynamics:   SaO2% On 2 L [place]  Pressures          mean pressure/EDP        Right Atrium  58   16/14 mmHg   11 mmHg --mean       Right Ventricle  52   53/11 mmHg   13 mmHg -- EDP       Pulmonary Artery  52   46/22 mmHg   32 mmHg -- mean       PCWP  24/23 mmHg 21 mmHg - mean   Central Aortic  92  104/56 mmHg   74 mmHg   Left Ventricular    106/17 mmHg   21 mmHg         Cardiac Output:  Cardiac Index:       Fick 5.48  2.99      Thermodilution  3.94    2.15     Left Ventriculography:  EF:    20-25%  Wall Motion: Global hypokinesis worse in the anterior and inferior walls  Coronary Angiographic Data:  Left Main:   Moderate size vessel bifurcating into LAD and circumflex, diffusely calcified but minimal stenosis. Left Anterior Descending (LAD):  moderate caliber vessel with a ostial 30% stenosis followed by proximal and a median 90% stenosis at the first diagonal branch is very small and 89% ostial stenosis of.  The LAD then has a short segment of relatively normal vessel followed by 2 tandem 70% lesions at the the takeoff of another small diagonal branch that is diffusely diseased. Then again the vessel normalizes somewhat before yet another diagonal branch also diffusely diseased at this point the vessel tapers into an near 99% hinge point before the vessel then continues on is a small less than 1.5 mm vessel that wraps the apex. There extensive septal perforators including a large septal trunk just prior to the tandem 70% lesions. These provide collaterals to the Right Posterior Ascending Artery.  Circumflex (LCx):  moderate caliber vessel with mild proximal disease followed by tandem 70-80 0% stenosis in the mid AV groove after giving rise to small AV groove branch. The follow on main circumflex terminates as a significant bifurcating lateral Obtuse Marginal Branch . The proximal portions lateral OM vessel is diffusely diseased 70% proximal followed by a 90% stenosis just prior to the major bifurcation into OM 1 OM 2 which are both small caliber less than 1.5 mm vessels coursing along the inferolateral wall.  Right Coronary Artery: moderate sized vessel with several moderate 40-60% lesions in the proximal  vessel followed by 100% mid occlusion in the distal mid vessel. There are bridging collaterals to the distal vessel but reconstitutes prior to branching into the PDA and posterior lateral system there is diffusely diseased. There is a relatively large RV marginal branch is also diffusely diseased. This gives collaterals to the distal RCA as well.   The RPDA as well as posterior lateral system are perfused from left to right collaterals as described above.   Impression:  Severe Ischemic Cardiomyopathy with 100% mid occlusion of the RCA and subtotal occlusion of both the circumflex and the LAD with 95+ % lesions with multiple other significant lesions in his vessels well. None of the vessels are greater than  2-2.5 mm in diameter and did not present as very acceptable for PCI due to the extent of lesions.  Moderately elevated LVEDP that correlates with the pulmonary capillary wedge pressure and right ventricular end-diastolic pressures. This would indicate that she is yet to be adequately diuresis/afterload reduced.  Plan:  Transfer care to [facility] service.  Cardiac MRI to evaluate for viability. If there is viable territory would recommend CT surgical consultation; however, she does have multiple ongoing comorbidities which would need to be addressed prior to surgery including infection, bilateral DVTs and continued heart failure therapy.  Continue to titrate cardiac medications and continue diuresis.   I will reinitiate Coumadin therapy for his with IV heparin as she is unlikely to be a candidate for surgery surgical intervention in the near future.  The case and results was discussed with the patient (and family).  Time Spend Directly with Patient:   45  minutes  Geral Tuch W, M.D., M.S. [facility] [address] [place], [place]  [address]  [phone]  07/07/2012 10:08 AM

## 2012-07-07 NOTE — Progress Notes (Signed)
Pt's daughter Oletha Blend  made aware that pt will keep her updated what the doctors say.  Daughter verbalized understanding. Amanda Pea, Charity fundraiser.

## 2012-07-08 LAB — GLUCOSE, CAPILLARY
Glucose-Capillary: 141 mg/dL — ABNORMAL HIGH (ref 70–99)
Glucose-Capillary: 207 mg/dL — ABNORMAL HIGH (ref 70–99)

## 2012-07-08 LAB — CBC
HCT: 28.9 % — ABNORMAL LOW (ref 36.0–46.0)
Hemoglobin: 9.1 g/dL — ABNORMAL LOW (ref 12.0–15.0)
RBC: 3.58 MIL/uL — ABNORMAL LOW (ref 3.87–5.11)
RDW: 15.6 % — ABNORMAL HIGH (ref 11.5–15.5)
WBC: 10.3 10*3/uL (ref 4.0–10.5)

## 2012-07-08 LAB — LUPUS ANTICOAGULANT PANEL
DRVVT: 39.3 secs (ref <42.9)
Lupus Anticoagulant: DETECTED — AB
PTT Lupus Anticoagulant: 200 secs — ABNORMAL HIGH (ref 28.0–43.0)
PTTLA 4:1 Mix: 156.5 secs — ABNORMAL HIGH (ref 28.0–43.0)
PTTLA Confirmation: 13.6 secs — ABNORMAL HIGH (ref <8.0)

## 2012-07-08 LAB — FACTOR 5 LEIDEN

## 2012-07-08 LAB — CARDIOLIPIN ANTIBODIES, IGG, IGM, IGA
Anticardiolipin IgA: 5 APL U/mL — ABNORMAL LOW (ref <22)
Anticardiolipin IgG: 4 GPL U/mL — ABNORMAL LOW (ref <23)
Anticardiolipin IgM: 0 MPL U/mL — ABNORMAL LOW (ref <11)

## 2012-07-08 LAB — PROTIME-INR: INR: 1.71 — ABNORMAL HIGH (ref 0.00–1.49)

## 2012-07-08 MED ORDER — WARFARIN SODIUM 5 MG PO TABS
5.0000 mg | ORAL_TABLET | Freq: Once | ORAL | Status: AC
  Administered 2012-07-08: 5 mg via ORAL
  Filled 2012-07-08: qty 1

## 2012-07-08 MED ORDER — PANTOPRAZOLE SODIUM 40 MG PO TBEC
40.0000 mg | DELAYED_RELEASE_TABLET | Freq: Every day | ORAL | Status: DC
  Administered 2012-07-08 – 2012-07-09 (×2): 40 mg via ORAL
  Filled 2012-07-08 (×2): qty 1

## 2012-07-08 NOTE — Progress Notes (Signed)
ANTICOAGULATION CONSULT NOTE - Follow Up Consult  Pharmacy Consult for Heparin/Coumadin Indication: DVT  Allergies  Allergen Reactions  . Penicillins Anaphylaxis  . Sulfa Antibiotics Anaphylaxis    Patient Measurements: Height: 5\' 6"  (167.6 cm) Weight: 161 lb 11.2 oz (73.347 kg) (scale b) IBW/kg (Calculated) : 59.3   Vital Signs: Temp: 99.4 F (37.4 C) (11/15 0539) Temp src: Oral (11/15 0539) BP: 91/55 mmHg (11/15 1058) Pulse Rate: 82  (11/15 1058)  Labs:  [facility] 07/08/12 0530 07/07/12 0600 07/06/12 0820  HGB 9.1* 9.4* --  HCT 28.9* 29.4* 31.1*  PLT 308 301 283  APTT -- -- --  LABPROT 19.5* 17.9* 17.5*  INR 1.71* 1.52* 1.48  HEPARINUNFRC -- 0.83* 0.30  CREATININE -- -- --  CKTOTAL -- -- --  CKMB -- -- --  TROPONINI -- -- --    Estimated Creatinine Clearance: 49.5 ml/min (by C-G formula based on Cr of 1.16).  Assessment:   INR = 1.71, increasing toward goal 2-21 in this 66 year old female resumed on Lovenox/coumadin s/p cath for DVT in right posterior tibial vein and Left: DVT noted profunda vein.   Switched from IV heparin drip to SQ Lovenox 70mg  q12h yesterday. CrCl ~ 49 ml/min. CBC stable, consistent with anemia post cath. Plts wnl. No bleeding noted. No chest pain or SOB per cardiologist's assessment this AM. Cardiology PA-C notes that pt unable to afford home enoxaparin thus plan to keep patient here until INR>2.    Goal of Therapy:  INR 2-3 Monitor platelets by anticoagulation protocol: Yes   Plan:  Coumadin 5 mg po x 1 dose tonight   Thank you,  Noah Delaine, RPh Clinical Pharmacist Pager: [phone] 07/08/2012 11:15 AM

## 2012-07-08 NOTE — Progress Notes (Signed)
Subjective:  No chest pain or SOB. Understandably anxious about CABG and concerned about finances.  Objective:  Vital Signs in the last 24 hours: Temp:  [97.6 F (36.4 C)-99.4 F (37.4 C)] 99.4 F (37.4 C) (11/15 0539) Pulse Rate:  [83-94] 90  (11/15 0738) Resp:  [20] 20  (11/15 0539) BP: (90-103)/(56-63) 95/63 mmHg (11/15 0738) SpO2:  [94 %-97 %] 94 % (11/15 0539) Weight:  [73.347 kg (161 lb 11.2 oz)] 73.347 kg (161 lb 11.2 oz) (11/15 0539)  Intake/Output from previous day:  Intake/Output Summary (Last 24 hours) at 07/08/12 0907 Last data filed at 07/07/12 2300  Gross per 24 hour  Intake    600 ml  Output      0 ml  Net    600 ml    Physical Exam: General appearance: alert, cooperative and no distress Lungs: clear to auscultation bilaterally Heart: regular rate and rhythm Rt groin without hematoma   Rate: 88  Rhythm: normal sinus rhythm  Lab Results:  Basename 07/08/12 0530 07/07/12 0600  WBC 10.3 11.6*  HGB 9.1* 9.4*  PLT 308 301   No results found for this basename: NA:2,K:2,CL:2,CO2:2,GLUCOSE:2,BUN:2,CREATININE:2 in the last 72 hours No results found for this basename: TROPONINI:2,CK,MB:2 in the last 72 hours Hepatic Function Panel No results found for this basename: PROT,ALBUMIN,AST,ALT,ALKPHOS,BILITOT,BILIDIR,IBILI in the last 72 hours No results found for this basename: CHOL in the last 72 hours  Basename 07/08/12 0530  INR 1.71*    Imaging: Imaging results have been reviewed  Cardiac Studies:  Assessment/Plan:   Principal Problem:  *Acute systolic CHF (congestive heart failure) Active Problems:  Ischemic cardiomyopathy, EF 25%  CAD (coronary artery disease), cath 07/05/12  Pedal edema  Cellulitis  DVT Oct 2013  CKD (chronic kidney disease) stage 3, GFR 30-59 ml/min  Popliteal artery thrombosis, right  LBBB (left bundle branch block)  DM (diabetes mellitus)  Plan- Her Hgb has dropped to 9, will check stools, she has a remote history of  PUD. Possible discharge later today on Lovenox to Coumadin. . Will ask financial counselor to see. Add PPI.  Corine Shelter PA-C 07/08/2012, 9:07 AM  I have seen and examined the patient along with Corine Shelter PA-C.  I have reviewed the chart, notes and new data.  I agree with PA's note.  Unable to afford home enoxaparin. Keep here until INR>2. She has not been cooperative with PT so far. I have told her that compliance with rehab will be critical now and after CABG. D/w Dr. Donata Clay. Plan at least 1 month delay until surgery.  Thurmon Fair, MD, [facility] [phone] 07/08/2012, 10:46 AM

## 2012-07-08 NOTE — Progress Notes (Signed)
Inpatient Diabetes Program Recommendations  AACE/ADA: New Consensus Statement on Inpatient Glycemic Control (2013)  Target Ranges:  Prepandial:   less than 140 mg/dL      Peak postprandial:   less than 180 mg/dL (1-2 hours)      Critically ill patients:  140 - 180 mg/dL   Results for [name] [name], [name] [name] (MRN [id number]) as of 07/08/2012 14:06  Ref. Range 07/07/2012 05:44 07/07/2012 11:11 07/07/2012 16:10 07/07/2012 21:02 07/08/2012 06:52 07/08/2012 10:59  Glucose-Capillary Latest Range: 70-99 mg/dL 409 (H) 811 (H) 914 (H) 278 (H) 141 (H) 225 (H)    Inpatient Diabetes Program Recommendations Insulin - Basal: Please consider increasing Lantus to 20 units QHS. Correction (SSI): Please consider increasing Novolog correction to moderate scale and adding HS coverage.  Patient's bedtime CBG 07/07/12 was 278 mg/dl and her fasting CBG on 07/18/12 was 308 mg/dl.  Note: Will continue to follow.  Thanks, Orlando Penner, RN, BSN, CCRN Diabetes Coordinator Inpatient Diabetes Program [phone]

## 2012-07-08 NOTE — Progress Notes (Signed)
Came to evaluate ambulation, assist in preparing for upcoming CABG and educate. Pt very opposed to ambulating, sts she has a bad right foot that had too much stress yesterday and that she is going to have to rest it today. Attempted to discuss CR Phase I role in process. Pt opposed to discussing, stating that she just can't handle that information right now. Brought pt IS to work on pre-op however pt refused ed on how to use it. Stated she appreciated receiving IS but she would have someone from home teach her how to use it. Very anxious and overwhelmed. Seemed to be stating at the same time that no one was helping her but that she did not want help (ie education, discussion, ambulation). Encouraged pt to inform her nurse to page [place] if she changed her mind and wanted to work with [employer] services. [phone] Ethelda Chick CES, ACSM

## 2012-07-09 DIAGNOSIS — N390 Urinary tract infection, site not specified: Secondary | ICD-10-CM | POA: Diagnosis present

## 2012-07-09 LAB — GLUCOSE, CAPILLARY
Glucose-Capillary: 136 mg/dL — ABNORMAL HIGH (ref 70–99)
Glucose-Capillary: 185 mg/dL — ABNORMAL HIGH (ref 70–99)

## 2012-07-09 LAB — CBC
HCT: 29.4 % — ABNORMAL LOW (ref 36.0–46.0)
Hemoglobin: 9.2 g/dL — ABNORMAL LOW (ref 12.0–15.0)
MCHC: 31.3 g/dL (ref 30.0–36.0)

## 2012-07-09 LAB — PROTIME-INR: INR: 1.93 — ABNORMAL HIGH (ref 0.00–1.49)

## 2012-07-09 MED ORDER — INSULIN ASPART 100 UNIT/ML ~~LOC~~ SOLN: 0.0000 [IU] | Freq: Every day | SUBCUTANEOUS | Status: DC

## 2012-07-09 MED ORDER — ATORVASTATIN CALCIUM 10 MG PO TABS: 10.0000 mg | ORAL_TABLET | Freq: Every day | ORAL | Status: DC

## 2012-07-09 MED ORDER — ENSURE COMPLETE PO LIQD: 237.0000 mL | Freq: Two times a day (BID) | ORAL | Status: DC

## 2012-07-09 MED ORDER — METOPROLOL SUCCINATE ER 25 MG PO TB24: 25.0000 mg | ORAL_TABLET | Freq: Every day | ORAL | Status: DC

## 2012-07-09 MED ORDER — SPIRONOLACTONE 12.5 MG HALF TABLET: 12.5000 mg | ORAL_TABLET | Freq: Every day | ORAL | Status: DC

## 2012-07-09 MED ORDER — INSULIN ASPART 100 UNIT/ML ~~LOC~~ SOLN: 0.0000 [IU] | Freq: Three times a day (TID) | SUBCUTANEOUS | Status: DC

## 2012-07-09 MED ORDER — PANTOPRAZOLE SODIUM 40 MG PO TBEC: 40.0000 mg | DELAYED_RELEASE_TABLET | Freq: Every day | ORAL | Status: DC

## 2012-07-09 MED ORDER — INSULIN GLARGINE 100 UNIT/ML ~~LOC~~ SOLN: 20.0000 [IU] | Freq: Every day | SUBCUTANEOUS | Status: DC

## 2012-07-09 MED ORDER — ENOXAPARIN SODIUM 150 MG/ML ~~LOC~~ SOLN: 1.0000 mg/kg | Freq: Two times a day (BID) | SUBCUTANEOUS | Status: DC

## 2012-07-09 MED ORDER — METOPROLOL SUCCINATE ER 25 MG PO TB24
25.0000 mg | ORAL_TABLET | Freq: Every day | ORAL | Status: DC
  Administered 2012-07-09: 25 mg via ORAL
  Filled 2012-07-09: qty 1

## 2012-07-09 MED ORDER — METFORMIN HCL 500 MG PO TABS: 500.0000 mg | ORAL_TABLET | Freq: Two times a day (BID) | ORAL | Status: DC

## 2012-07-09 MED ORDER — LIVING WELL WITH DIABETES BOOK: 1.0000 | Freq: Once | Status: DC

## 2012-07-09 MED ORDER — METFORMIN HCL 500 MG PO TABS
500.0000 mg | ORAL_TABLET | Freq: Two times a day (BID) | ORAL | Status: DC
  Filled 2012-07-09 (×2): qty 1

## 2012-07-09 MED ORDER — ASPIRIN 81 MG PO TBEC: 81.0000 mg | DELAYED_RELEASE_TABLET | Freq: Every day | ORAL | Status: AC

## 2012-07-09 MED ORDER — FUROSEMIDE 10 MG/ML IJ SOLN: 40.0000 mg | Freq: Every day | INTRAMUSCULAR | Status: DC

## 2012-07-09 MED ORDER — FUROSEMIDE 40 MG PO TABS: 40.0000 mg | ORAL_TABLET | Freq: Every day | ORAL | Status: DC

## 2012-07-09 MED ORDER — LORAZEPAM 0.5 MG PO TABS: 0.2500 mg | ORAL_TABLET | Freq: Two times a day (BID) | ORAL | Status: DC | PRN

## 2012-07-09 MED ORDER — WARFARIN SODIUM 5 MG PO TABS
5.0000 mg | ORAL_TABLET | Freq: Every day | ORAL | Status: DC
  Filled 2012-07-09: qty 1

## 2012-07-09 MED ORDER — ACETAMINOPHEN 325 MG PO TABS: 650.0000 mg | ORAL_TABLET | ORAL | Status: DC | PRN

## 2012-07-09 MED ORDER — WARFARIN SODIUM 5 MG PO TABS: 5.0000 mg | ORAL_TABLET | Freq: Every day | ORAL | Status: DC

## 2012-07-09 NOTE — Progress Notes (Signed)
Patent demonstrated drawing up and giving injection of Lovenox as per order . Three days supply   of lovanox given to patient as per MD order.

## 2012-07-09 NOTE — Progress Notes (Signed)
Subjective:  No complaints  Objective:  Vital Signs in the last 24 hours: Temp:  [98.6 F (37 C)-98.9 F (37.2 C)] 98.7 F (37.1 C) (11/16 0522) Pulse Rate:  [81-83] 81  (11/16 0522) Resp:  [18-20] 18  (11/16 0522) BP: (89-98)/(51-55) 96/53 mmHg (11/16 0522) SpO2:  [94 %-97 %] 97 % (11/16 0522) Weight:  [74 kg (163 lb 2.3 oz)] 74 kg (163 lb 2.3 oz) (11/16 0522)  Intake/Output from previous day:  Intake/Output Summary (Last 24 hours) at 07/09/12 0923 Last data filed at 07/09/12 0853  Gross per 24 hour  Intake    960 ml  Output   1650 ml  Net   -690 ml    Physical Exam: General appearance: alert, cooperative and no distress Lungs: clear to auscultation bilaterally Heart: regular rate and rhythm   Rate: 80  Rhythm: normal sinus rhythm  Lab Results:  Basename 07/09/12 0519 07/08/12 0530  WBC 9.6 10.3  HGB 9.2* 9.1*  PLT 321 308   No results found for this basename: NA:2,K:2,CL:2,CO2:2,GLUCOSE:2,BUN:2,CREATININE:2 in the last 72 hours No results found for this basename: TROPONINI:2,CK,MB:2 in the last 72 hours Hepatic Function Panel No results found for this basename: PROT,ALBUMIN,AST,ALT,ALKPHOS,BILITOT,BILIDIR,IBILI in the last 72 hours No results found for this basename: CHOL in the last 72 hours  Basename 07/09/12 0519  INR 1.93*    Imaging: Imaging results have been reviewed  Cardiac Studies:  Assessment/Plan:   Principal Problem:  *Acute systolic CHF (congestive heart failure) Active Problems:  Ischemic cardiomyopathy, EF 25%  CAD (coronary artery disease), cath 07/05/12  Pedal edema  Cellulitis  DVT Oct 2013  CKD (chronic kidney disease) stage 3, GFR 30-59 ml/min  Popliteal artery thrombosis, right  LBBB (left bundle branch block)  DM (diabetes mellitus)  Plan-Will discuss with MD, ? Discharge today or in AM. B/P is soft, she is not on ACE secondary to transient renal insufficiency and now hypotension. Decrease Lasix to 40mg  daily and decrease  Aldactone to 12.5mg  daily. Will add Metformin and increase Lantus as suggested by Diabetic RN.     Corine Shelter PA-C 07/09/2012, 9:23 AM

## 2012-07-09 NOTE — Progress Notes (Signed)
ANTICOAGULATION CONSULT NOTE - Follow Up Consult  Pharmacy Consult for LMWH/Coumadin Indication: DVT  Allergies  Allergen Reactions  . Penicillins Anaphylaxis  . Sulfa Antibiotics Anaphylaxis    Patient Measurements: Height: 5\' 6"  (167.6 cm) Weight: 163 lb 2.3 oz (74 kg) (scale B) IBW/kg (Calculated) : 59.3   Vital Signs: Temp: 98.7 F (37.1 C) (11/16 0522) Temp src: Oral (11/16 0522) BP: 96/53 mmHg (11/16 0522) Pulse Rate: 81  (11/16 0522)  Labs:  Basename 07/09/12 0519 07/08/12 0530 07/07/12 0600  HGB 9.2* 9.1* --  HCT 29.4* 28.9* 29.4*  PLT 321 308 301  APTT -- -- --  LABPROT 21.3* 19.5* 17.9*  INR 1.93* 1.71* 1.52*  HEPARINUNFRC -- -- 0.83*  CREATININE -- -- --  CKTOTAL -- -- --  CKMB -- -- --  TROPONINI -- -- --    Estimated Creatinine Clearance: 49.8 ml/min (by C-G formula based on Cr of 1.16).  Assessment:   66 year old female resumed on Lovenox/coumadin s/p cath for DVT diagnosed 06/22/12.  She was switched from IV heparin drip to SQ Lovenox 11/14. Her INR Is 1.93 today. after 4-5-5-5 mg doses of coumadin post cath. Her INR is almost therapeutic.  CBC stable, no bleeding reported.  Possible home today with LMWH bridge therapy.   Goal of Therapy:  INR 2-3 Monitor platelets by anticoagulation protocol: Yes   Plan:  1. Cont LMWH 75 mg sq q12h until INR > = 2 2. Coumadin 5 mg po daily at 1800 Herby Abraham, Pharm.D. [phone] 07/09/2012 12:17 PM

## 2012-07-09 NOTE — Progress Notes (Signed)
Pt. Seen and examined. Agree with the NP/PA-C note as written.  No acute complaints, INR is close to therapeutic. Will need lovenox through tomorrow for overlap. Rx for 3 more doses. Should be able to go later today with lovenox if we can arrange a supply from inpatient pharmacy due to cost (which would be less than another day of hospitalization). Mild hypotension, change carvedilol to toprol XL 25 mg daily, reduce diuretics.   Chrystie Nose, MD, [facility] Attending Cardiologist [facility]

## 2012-07-09 NOTE — Progress Notes (Signed)
   CARE MANAGEMENT NOTE 07/09/2012  Patient:  [name] [name], [name] [name]   Account Number:  [id number]  Date Initiated:  06/22/2012  Documentation initiated by:  [facility]  Subjective/Objective Assessment:   ADMITTED W/LEG SWELLING.PLEURAL EFFUSION.     Action/Plan:   FROM HOME ALONE.   Anticipated DC Date:  07/05/2012   Anticipated DC Plan:  ACUTE TO ACUTE TRANS      DC Planning Services  CM consult  Medication Assistance      Choice offered to / List presented to:             Status of service:  Completed, signed off Medicare Important Message given?   (If response is "NO", the following Medicare IM given date fields will be blank) Date Medicare IM given:   Date Additional Medicare IM given:    Discharge Disposition:  HOME/SELF CARE  Per UR Regulation:  Reviewed for med. necessity/level of care/duration of stay  If discussed at Long Length of Stay Meetings, dates discussed:   06/28/2012  06/30/2012  07/05/2012    Comments:  07/09/2012 1100 NCM spoke to pt and states she has no drug coverage. Requested assistance with Lovenox. Pt will need three additional days on med. Rx to main pharmacy for Lovenox from indigent funds. Isidoro Donning RN CCM Case Mgmt phone [phone]  07/05/12 KATHY MAHABIR RN,BSN NCM [phone] HEART CATH  TODAY.   07/04/12 KATHY MAHABIR RN,BSN NCM [phone] FOR HEART CATH ONCE INR LESS 1.7.DOES NOT HAVE SCRIPT COVERAGE,BUT HER PHARMACY PROVIDES DISCOUNTS ON HER MEDS THAT IS AFFORABLE & SHE PREFERS TO USE.DECLINES INDIGENT FUNDS.PROVIDED HER W/[employer] OF DEPT SOCIAL SERVICES FOR MEDICAID.  06/30/12 KATHY MAHABIR RN,BSN NCM [phone] CARDIO FOLLOWING-CHF.DVT,DM,PVD,UTI.WILL CHECK BENEFIT FOR SCRIPT COVERAGE.  06/28/12 KATHY MAHABIR RN,BSN NCM [phone] PER MD-ELEVATED HGB AIC,DM,DM EDUC CONS.WILL AWAIT RECOMMENDATIONS & FOLLOW FOR PROGRESS/D/C PLANS. 06/27/12 KATHY MAHABIR RN,BSN NCM [phone] HAS OWN RESOURCES IN PLACE FOR SCRIPTS W/CURRENT  PHARMACY.  06/22/12 KATHY MAHABIR RN,BSN NCM [phone] QUALIFIES FOR INDIGENT FUNDS IF NEEDED.PROVIDED W/COMMUNITY RESOURCES/[employer]/TARGET $4 MED LIST.

## 2012-07-11 ENCOUNTER — Emergency Department (HOSPITAL_COMMUNITY): Payer: Medicare Other

## 2012-07-11 ENCOUNTER — Inpatient Hospital Stay (HOSPITAL_COMMUNITY)
Admission: EM | Admit: 2012-07-11 | Discharge: 2012-07-25 | Disposition: A | Discharge status: Home / Self Care | Payer: Medicare Other | Source: Home / Self Care | Attending: Cardiovascular Disease | Admitting: Cardiovascular Disease

## 2012-07-11 ENCOUNTER — Encounter (HOSPITAL_COMMUNITY): Payer: Self-pay | Admitting: Emergency Medicine

## 2012-07-11 DIAGNOSIS — Z90710 Acquired absence of both cervix and uterus: Secondary | ICD-10-CM

## 2012-07-11 DIAGNOSIS — I509 Heart failure, unspecified: Secondary | ICD-10-CM

## 2012-07-11 DIAGNOSIS — N183 Chronic kidney disease, stage 3 unspecified: Secondary | ICD-10-CM | POA: Diagnosis present

## 2012-07-11 DIAGNOSIS — Z882 Allergy status to sulfonamides status: Secondary | ICD-10-CM

## 2012-07-11 DIAGNOSIS — I428 Other cardiomyopathies: Secondary | ICD-10-CM | POA: Diagnosis present

## 2012-07-11 DIAGNOSIS — I480 Paroxysmal atrial fibrillation: Secondary | ICD-10-CM | POA: Diagnosis present

## 2012-07-11 DIAGNOSIS — E119 Type 2 diabetes mellitus without complications: Secondary | ICD-10-CM

## 2012-07-11 DIAGNOSIS — I82409 Acute embolism and thrombosis of unspecified deep veins of unspecified lower extremity: Secondary | ICD-10-CM | POA: Diagnosis present

## 2012-07-11 DIAGNOSIS — I447 Left bundle-branch block, unspecified: Secondary | ICD-10-CM | POA: Diagnosis present

## 2012-07-11 DIAGNOSIS — Z7901 Long term (current) use of anticoagulants: Secondary | ICD-10-CM

## 2012-07-11 DIAGNOSIS — L02619 Cutaneous abscess of unspecified foot: Secondary | ICD-10-CM | POA: Diagnosis present

## 2012-07-11 DIAGNOSIS — I5043 Acute on chronic combined systolic (congestive) and diastolic (congestive) heart failure: Secondary | ICD-10-CM | POA: Diagnosis present

## 2012-07-11 DIAGNOSIS — I2510 Atherosclerotic heart disease of native coronary artery without angina pectoris: Secondary | ICD-10-CM | POA: Diagnosis present

## 2012-07-11 DIAGNOSIS — D649 Anemia, unspecified: Secondary | ICD-10-CM | POA: Diagnosis not present

## 2012-07-11 DIAGNOSIS — E875 Hyperkalemia: Secondary | ICD-10-CM | POA: Diagnosis not present

## 2012-07-11 DIAGNOSIS — Z794 Long term (current) use of insulin: Secondary | ICD-10-CM

## 2012-07-11 DIAGNOSIS — Z880 Allergy status to penicillin: Secondary | ICD-10-CM

## 2012-07-11 DIAGNOSIS — N289 Disorder of kidney and ureter, unspecified: Secondary | ICD-10-CM

## 2012-07-11 DIAGNOSIS — R112 Nausea with vomiting, unspecified: Secondary | ICD-10-CM | POA: Diagnosis not present

## 2012-07-11 DIAGNOSIS — IMO0001 Reserved for inherently not codable concepts without codable children: Secondary | ICD-10-CM | POA: Diagnosis present

## 2012-07-11 DIAGNOSIS — I959 Hypotension, unspecified: Secondary | ICD-10-CM | POA: Diagnosis not present

## 2012-07-11 DIAGNOSIS — I4891 Unspecified atrial fibrillation: Secondary | ICD-10-CM | POA: Diagnosis present

## 2012-07-11 DIAGNOSIS — L03119 Cellulitis of unspecified part of limb: Secondary | ICD-10-CM | POA: Diagnosis present

## 2012-07-11 DIAGNOSIS — I5021 Acute systolic (congestive) heart failure: Secondary | ICD-10-CM | POA: Diagnosis present

## 2012-07-11 DIAGNOSIS — I255 Ischemic cardiomyopathy: Secondary | ICD-10-CM | POA: Diagnosis present

## 2012-07-11 DIAGNOSIS — I2589 Other forms of chronic ischemic heart disease: Secondary | ICD-10-CM | POA: Diagnosis present

## 2012-07-11 DIAGNOSIS — Z8249 Family history of ischemic heart disease and other diseases of the circulatory system: Secondary | ICD-10-CM

## 2012-07-11 DIAGNOSIS — E210 Primary hyperparathyroidism: Secondary | ICD-10-CM | POA: Diagnosis present

## 2012-07-11 DIAGNOSIS — Z86718 Personal history of other venous thrombosis and embolism: Secondary | ICD-10-CM

## 2012-07-11 DIAGNOSIS — D638 Anemia in other chronic diseases classified elsewhere: Secondary | ICD-10-CM | POA: Diagnosis present

## 2012-07-11 DIAGNOSIS — I743 Embolism and thrombosis of arteries of the lower extremities: Secondary | ICD-10-CM | POA: Diagnosis present

## 2012-07-11 DIAGNOSIS — E860 Dehydration: Secondary | ICD-10-CM | POA: Diagnosis not present

## 2012-07-11 HISTORY — DX: Type 2 diabetes mellitus with hyperglycemia: E11.65

## 2012-07-11 HISTORY — DX: Acute embolism and thrombosis of unspecified deep veins of lower extremity, bilateral: I82.403

## 2012-07-11 HISTORY — DX: Dilated cardiomyopathy: I42.0

## 2012-07-11 HISTORY — DX: Atherosclerotic heart disease of native coronary artery without angina pectoris: I25.10

## 2012-07-11 HISTORY — DX: Unspecified atherosclerosis of native arteries of extremities, right leg: I70.201

## 2012-07-11 HISTORY — DX: Type 2 diabetes mellitus with unspecified complications: E11.8

## 2012-07-11 HISTORY — DX: Ischemic cardiomyopathy: I25.5

## 2012-07-11 LAB — CBC WITH DIFFERENTIAL/PLATELET
Basophils Absolute: 0 10*3/uL (ref 0.0–0.1)
Eosinophils Relative: 2 % (ref 0–5)
Lymphocytes Relative: 21 % (ref 12–46)
MCV: 80.1 fL (ref 78.0–100.0)
Neutrophils Relative %: 68 % (ref 43–77)
Platelets: 386 10*3/uL (ref 150–400)
RDW: 15.6 % — ABNORMAL HIGH (ref 11.5–15.5)
WBC: 8.9 10*3/uL (ref 4.0–10.5)

## 2012-07-11 LAB — BASIC METABOLIC PANEL
CO2: 26 mEq/L (ref 19–32)
Calcium: 11.3 mg/dL — ABNORMAL HIGH (ref 8.4–10.5)
GFR calc non Af Amer: 48 mL/min — ABNORMAL LOW (ref >90)
Potassium: 4.2 mEq/L (ref 3.5–5.1)
Sodium: 133 mEq/L — ABNORMAL LOW (ref 135–145)

## 2012-07-11 LAB — PROTIME-INR
INR: 2.54 — ABNORMAL HIGH (ref 0.00–1.49)
Prothrombin Time: 26.1 seconds — ABNORMAL HIGH (ref 11.6–15.2)

## 2012-07-11 LAB — PRO B NATRIURETIC PEPTIDE: Pro B Natriuretic peptide (BNP): 10853 pg/mL — ABNORMAL HIGH (ref 0–125)

## 2012-07-11 LAB — APTT: aPTT: 59 seconds — ABNORMAL HIGH (ref 24–37)

## 2012-07-11 LAB — GLUCOSE, CAPILLARY: Glucose-Capillary: 179 mg/dL — ABNORMAL HIGH (ref 70–99)

## 2012-07-11 LAB — POCT I-STAT TROPONIN I

## 2012-07-11 MED ORDER — SODIUM CHLORIDE 0.9 % IV BOLUS (SEPSIS)
500.0000 mL | Freq: Once | INTRAVENOUS | Status: AC
  Administered 2012-07-11: 500 mL via INTRAVENOUS

## 2012-07-11 NOTE — ED Notes (Signed)
Pt states that she has not had a BM in 2 days and wants something to help her "go" provider aware.

## 2012-07-11 NOTE — ED Notes (Signed)
Per EMS - pt reports she was admitted here 3 weeks ago for DVT and cellulitis, d/c on Saturday. This afternoon pt started having nausea and dizziness. Pt reports only time she isn't dizzy is when she is laying down. Pt denies pain. BP 120/80 HR 80 RR 18 CBG 198.

## 2012-07-11 NOTE — ED Notes (Signed)
Pt c/o pain, redness, and swelling to right calf, swelling to left calf, dizziness and sob when standing. Pt A&Ox4, ambulatory with standby assist.

## 2012-07-11 NOTE — ED Provider Notes (Signed)
History     CSN: [id number]  Arrival date & time 07/11/12  1906   First MD Initiated Contact with Patient 07/11/12 1910      Chief Complaint  Patient presents with  . Dizziness  . Nausea    (Consider location/radiation/quality/duration/timing/severity/associated sxs/prior treatment) HPI Comments: Patient presents today with a chief complaint of SOB, dizziness, and nausea.  She reports that her symptoms started earlier today.  She describes the dizziness as a feeling of lightheadedness.  She reports that she is SOB at rest, but shortness of breath increases with exertion.  She denies chest pain.  She is nauseous, but no vomiting or diarrhea.  No abdominal pain.  No syncope.  No fever or chills.  She was discharged from [facility] hospital 2 days ago.  She reports that she was in the hospital for management of a Cellulitis, bilateral DVT, and pleural effusion.  She was evaluated by Cardiology during her hospitalization.  She had a Cardiac Cath done on 06/23/12, which showed Severe Ischemic Cardiomyopathy with 100% mid occlusion of the RCA and subtotal occlusion of both the circumflex and the LAD with 95+ % lesions with multiple other significant lesions in his vessels well.  It was felt that patient would need CABG in the future, but the rest of her medical problems needed to be managed first.  She does not have a PCP.  The history is provided by the patient.    Past Medical History  Diagnosis Date  . Cardiomyopathy, EF 25% 06/29/2012  . Type II diabetes mellitus     Past Surgical History  Procedure Date  . Vaginal hysterectomy ~ 1980  . Cesarean section 1979  . Lumbar disc surgery 1980's  . Facial reconstruction surgery     Family History  Problem Relation Age of Onset  . Cancer Mother   . CAD Mother   . Cancer Father   . CAD Father     History  Substance Use Topics  . Smoking status: Former Smoker -- 0.5 packs/day for 20 years    Types: Cigarettes  . Smokeless tobacco: Never  Used     Comment: 07/06/12 "stopped smoking cigarettes years ago"  . Alcohol Use: No    OB History    Grav Para Term Preterm Abortions TAB SAB Ect Mult Living                  Review of Systems  Constitutional: Negative for fever and chills.  HENT: Negative for neck pain and neck stiffness.   Eyes: Negative for visual disturbance.  Respiratory: Positive for shortness of breath.   Cardiovascular: Negative for chest pain.  Gastrointestinal: Positive for nausea. Negative for vomiting and abdominal pain.  Neurological: Positive for dizziness and light-headedness. Negative for syncope and headaches.  All other systems reviewed and are negative.    Allergies  Penicillins and Sulfa antibiotics  Home Medications   Current Outpatient Rx  Name  Route  Sig  Dispense  Refill  . ACETAMINOPHEN 325 MG PO TABS   Oral   Take 2 tablets (650 mg total) by mouth every 4 (four) hours as needed.         . ASPIRIN 81 MG PO TBEC   Oral   Take 1 tablet (81 mg total) by mouth daily.         . ATORVASTATIN CALCIUM 10 MG PO TABS   Oral   Take 1 tablet (10 mg total) by mouth daily at 6 PM.   30  tablet   5   . ENOXAPARIN SODIUM 150 MG/ML [place] SOLN   Subcutaneous   Inject 0.49 mLs (75 mg total) into the skin every 12 (twelve) hours.   3 Syringe   0   . ENSURE COMPLETE PO LIQD   Oral   Take 237 mLs by mouth 2 (two) times daily between meals.         . FUROSEMIDE 40 MG PO TABS   Oral   Take 1 tablet (40 mg total) by mouth daily.   30 tablet   5   . INSULIN GLARGINE 100 UNIT/ML [place] SOLN   Subcutaneous   Inject 20 Units into the skin at bedtime.   10 mL   5   . LIVING WELL WITH DIABETES BOOK   Does not apply   1 each by Does not apply route once.         Marland Kitchen LORAZEPAM 0.5 MG PO TABS   Oral   Take 0.5 tablets (0.25 mg total) by mouth every 12 (twelve) hours as needed for anxiety.   30 tablet   0   . METFORMIN HCL 500 MG PO TABS   Oral   Take 1 tablet (500 mg total) by  mouth 2 (two) times daily with a meal.   60 tablet   5   . METOPROLOL SUCCINATE ER 25 MG PO TB24   Oral   Take 1 tablet (25 mg total) by mouth daily.   30 tablet   5   . ADULT MULTIVITAMIN LIQUID CH   Oral   Take 5 mLs by mouth daily.         Marland Kitchen PANTOPRAZOLE SODIUM 40 MG PO TBEC   Oral   Take 1 tablet (40 mg total) by mouth daily at 6 (six) AM.   30 tablet   5   . SPIRONOLACTONE 12.5 MG HALF TABLET   Oral   Take 0.5 tablets (12.5 mg total) by mouth daily.   30 tablet   5   . WARFARIN SODIUM 5 MG PO TABS   Oral   Take 1 tablet (5 mg total) by mouth daily.   30 tablet   11     BP 100/56  Pulse 82  Temp 98.3 F (36.8 C) (Oral)  Resp 20  Ht 5\' 6"  (1.676 m)  Wt 163 lb (73.936 kg)  BMI 26.31 kg/m2  SpO2 94%  Physical Exam  Nursing note and vitals reviewed. Constitutional: She appears well-developed and well-nourished. No distress.  HENT:  Head: Normocephalic and atraumatic.  Mouth/Throat: Oropharynx is clear and moist.  Neck: Normal range of motion. Neck supple.  Cardiovascular: Normal rate, regular rhythm, normal heart sounds and intact distal pulses.   No murmur heard. Pulmonary/Chest: Effort normal and breath sounds normal. No respiratory distress. She has no wheezes. She has no rales.  Musculoskeletal: Normal range of motion.  Neurological: She is alert.  Skin: Skin is warm, dry and intact. She is not diaphoretic.       Mild erythema of the right foot  Psychiatric: She has a normal mood and affect.    ED Course  Procedures (including critical care time)   Labs Reviewed  PROTIME-INR  APTT  CBC WITH DIFFERENTIAL  BASIC METABOLIC PANEL   Dg Chest 2 View  07/11/2012  *RADIOLOGY REPORT*  Clinical Data: Shortness of breath.  CHEST - 2 VIEW  Comparison: 06/27/2012.  Findings: The heart is borderline enlarged but stable. Overall improved lung aeration.  Mild vascular  congestion but no interstitial edema.  There are persistent bilateral lower lobe  infiltrates and/or atelectasis but resolving pleural effusions.  IMPRESSION: Persistent bibasilar atelectasis or infiltrates but resolving pleural effusions. Overall improved aeration with mild vascular congestion.   Original Report Authenticated By: Rudie Meyer, M.D.      No diagnosis found.   Date: 07/11/2012  Rate: 81  Rhythm: normal sinus rhythm  QRS Axis: normal  Intervals: normal  ST/T Wave abnormalities: nonspecific T wave changes  Conduction Disutrbances:none  Narrative Interpretation:   Old EKG Reviewed: unchanged  Discussed with Dr. Toniann Fail who has agreed to admit the patient.  MDM  Patient with a complicated medical history who was discharged from the hospital 2 days ago presents to the ED today with a chief complaint of dizziness, nausea, and SOB.  EKG unchanged.  Initial troponin negative.  CXR showing improvement of Pleural effusion.  However, her BNP was found to be over 10,000.  Patient therefore, admitted to [facility] service for further management of CHF exacerbation.          Pascal Lux [place], PA-C 07/12/12 [phone]

## 2012-07-12 ENCOUNTER — Encounter (HOSPITAL_COMMUNITY): Payer: Self-pay | Admitting: Internal Medicine

## 2012-07-12 DIAGNOSIS — I5043 Acute on chronic combined systolic (congestive) and diastolic (congestive) heart failure: Secondary | ICD-10-CM | POA: Insufficient documentation

## 2012-07-12 DIAGNOSIS — D638 Anemia in other chronic diseases classified elsewhere: Secondary | ICD-10-CM | POA: Diagnosis present

## 2012-07-12 DIAGNOSIS — I2510 Atherosclerotic heart disease of native coronary artery without angina pectoris: Secondary | ICD-10-CM

## 2012-07-12 DIAGNOSIS — I480 Paroxysmal atrial fibrillation: Secondary | ICD-10-CM | POA: Diagnosis present

## 2012-07-12 DIAGNOSIS — E119 Type 2 diabetes mellitus without complications: Secondary | ICD-10-CM

## 2012-07-12 DIAGNOSIS — I82409 Acute embolism and thrombosis of unspecified deep veins of unspecified lower extremity: Secondary | ICD-10-CM

## 2012-07-12 DIAGNOSIS — I509 Heart failure, unspecified: Secondary | ICD-10-CM | POA: Insufficient documentation

## 2012-07-12 DIAGNOSIS — Z7901 Long term (current) use of anticoagulants: Secondary | ICD-10-CM

## 2012-07-12 LAB — COMPREHENSIVE METABOLIC PANEL
Alkaline Phosphatase: 199 U/L — ABNORMAL HIGH (ref 39–117)
BUN: 26 mg/dL — ABNORMAL HIGH (ref 6–23)
Chloride: 99 mEq/L (ref 96–112)
GFR calc Af Amer: 58 mL/min — ABNORMAL LOW (ref >90)
Glucose, Bld: 240 mg/dL — ABNORMAL HIGH (ref 70–99)
Potassium: 4.4 mEq/L (ref 3.5–5.1)
Total Bilirubin: 0.4 mg/dL (ref 0.3–1.2)

## 2012-07-12 LAB — CBC WITH DIFFERENTIAL/PLATELET
Basophils Absolute: 0 10*3/uL (ref 0.0–0.1)
Lymphocytes Relative: 18 % (ref 12–46)
Neutro Abs: 5.8 10*3/uL (ref 1.7–7.7)
Neutrophils Relative %: 72 % (ref 43–77)
Platelets: 364 10*3/uL (ref 150–400)
RDW: 15.5 % (ref 11.5–15.5)
WBC: 8.1 10*3/uL (ref 4.0–10.5)

## 2012-07-12 LAB — TSH: TSH: 3.371 u[IU]/mL (ref 0.350–4.500)

## 2012-07-12 LAB — PROTIME-INR
INR: 2.65 — ABNORMAL HIGH (ref 0.00–1.49)
Prothrombin Time: 27 seconds — ABNORMAL HIGH (ref 11.6–15.2)

## 2012-07-12 LAB — GLUCOSE, CAPILLARY: Glucose-Capillary: 127 mg/dL — ABNORMAL HIGH (ref 70–99)

## 2012-07-12 LAB — TROPONIN I: Troponin I: <0.3 ng/mL (ref <0.30)

## 2012-07-12 MED ORDER — ACETAMINOPHEN 650 MG RE SUPP: 650.0000 mg | Freq: Four times a day (QID) | RECTAL | Status: DC | PRN

## 2012-07-12 MED ORDER — ENSURE COMPLETE PO LIQD
237.0000 mL | Freq: Two times a day (BID) | ORAL | Status: DC
  Administered 2012-07-12 – 2012-07-19 (×13): 237 mL via ORAL

## 2012-07-12 MED ORDER — SPIRONOLACTONE 12.5 MG HALF TABLET
12.5000 mg | ORAL_TABLET | Freq: Every day | ORAL | Status: DC
  Administered 2012-07-12 – 2012-07-13 (×2): 12.5 mg via ORAL
  Filled 2012-07-12 (×2): qty 1

## 2012-07-12 MED ORDER — ALUM & MAG HYDROXIDE-SIMETH 200-200-20 MG/5ML PO SUSP
30.0000 mL | Freq: Four times a day (QID) | ORAL | Status: DC | PRN
  Administered 2012-07-18 – 2012-07-21 (×3): 30 mL via ORAL
  Filled 2012-07-12 (×3): qty 30

## 2012-07-12 MED ORDER — WARFARIN - PHARMACIST DOSING INPATIENT
Freq: Every day | Status: DC
  Administered 2012-07-16: 18:00:00
  Administered 2012-07-22: 1
  Administered 2012-07-24: 18:00:00

## 2012-07-12 MED ORDER — PANTOPRAZOLE SODIUM 40 MG PO TBEC
40.0000 mg | DELAYED_RELEASE_TABLET | Freq: Every day | ORAL | Status: DC
  Administered 2012-07-12 – 2012-07-19 (×8): 40 mg via ORAL
  Filled 2012-07-12 (×8): qty 1

## 2012-07-12 MED ORDER — ONDANSETRON HCL 4 MG PO TABS
4.0000 mg | ORAL_TABLET | Freq: Four times a day (QID) | ORAL | Status: DC | PRN
  Administered 2012-07-16 – 2012-07-25 (×6): 4 mg via ORAL
  Filled 2012-07-12 (×6): qty 1

## 2012-07-12 MED ORDER — ZOLPIDEM TARTRATE 5 MG PO TABS
5.0000 mg | ORAL_TABLET | Freq: Every evening | ORAL | Status: DC | PRN
  Administered 2012-07-14 – 2012-07-21 (×5): 5 mg via ORAL
  Filled 2012-07-12 (×5): qty 1

## 2012-07-12 MED ORDER — ONDANSETRON HCL 4 MG/2ML IJ SOLN
4.0000 mg | Freq: Four times a day (QID) | INTRAMUSCULAR | Status: DC | PRN
  Administered 2012-07-12 – 2012-07-23 (×4): 4 mg via INTRAVENOUS
  Filled 2012-07-12 (×4): qty 2

## 2012-07-12 MED ORDER — FUROSEMIDE 10 MG/ML IJ SOLN
40.0000 mg | Freq: Every day | INTRAMUSCULAR | Status: DC
  Administered 2012-07-12 – 2012-07-19 (×8): 40 mg via INTRAVENOUS
  Filled 2012-07-12 (×8): qty 4

## 2012-07-12 MED ORDER — INSULIN GLARGINE 100 UNIT/ML ~~LOC~~ SOLN
20.0000 [IU] | Freq: Every day | SUBCUTANEOUS | Status: DC
  Administered 2012-07-12 (×2): 20 [IU] via SUBCUTANEOUS

## 2012-07-12 MED ORDER — SODIUM CHLORIDE 0.9 % IJ SOLN
3.0000 mL | Freq: Two times a day (BID) | INTRAMUSCULAR | Status: DC
  Administered 2012-07-14: 3 mL via INTRAVENOUS

## 2012-07-12 MED ORDER — WARFARIN SODIUM 5 MG PO TABS
5.0000 mg | ORAL_TABLET | Freq: Every day | ORAL | Status: DC
  Administered 2012-07-12 – 2012-07-15 (×4): 5 mg via ORAL
  Filled 2012-07-12 (×5): qty 1

## 2012-07-12 MED ORDER — INSULIN ASPART 100 UNIT/ML ~~LOC~~ SOLN
0.0000 [IU] | Freq: Three times a day (TID) | SUBCUTANEOUS | Status: DC
  Administered 2012-07-12 (×2): 2 [IU] via SUBCUTANEOUS
  Administered 2012-07-12 – 2012-07-13 (×2): 1 [IU] via SUBCUTANEOUS
  Administered 2012-07-13: 2 [IU] via SUBCUTANEOUS
  Administered 2012-07-14 (×2): 1 [IU] via SUBCUTANEOUS
  Administered 2012-07-15 – 2012-07-16 (×3): 2 [IU] via SUBCUTANEOUS
  Administered 2012-07-16: 1 [IU] via SUBCUTANEOUS
  Administered 2012-07-17 (×2): 2 [IU] via SUBCUTANEOUS
  Administered 2012-07-18: 3 [IU] via SUBCUTANEOUS
  Administered 2012-07-18 – 2012-07-19 (×4): 2 [IU] via SUBCUTANEOUS
  Administered 2012-07-19 – 2012-07-20 (×3): 1 [IU] via SUBCUTANEOUS
  Administered 2012-07-20: 3 [IU] via SUBCUTANEOUS
  Administered 2012-07-21 (×2): 1 [IU] via SUBCUTANEOUS
  Administered 2012-07-22 (×2): 2 [IU] via SUBCUTANEOUS
  Administered 2012-07-23: 1 [IU] via SUBCUTANEOUS
  Administered 2012-07-23: 2 [IU] via SUBCUTANEOUS
  Administered 2012-07-23: 1 [IU] via SUBCUTANEOUS
  Administered 2012-07-24: 2 [IU] via SUBCUTANEOUS
  Administered 2012-07-24 – 2012-07-25 (×4): 1 [IU] via SUBCUTANEOUS

## 2012-07-12 MED ORDER — ACETAMINOPHEN 325 MG PO TABS
650.0000 mg | ORAL_TABLET | Freq: Four times a day (QID) | ORAL | Status: DC | PRN
  Administered 2012-07-12 – 2012-07-16 (×7): 650 mg via ORAL
  Filled 2012-07-12 (×8): qty 2

## 2012-07-12 MED ORDER — LORAZEPAM 0.5 MG PO TABS
0.5000 mg | ORAL_TABLET | Freq: Four times a day (QID) | ORAL | Status: DC | PRN
  Administered 2012-07-12 – 2012-07-21 (×7): 0.5 mg via ORAL
  Filled 2012-07-12 (×8): qty 1

## 2012-07-12 MED ORDER — ATORVASTATIN CALCIUM 10 MG PO TABS
10.0000 mg | ORAL_TABLET | Freq: Every day | ORAL | Status: DC
  Administered 2012-07-12 – 2012-07-16 (×5): 10 mg via ORAL
  Filled 2012-07-12 (×6): qty 1

## 2012-07-12 MED ORDER — ISOSORBIDE MONONITRATE ER 30 MG PO TB24
30.0000 mg | ORAL_TABLET | Freq: Every day | ORAL | Status: DC
  Administered 2012-07-12 – 2012-07-19 (×8): 30 mg via ORAL
  Filled 2012-07-12 (×8): qty 1

## 2012-07-12 MED ORDER — ENOXAPARIN SODIUM 80 MG/0.8ML ~~LOC~~ SOLN
70.0000 mg | Freq: Once | SUBCUTANEOUS | Status: AC
  Administered 2012-07-12: 70 mg via SUBCUTANEOUS
  Filled 2012-07-12: qty 0.8

## 2012-07-12 MED ORDER — TRAMADOL HCL 50 MG PO TABS
50.0000 mg | ORAL_TABLET | Freq: Four times a day (QID) | ORAL | Status: DC | PRN
  Administered 2012-07-12 – 2012-07-23 (×6): 50 mg via ORAL
  Filled 2012-07-12 (×8): qty 1

## 2012-07-12 MED ORDER — METOPROLOL SUCCINATE ER 25 MG PO TB24
25.0000 mg | ORAL_TABLET | Freq: Every day | ORAL | Status: DC
  Administered 2012-07-12 – 2012-07-15 (×4): 25 mg via ORAL
  Filled 2012-07-12 (×4): qty 1

## 2012-07-12 MED ORDER — ASPIRIN EC 81 MG PO TBEC
81.0000 mg | DELAYED_RELEASE_TABLET | Freq: Every day | ORAL | Status: DC
  Administered 2012-07-13 – 2012-07-25 (×13): 81 mg via ORAL
  Filled 2012-07-12 (×14): qty 1

## 2012-07-12 MED ORDER — LORAZEPAM 0.5 MG PO TABS: 0.2500 mg | ORAL_TABLET | Freq: Two times a day (BID) | ORAL | Status: DC | PRN

## 2012-07-12 MED ORDER — SODIUM CHLORIDE 0.9 % IJ SOLN
3.0000 mL | Freq: Two times a day (BID) | INTRAMUSCULAR | Status: DC
  Administered 2012-07-12 – 2012-07-24 (×26): 3 mL via INTRAVENOUS

## 2012-07-12 NOTE — Progress Notes (Signed)
INITIAL ADULT NUTRITION ASSESSMENT Date: 07/12/2012   Time: 10:16 AM Reason for Assessment: MST (Malnutrition Screening Tool)   INTERVENTION: 1. Continue Ensure Complete as ordered 2. RD will continue to follow    DOCUMENTATION CODES Per approved criteria  -Not Applicable     ASSESSMENT: Female 66 y.o.  Dx: CHF exacerbation  Hx:  Past Medical History  Diagnosis Date  . Cardiomyopathy, EF 25% 06/29/2012  . Type II diabetes mellitus   . Coronary artery disease     Past Surgical History  Procedure Date  . Vaginal hysterectomy ~ 1980  . Cesarean section 1979  . Lumbar disc surgery 1980's  . Facial reconstruction surgery   . Cardiac catheterization     Related Meds:     . aspirin EC  81 mg Oral Daily  . atorvastatin  10 mg Oral q1800  . [COMPLETED] enoxaparin (LOVENOX) injection  70 mg Subcutaneous Once  . feeding supplement  237 mL Oral BID BM  . furosemide  40 mg Intravenous Daily  . insulin aspart  0-9 Units Subcutaneous TID WC  . insulin glargine  20 Units Subcutaneous QHS  . metoprolol succinate  25 mg Oral Daily  . pantoprazole  40 mg Oral Q0600  . [COMPLETED] sodium chloride  500 mL Intravenous Once  . sodium chloride  3 mL Intravenous Q12H  . sodium chloride  3 mL Intravenous Q12H  . spironolactone  12.5 mg Oral Daily  . warfarin  5 mg Oral q1800  . Warfarin - Pharmacist Dosing Inpatient   Does not apply q1800     Ht: 5\' 6"  (167.6 cm)  Wt: 160 lb 7.9 oz (72.8 kg) (scale B)  Ideal Wt: 61.8 kg  % Ideal Wt: 118%  Usual Wt: 145 lbs per previous notes Wt Readings from Last 5 Encounters:  07/12/12 160 lb 7.9 oz (72.8 kg)  07/09/12 163 lb 2.3 oz (74 kg)  07/09/12 163 lb 2.3 oz (74 kg)    % Usual Wt: 110%  Body mass index is 25.90 kg/(m^2). Pt is overweight per current BMI   Food/Nutrition Related Hx: Pt indicated weight loss and poor po intake PTA.   Labs:  CMP     Component Value Date/Time   NA 134* 07/12/2012 0139   K 4.4 07/12/2012 0139     CL 99 07/12/2012 0139   CO2 24 07/12/2012 0139   GLUCOSE 240* 07/12/2012 0139   BUN 26* 07/12/2012 0139   CREATININE 1.12* 07/12/2012 0139   CALCIUM 11.0* 07/12/2012 0139   CALCIUM 11.1* 07/03/2012 0513   PROT 6.9 07/12/2012 0139   ALBUMIN 2.6* 07/12/2012 0139   AST 14 07/12/2012 0139   ALT 16 07/12/2012 0139   ALKPHOS 199* 07/12/2012 0139   BILITOT 0.4 07/12/2012 0139   GFRNONAA 50* 07/12/2012 0139   GFRAA 58* 07/12/2012 0139      Intake/Output Summary (Last 24 hours) at 07/12/12 1031 Last data filed at 07/12/12 0900  Gross per 24 hour  Intake    717 ml  Output    500 ml  Net    217 ml     Diet Order: Cardiac  Supplements/Tube Feeding: Ensure Complete po BID, each supplement provides 350 kcal and 13 grams of protein.   IVF:    Estimated Nutritional Needs:   Kcal: 1600-1800  Protein: 75-85 gm  Fluid:  1.6-1.8 L   Pt was recently d/c'd. Admitted with CHF exacerbation.  Pt was previously drinking Ensure for to supplement po intake. States she is  continuing this. Weight at this admission is down, likely r/t fluids.  Continue Ensure per patient preference.    NUTRITION DIAGNOSIS: Inadequate oral intake r/t poor appetite AEB continued Ensure supplements   MONITORING/EVALUATION(Goals): Goal: PO intake to meet >/=90% estimated nutrition needs  Monitor: PO intake, weight, labs, fluids  EDUCATION NEEDS: -No education needs identified at this time   Clarene Duke RD, LDN Pager [phone] After Hours pager [phone]  07/12/2012, 10:16 AM

## 2012-07-12 NOTE — Progress Notes (Signed)
Inpatient Diabetes Program Recommendations  AACE/ADA: New Consensus Statement on Inpatient Glycemic Control (2013)  Target Ranges:  Prepandial:   less than 140 mg/dL      Peak postprandial:   less than 180 mg/dL (1-2 hours)      Critically ill patients:  140 - 180 mg/dL   MD, please consider discharging patient on Metformin and Amaryl instead of insulin.  Patient was recently discharged on Lantus and she did not get it filled because she could not afford it.  Also concerned that patient does not have a primary MD to follow up with for insulin adjustments if necessary.  The patient said she has never been on any med before for her diabetes.  The patient feels that she will have better control once she is feeling better and can get back to her normal activity.  She said that she has not been feeling well the past few months and therefore has not been as active.  She also does not have a glucose meter at home.  This coordinator gave her information for the [employer] meter and strips for her to purchase after discharge.  The patient was concerned about the amount of carbohydrates that were coming on her trays here in the hospital.  Added carbohydrate medium to current heart healthy diet.  The patient said that she watches her carbs at home.  Will follow during this admission. Thank you  Piedad Climes RN,BSN,CDE Inpatient Diabetes Coordinator [phone] (team pager)

## 2012-07-12 NOTE — Discharge Summary (Signed)
Patient ID: [name] [name],  MRN: [id number], DOB/AGE: 1945-10-16 66 y.o.  Admit date: 07/11/2012 Discharge date: 07/12/2012  Primary Care Provider:  Primary Cardiologist: Dr Tresa Endo (new)  Discharge Diagnoses Principal Problem: *Acute systolic CHF (congestive heart failure)  Active Problems:  Pedal edema Cellulitis  LBBB (left bundle branch block)  DVT Rt LE Oct 2013  DM (diabetes mellitus) Acute renal insufficiency after diuresis Ischemic cardiomyopathy, EF 25%  Popliteal artery thrombosis, right  CAD, severe 3V by cath 07/05/12     Procedures: Coronary angiogram 07/05/12   Hospital Course: Unfortunate 66 y/o female who had not seen an MD in 69yrs, we saw in consult at [facility] 06/21/12 after she presented with 2 weeks of lower extremity edema and SOB. Her EKG showed LBBB. Her BNP was 10k. She was admitted and diuresed. Lower extremity dopplers revealed a Rt post tibial vein DVT. Heparin and Coumadin were started and she was also treated for superimposed cellulitis. An arterial doppler of the LE revealed Rt popliteal artery on 06/29/12 after she complained of a cold Rt foot. It was ultimately decided diagnostic cath was indicated. Her Coumadin was held, Heparin continued, and she was transferred to our service at [facility] 07/05/12. Cath revealed severe 3 CAD. The pt did have transient renal insufficiency during this period that started after she was diuresed. Her Scr remained stable post cath. Dr Morton Peters saw her in consult 07/06/12. He did not feel she was currently a candidate for CABG with multiple active medical issues- DVT,cellulitis, UTI, and uncontrolled diabetes. She was seen by Dr Rennis Golden on the 16th and felt to be stable for discharge. She will follow up in our office for INR and an extender visit in one week.    Discharge Vitals:  Blood pressure 100/60, pulse 72, temperature 98.6 F (37 C), temperature source Oral, resp. rate 18, height 5\' 6"  (1.676 m), weight 72.8 kg (160 lb 7.9  oz), SpO2 100.00%.    Labs: Results for orders placed during the hospital encounter of 07/11/12 (from the past 48 hour(s))  PROTIME-INR     Status: Abnormal   Collection Time   07/11/12  7:43 PM      Component Value Range Comment   Prothrombin Time 26.1 (*) 11.6 - 15.2 seconds    INR 2.54 (*) 0.00 - 1.49   APTT     Status: Abnormal   Collection Time   07/11/12  7:43 PM      Component Value Range Comment   aPTT 59 (*) 24 - 37 seconds   CBC WITH DIFFERENTIAL     Status: Abnormal   Collection Time   07/11/12  7:43 PM      Component Value Range Comment   WBC 8.9  4.0 - 10.5 K/uL    RBC 3.62 (*) 3.87 - 5.11 MIL/uL    Hemoglobin 9.6 (*) 12.0 - 15.0 g/dL    HCT 29.5 (*) 28.4 - 46.0 %    MCV 80.1  78.0 - 100.0 fL    MCH 26.5  26.0 - 34.0 pg    MCHC 33.1  30.0 - 36.0 g/dL    RDW 13.2 (*) 44.0 - 15.5 %    Platelets 386  150 - 400 K/uL    Neutrophils Relative 68  43 - 77 %    Neutro Abs 6.1  1.7 - 7.7 K/uL    Lymphocytes Relative 21  12 - 46 %    Lymphs Abs 1.9  0.7 - 4.0 K/uL  Monocytes Relative 9  3 - 12 %    Monocytes Absolute 0.8  0.1 - 1.0 K/uL    Eosinophils Relative 2  0 - 5 %    Eosinophils Absolute 0.2  0.0 - 0.7 K/uL    Basophils Relative 0  0 - 1 %    Basophils Absolute 0.0  0.0 - 0.1 K/uL   BASIC METABOLIC PANEL     Status: Abnormal   Collection Time   07/11/12  7:43 PM      Component Value Range Comment   Sodium 133 (*) 135 - 145 mEq/L    Potassium 4.2  3.5 - 5.1 mEq/L    Chloride 97  96 - 112 mEq/L    CO2 26  19 - 32 mEq/L    Glucose, Bld 199 (*) 70 - 99 mg/dL    BUN 27 (*) 6 - 23 mg/dL    Creatinine, Ser 0.45 (*) 0.50 - 1.10 mg/dL    Calcium 40.9 (*) 8.4 - 10.5 mg/dL    GFR calc non Af Amer 48 (*) >90 mL/min    GFR calc Af Amer 55 (*) >90 mL/min   PRO B NATRIURETIC PEPTIDE     Status: Abnormal   Collection Time   07/11/12  8:01 PM      Component Value Range Comment   Pro B Natriuretic peptide (BNP) 10853.0 (*) 0 - 125 pg/mL   POCT I-STAT TROPONIN I      Status: Normal   Collection Time   07/11/12  8:04 PM      Component Value Range Comment   Troponin i, poc 0.01  0.00 - 0.08 ng/mL    Comment 3            GLUCOSE, CAPILLARY     Status: Abnormal   Collection Time   07/11/12  8:09 PM      Component Value Range Comment   Glucose-Capillary 179 (*) 70 - 99 mg/dL   TROPONIN I     Status: Normal   Collection Time   07/12/12  1:39 AM      Component Value Range Comment   Troponin I <0.30  <0.30 ng/mL   COMPREHENSIVE METABOLIC PANEL     Status: Abnormal   Collection Time   07/12/12  1:39 AM      Component Value Range Comment   Sodium 134 (*) 135 - 145 mEq/L    Potassium 4.4  3.5 - 5.1 mEq/L    Chloride 99  96 - 112 mEq/L    CO2 24  19 - 32 mEq/L    Glucose, Bld 240 (*) 70 - 99 mg/dL    BUN 26 (*) 6 - 23 mg/dL    Creatinine, Ser 8.11 (*) 0.50 - 1.10 mg/dL    Calcium 91.4 (*) 8.4 - 10.5 mg/dL    Total Protein 6.9  6.0 - 8.3 g/dL    Albumin 2.6 (*) 3.5 - 5.2 g/dL    AST 14  0 - 37 U/L    ALT 16  0 - 35 U/L    Alkaline Phosphatase 199 (*) 39 - 117 U/L    Total Bilirubin 0.4  0.3 - 1.2 mg/dL    GFR calc non Af Amer 50 (*) >90 mL/min    GFR calc Af Amer 58 (*) >90 mL/min   CBC WITH DIFFERENTIAL     Status: Abnormal   Collection Time   07/12/12  1:39 AM      Component Value Range Comment  WBC 8.1  4.0 - 10.5 K/uL    RBC 3.55 (*) 3.87 - 5.11 MIL/uL    Hemoglobin 8.9 (*) 12.0 - 15.0 g/dL    HCT 21.3 (*) 08.6 - 46.0 %    MCV 79.2  78.0 - 100.0 fL    MCH 25.1 (*) 26.0 - 34.0 pg    MCHC 31.7  30.0 - 36.0 g/dL    RDW 57.8  46.9 - 62.9 %    Platelets 364  150 - 400 K/uL    Neutrophils Relative 72  43 - 77 %    Neutro Abs 5.8  1.7 - 7.7 K/uL    Lymphocytes Relative 18  12 - 46 %    Lymphs Abs 1.5  0.7 - 4.0 K/uL    Monocytes Relative 8  3 - 12 %    Monocytes Absolute 0.7  0.1 - 1.0 K/uL    Eosinophils Relative 1  0 - 5 %    Eosinophils Absolute 0.1  0.0 - 0.7 K/uL    Basophils Relative 0  0 - 1 %    Basophils Absolute 0.0  0.0 - 0.1  K/uL   TSH     Status: Normal   Collection Time   07/12/12  1:39 AM      Component Value Range Comment   TSH 3.371  0.350 - 4.500 uIU/mL   GLUCOSE, CAPILLARY     Status: Abnormal   Collection Time   07/12/12  6:09 AM      Component Value Range Comment   Glucose-Capillary 179 (*) 70 - 99 mg/dL    Comment 1 Notify RN      Comment 2 Documented in Chart     PROTIME-INR     Status: Abnormal   Collection Time   07/12/12  6:55 AM      Component Value Range Comment   Prothrombin Time 27.0 (*) 11.6 - 15.2 seconds    INR 2.65 (*) 0.00 - 1.49   TROPONIN I     Status: Normal   Collection Time   07/12/12  7:51 AM      Component Value Range Comment   Troponin I <0.30  <0.30 ng/mL   PRO B NATRIURETIC PEPTIDE     Status: Abnormal   Collection Time   07/12/12  7:51 AM      Component Value Range Comment   Pro B Natriuretic peptide (BNP) 11428.0 (*) 0 - 125 pg/mL     Disposition:    Discharge Medications:    Medication List     As of 07/12/2012 10:47 AM    ASK your doctor about these medications         acetaminophen 325 MG tablet   Commonly known as: TYLENOL   Take 2 tablets (650 mg total) by mouth every 4 (four) hours as needed.      aspirin 81 MG EC tablet   Take 1 tablet (81 mg total) by mouth daily.      atorvastatin 10 MG tablet   Commonly known as: LIPITOR   Take 1 tablet (10 mg total) by mouth daily at 6 PM.      enoxaparin 150 MG/ML injection   Commonly known as: LOVENOX   Inject 0.49 mLs (75 mg total) into the skin every 12 (twelve) hours.      feeding supplement Liqd   Take 237 mLs by mouth 2 (two) times daily between meals.      furosemide 40 MG tablet   Commonly  known as: LASIX   Take 1 tablet (40 mg total) by mouth daily.      insulin glargine 100 UNIT/ML injection   Commonly known as: LANTUS   Inject 20 Units into the skin at bedtime.      LORazepam 0.5 MG tablet   Commonly known as: ATIVAN   Take 0.5 tablets (0.25 mg total) by mouth every 12 (twelve)  hours as needed for anxiety.      metFORMIN 500 MG tablet   Commonly known as: GLUCOPHAGE   Take 1 tablet (500 mg total) by mouth 2 (two) times daily with a meal.      metoprolol succinate 25 MG 24 hr tablet   Commonly known as: TOPROL-XL   Take 1 tablet (25 mg total) by mouth daily.      multivitamin Liqd   Take 5 mLs by mouth daily.      pantoprazole 40 MG tablet   Commonly known as: PROTONIX   Take 1 tablet (40 mg total) by mouth daily at 6 (six) AM.      spironolactone 12.5 mg Tabs   Commonly known as: ALDACTONE   Take 0.5 tablets (12.5 mg total) by mouth daily.      warfarin 5 MG tablet   Commonly known as: COUMADIN   Take 1 tablet (5 mg total) by mouth daily.          Duration of Discharge Encounter: Greater than 30 minutes including physician time.  Jolene Provost PA-C 07/12/2012 10:47 AM

## 2012-07-12 NOTE — H&P (Signed)
[name] [name] is an 66 y.o. female.   Patient was seen and examined on July 12, 2012. PCP - none. Cardiologist - [facility]. Chief Complaint: Shortness of breath. HPI: 66 year-old female who was just discharged 3 days ago after being diagnosed with ischemic cardiomyopathy has had a cardiac catheter and was shown to have coronary disease and was advised CABG presents with complaints of shortness of breath. Patient states after discharge she started to feel more short of breath. States she has been taking her medications as advised. Denies any productive cough or any diaphoresis fever chills. Denies any nausea vomiting abdominal pain or diarrhea. In the ER patient was found to have increased BNP and chest x-ray which in congestion and at this time patient has been admitted for CHF. Patient presently is not in acute distress. Patient denies any chest pain though she did have some chest tightness.  Past Medical History  Diagnosis Date  . Cardiomyopathy, EF 25% 06/29/2012  . Type II diabetes mellitus   . Coronary artery disease     Past Surgical History  Procedure Date  . Vaginal hysterectomy ~ 1980  . Cesarean section 1979  . Lumbar disc surgery 1980's  . Facial reconstruction surgery   . Cardiac catheterization     Family History  Problem Relation Age of Onset  . Cancer Mother   . CAD Mother   . Cancer Father   . CAD Father    Social History:  reports that she has quit smoking. Her smoking use included Cigarettes. She has a 10 pack-year smoking history. She has never used smokeless tobacco. She reports that she does not drink alcohol or use illicit drugs.  Allergies:  Allergies  Allergen Reactions  . Penicillins Anaphylaxis  . Sulfa Antibiotics Anaphylaxis    Medications Prior to Admission  Medication Sig Dispense Refill  . acetaminophen (TYLENOL) 325 MG tablet Take 2 tablets (650 mg total) by mouth every 4 (four) hours as needed.      Marland Kitchen aspirin EC  81 MG EC tablet Take 1 tablet (81 mg total) by mouth daily.      Marland Kitchen atorvastatin (LIPITOR) 10 MG tablet Take 1 tablet (10 mg total) by mouth daily at 6 PM.  30 tablet  5  . enoxaparin (LOVENOX) 150 MG/ML injection Inject 0.49 mLs (75 mg total) into the skin every 12 (twelve) hours.  3 Syringe  0  . feeding supplement (ENSURE COMPLETE) LIQD Take 237 mLs by mouth 2 (two) times daily between meals.      . furosemide (LASIX) 40 MG tablet Take 1 tablet (40 mg total) by mouth daily.  30 tablet  5  . insulin glargine (LANTUS) 100 UNIT/ML injection Inject 20 Units into the skin at bedtime.  10 mL  5  . living well with diabetes book MISC 1 each by Does not apply route once.      Marland Kitchen LORazepam (ATIVAN) 0.5 MG tablet Take 0.5 tablets (0.25 mg total) by mouth every 12 (twelve) hours as needed for anxiety.  30 tablet  0  . metFORMIN (GLUCOPHAGE) 500 MG tablet Take 1 tablet (500 mg total) by mouth 2 (two) times daily with a meal.  60 tablet  5  . metoprolol succinate (TOPROL-XL) 25 MG 24 hr tablet Take 1 tablet (25 mg total) by mouth daily.  30 tablet  5  . Multiple Vitamin (MULTIVITAMIN) LIQD Take 5 mLs by mouth daily.      . pantoprazole (PROTONIX) 40 MG  tablet Take 1 tablet (40 mg total) by mouth daily at 6 (six) AM.  30 tablet  5  . spironolactone (ALDACTONE) 12.5 mg TABS Take 0.5 tablets (12.5 mg total) by mouth daily.  30 tablet  5  . warfarin (COUMADIN) 5 MG tablet Take 1 tablet (5 mg total) by mouth daily.  30 tablet  11    Results for orders placed during the hospital encounter of 07/11/12 (from the past 48 hour(s))  PROTIME-INR     Status: Abnormal   Collection Time   07/11/12  7:43 PM      Component Value Range Comment   Prothrombin Time 26.1 (*) 11.6 - 15.2 seconds    INR 2.54 (*) 0.00 - 1.49   APTT     Status: Abnormal   Collection Time   07/11/12  7:43 PM      Component Value Range Comment   aPTT 59 (*) 24 - 37 seconds   CBC WITH DIFFERENTIAL     Status: Abnormal   Collection Time    07/11/12  7:43 PM      Component Value Range Comment   WBC 8.9  4.0 - 10.5 K/uL    RBC 3.62 (*) 3.87 - 5.11 MIL/uL    Hemoglobin 9.6 (*) 12.0 - 15.0 g/dL    HCT 40.1 (*) 02.7 - 46.0 %    MCV 80.1  78.0 - 100.0 fL    MCH 26.5  26.0 - 34.0 pg    MCHC 33.1  30.0 - 36.0 g/dL    RDW 25.3 (*) 66.4 - 15.5 %    Platelets 386  150 - 400 K/uL    Neutrophils Relative 68  43 - 77 %    Neutro Abs 6.1  1.7 - 7.7 K/uL    Lymphocytes Relative 21  12 - 46 %    Lymphs Abs 1.9  0.7 - 4.0 K/uL    Monocytes Relative 9  3 - 12 %    Monocytes Absolute 0.8  0.1 - 1.0 K/uL    Eosinophils Relative 2  0 - 5 %    Eosinophils Absolute 0.2  0.0 - 0.7 K/uL    Basophils Relative 0  0 - 1 %    Basophils Absolute 0.0  0.0 - 0.1 K/uL   BASIC METABOLIC PANEL     Status: Abnormal   Collection Time   07/11/12  7:43 PM      Component Value Range Comment   Sodium 133 (*) 135 - 145 mEq/L    Potassium 4.2  3.5 - 5.1 mEq/L    Chloride 97  96 - 112 mEq/L    CO2 26  19 - 32 mEq/L    Glucose, Bld 199 (*) 70 - 99 mg/dL    BUN 27 (*) 6 - 23 mg/dL    Creatinine, Ser 4.03 (*) 0.50 - 1.10 mg/dL    Calcium 47.4 (*) 8.4 - 10.5 mg/dL    GFR calc non Af Amer 48 (*) >90 mL/min    GFR calc Af Amer 55 (*) >90 mL/min   PRO B NATRIURETIC PEPTIDE     Status: Abnormal   Collection Time   07/11/12  8:01 PM      Component Value Range Comment   Pro B Natriuretic peptide (BNP) 10853.0 (*) 0 - 125 pg/mL   POCT I-STAT TROPONIN I     Status: Normal   Collection Time   07/11/12  8:04 PM      Component Value Range  Comment   Troponin i, poc 0.01  0.00 - 0.08 ng/mL    Comment 3            GLUCOSE, CAPILLARY     Status: Abnormal   Collection Time   07/11/12  8:09 PM      Component Value Range Comment   Glucose-Capillary 179 (*) 70 - 99 mg/dL    Dg Chest 2 View  47/82/9562  *RADIOLOGY REPORT*  Clinical Data: Shortness of breath.  CHEST - 2 VIEW  Comparison: 06/27/2012.  Findings: The heart is borderline enlarged but stable. Overall  improved lung aeration.  Mild vascular congestion but no interstitial edema.  There are persistent bilateral lower lobe infiltrates and/or atelectasis but resolving pleural effusions.  IMPRESSION: Persistent bibasilar atelectasis or infiltrates but resolving pleural effusions. Overall improved aeration with mild vascular congestion.   Original Report Authenticated By: Rudie Meyer, M.D.     Review of Systems  Constitutional: Negative.   HENT: Negative.   Eyes: Negative.   Respiratory: Positive for shortness of breath.   Cardiovascular: Negative.   Gastrointestinal: Negative.   Genitourinary: Negative.   Musculoskeletal: Negative.   Skin: Negative.   Neurological: Negative.   Endo/Heme/Allergies: Negative.   Psychiatric/Behavioral: Negative.     Blood pressure 121/65, pulse 80, temperature 99.2 F (37.3 C), temperature source Oral, resp. rate 18, height 5\' 6"  (1.676 m), weight 72.8 kg (160 lb 7.9 oz), SpO2 97.00%. Physical Exam  Constitutional: She appears well-developed and well-nourished. No distress.  HENT:  Head: Normocephalic and atraumatic.  Right Ear: External ear normal.  Left Ear: External ear normal.  Nose: Nose normal.  Mouth/Throat: No oropharyngeal exudate.  Eyes: Conjunctivae normal are normal. Pupils are equal, round, and reactive to light. Right eye exhibits no discharge. Left eye exhibits no discharge. No scleral icterus.  Neck: Normal range of motion. Neck supple.  Cardiovascular: Normal rate and regular rhythm.   Respiratory: Effort normal and breath sounds normal. No respiratory distress. She has no wheezes. She has no rales.  GI: Soft. Bowel sounds are normal. She exhibits no distension. There is no tenderness. There is no rebound.  Musculoskeletal:       Erythema right foot.  Skin: Skin is warm. She is not diaphoretic.     Assessment/Plan #1. Decompensated CHF with history of ischemic cardiomyopathy last EF measured was 25% this month with known history of  CAD plan to have CABG - continue with Lasix 40 mg IV daily along with spironolactone. Closely follow intake output and metabolic panel. Cycle cardiac markers. Continue aspirin. Consult [facility] in a.m. #2. DVT of the lower extremities with right popliteal artery thrombus - I have discussed with pharmacy. At this time we will continue one more day of Lovenox along with Coumadin and if INR is therapeutic then discontinue Lovenox. #3. Diabetes mellitus type 2 - continue home medications with sliding-scale coverage. #4. Recent cellulitis of the right lower extremity - presently has no signs of active infection. #5. Hypercalcemia secondary to hyperparathyroidism  - closely follow calcium levels.  #6. Chronic kidney disease - closely follow metabolic panel and intake output.  CODE STATUS - full code.  Wash Nienhaus N. 07/12/2012, 1:26 AM

## 2012-07-12 NOTE — Consult Note (Signed)
Reason for Consult: CHF  Requesting Physician: Claybon Jabs  HPI: This is a 66 y.o. female just discharged after being hospitalized 06/21/12-07/09/12 for Lower Extremity edema -- bilateral DVT & R Popliteal Artery occlusion. We saw her in consult, then transferred her to [facility] service after LHC demonstrated severe 3V. She was not felt to be a candidate at this time for CABG and the plan was to treat her multiple medical issues and then plan elective CABG at a later date. She was discjharged on Coumadin. She presented late 07/11/12 with SOB, her was BNP 10K and she also had PAF in the ER (NSR now).     PMHx:  Past Medical History  Diagnosis Date  . 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 Diabetes mellitus type 2, uncontrolled, with complications     Very poorly controlled  . Coronary artery disease     Severe Multivessel Disase  . DVT, bilateral lower limbs 05/2012  . 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 Vaginal hysterectomy ~ 1980  . Cesarean section 1979  . Lumbar disc surgery 1980's  . Facial reconstruction surgery   . Cardiac catheterization     FAMHx: Family History  Problem Relation Age of Onset  . Cancer Mother   . CAD Mother   . Cancer Father   . CAD Father     SOCHx:  reports that she has quit smoking. Her smoking use included Cigarettes. She has a 10 pack-year smoking history. She has never used smokeless tobacco. She reports that she does not drink alcohol or use illicit drugs.  ALLERGIES: Allergies  Allergen Reactions  . Penicillins Anaphylaxis  . Sulfa Antibiotics Anaphylaxis    ROS: Pertinent items are noted in HPI. She had atypical localized lt chest pain at home. No awareness of palpitations or tachycardia  HOME MEDICATIONS: Prescriptions prior to admission  Medication Sig Dispense Refill  . acetaminophen (TYLENOL) 325 MG tablet Take 2 tablets (650 mg total) by mouth every 4 (four)  hours as needed.      Marland Kitchen aspirin EC 81 MG EC tablet Take 1 tablet (81 mg total) by mouth daily.      Marland Kitchen atorvastatin (LIPITOR) 10 MG tablet Take 1 tablet (10 mg total) by mouth daily at 6 PM.  30 tablet  5  . enoxaparin (LOVENOX) 150 MG/ML injection Inject 0.49 mLs (75 mg total) into the skin every 12 (twelve) hours.  3 Syringe  0  . feeding supplement (ENSURE COMPLETE) LIQD Take 237 mLs by mouth 2 (two) times daily between meals.      . furosemide (LASIX) 40 MG tablet Take 1 tablet (40 mg total) by mouth daily.  30 tablet  5  . insulin glargine (LANTUS) 100 UNIT/ML injection Inject 20 Units into the skin at bedtime.  10 mL  5  . LORazepam (ATIVAN) 0.5 MG tablet Take 0.5 tablets (0.25 mg total) by mouth every 12 (twelve) hours as needed for anxiety.  30 tablet  0  . metFORMIN (GLUCOPHAGE) 500 MG tablet Take 1 tablet (500 mg total) by mouth 2 (two) times daily with a meal.  60 tablet  5  . metoprolol succinate (TOPROL-XL) 25 MG 24 hr tablet Take 1 tablet (25 mg total) by mouth daily.  30 tablet  5  . Multiple Vitamin (MULTIVITAMIN) LIQD Take 5 mLs by mouth daily.      . pantoprazole (PROTONIX) 40 MG tablet Take 1 tablet (40  mg total) by mouth daily at 6 (six) AM.  30 tablet  5  . spironolactone (ALDACTONE) 12.5 mg TABS Take 0.5 tablets (12.5 mg total) by mouth daily.  30 tablet  5  . warfarin (COUMADIN) 5 MG tablet Take 1 tablet (5 mg total) by mouth daily.  30 tablet  11    HOSPITAL MEDICATIONS: I have reviewed the patient's current medications.  VITALS: Blood pressure 104/56, pulse 78, temperature 98.7 F (37.1 C), temperature source Oral, resp. rate 20, height 5\' 6"  (1.676 m), weight 72.8 kg (160 lb 7.9 oz), SpO2 97.00%.  PHYSICAL EXAM: General appearance: alert, cooperative, no distress and pale Neck: no carotid bruit and no JVD Lungs: bibasilar rhonchi Heart: regular rate and rhythm Abdomen: soft, non-tender; bowel sounds normal; no masses,  no organomegaly Extremities: some redness Rt  foot but not hot. Pulses:decreased distal pulses Skin: cool and dry Neurologic: Grossly normal  LABS: Results for orders placed during the hospital encounter of 07/11/12 (from the past 48 hour(s))  PROTIME-INR     Status: Abnormal   Collection Time   07/11/12  7:43 PM      Component Value Range Comment   Prothrombin Time 26.1 (*) 11.6 - 15.2 seconds    INR 2.54 (*) 0.00 - 1.49   APTT     Status: Abnormal   Collection Time   07/11/12  7:43 PM      Component Value Range Comment   aPTT 59 (*) 24 - 37 seconds   CBC WITH DIFFERENTIAL     Status: Abnormal   Collection Time   07/11/12  7:43 PM      Component Value Range Comment   WBC 8.9  4.0 - 10.5 K/uL    RBC 3.62 (*) 3.87 - 5.11 MIL/uL    Hemoglobin 9.6 (*) 12.0 - 15.0 g/dL    HCT 16.1 (*) 09.6 - 46.0 %    MCV 80.1  78.0 - 100.0 fL    MCH 26.5  26.0 - 34.0 pg    MCHC 33.1  30.0 - 36.0 g/dL    RDW 04.5 (*) 40.9 - 15.5 %    Platelets 386  150 - 400 K/uL    Neutrophils Relative 68  43 - 77 %    Neutro Abs 6.1  1.7 - 7.7 K/uL    Lymphocytes Relative 21  12 - 46 %    Lymphs Abs 1.9  0.7 - 4.0 K/uL    Monocytes Relative 9  3 - 12 %    Monocytes Absolute 0.8  0.1 - 1.0 K/uL    Eosinophils Relative 2  0 - 5 %    Eosinophils Absolute 0.2  0.0 - 0.7 K/uL    Basophils Relative 0  0 - 1 %    Basophils Absolute 0.0  0.0 - 0.1 K/uL   BASIC METABOLIC PANEL     Status: Abnormal   Collection Time   07/11/12  7:43 PM      Component Value Range Comment   Sodium 133 (*) 135 - 145 mEq/L    Potassium 4.2  3.5 - 5.1 mEq/L    Chloride 97  96 - 112 mEq/L    CO2 26  19 - 32 mEq/L    Glucose, Bld 199 (*) 70 - 99 mg/dL    BUN 27 (*) 6 - 23 mg/dL    Creatinine, Ser 8.11 (*) 0.50 - 1.10 mg/dL    Calcium 91.4 (*) 8.4 - 10.5 mg/dL    GFR  calc non Af Amer 48 (*) >90 mL/min    GFR calc Af Amer 55 (*) >90 mL/min   PRO B NATRIURETIC PEPTIDE     Status: Abnormal   Collection Time   07/11/12  8:01 PM      Component Value Range Comment   Pro B  Natriuretic peptide (BNP) 10853.0 (*) 0 - 125 pg/mL   POCT I-STAT TROPONIN I     Status: Normal   Collection Time   07/11/12  8:04 PM      Component Value Range Comment   Troponin i, poc 0.01  0.00 - 0.08 ng/mL    Comment 3            GLUCOSE, CAPILLARY     Status: Abnormal   Collection Time   07/11/12  8:09 PM      Component Value Range Comment   Glucose-Capillary 179 (*) 70 - 99 mg/dL   TROPONIN I     Status: Normal   Collection Time   07/12/12  1:39 AM      Component Value Range Comment   Troponin I <0.30  <0.30 ng/mL   COMPREHENSIVE METABOLIC PANEL     Status: Abnormal   Collection Time   07/12/12  1:39 AM      Component Value Range Comment   Sodium 134 (*) 135 - 145 mEq/L    Potassium 4.4  3.5 - 5.1 mEq/L    Chloride 99  96 - 112 mEq/L    CO2 24  19 - 32 mEq/L    Glucose, Bld 240 (*) 70 - 99 mg/dL    BUN 26 (*) 6 - 23 mg/dL    Creatinine, Ser 1.61 (*) 0.50 - 1.10 mg/dL    Calcium 09.6 (*) 8.4 - 10.5 mg/dL    Total Protein 6.9  6.0 - 8.3 g/dL    Albumin 2.6 (*) 3.5 - 5.2 g/dL    AST 14  0 - 37 U/L    ALT 16  0 - 35 U/L    Alkaline Phosphatase 199 (*) 39 - 117 U/L    Total Bilirubin 0.4  0.3 - 1.2 mg/dL    GFR calc non Af Amer 50 (*) >90 mL/min    GFR calc Af Amer 58 (*) >90 mL/min   CBC WITH DIFFERENTIAL     Status: Abnormal   Collection Time   07/12/12  1:39 AM      Component Value Range Comment   WBC 8.1  4.0 - 10.5 K/uL    RBC 3.55 (*) 3.87 - 5.11 MIL/uL    Hemoglobin 8.9 (*) 12.0 - 15.0 g/dL    HCT 04.5 (*) 40.9 - 46.0 %    MCV 79.2  78.0 - 100.0 fL    MCH 25.1 (*) 26.0 - 34.0 pg    MCHC 31.7  30.0 - 36.0 g/dL    RDW 81.1  91.4 - 78.2 %    Platelets 364  150 - 400 K/uL    Neutrophils Relative 72  43 - 77 %    Neutro Abs 5.8  1.7 - 7.7 K/uL    Lymphocytes Relative 18  12 - 46 %    Lymphs Abs 1.5  0.7 - 4.0 K/uL    Monocytes Relative 8  3 - 12 %    Monocytes Absolute 0.7  0.1 - 1.0 K/uL    Eosinophils Relative 1  0 - 5 %    Eosinophils Absolute 0.1  0.0  - 0.7 K/uL  Basophils Relative 0  0 - 1 %    Basophils Absolute 0.0  0.0 - 0.1 K/uL   TSH     Status: Normal   Collection Time   07/12/12  1:39 AM      Component Value Range Comment   TSH 3.371  0.350 - 4.500 uIU/mL   GLUCOSE, CAPILLARY     Status: Abnormal   Collection Time   07/12/12  6:09 AM      Component Value Range Comment   Glucose-Capillary 179 (*) 70 - 99 mg/dL    Comment 1 Notify RN      Comment 2 Documented in Chart     PROTIME-INR     Status: Abnormal   Collection Time   07/12/12  6:55 AM      Component Value Range Comment   Prothrombin Time 27.0 (*) 11.6 - 15.2 seconds    INR 2.65 (*) 0.00 - 1.49   TROPONIN I     Status: Normal   Collection Time   07/12/12  7:51 AM      Component Value Range Comment   Troponin I <0.30  <0.30 ng/mL   PRO B NATRIURETIC PEPTIDE     Status: Abnormal   Collection Time   07/12/12  7:51 AM      Component Value Range Comment   Pro B Natriuretic peptide (BNP) 11428.0 (*) 0 - 125 pg/mL   GLUCOSE, CAPILLARY     Status: Abnormal   Collection Time   07/12/12 11:20 AM      Component Value Range Comment   Glucose-Capillary 127 (*) 70 - 99 mg/dL    Comment 1 Notify RN     TROPONIN I     Status: Normal   Collection Time   07/12/12  1:34 PM      Component Value Range Comment   Troponin I <0.30  <0.30 ng/mL     IMAGING: Dg Chest 2 View  07/11/2012  *RADIOLOGY REPORT*  Clinical Data: Shortness of breath.  CHEST - 2 VIEW  Comparison: 06/27/2012.  Findings: The heart is borderline enlarged but stable. Overall improved lung aeration.  Mild vascular congestion but no interstitial edema.  There are persistent bilateral lower lobe infiltrates and/or atelectasis but resolving pleural effusions.  IMPRESSION: Persistent bibasilar atelectasis or infiltrates but resolving pleural effusions. Overall improved aeration with mild vascular congestion.   Original Report Authenticated By: Rudie Meyer, M.D.     IMPRESSION: Principal Problem:  *Acute on  chronic combined systolic and diastolic HF (heart failure), NYHA class 3 - exacerbated by Afib RVR Active Problems:  Ischemic cardiomyopathy, EF 25%  CAD, 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 We will take on our service but would appreciate [facility] follow for her medical issues- diabetes, cellulitis, DVT.  She may not be able to wait for CABG.  Time Spent Directly with Patient: 45 minutes  Abelino Derrick, PA 07/12/2012, 11:34  ATTENDING ATTESTATION:  I have seen and examined the patient along with Corine Shelter, PA.  I have reviewed the chart, notes and new data.  I agree with Luke's findings, examination & recommendations as noted above.  Brief Description:  66 y/o woman with recently diagnosed Severe Ischemic CM with poorly controlled DM -- pending staged CABG for multivessel CAD.   She felt better upon d/c a few days ago, but over the weekend, simply started "feeling sick".  Poor appetite, weak & fatigued.  Also, has noted L sided CP with SOB over the past few evenings.   Key examination changes: bibasilar rales, split S2 with ? S4; R foot erythematous, but warm.   Key new findings / data: BNP noted.  Afib-RVR in ER - now NSR with LBBB.  PLAN:  IV Lasix for diuresis along with Aldactone.  Would not up-titrate BB yet - may be partially responsible for fatigue.  ? If nausea could be related to Metformin.  I am concerned re: ~CP wit SOB -- will add long acting nitrate.  BP is borderline - not yet able to use ACE-I (but may be able to address this admission as renal Fxn stable).  If she has persistent SSx c/w angina, I agree  that we may need to readdress timing of CABG.  Consider RHC if no change in Sx with initial Rx as clinical exam is difficult to determine extent of volume overload.  Appreciate [facility] counselor assistance with DM care.  I think Lantus will not work @ home -- too expensive.  Marykay Lex, M.D., M.S. [facility] [address]. [address] [place], [place]  [address]  [phone]  07/12/2012 4:05 PM

## 2012-07-12 NOTE — Progress Notes (Signed)
[facility] PROGRESS NOTE  [name] [name] ZOX:[id number] DOB: 18-Mar-1946 DOA: 07/11/2012 PCP: No primary provider on file.  HPI 66 y.o. female just discharged after being hospitalized 06/21/12-07/09/12. We saw her in consult. She has severe 3V CAD and an ischemic cardiomyopathy as well as recent Rt LE DVT, and Rt popliteal artery occlusion. She was not felt to be a candidate at this time for CABG and the plan was to treat her multiple medical issues and then plan elective CABG at a later date. She was discjharged on Coumadin. She presented late 07/11/12 with SOB, her was BNP 10K and she also had PAF in the ER (NSR now).   Assessment/Plan: 1. Acute on chronic systolic heart failure: Secondary to ischemic cardiomyopathy. Clinically improving after diuresis-continue IV Lasix and oral Aldactone. Cardiology was consulted and accepted patient onto their service and the hospitalist service will follow in consult for other medical problem management. 2. Right lower extremity DVT and right popliteal artery occlusion: Continue anticoagulation-Lovenox and Coumadin per pharmacy. INR therapeutic at 2.65-anticoagulated. 3. Type 2 diabetes mellitus: Reasonable inpatient control. Continue Lantus and sliding scale insulin. 4. Anemia: Fairly stable. Follow CBC daily. No active bleeding.  5. Recent cellulitis of right lower extremity: Resolved. 6. Hypercalcemia secondary to hyperparathyroidism: Monitor calcium closely. Corrected calcium 12.1. Asymptomatic. IV Lasix should help. 7. 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 Follow BMP daily. 8. 3V CAD: ? CABG during this admission. Per Cards.  Code Status: Full Family Communication: Discussed with patient Disposition Plan:  Home in medically stable   Consultants:  Cardiology  Procedures:  None  Antibiotics:  None  HPI/Subjective: Dyspnea is better than on admission. Epigastric burning. Denies chest pain.  Objective: Filed  Vitals:   07/11/12 2327 07/12/12 0030 07/12/12 0606 07/12/12 1350  BP:  121/65 100/60 104/56  Pulse:  80 72 78  Temp: 97.9 F (36.6 C) 99.2 F (37.3 C) 98.6 F (37 C) 98.7 F (37.1 C)  TempSrc: Oral Oral Oral Oral  Resp:  18 18 20   Height:  5\' 6"  (1.676 m)    Weight:  72.8 kg (160 lb 7.9 oz)    SpO2:  97% 100% 97%    Intake/Output Summary (Last 24 hours) at 07/12/12 1532 Last data filed at 07/12/12 1300  Gross per 24 hour  Intake    960 ml  Output   1450 ml  Net   -490 ml   Filed Weights   07/11/12 1911 07/12/12 0030  Weight: 73.936 kg (163 lb) 72.8 kg (160 lb 7.9 oz)    Exam:   General exam: Appears comfortable.  Respiratory system: Reduced breath sounds in bases with few basal crackles. Rest of lung fields are clear. No increased work of breathing.  Cardiovascular system: S1 and S2 heard, regular rate and rhythm. No JVD or murmurs. Telemetry shows sinus rhythm. 1+ bilateral pitting edema.  Gastrointestinal system: Abdomen is nondistended, soft and nontender. Normal bowel sounds heard.  Central nervous system: Alert and oriented. No focal neurological deficits.  Extremities: Symmetric 5 x 5 power. No acute findings.    Data Reviewed: Basic Metabolic Panel:  Lab 07/12/12 8119 07/11/12 1943  NA 134* 133*  K 4.4 4.2  CL 99 97  CO2 24 26  GLUCOSE 240* 199*  BUN 26* 27*  CREATININE 1.12* 1.17*  CALCIUM 11.0* 11.3*  MG -- --  PHOS -- --   Liver Function Tests:  Lab 07/12/12 0139  AST 14  ALT 16  ALKPHOS 199*  BILITOT 0.4  PROT 6.9  ALBUMIN 2.6*   No results found for this basename: LIPASE:5,AMYLASE:5 in the last 168 hours No results found for this basename: AMMONIA:5 in the last 168 hours CBC:  Lab 07/12/12 0139 07/11/12 1943 07/09/12 0519 07/08/12 0530 07/07/12 0600  WBC 8.1 8.9 9.6 10.3 11.6*  NEUTROABS 5.8 6.1 -- -- --  HGB 8.9* 9.6* 9.2* 9.1* 9.4*  HCT 28.1* 29.0* 29.4* 28.9* 29.4*  MCV 79.2 80.1 81.2 80.7 80.8  PLT 364 386 321 308 301    Cardiac Enzymes:  Lab 07/12/12 1334 07/12/12 0751 07/12/12 0139  CKTOTAL -- -- --  CKMB -- -- --  CKMBINDEX -- -- --  TROPONINI <0.30 <0.30 <0.30   BNP (last 3 results)  Basename 07/12/12 0751 07/11/12 2001 07/05/12 0421  PROBNP 11428.0* 10853.0* 6853.0*   CBG:  Lab 07/12/12 1120 07/12/12 0609 07/11/12 2009 07/09/12 1050 07/09/12 0706  GLUCAP 127* 179* 179* 185* 136*    Recent Results (from the past 240 hour(s))  SURGICAL PCR SCREEN     Status: Normal   Collection Time   07/06/12  9:23 PM      Component Value Range Status Comment   MRSA, PCR NEGATIVE  NEGATIVE Final    Staphylococcus aureus NEGATIVE  NEGATIVE Final      Studies: Dg Chest 2 View  07/11/2012  *RADIOLOGY REPORT*  Clinical Data: Shortness of breath.  CHEST - 2 VIEW  Comparison: 06/27/2012.  Findings: The heart is borderline enlarged but stable. Overall improved lung aeration.  Mild vascular congestion but no interstitial edema.  There are persistent bilateral lower lobe infiltrates and/or atelectasis but resolving pleural effusions.  IMPRESSION: Persistent bibasilar atelectasis or infiltrates but resolving pleural effusions. Overall improved aeration with mild vascular congestion.   Original Report Authenticated By: Rudie Meyer, M.D.     Scheduled Meds:    . aspirin EC  81 mg Oral Daily  . atorvastatin  10 mg Oral q1800  . [COMPLETED] enoxaparin (LOVENOX) injection  70 mg Subcutaneous Once  . feeding supplement  237 mL Oral BID BM  . furosemide  40 mg Intravenous Daily  . insulin aspart  0-9 Units Subcutaneous TID WC  . insulin glargine  20 Units Subcutaneous QHS  . metoprolol succinate  25 mg Oral Daily  . pantoprazole  40 mg Oral Q0600  . [COMPLETED] sodium chloride  500 mL Intravenous Once  . sodium chloride  3 mL Intravenous Q12H  . sodium chloride  3 mL Intravenous Q12H  . spironolactone  12.5 mg Oral Daily  . warfarin  5 mg Oral q1800  . 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 She needs CABG  Chronic anticoagulation, discharged on Coumadin 07/09/12  PAF, on admission 07/12/12    Time spent: 30 minutes    [facility]  [facility] Pager [phone] . If 8PM-8AM, please contact night-coverage at [email or web address], password [facility] 07/12/2012, 3:32 PM  LOS: 1 day

## 2012-07-12 NOTE — Progress Notes (Signed)
ANTICOAGULATION CONSULT NOTE - Follow Up Consult  Pharmacy Consult for Coumadin Indication: DVT  Allergies  Allergen Reactions  . Penicillins Anaphylaxis  . Sulfa Antibiotics Anaphylaxis    Patient Measurements: Height: 5\' 6"  (167.6 cm) Weight: 160 lb 7.9 oz (72.8 kg) (scale B) IBW/kg (Calculated) : 59.3  Heparin Dosing Weight:    Vital Signs: Temp: 98.6 F (37 C) (11/19 0606) Temp src: Oral (11/19 0606) BP: 100/60 mmHg (11/19 0606) Pulse Rate: 72  (11/19 0606)  Labs:  Basename 07/12/12 0655 07/12/12 0139 07/11/12 1943  HGB -- 8.9* 9.6*  HCT -- 28.1* 29.0*  PLT -- 364 386  APTT -- -- 59*  LABPROT 27.0* -- 26.1*  INR 2.65* -- 2.54*  HEPARINUNFRC -- -- --  CREATININE -- 1.12* 1.17*  CKTOTAL -- -- --  CKMB -- -- --  TROPONINI -- <0.30 --    Estimated Creatinine Clearance: 51.1 ml/min (by C-G formula based on Cr of 1.12).  Assessment: SOB (recent discharge 11/16) = CHF  Anticoagulation Lovenox/Coumain for recent DVT of lower extremities with R popliteal artery thrombus. INR 2.65 in goal.Home Coumadin regimen 5 mg daily  Infectious Disease: Tmax 99.2  Cardiovascular CHF, PVD, CAD, needing CABG, EF 25%--delayed x 1 month due to DVT/cellulitis/DM. VSS. Meds: IV Lasix, ASA 81mg , Lipitor, Toprol, spironolactone  Endocrinology DM on SSI and Lantus with CBG 179 this am.  Gastrointestinal / Nutrition: Protonix  Nephrology: CKD Scr 1.12.  Hematology / Oncology: Anemic  Goal of Therapy:  INR 2-3 Monitor platelets by anticoagulation protocol: Yes   Plan:  Continue home Coumadin 5mg /day Daily PT/INR  Misty Stanley Stillinger 07/12/2012,10:17 AM

## 2012-07-12 NOTE — Progress Notes (Signed)
UR Chart Review Completed  

## 2012-07-12 NOTE — Progress Notes (Signed)
ANTICOAGULATION CONSULT NOTE - Initial Consult  Pharmacy Consult for Lovenox/Coumadin Indication: DVT  Allergies  Allergen Reactions  . Penicillins Anaphylaxis  . Sulfa Antibiotics Anaphylaxis    Patient Measurements: Height: 5\' 6"  (167.6 cm) Weight: 160 lb 7.9 oz (72.8 kg) (scale B) IBW/kg (Calculated) : 59.3   Vital Signs: Temp: 99.2 F (37.3 C) (11/19 0030) Temp src: Oral (11/19 0030) BP: 121/65 mmHg (11/19 0030) Pulse Rate: 80  (11/19 0030)  Labs:  Basename 07/11/12 1943 07/09/12 0519  HGB 9.6* 9.2*  HCT 29.0* 29.4*  PLT 386 321  APTT 59* --  LABPROT 26.1* 21.3*  INR 2.54* 1.93*  HEPARINUNFRC -- --  CREATININE 1.17* --  CKTOTAL -- --  CKMB -- --  TROPONINI -- --    Estimated Creatinine Clearance: 49 ml/min (by C-G formula based on Cr of 1.17).   Medical History: Past Medical History  Diagnosis Date  . Cardiomyopathy, EF 25% 06/29/2012  . Type II diabetes mellitus   . Coronary artery disease     Medications:  Prescriptions prior to admission  Medication Sig Dispense Refill  . acetaminophen (TYLENOL) 325 MG tablet Take 2 tablets (650 mg total) by mouth every 4 (four) hours as needed.      Marland Kitchen aspirin EC 81 MG EC tablet Take 1 tablet (81 mg total) by mouth daily.      Marland Kitchen atorvastatin (LIPITOR) 10 MG tablet Take 1 tablet (10 mg total) by mouth daily at 6 PM.  30 tablet  5  . enoxaparin (LOVENOX) 150 MG/ML injection Inject 0.49 mLs (75 mg total) into the skin every 12 (twelve) hours.  3 Syringe  0  . feeding supplement (ENSURE COMPLETE) LIQD Take 237 mLs by mouth 2 (two) times daily between meals.      . furosemide (LASIX) 40 MG tablet Take 1 tablet (40 mg total) by mouth daily.  30 tablet  5  . insulin glargine (LANTUS) 100 UNIT/ML injection Inject 20 Units into the skin at bedtime.  10 mL  5  . living well with diabetes book MISC 1 each by Does not apply route once.      Marland Kitchen LORazepam (ATIVAN) 0.5 MG tablet Take 0.5 tablets (0.25 mg total) by mouth every 12  (twelve) hours as needed for anxiety.  30 tablet  0  . metFORMIN (GLUCOPHAGE) 500 MG tablet Take 1 tablet (500 mg total) by mouth 2 (two) times daily with a meal.  60 tablet  5  . metoprolol succinate (TOPROL-XL) 25 MG 24 hr tablet Take 1 tablet (25 mg total) by mouth daily.  30 tablet  5  . Multiple Vitamin (MULTIVITAMIN) LIQD Take 5 mLs by mouth daily.      . pantoprazole (PROTONIX) 40 MG tablet Take 1 tablet (40 mg total) by mouth daily at 6 (six) AM.  30 tablet  5  . spironolactone (ALDACTONE) 12.5 mg TABS Take 0.5 tablets (12.5 mg total) by mouth daily.  30 tablet  5  . warfarin (COUMADIN) 5 MG tablet Take 1 tablet (5 mg total) by mouth daily.  30 tablet  11    Assessment: 66 yo female with recent DVT for anticoagulation  Goal of Therapy:  Full anticoagulation with Lovenox INR 2-3 Monitor platelets by anticoagulation protocol: Yes   Plan:  Lovenox 70 mg SQ now.   Will confirm INR > 2 again in AM for completed bridge and continue Coumadin.  Kriya Westra, Gary Fleet 07/12/2012,1:45 AM

## 2012-07-13 DIAGNOSIS — N289 Disorder of kidney and ureter, unspecified: Secondary | ICD-10-CM

## 2012-07-13 DIAGNOSIS — I5021 Acute systolic (congestive) heart failure: Secondary | ICD-10-CM

## 2012-07-13 LAB — GLUCOSE, CAPILLARY
Glucose-Capillary: 130 mg/dL — ABNORMAL HIGH (ref 70–99)
Glucose-Capillary: 130 mg/dL — ABNORMAL HIGH (ref 70–99)
Glucose-Capillary: 162 mg/dL — ABNORMAL HIGH (ref 70–99)

## 2012-07-13 LAB — PRO B NATRIURETIC PEPTIDE: Pro B Natriuretic peptide (BNP): 8924 pg/mL — ABNORMAL HIGH (ref 0–125)

## 2012-07-13 LAB — PROTIME-INR
INR: 2.23 — ABNORMAL HIGH (ref 0.00–1.49)
Prothrombin Time: 23.7 seconds — ABNORMAL HIGH (ref 11.6–15.2)

## 2012-07-13 LAB — BASIC METABOLIC PANEL
BUN: 25 mg/dL — ABNORMAL HIGH (ref 6–23)
CO2: 27 mEq/L (ref 19–32)
Calcium: 11.2 mg/dL — ABNORMAL HIGH (ref 8.4–10.5)
Chloride: 101 mEq/L (ref 96–112)
Creatinine, Ser: 1.24 mg/dL — ABNORMAL HIGH (ref 0.50–1.10)
Glucose, Bld: 135 mg/dL — ABNORMAL HIGH (ref 70–99)

## 2012-07-13 MED ORDER — RANOLAZINE ER 500 MG PO TB12
500.0000 mg | ORAL_TABLET | Freq: Two times a day (BID) | ORAL | Status: DC
  Administered 2012-07-13 – 2012-07-25 (×24): 500 mg via ORAL
  Filled 2012-07-13 (×25): qty 1

## 2012-07-13 MED ORDER — SPIRONOLACTONE 12.5 MG HALF TABLET
12.5000 mg | ORAL_TABLET | Freq: Two times a day (BID) | ORAL | Status: DC
  Administered 2012-07-13 – 2012-07-16 (×6): 12.5 mg via ORAL
  Filled 2012-07-13 (×10): qty 1

## 2012-07-13 MED ORDER — INSULIN ASPART PROT & ASPART (70-30 MIX) 100 UNIT/ML ~~LOC~~ SUSP
12.0000 [IU] | Freq: Two times a day (BID) | SUBCUTANEOUS | Status: DC
  Administered 2012-07-13 – 2012-07-22 (×19): 12 [IU] via SUBCUTANEOUS
  Filled 2012-07-13 (×2): qty 3

## 2012-07-13 NOTE — Progress Notes (Signed)
ANTICOAGULATION CONSULT NOTE - Follow Up Consult  Pharmacy Consult for Coumadin Indication: DVT  Allergies  Allergen Reactions  . Penicillins Anaphylaxis  . Sulfa Antibiotics Anaphylaxis    Patient Measurements: Height: 5\' 6"  (167.6 cm) Weight: 157 lb 10.1 oz (71.5 kg) IBW/kg (Calculated) : 59.3  Heparin Dosing Weight:    Vital Signs: Temp: 97.5 F (36.4 C) (11/20 0514) Temp src: Oral (11/20 0514) BP: 112/65 mmHg (11/20 0514) Pulse Rate: 71  (11/20 0514)  Labs:  Basename 07/13/12 0450 07/12/12 1334 07/12/12 0751 07/12/12 0655 07/12/12 0139 07/11/12 1943  HGB -- -- -- -- 8.9* 9.6*  HCT -- -- -- -- 28.1* 29.0*  PLT -- -- -- -- 364 386  APTT -- -- -- -- -- 59*  LABPROT 23.7* -- -- 27.0* -- 26.1*  INR 2.23* -- -- 2.65* -- 2.54*  HEPARINUNFRC -- -- -- -- -- --  CREATININE 1.24* -- -- -- 1.12* 1.17*  CKTOTAL -- -- -- -- -- --  CKMB -- -- -- -- -- --  TROPONINI -- <0.30 <0.30 -- <0.30 --    Estimated Creatinine Clearance: 45.8 ml/min (by C-G formula based on Cr of 1.24).  Assessment: SOB (recent discharge 11/16) = CHF  Anticoagulation Lovenox/Coumain for recent DVT of lower extremities with R popliteal artery thrombus. INR 2.23 in goal.Home Coumadin regimen 5 mg daily  Goal of Therapy:  INR 2-3 Monitor platelets by anticoagulation protocol: Yes   Plan:  Continue home Coumadin 5mg /day Daily PT/INR  [name] [name] 07/13/2012,9:52 AM

## 2012-07-13 NOTE — ED Provider Notes (Signed)
Medical screening examination/treatment/procedure(s) were performed by non-physician practitioner and as supervising physician I was immediately available for consultation/collaboration.  Cyd Hostler T Fanny Agan, MD 07/13/12 2241 

## 2012-07-13 NOTE — Progress Notes (Signed)
Subjective:  Still some DOE  Objective:  Vital Signs in the last 24 hours: Temp:  [97.5 F (36.4 C)-98.7 F (37.1 C)] 97.5 F (36.4 C) (11/20 0514) Pulse Rate:  [71-78] 71  (11/20 0514) Resp:  [16-20] 16  (11/20 0514) BP: (92-112)/(54-65) 112/65 mmHg (11/20 0514) SpO2:  [94 %-97 %] 97 % (11/20 0514) Weight:  [71.5 kg (157 lb 10.1 oz)] 71.5 kg (157 lb 10.1 oz) (11/20 0500)  Intake/Output from previous day:  Intake/Output Summary (Last 24 hours) at 07/13/12 1034 Last data filed at 07/13/12 0902  Gross per 24 hour  Intake   1083 ml  Output   1951 ml  Net   -868 ml      . aspirin EC  81 mg Oral Daily  . atorvastatin  10 mg Oral q1800  . feeding supplement  237 mL Oral BID BM  . furosemide  40 mg Intravenous Daily  . insulin aspart  0-9 Units Subcutaneous TID WC  . insulin aspart protamine-insulin aspart  12 Units Subcutaneous BID WC  . isosorbide mononitrate  30 mg Oral Daily  . metoprolol succinate  25 mg Oral Daily  . pantoprazole  40 mg Oral Q0600  . sodium chloride  3 mL Intravenous Q12H  . sodium chloride  3 mL Intravenous Q12H  . spironolactone  12.5 mg Oral Daily  . warfarin  5 mg Oral q1800  . Warfarin - Pharmacist Dosing Inpatient   Does not apply q1800  . [DISCONTINUED] insulin glargine  20 Units Subcutaneous QHS      Physical Exam: General appearance: alert, cooperative and no distress JVD 7-8 cm Lungs: bi basilar rales Heart: regular rate and rhythm Abd; soft Edema improved.   Rate: 70  Rhythm: normal sinus rhythm  Lab Results:  Basename 07/12/12 0139 07/11/12 1943  WBC 8.1 8.9  HGB 8.9* 9.6*  PLT 364 386    Basename 07/13/12 0450 07/12/12 0139  NA 135 134*  K 4.3 4.4  CL 101 99  CO2 27 24  GLUCOSE 135* 240*  BUN 25* 26*  CREATININE 1.24* 1.12*    Basename 07/12/12 1334 07/12/12 0751  TROPONINI <0.30 <0.30   Hepatic Function Panel  Basename 07/12/12 0139  PROT 6.9  ALBUMIN 2.6*  AST 14  ALT 16  ALKPHOS 199*  BILITOT 0.4    BILIDIR --  IBILI --   No results found for this basename: CHOL in the last 72 hours  Basename 07/13/12 0450  INR 2.23*    Imaging: Imaging results have been reviewed  Cardiac Studies:  Assessment/Plan:   Principal Problem:  *Acute on chronic combined systolic and diastolic HF (heart failure), NYHA class 3 - exacerbated by Afib RVR Active Problems:  Acute systolic CHF, just discharged 16/10/96  CAD, 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 Continue IV Lasix.   Corine Shelter PA-C 07/13/2012, 10:34 AM   Patient seen and examined. Agree with assessment and plan. With multivessel CAD and med titration will add ranolazine 500 mg bid to regimen, and titrate spironolactone to bid.   Lennette Bihari, MD, [facility] 07/13/2012 2:33 PM

## 2012-07-13 NOTE — Progress Notes (Signed)
[facility] PROGRESS NOTE  [name] [name] ZOX:[id number] DOB: [birth year] DOA: 07/11/2012 PCP: No primary provider on file.  HPI 66 y.o. female just discharged after being hospitalized 06/21/12-07/09/12. We saw her in consult. She has severe 3V CAD and an ischemic cardiomyopathy as well as recent Rt LE DVT, and Rt popliteal artery occlusion. She was not felt to be a candidate at this time for CABG and the plan was to treat her multiple medical issues and then plan elective CABG at a later date. She was discjharged on Coumadin. She presented late 07/11/12 with SOB, her was BNP 10K and she also had PAF in the ER (NSR now).   Assessment/Plan: 1. Acute on chronic systolic heart failure: Secondary to ischemic cardiomyopathy. Clinically improving after diuresis-continue IV Lasix and oral Aldactone. Cardiology was consulted and following, 3 vessel CAD timing of CABG per CARDs 2. Right lower extremity DVT and right popliteal artery occlusion: Continue anticoagulation-Coumadin per pharmacy. INR therapeutic at 2.65-anticoagulated. 3. Type 2 diabetes mellitus: was not able to afford lantus, will transition to Insulin 70/30 with meals, ssi and  metformin at DC 4. Anemia: Fairly stable. Follow CBC daily. No active bleeding.  5. Recent cellulitis of right lower extremity: Resolved. 6. Hypercalcemia possibly secondary to hyperparathyroidism: will check PTH, monitor Ca . Asymptomatic. IV Lasix should help. 7. 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 Follow BMP daily.  Code Status: Full Family Communication: Discussed with patient Disposition Plan:  Home when medically stable   Consultants:  Cardiology  Procedures:  None  Antibiotics:  None  HPI/Subjective: Dyspnea is better, swelling improved significantly,  Frustrated/angry about cold breakfast. Denies chest pain.  Objective: Filed Vitals:   07/12/12 1350 07/12/12 2105 07/13/12 0500 07/13/12 0514  BP: 104/56 92/54   112/65  Pulse: 78 75  71  Temp: 98.7 F (37.1 C) 98.1 F (36.7 C)  97.5 F (36.4 C)  TempSrc: Oral Oral  Oral  Resp: 20 18  16   Height:      Weight:   71.5 kg (157 lb 10.1 oz)   SpO2: 97% 94%  97%    Intake/Output Summary (Last 24 hours) at 07/13/12 0852 Last data filed at 07/13/12 0426  Gross per 24 hour  Intake   1323 ml  Output   2401 ml  Net  -1078 ml   Filed Weights   07/11/12 1911 07/12/12 0030 07/13/12 0500  Weight: 73.936 kg (163 lb) 72.8 kg (160 lb 7.9 oz) 71.5 kg (157 lb 10.1 oz)    Exam:   General exam: Appears comfortable.  Respiratory system: with bibasilar crackles. No increased work of breathing.  Cardiovascular system: S1 and S2 heard, regular rate and rhythm. No JVD or murmurs. Telemetry shows sinus rhythm. 1+ bilateral pitting edema.  Gastrointestinal system: Abdomen is nondistended, soft and nontender. Normal bowel sounds heard.  Central nervous system: Alert and oriented. No focal neurological deficits.  Extremities: Symmetric 5 x 5 power. Improved swelling in both lower est, erythema R foot   Data Reviewed: Basic Metabolic Panel:  Lab 07/13/12 8119 07/12/12 0139 07/11/12 1943  NA 135 134* 133*  K 4.3 4.4 4.2  CL 101 99 97  CO2 27 24 26   GLUCOSE 135* 240* 199*  BUN 25* 26* 27*  CREATININE 1.24* 1.12* 1.17*  CALCIUM 11.2* 11.0* 11.3*  MG -- -- --  PHOS -- -- --   Liver Function Tests:  Lab 07/12/12 0139  AST 14  ALT 16  ALKPHOS 199*  BILITOT  0.4  PROT 6.9  ALBUMIN 2.6*   No results found for this basename: LIPASE:5,AMYLASE:5 in the last 168 hours No results found for this basename: AMMONIA:5 in the last 168 hours CBC:  Lab 07/12/12 0139 07/11/12 1943 07/09/12 0519 07/08/12 0530 07/07/12 0600  WBC 8.1 8.9 9.6 10.3 11.6*  NEUTROABS 5.8 6.1 -- -- --  HGB 8.9* 9.6* 9.2* 9.1* 9.4*  HCT 28.1* 29.0* 29.4* 28.9* 29.4*  MCV 79.2 80.1 81.2 80.7 80.8  PLT 364 386 321 308 301   Cardiac Enzymes:  Lab 07/12/12 1334 07/12/12 0751  07/12/12 0139  CKTOTAL -- -- --  CKMB -- -- --  CKMBINDEX -- -- --  TROPONINI <0.30 <0.30 <0.30   BNP (last 3 results)  Basename 07/13/12 0450 07/12/12 0751 07/11/12 2001  PROBNP 8924.0* 11428.0* 10853.0*   CBG:  Lab 07/13/12 0631 07/12/12 2102 07/12/12 1556 07/12/12 1120 07/12/12 0609  GLUCAP 130* 131* 162* 127* 179*    Recent Results (from the past 240 hour(s))  SURGICAL PCR SCREEN     Status: Normal   Collection Time   07/06/12  9:23 PM      Component Value Range Status Comment   MRSA, PCR NEGATIVE  NEGATIVE Final    Staphylococcus aureus NEGATIVE  NEGATIVE Final      Studies: Dg Chest 2 View  07/11/2012  *RADIOLOGY REPORT*  Clinical Data: Shortness of breath.  CHEST - 2 VIEW  Comparison: 06/27/2012.  Findings: The heart is borderline enlarged but stable. Overall improved lung aeration.  Mild vascular congestion but no interstitial edema.  There are persistent bilateral lower lobe infiltrates and/or atelectasis but resolving pleural effusions.  IMPRESSION: Persistent bibasilar atelectasis or infiltrates but resolving pleural effusions. Overall improved aeration with mild vascular congestion.   Original Report Authenticated By: Rudie Meyer, M.D.     Scheduled Meds:    . aspirin EC  81 mg Oral Daily  . atorvastatin  10 mg Oral q1800  . feeding supplement  237 mL Oral BID BM  . furosemide  40 mg Intravenous Daily  . insulin aspart  0-9 Units Subcutaneous TID WC  . insulin glargine  20 Units Subcutaneous QHS  . isosorbide mononitrate  30 mg Oral Daily  . metoprolol succinate  25 mg Oral Daily  . pantoprazole  40 mg Oral Q0600  . sodium chloride  3 mL Intravenous Q12H  . sodium chloride  3 mL Intravenous Q12H  . spironolactone  12.5 mg Oral Daily  . warfarin  5 mg Oral q1800  . 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 She needs CABG  Chronic anticoagulation, discharged on Coumadin 07/09/12  PAF, on admission 07/12/12  Anemia    Time spent: 30 minutes    [facility]  [facility] Pager [phone] . If 8PM-8AM, please contact night-coverage at [email or web address], password [facility] 07/13/2012, 8:52 AM  LOS: 2 days

## 2012-07-13 NOTE — Progress Notes (Signed)
Pharmacist Heart Failure Core Measure Documentation  Assessment: [name] [name] has an EF documented as 25% on 05/2012 by ECHO.  Rationale: Heart failure patients with left ventricular systolic dysfunction (LVSD) and an EF < 40% should be prescribed an angiotensin converting enzyme inhibitor (ACEI) or angiotensin receptor blocker (ARB) at discharge unless a contraindication is documented in the medical record.  This patient is not currently on an ACEI or ARB for HF.  This note is being placed in the record in order to provide documentation that a contraindication to the use of these agents is present for this encounter.  ACE Inhibitor or Angiotensin Receptor Blocker is contraindicated (specify all that apply)  []   ACEI allergy AND ARB allergy []   Angioedema []   Moderate or severe aortic stenosis []   Hyperkalemia []   Hypotension []   Renal artery stenosis [x]   Worsening renal function, preexisting renal disease or dysfunction   Woodfin Ganja 07/13/2012 11:07 AM

## 2012-07-13 NOTE — Progress Notes (Signed)
   CARE MANAGEMENT NOTE 07/13/2012  Patient:  [name] [name], [name] [name]   Account Number:  [id number]  Date Initiated:  07/13/2012  Documentation initiated by:  Darlyne Russian  Subjective/Objective Assessment:   Patient admitted with c/o dizzy and shortness of breath     Action/Plan:   Progression of care and discharge planning   Anticipated DC Date:     Anticipated DC Plan:        DC Planning Services  CM consult      Choice offered to / List presented to:             Status of service:  In process, will continue to follow Medicare Important Message given?   (If response is "NO", the following Medicare IM given date fields will be blank) Date Medicare IM given:   Date Additional Medicare IM given:    Discharge Disposition:    Per UR Regulation:    If discussed at Long Length of Stay Meetings, dates discussed:    Comments:  07/13/2012  1130 Darlyne Russian RN, [place]  [phone] CM referral: assistance with medication costs and need PCP  Met with patient to discuss discharge planning regarding medical follow up and medications. Patient does not have Medicare part B or D The patient had the discharge instructions  from 07/09/2012. She is to follow up with Dr Morton Peters [phone] and Dr Diona Fanti [phone].  She has not scheduled an appointment. She was not able to afford the medications at the pharmacy. She noted the insulin was the most expensive, the other medications are generic.  CM provided information from Needymeds.org and Lantus web site for assistance. Asked about a glucometer and strips to monitor blood sugars. She is able to afford the meter and strips. Reminded the patient to follow up with the MD office.

## 2012-07-14 LAB — CBC
MCH: 25.3 pg — ABNORMAL LOW (ref 26.0–34.0)
MCHC: 31.4 g/dL (ref 30.0–36.0)
MCV: 80.6 fL (ref 78.0–100.0)
Platelets: 386 10*3/uL (ref 150–400)
RBC: 3.56 MIL/uL — ABNORMAL LOW (ref 3.87–5.11)

## 2012-07-14 LAB — GLUCOSE, CAPILLARY
Glucose-Capillary: 122 mg/dL — ABNORMAL HIGH (ref 70–99)
Glucose-Capillary: 135 mg/dL — ABNORMAL HIGH (ref 70–99)

## 2012-07-14 LAB — PROTIME-INR: Prothrombin Time: 25.2 seconds — ABNORMAL HIGH (ref 11.6–15.2)

## 2012-07-14 LAB — PARATHYROID HORMONE, INTACT (NO CA): PTH: 128.2 pg/mL — ABNORMAL HIGH (ref 14.0–72.0)

## 2012-07-14 LAB — BASIC METABOLIC PANEL
CO2: 25 mEq/L (ref 19–32)
Calcium: 11.1 mg/dL — ABNORMAL HIGH (ref 8.4–10.5)
Creatinine, Ser: 1.22 mg/dL — ABNORMAL HIGH (ref 0.50–1.10)
GFR calc non Af Amer: 45 mL/min — ABNORMAL LOW (ref >90)
Sodium: 135 mEq/L (ref 135–145)

## 2012-07-14 MED ORDER — DOCUSATE SODIUM 100 MG PO CAPS
100.0000 mg | ORAL_CAPSULE | Freq: Two times a day (BID) | ORAL | Status: DC
  Administered 2012-07-14 – 2012-07-25 (×22): 100 mg via ORAL
  Filled 2012-07-14 (×25): qty 1

## 2012-07-14 MED ORDER — POLYETHYLENE GLYCOL 3350 17 G PO PACK
17.0000 g | PACK | Freq: Two times a day (BID) | ORAL | Status: DC
  Administered 2012-07-14 – 2012-07-23 (×10): 17 g via ORAL
  Filled 2012-07-14 (×24): qty 1

## 2012-07-14 NOTE — Progress Notes (Signed)
I cosign for Lindajo Royal RN assessment, notes, med administration, and I&O's.

## 2012-07-14 NOTE — Progress Notes (Signed)
[facility] PROGRESS NOTE  [name] [name] ZOX:[id number] DOB: February 21, 1946 DOA: 07/11/2012 PCP: No primary provider on file.  HPI 66 y.o. female just discharged after being hospitalized 06/21/12-07/09/12. We saw her in consult. She has severe 3V CAD and an ischemic cardiomyopathy as well as recent Rt LE DVT, and Rt popliteal artery occlusion. She was not felt to be a candidate at this time for CABG and the plan was to treat her multiple medical issues and then plan elective CABG at a later date. She was discjharged on Coumadin. She presented late 07/11/12 with SOB, her was BNP 10K and she also had PAF in the ER (NSR now).   Assessment/Plan: 1. Acute on chronic systolic heart failure: Secondary to ischemic cardiomyopathy. Clinically improving after diuresis-continue IV Lasix and oral Aldactone. Cardiology was consulted and following, 3 vessel CAD timing of CABG per CARDs 2. Right lower extremity DVT and right popliteal artery occlusion: Continue anticoagulation-Coumadin per pharmacy. INR therapeutic at 2.65-anticoagulated. 3. Type 2 diabetes mellitus: was not able to afford lantus, will transition to Insulin 70/30 with meals, ssi and  metformin at DC 4. Anemia: Fairly stable. Follow CBC daily. No active bleeding.  5. Recent cellulitis of right lower extremity: Resolved. 6. Hypercalcemia possibly secondary to hyperparathyroidism: will check PTH, monitor Ca . Asymptomatic. IV Lasix should help. 7. 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 Follow BMP daily.  Code Status: Full Family Communication: Discussed with patient Disposition Plan:  Home when medically stable   Consultants:  Cardiology  Procedures:  None  Antibiotics:  None  HPI/Subjective: Dyspnea is better, swelling improved significantly,  Frustrated/angry about cold breakfast. Denies chest pain.  Objective: Filed Vitals:   07/13/12 1655 07/13/12 2045 07/14/12 0532 07/14/12 0936  BP: 106/64 95/58  103/62 100/61  Pulse: 74 77 75 75  Temp:  98 F (36.7 C) 98.2 F (36.8 C) 97.9 F (36.6 C)  TempSrc:  Oral Oral Oral  Resp:  20 20 16   Height:      Weight:   72 kg (158 lb 11.7 oz)   SpO2:  94% 95% 93%    Intake/Output Summary (Last 24 hours) at 07/14/12 1347 Last data filed at 07/14/12 1317  Gross per 24 hour  Intake    887 ml  Output   2950 ml  Net  -2063 ml   Filed Weights   07/12/12 0030 07/13/12 0500 07/14/12 0532  Weight: 72.8 kg (160 lb 7.9 oz) 71.5 kg (157 lb 10.1 oz) 72 kg (158 lb 11.7 oz)    Exam:   General exam: Appears comfortable.  Respiratory system: with bibasilar crackles. No increased work of breathing.  Cardiovascular system: S1 and S2 heard, regular rate and rhythm. No JVD or murmurs. Telemetry shows sinus rhythm. 1+ bilateral pitting edema.  Gastrointestinal system: Abdomen is nondistended, soft and nontender. Normal bowel sounds heard.  Central nervous system: Alert and oriented. No focal neurological deficits.  Extremities: Symmetric 5 x 5 power. Improved swelling in both lower est, erythema R foot   Data Reviewed: Basic Metabolic Panel:  Lab 07/14/12 8119 07/13/12 0450 07/12/12 0139 07/11/12 1943  NA 135 135 134* 133*  K 4.7 4.3 4.4 4.2  CL 100 101 99 97  CO2 25 27 24 26   GLUCOSE 119* 135* 240* 199*  BUN 27* 25* 26* 27*  CREATININE 1.22* 1.24* 1.12* 1.17*  CALCIUM 11.1* 11.2* 11.0* 11.3*  MG -- -- -- --  PHOS -- -- -- --   Liver Function Tests:  Lab 07/12/12 0139  AST 14  ALT 16  ALKPHOS 199*  BILITOT 0.4  PROT 6.9  ALBUMIN 2.6*   No results found for this basename: LIPASE:5,AMYLASE:5 in the last 168 hours No results found for this basename: AMMONIA:5 in the last 168 hours CBC:  Lab 07/14/12 0520 07/12/12 0139 07/11/12 1943 07/09/12 0519 07/08/12 0530  WBC 8.4 8.1 8.9 9.6 10.3  NEUTROABS -- 5.8 6.1 -- --  HGB 9.0* 8.9* 9.6* 9.2* 9.1*  HCT 28.7* 28.1* 29.0* 29.4* 28.9*  MCV 80.6 79.2 80.1 81.2 80.7  PLT 386 364 386 321  308   Cardiac Enzymes:  Lab 07/12/12 1334 07/12/12 0751 07/12/12 0139  CKTOTAL -- -- --  CKMB -- -- --  CKMBINDEX -- -- --  TROPONINI <0.30 <0.30 <0.30   BNP (last 3 results)  Basename 07/13/12 0450 07/12/12 0751 07/11/12 2001  PROBNP 8924.0* 11428.0* 10853.0*   CBG:  Lab 07/14/12 1128 07/14/12 0617 07/13/12 2107 07/13/12 1709 07/13/12 1109  GLUCAP 104* 122* 121* 162* 130*    Recent Results (from the past 240 hour(s))  SURGICAL PCR SCREEN     Status: Normal   Collection Time   07/06/12  9:23 PM      Component Value Range Status Comment   MRSA, PCR NEGATIVE  NEGATIVE Final    Staphylococcus aureus NEGATIVE  NEGATIVE Final      Studies: No results found.  Scheduled Meds:    . aspirin EC  81 mg Oral Daily  . atorvastatin  10 mg Oral q1800  . docusate sodium  100 mg Oral BID  . feeding supplement  237 mL Oral BID BM  . furosemide  40 mg Intravenous Daily  . insulin aspart  0-9 Units Subcutaneous TID WC  . insulin aspart protamine-insulin aspart  12 Units Subcutaneous BID WC  . isosorbide mononitrate  30 mg Oral Daily  . metoprolol succinate  25 mg Oral Daily  . pantoprazole  40 mg Oral Q0600  . ranolazine  500 mg Oral BID  . sodium chloride  3 mL Intravenous Q12H  . sodium chloride  3 mL Intravenous Q12H  . spironolactone  12.5 mg Oral BID  . warfarin  5 mg Oral q1800  . Warfarin - Pharmacist Dosing Inpatient   Does not apply q1800  . [DISCONTINUED] spironolactone  12.5 mg Oral Daily   Continuous Infusions:   Principal Problem:  *Acute on chronic combined systolic and diastolic HF (heart failure), NYHA class 3 - exacerbated by Afib RVR Active Problems:  LBBB (left bundle branch block)  Acute systolic CHF, just discharged 16/10/96  DVT Jun 21 2012  DM, Type 2 IDDM  Acute renal insufficiency, transient 06/24/12- after diuresis  Ischemic cardiomyopathy, EF 25%  Popliteal artery thrombosis, 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 She needs CABG   Chronic anticoagulation, discharged on Coumadin 07/09/12  PAF, on admission 07/12/12  Anemia    Time spent: 30 minutes    [facility]  [facility] Pager [phone] . If 8PM-8AM, please contact night-coverage at [email or web address], password [facility] 07/14/2012, 1:47 PM  LOS: 3 days             [facility] PROGRESS NOTE  [name] [name] JXB:[id number] DOB: 1946-01-09 DOA: 07/11/2012 PCP: No primary provider on file.  HPI 66 y.o. female just discharged after being hospitalized 06/21/12-07/09/12. We saw her in consult. She has severe 3V CAD and an ischemic cardiomyopathy as well as recent Rt LE DVT, and Rt popliteal artery occlusion.  She was not felt to be a candidate at this time for CABG and the plan was to treat her multiple medical issues and then plan elective CABG at a later date. She was discjharged on Coumadin. She presented late 07/11/12 with SOB, her was BNP 10K and she also had PAF in the ER (NSR now).   Assessment/Plan: 8. Acute on chronic systolic heart failure: Secondary to ischemic cardiomyopathy. Clinically improving after diuresis-per Cardiology, ACE/ARB on hold duet to soft BP and slight bump in craetinine, 3 vessel CAD timing of CABG per CARDs 9. Right lower extremity DVT and right popliteal artery occlusion: Continue anticoagulation-Coumadin per pharmacy. INR therapeutic  10. Type 2 diabetes mellitus: was not able to afford lantus, was transitioned to Insulin 70/30 with meals, ssi, C/o nausea with metformin. DM coordinator to check with pt regarding cost and affordability of Insulin 70/30 11. Anemia: Fairly stable. Follow CBC daily. No active bleeding.  12. Recent cellulitis of right lower extremity: Resolved. 13. Hypercalcemia likely secondary to Primary hyperparathyroidism: Intact PTH elevated 129 in 11/13, has had asymptomatic hypercalcemia since 2009, will need Endocrine FU after discharge to assess parathyroid gland. 14. Chronic kidney disease  stage II to 3: Creatinine stable. Follow BMP daily.  Code Status: Full Family Communication: Discussed with patient Disposition Plan:  Home when medically stable   Consultants:  Cardiology  Procedures:  None  Antibiotics:  None  HPI/Subjective: Dyspnea is better, swelling improved significantly, r leg hurting today  Objective: Filed Vitals:   07/13/12 1655 07/13/12 2045 07/14/12 0532 07/14/12 0936  BP: 106/64 95/58 103/62 100/61  Pulse: 74 77 75 75  Temp:  98 F (36.7 C) 98.2 F (36.8 C) 97.9 F (36.6 C)  TempSrc:  Oral Oral Oral  Resp:  20 20 16   Height:      Weight:   72 kg (158 lb 11.7 oz)   SpO2:  94% 95% 93%    Intake/Output Summary (Last 24 hours) at 07/14/12 1347 Last data filed at 07/14/12 1317  Gross per 24 hour  Intake    887 ml  Output   2950 ml  Net  -2063 ml   Filed Weights   07/12/12 0030 07/13/12 0500 07/14/12 0532  Weight: 72.8 kg (160 lb 7.9 oz) 71.5 kg (157 lb 10.1 oz) 72 kg (158 lb 11.7 oz)    Exam:   General exam: Appears comfortable.  Respiratory system: with bibasilar crackles. No increased work of breathing.  Cardiovascular system: S1 and S2 heard, regular rate and rhythm. No JVD or murmurs. Telemetry shows sinus rhythm. 1+ bilateral pitting edema.  Gastrointestinal system: Abdomen is nondistended, soft and nontender. Normal bowel sounds heard.  Central nervous system: Alert and oriented. No focal neurological deficits.  Extremities: Symmetric 5 x 5 power. Improved swelling in both lower est, erythema R foot   Data Reviewed: Basic Metabolic Panel:  Lab 07/14/12 9604 07/13/12 0450 07/12/12 0139 07/11/12 1943  NA 135 135 134* 133*  K 4.7 4.3 4.4 4.2  CL 100 101 99 97  CO2 25 27 24 26   GLUCOSE 119* 135* 240* 199*  BUN 27* 25* 26* 27*  CREATININE 1.22* 1.24* 1.12* 1.17*  CALCIUM 11.1* 11.2* 11.0* 11.3*  MG -- -- -- --  PHOS -- -- -- --   Liver Function Tests:  Lab 07/12/12 0139  AST 14  ALT 16  ALKPHOS 199*    BILITOT 0.4  PROT 6.9  ALBUMIN 2.6*   No results found for this basename: LIPASE:5,AMYLASE:5 in  the last 168 hours No results found for this basename: AMMONIA:5 in the last 168 hours CBC:  Lab 07/14/12 0520 07/12/12 0139 07/11/12 1943 07/09/12 0519 07/08/12 0530  WBC 8.4 8.1 8.9 9.6 10.3  NEUTROABS -- 5.8 6.1 -- --  HGB 9.0* 8.9* 9.6* 9.2* 9.1*  HCT 28.7* 28.1* 29.0* 29.4* 28.9*  MCV 80.6 79.2 80.1 81.2 80.7  PLT 386 364 386 321 308   Cardiac Enzymes:  Lab 07/12/12 1334 07/12/12 0751 07/12/12 0139  CKTOTAL -- -- --  CKMB -- -- --  CKMBINDEX -- -- --  TROPONINI <0.30 <0.30 <0.30   BNP (last 3 results)  Basename 07/13/12 0450 07/12/12 0751 07/11/12 2001  PROBNP 8924.0* 11428.0* 10853.0*   CBG:  Lab 07/14/12 1128 07/14/12 0617 07/13/12 2107 07/13/12 1709 07/13/12 1109  GLUCAP 104* 122* 121* 162* 130*    Recent Results (from the past 240 hour(s))  SURGICAL PCR SCREEN     Status: Normal   Collection Time   07/06/12  9:23 PM      Component Value Range Status Comment   MRSA, PCR NEGATIVE  NEGATIVE Final    Staphylococcus aureus NEGATIVE  NEGATIVE Final      Studies: No results found.  Scheduled Meds:    . aspirin EC  81 mg Oral Daily  . atorvastatin  10 mg Oral q1800  . docusate sodium  100 mg Oral BID  . feeding supplement  237 mL Oral BID BM  . furosemide  40 mg Intravenous Daily  . insulin aspart  0-9 Units Subcutaneous TID WC  . insulin aspart protamine-insulin aspart  12 Units Subcutaneous BID WC  . isosorbide mononitrate  30 mg Oral Daily  . metoprolol succinate  25 mg Oral Daily  . pantoprazole  40 mg Oral Q0600  . ranolazine  500 mg Oral BID  . sodium chloride  3 mL Intravenous Q12H  . sodium chloride  3 mL Intravenous Q12H  . spironolactone  12.5 mg Oral BID  . warfarin  5 mg Oral q1800  . Warfarin - Pharmacist Dosing Inpatient   Does not apply q1800  . [DISCONTINUED] spironolactone  12.5 mg Oral Daily   Continuous Infusions:   Principal  Problem:  *Acute on chronic combined systolic and diastolic HF (heart failure), NYHA class 3 - exacerbated by Afib RVR Active Problems:  LBBB (left bundle branch block)  Acute systolic CHF, just discharged 16/10/96  DVT Jun 21 2012  DM, Type 2 IDDM  Acute renal insufficiency, transient 06/24/12- after diuresis  Ischemic cardiomyopathy, EF 25%  Popliteal artery thrombosis, 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 She needs CABG  Chronic anticoagulation, discharged on Coumadin 07/09/12  PAF, on admission 07/12/12  Anemia    Time spent: 30 minutes    [facility]  [facility] Pager [phone] . If 8PM-8AM, please contact night-coverage at [email or web address], password [facility] 07/14/2012, 1:47 PM  LOS: 3 days

## 2012-07-14 NOTE — Progress Notes (Signed)
Inpatient Diabetes Program Recommendations  AACE/ADA: New Consensus Statement on Inpatient Glycemic Control (2013)  Target Ranges:  Prepandial:   less than 140 mg/dL      Peak postprandial:   less than 180 mg/dL (1-2 hours)      Critically ill patients:  140 - 180 mg/dL   Reason for Visit: Referral received from Dr. Jomarie Longs.  Attempted to speak to patient however she asked that I come back tomorrow because she had visitors.  Spoke to Dr. Jomarie Longs regarding possibility of discharging patients home on oral agents instead of insulin. Will attempt to talk to patient tomorrow.

## 2012-07-14 NOTE — Progress Notes (Signed)
Subjective:  Still some DOE And chest heaviness when she gets anxious or moves around a lot. She had significant pain in her right foot earlier this morning while sleeping requiring significant pain medications it is now better. She is very happy with how much the swelling of the left foot has improved.   Objective:  Vital Signs in the last 24 hours: Temp:  [97.9 F (36.6 C)-98.2 F (36.8 C)] 97.9 F (36.6 C) (11/21 0936) Pulse Rate:  [71-77] 75  (11/21 0936) Resp:  [16-20] 16  (11/21 0936) BP: (93-106)/(57-64) 100/61 mmHg (11/21 0936) SpO2:  [92 %-95 %] 93 % (11/21 0936) Weight:  [72 kg (158 lb 11.7 oz)] 72 kg (158 lb 11.7 oz) (11/21 0532)  Intake/Output from previous day:  Intake/Output Summary (Last 24 hours) at 07/14/12 1351 Last data filed at 07/14/12 1317  Gross per 24 hour  Intake    887 ml  Output   2950 ml  Net  -2063 ml      . aspirin EC  81 mg Oral Daily  . atorvastatin  10 mg Oral q1800  . docusate sodium  100 mg Oral BID  . feeding supplement  237 mL Oral BID BM  . furosemide  40 mg Intravenous Daily  . insulin aspart  0-9 Units Subcutaneous TID WC  . insulin aspart protamine-insulin aspart  12 Units Subcutaneous BID WC  . isosorbide mononitrate  30 mg Oral Daily  . metoprolol succinate  25 mg Oral Daily  . pantoprazole  40 mg Oral Q0600  . ranolazine  500 mg Oral BID  . sodium chloride  3 mL Intravenous Q12H  . sodium chloride  3 mL Intravenous Q12H  . spironolactone  12.5 mg Oral BID  . warfarin  5 mg Oral q1800  . Warfarin - Pharmacist Dosing Inpatient   Does not apply q1800  . [DISCONTINUED] spironolactone  12.5 mg Oral Daily      Physical Exam: General appearance: alert, cooperative and no distress JVD 7-8 cm Lungs: bi basilar rales Heart: RRR with S4 gallop and her and a Abd; soft, NT/M.D./NA BS Edema improved.; Right foot still has the erythematous almost petechial appearance on the dorsum of the foot   Rate: 70  Rhythm: normal sinus  rhythm  Lab Results:  Basename 07/14/12 0520 07/12/12 0139  WBC 8.4 8.1  HGB 9.0* 8.9*  PLT 386 364    Basename 07/14/12 0520 07/13/12 0450  NA 135 135  K 4.7 4.3  CL 100 101  CO2 25 27  GLUCOSE 119* 135*  BUN 27* 25*  CREATININE 1.22* 1.24*    Basename 07/12/12 1334 07/12/12 0751  TROPONINI <0.30 <0.30   Hepatic Function Panel  Basename 07/12/12 0139  PROT 6.9  ALBUMIN 2.6*  AST 14  ALT 16  ALKPHOS 199*  BILITOT 0.4  BILIDIR --  IBILI --   No results found for this basename: CHOL in the last 72 hours  Basename 07/14/12 0520  INR 2.42*    Imaging: No new images Cardiac Studies: No recent cardiac studies  Assessment/Plan:   Principal Problem:  *Acute on chronic combined systolic and diastolic HF (heart failure), NYHA class 3 - exacerbated by Afib RVR Active Problems:  Ischemic cardiomyopathy, EF 25%  CAD, 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 She needs CABG  Chronic anticoagulation, discharged on Coumadin 07/09/12  PAF, on admission 07/12/12  DVT Jun 21 2012  DM, Type 2 IDDM  Popliteal artery thrombosis, right 06/21/12  LBBB (left bundle branch  block)  Acute systolic CHF, just discharged 78/29/56  Acute renal insufficiency, transient 06/24/12- after diuresis  Anemia  She is diuresing but still has rales. Continue IV Lasix and oral Aldactone.  Ranexa added yesterday -- is a nice option with her borderline low blood pressure, however, I am not sure if she will be able to afford it.  Blood sugar levels are well controlled on current regimen.  She had pain in her right foot this morning while sleeping. He required significant pain medication. I am concerned that she has potential resting limb ischemia on the right foot with the popliteal artery occlusion. This is a major issue with her being on long term anticoagulation needing CABG for ischemic cardiomyopathy plus this popliteal artery occlusion. However with resting pain we may need to consider potential  intervention at a sooner point. Will discuss with Dr. Allyson Sabal who will be able to see her this weekend.    for her heart failure and coronary disease she is on low dose of long-acting beta blocker but does not have enough blood pressure room to add an ACE inhibitor. She is on long-acting nitrate along with aspirin and statin.  Marykay Lex, M.D., M.S. [facility] [address] [place], [place]  [address]  [phone] Pager # [phone] 07/14/2012 1:58 PM

## 2012-07-14 NOTE — Progress Notes (Signed)
ANTICOAGULATION CONSULT NOTE - Follow Up Consult  Pharmacy Consult for Coumadin Indication: h/o DVT  Allergies  Allergen Reactions  . Penicillins Anaphylaxis  . Sulfa Antibiotics Anaphylaxis    Patient Measurements: Height: 5\' 6"  (167.6 cm) Weight: 158 lb 11.7 oz (72 kg) IBW/kg (Calculated) : 59.3    Vital Signs: Temp: 97.9 F (36.6 C) (11/21 0936) Temp src: Oral (11/21 0936) BP: 100/61 mmHg (11/21 0936) Pulse Rate: 75  (11/21 0936)  Labs:  Basename 07/14/12 0520 07/13/12 0450 07/12/12 1334 07/12/12 0751 07/12/12 0655 07/12/12 0139 07/11/12 1943  HGB 9.0* -- -- -- -- 8.9* --  HCT 28.7* -- -- -- -- 28.1* 29.0*  PLT 386 -- -- -- -- 364 386  APTT -- -- -- -- -- -- 59*  LABPROT 25.2* 23.7* -- -- 27.0* -- --  INR 2.42* 2.23* -- -- 2.65* -- --  HEPARINUNFRC -- -- -- -- -- -- --  CREATININE 1.22* 1.24* -- -- -- 1.12* --  CKTOTAL -- -- -- -- -- -- --  CKMB -- -- -- -- -- -- --  TROPONINI -- -- <0.30 <0.30 -- <0.30 --    Estimated Creatinine Clearance: 46.7 ml/min (by C-G formula based on Cr of 1.22).  Assessment:  66 y/o female patient admitted with sob/ hf exacerbation, on chronic coumadin for h/o DVT. INR remains therapeutic and stabile. Continue home regimen.  Goal of Therapy:  INR 2-3 Monitor platelets by anticoagulation protocol: Yes   Plan:  Continue home Coumadin 5mg /day Daily PT/INR  Verlene Mayer, PharmD, BCPS Pager [phone] 07/14/2012,11:51 AM

## 2012-07-15 LAB — PROTIME-INR: INR: 2.84 — ABNORMAL HIGH (ref 0.00–1.49)

## 2012-07-15 LAB — GLUCOSE, CAPILLARY
Glucose-Capillary: 159 mg/dL — ABNORMAL HIGH (ref 70–99)
Glucose-Capillary: 133 mg/dL — ABNORMAL HIGH (ref 70–99)

## 2012-07-15 MED ORDER — HYDRALAZINE HCL 10 MG PO TABS
10.0000 mg | ORAL_TABLET | Freq: Three times a day (TID) | ORAL | Status: DC
  Administered 2012-07-15 – 2012-07-17 (×5): 10 mg via ORAL
  Filled 2012-07-15 (×9): qty 1

## 2012-07-15 MED ORDER — CARVEDILOL 6.25 MG PO TABS
6.2500 mg | ORAL_TABLET | Freq: Two times a day (BID) | ORAL | Status: DC
  Administered 2012-07-16 – 2012-07-25 (×19): 6.25 mg via ORAL
  Filled 2012-07-15 (×22): qty 1

## 2012-07-15 NOTE — Progress Notes (Signed)
Subjective:  Still some DOE  Objective:  Vital Signs in the last 24 hours: Temp:  [98.2 F (36.8 C)-98.9 F (37.2 C)] 98.2 F (36.8 C) (11/22 0458) Pulse Rate:  [75-84] 75  (11/22 1040) Resp:  [18-19] 18  (11/22 1040) BP: (85-104)/(49-58) 96/49 mmHg (11/22 1040) SpO2:  [97 %-98 %] 98 % (11/22 1040) Weight:  [72.031 kg (158 lb 12.8 oz)] 72.031 kg (158 lb 12.8 oz) (11/22 0458)  Intake/Output from previous day:  Intake/Output Summary (Last 24 hours) at 07/15/12 1058 Last data filed at 07/15/12 0926  Gross per 24 hour  Intake    906 ml  Output   1000 ml  Net    -94 ml      . aspirin EC  81 mg Oral Daily  . atorvastatin  10 mg Oral q1800  . docusate sodium  100 mg Oral BID  . feeding supplement  237 mL Oral BID BM  . furosemide  40 mg Intravenous Daily  . insulin aspart  0-9 Units Subcutaneous TID WC  . insulin aspart protamine-insulin aspart  12 Units Subcutaneous BID WC  . isosorbide mononitrate  30 mg Oral Daily  . metoprolol succinate  25 mg Oral Daily  . pantoprazole  40 mg Oral Q0600  . polyethylene glycol  17 g Oral BID  . ranolazine  500 mg Oral BID  . sodium chloride  3 mL Intravenous Q12H  . spironolactone  12.5 mg Oral BID  . warfarin  5 mg Oral q1800  . Warfarin - Pharmacist Dosing Inpatient   Does not apply q1800  . [DISCONTINUED] sodium chloride  3 mL Intravenous Q12H    Physical Exam: General appearance: alert, cooperative and no distress Lungs: bi basilar rales Heart: regular rate and rhythm   Rate: 76  Rhythm: normal sinus rhythm  Lab Results:  Basename 07/14/12 0520  WBC 8.4  HGB 9.0*  PLT 386    Basename 07/14/12 0520 07/13/12 0450  NA 135 135  K 4.7 4.3  CL 100 101  CO2 25 27  GLUCOSE 119* 135*  BUN 27* 25*  CREATININE 1.22* 1.24*    Basename 07/12/12 1334  TROPONINI <0.30   Hepatic Function Panel No results found for this basename: PROT,ALBUMIN,AST,ALT,ALKPHOS,BILITOT,BILIDIR,IBILI in the last 72 hours No results found for  this basename: CHOL in the last 72 hours  Basename 07/15/12 0505  INR 2.84*   BNP    Component Value Date/Time   PROBNP 8924.0* 07/13/2012 0450     Imaging: Imaging results have been reviewed  Cardiac Studies:  Assessment/Plan:   Principal Problem:  *Acute on chronic combined systolic and diastolic HF (heart failure), NYHA class 3 - exacerbated by Afib RVR Active Problems:  Acute systolic CHF, just discharged 09/81/19  CAD, 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    Corine Shelter PA-C 07/15/2012, 10:58 AM   Patient seen and examined. Agree with assessment and plan. Clinically improving.  Will LV dysfunction will change Toprol 25 mg to carvedilolol at 6.25 mg bid.Will  Add low dose hydralazine. F/U BNP in am   Lennette Bihari, MD, [facility] 07/15/2012 4:36 PM

## 2012-07-15 NOTE — Progress Notes (Deleted)
[facility] PROGRESS NOTE  [name] [name] JYN:[id number] DOB: Jul 04, 1946 DOA: 07/11/2012 PCP: No primary provider on file.  HPI 66 y.o. female just discharged after being hospitalized 06/21/12-07/09/12. We saw her in consult. She has severe 3V CAD and an ischemic cardiomyopathy as well as recent Rt LE DVT, and Rt popliteal artery occlusion. She was not felt to be a candidate at this time for CABG and the plan was to treat her multiple medical issues and then plan elective CABG at a later date. She was discjharged on Coumadin. She presented late 07/11/12 with SOB, her was BNP 10K and she also had PAF in the ER (NSR now).   Assessment/Plan: 1. Acute on chronic systolic heart failure: Secondary to ischemic cardiomyopathy. Clinically improving after diuresis-continue IV Lasix and oral Aldactone. Cardiology was consulted and following, 3 vessel CAD timing of CABG per CARDs 2. Right lower extremity DVT and right popliteal artery occlusion: Continue anticoagulation-Coumadin per pharmacy. INR therapeutic at 2.65-anticoagulated. 3. Type 2 diabetes mellitus: was not able to afford lantus, will transition to Insulin 70/30 with meals, ssi and  metformin at DC 4. Anemia: Fairly stable. Follow CBC daily. No active bleeding.  5. Recent cellulitis of right lower extremity: Resolved. 6. Hypercalcemia possibly secondary to hyperparathyroidism: will check PTH, monitor Ca . Asymptomatic. IV Lasix should help. 7. 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 Follow BMP daily.  Code Status: Full Family Communication: Discussed with patient Disposition Plan:  Home when medically stable   Consultants:  Cardiology  Procedures:  None  Antibiotics:  None  HPI/Subjective: Dyspnea is better, swelling improved significantly,  Frustrated/angry about cold breakfast. Denies chest pain.  Objective: Filed Vitals:   07/14/12 2028 07/14/12 2058 07/15/12 0134 07/15/12 0458  BP: 85/50 96/54  104/54 98/58  Pulse: 84  77 76  Temp: 98.9 F (37.2 C)   98.2 F (36.8 C)  TempSrc: Oral   Oral  Resp: 19  18 18   Height:      Weight:    72.031 kg (158 lb 12.8 oz)  SpO2: 97%  98% 98%    Intake/Output Summary (Last 24 hours) at 07/15/12 0811 Last data filed at 07/15/12 8657  Gross per 24 hour  Intake    909 ml  Output   1650 ml  Net   -741 ml   Filed Weights   07/13/12 0500 07/14/12 0532 07/15/12 0458  Weight: 71.5 kg (157 lb 10.1 oz) 72 kg (158 lb 11.7 oz) 72.031 kg (158 lb 12.8 oz)    Exam:   General exam: Appears comfortable.  Respiratory system: with bibasilar crackles. No increased work of breathing.  Cardiovascular system: S1 and S2 heard, regular rate and rhythm. No JVD or murmurs. Telemetry shows sinus rhythm. 1+ bilateral pitting edema.  Gastrointestinal system: Abdomen is nondistended, soft and nontender. Normal bowel sounds heard.  Central nervous system: Alert and oriented. No focal neurological deficits.  Extremities: Symmetric 5 x 5 power. Improved swelling in both lower est, erythema R foot   Data Reviewed: Basic Metabolic Panel:  Lab 07/14/12 8469 07/13/12 0450 07/12/12 0139 07/11/12 1943  NA 135 135 134* 133*  K 4.7 4.3 4.4 4.2  CL 100 101 99 97  CO2 25 27 24 26   GLUCOSE 119* 135* 240* 199*  BUN 27* 25* 26* 27*  CREATININE 1.22* 1.24* 1.12* 1.17*  CALCIUM 11.1* 11.2* 11.0* 11.3*  MG -- -- -- --  PHOS -- -- -- --   Liver Function Tests:  Lab  07/12/12 0139  AST 14  ALT 16  ALKPHOS 199*  BILITOT 0.4  PROT 6.9  ALBUMIN 2.6*   No results found for this basename: LIPASE:5,AMYLASE:5 in the last 168 hours No results found for this basename: AMMONIA:5 in the last 168 hours CBC:  Lab 07/14/12 0520 07/12/12 0139 07/11/12 1943 07/09/12 0519  WBC 8.4 8.1 8.9 9.6  NEUTROABS -- 5.8 6.1 --  HGB 9.0* 8.9* 9.6* 9.2*  HCT 28.7* 28.1* 29.0* 29.4*  MCV 80.6 79.2 80.1 81.2  PLT 386 364 386 321   Cardiac Enzymes:  Lab 07/12/12 1334 07/12/12  0751 07/12/12 0139  CKTOTAL -- -- --  CKMB -- -- --  CKMBINDEX -- -- --  TROPONINI <0.30 <0.30 <0.30   BNP (last 3 results)  Basename 07/13/12 0450 07/12/12 0751 07/11/12 2001  PROBNP 8924.0* 11428.0* 10853.0*   CBG:  Lab 07/15/12 0558 07/14/12 2051 07/14/12 1610 07/14/12 1128 07/14/12 0617  GLUCAP 159* 144* 135* 104* 122*    Recent Results (from the past 240 hour(s))  SURGICAL PCR SCREEN     Status: Normal   Collection Time   07/06/12  9:23 PM      Component Value Range Status Comment   MRSA, PCR NEGATIVE  NEGATIVE Final    Staphylococcus aureus NEGATIVE  NEGATIVE Final      Studies: No results found.  Scheduled Meds:    . aspirin EC  81 mg Oral Daily  . atorvastatin  10 mg Oral q1800  . docusate sodium  100 mg Oral BID  . feeding supplement  237 mL Oral BID BM  . furosemide  40 mg Intravenous Daily  . insulin aspart  0-9 Units Subcutaneous TID WC  . insulin aspart protamine-insulin aspart  12 Units Subcutaneous BID WC  . isosorbide mononitrate  30 mg Oral Daily  . metoprolol succinate  25 mg Oral Daily  . pantoprazole  40 mg Oral Q0600  . polyethylene glycol  17 g Oral BID  . ranolazine  500 mg Oral BID  . sodium chloride  3 mL Intravenous Q12H  . sodium chloride  3 mL Intravenous Q12H  . spironolactone  12.5 mg Oral BID  . warfarin  5 mg Oral q1800  . Warfarin - Pharmacist Dosing Inpatient   Does not apply q1800   Continuous Infusions:   Principal Problem:  *Acute on chronic combined systolic and diastolic HF (heart failure), NYHA class 3 - exacerbated by Afib RVR Active Problems:  LBBB (left bundle branch block)  Acute systolic CHF, just discharged 16/10/96  DVT Jun 21 2012  DM, Type 2 IDDM  Acute renal insufficiency, transient 06/24/12- after diuresis  Ischemic cardiomyopathy, EF 25%  Popliteal artery thrombosis, 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 She needs CABG  Chronic anticoagulation, discharged on Coumadin 07/09/12  PAF, on  admission 07/12/12  Anemia    Time spent: 30 minutes    [facility]  [facility] Pager [phone] . If 8PM-8AM, please contact night-coverage at [email or web address], password [facility] 07/15/2012, 8:11 AM  LOS: 4 days             [facility] PROGRESS NOTE  [name] [name] JXB:[id number] DOB: September 16, 1945 DOA: 07/11/2012 PCP: No primary provider on file.  HPI 66 y.o. female just discharged after being hospitalized 06/21/12-07/09/12. We saw her in consult. She has severe 3V CAD and an ischemic cardiomyopathy as well as recent Rt LE DVT, and Rt popliteal artery occlusion. She was not felt to be a candidate at  this time for CABG and the plan was to treat her multiple medical issues and then plan elective CABG at a later date. She was discjharged on Coumadin. She presented late 07/11/12 with SOB, her was BNP 10K and she also had PAF in the ER (NSR now).   Assessment/Plan: 8. Acute on chronic systolic heart failure: Secondary to ischemic cardiomyopathy. Clinically improving after diuresis-per Cardiology, ACE/ARB on hold due to soft BP and slight bump in craetinine, 3 vessel CAD timing of CABG per CARDs 9. Right lower extremity DVT and right popliteal artery occlusion: Continue anticoagulation-Coumadin per pharmacy. INR therapeutic, was seen by [facility] last admission and due to concomitant DVTs, CHF and CAD she was not felt to be a candidate for aggressive intervention then, will need FU with [facility] for arteriogram   10. Type 2 diabetes mellitus: was not able to afford lantus, was transitioned to Insulin 70/30 with meals, ssi, C/o nausea with metformin. DM coordinator to check with pt regarding cost and affordability of Insulin 70/30 today, if unable to afford 70/30 would DC home on Metformin and glyburide 11. Anemia: Fairly stable. Follow CBC daily. No active bleeding.  12. Recent cellulitis of right lower extremity: Resolved. 13. Hypercalcemia likely secondary to Primary  hyperparathyroidism: Intact PTH elevated 129 in 11/13, has had asymptomatic hypercalcemia since 2009, will need Endocrine FU after discharge to assess parathyroid gland. 14. 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 Follow BMP daily.  Code Status: Full Family Communication: Discussed with patient Disposition Plan:  Home when medically stable   Consultants:  Cardiology  Procedures:  None  Antibiotics:  None  HPI/Subjective: Dyspnea is better, swelling improved significantly, r foot still painful  Objective: Filed Vitals:   07/14/12 2028 07/14/12 2058 07/15/12 0134 07/15/12 0458  BP: 85/50 96/54 104/54 98/58  Pulse: 84  77 76  Temp: 98.9 F (37.2 C)   98.2 F (36.8 C)  TempSrc: Oral   Oral  Resp: 19  18 18   Height:      Weight:    72.031 kg (158 lb 12.8 oz)  SpO2: 97%  98% 98%    Intake/Output Summary (Last 24 hours) at 07/15/12 0811 Last data filed at 07/15/12 1610  Gross per 24 hour  Intake    909 ml  Output   1650 ml  Net   -741 ml   Filed Weights   07/13/12 0500 07/14/12 0532 07/15/12 0458  Weight: 71.5 kg (157 lb 10.1 oz) 72 kg (158 lb 11.7 oz) 72.031 kg (158 lb 12.8 oz)    Exam:   General exam: Appears comfortable.  Respiratory system: with bibasilar crackles. No increased work of breathing.  Cardiovascular system: S1 and S2 heard, regular rate and rhythm. No JVD or murmurs. Telemetry shows sinus rhythm. 1+ bilateral pitting edema.  Gastrointestinal system: Abdomen is nondistended, soft and nontender. Normal bowel sounds heard.  Central nervous system: Alert and oriented. No focal neurological deficits.  Extremities: Symmetric 5 x 5 power. Improved swelling in both lower est, erythema R foot   Data Reviewed: Basic Metabolic Panel:  Lab 07/14/12 9604 07/13/12 0450 07/12/12 0139 07/11/12 1943  NA 135 135 134* 133*  K 4.7 4.3 4.4 4.2  CL 100 101 99 97  CO2 25 27 24 26   GLUCOSE 119* 135* 240* 199*  BUN 27* 25* 26* 27*    CREATININE 1.22* 1.24* 1.12* 1.17*  CALCIUM 11.1* 11.2* 11.0* 11.3*  MG -- -- -- --  PHOS -- -- -- --  Liver Function Tests:  Lab 07/12/12 0139  AST 14  ALT 16  ALKPHOS 199*  BILITOT 0.4  PROT 6.9  ALBUMIN 2.6*   No results found for this basename: LIPASE:5,AMYLASE:5 in the last 168 hours No results found for this basename: AMMONIA:5 in the last 168 hours CBC:  Lab 07/14/12 0520 07/12/12 0139 07/11/12 1943 07/09/12 0519  WBC 8.4 8.1 8.9 9.6  NEUTROABS -- 5.8 6.1 --  HGB 9.0* 8.9* 9.6* 9.2*  HCT 28.7* 28.1* 29.0* 29.4*  MCV 80.6 79.2 80.1 81.2  PLT 386 364 386 321   Cardiac Enzymes:  Lab 07/12/12 1334 07/12/12 0751 07/12/12 0139  CKTOTAL -- -- --  CKMB -- -- --  CKMBINDEX -- -- --  TROPONINI <0.30 <0.30 <0.30   BNP (last 3 results)  Basename 07/13/12 0450 07/12/12 0751 07/11/12 2001  PROBNP 8924.0* 11428.0* 10853.0*   CBG:  Lab 07/15/12 0558 07/14/12 2051 07/14/12 1610 07/14/12 1128 07/14/12 0617  GLUCAP 159* 144* 135* 104* 122*    Recent Results (from the past 240 hour(s))  SURGICAL PCR SCREEN     Status: Normal   Collection Time   07/06/12  9:23 PM      Component Value Range Status Comment   MRSA, PCR NEGATIVE  NEGATIVE Final    Staphylococcus aureus NEGATIVE  NEGATIVE Final      Studies: No results found.  Scheduled Meds:    . aspirin EC  81 mg Oral Daily  . atorvastatin  10 mg Oral q1800  . docusate sodium  100 mg Oral BID  . feeding supplement  237 mL Oral BID BM  . furosemide  40 mg Intravenous Daily  . insulin aspart  0-9 Units Subcutaneous TID WC  . insulin aspart protamine-insulin aspart  12 Units Subcutaneous BID WC  . isosorbide mononitrate  30 mg Oral Daily  . metoprolol succinate  25 mg Oral Daily  . pantoprazole  40 mg Oral Q0600  . polyethylene glycol  17 g Oral BID  . ranolazine  500 mg Oral BID  . sodium chloride  3 mL Intravenous Q12H  . sodium chloride  3 mL Intravenous Q12H  . spironolactone  12.5 mg Oral BID  .  warfarin  5 mg Oral q1800  . Warfarin - Pharmacist Dosing Inpatient   Does not apply q1800   Continuous Infusions:   Principal Problem:  *Acute on chronic combined systolic and diastolic HF (heart failure), NYHA class 3 - exacerbated by Afib RVR Active Problems:  LBBB (left bundle branch block)  Acute systolic CHF, just discharged 95/62/13  DVT Jun 21 2012  DM, Type 2 IDDM  Acute renal insufficiency, transient 06/24/12- after diuresis  Ischemic cardiomyopathy, EF 25%  Popliteal artery thrombosis, 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 She needs CABG  Chronic anticoagulation, discharged on Coumadin 07/09/12  PAF, on admission 07/12/12  Anemia    Time spent: 30 minutes    [facility]  [facility] Pager [phone] . If 8PM-8AM, please contact night-coverage at [email or web address], password [facility] 07/15/2012, 8:11 AM  LOS: 4 days

## 2012-07-15 NOTE — Progress Notes (Signed)
ANTICOAGULATION CONSULT NOTE - Follow Up Consult  Pharmacy Consult for Coumadin Indication: hx DVT  Allergies  Allergen Reactions  . Penicillins Anaphylaxis  . Sulfa Antibiotics Anaphylaxis   Vital Signs: Temp: 98.2 F (36.8 C) (11/22 0458) Temp src: Oral (11/22 0458) BP: 98/58 mmHg (11/22 0458) Pulse Rate: 76  (11/22 0458)  Labs:  Basename 07/15/12 0505 07/14/12 0520 07/13/12 0450 07/12/12 1334  HGB -- 9.0* -- --  HCT -- 28.7* -- --  PLT -- 386 -- --  APTT -- -- -- --  LABPROT 28.4* 25.2* 23.7* --  INR 2.84* 2.42* 2.23* --  HEPARINUNFRC -- -- -- --  CREATININE -- 1.22* 1.24* --  CKTOTAL -- -- -- --  CKMB -- -- -- --  TROPONINI -- -- -- <0.30    Estimated Creatinine Clearance: 46.7 ml/min (by C-G formula based on Cr of 1.22).  Assessment: 65yof continues on coumadin for hx DVT of lower extremities (06/22/12) with R popliteal artery thrombus (06/29/12). INR remains therapeutic on home regimen of 5mg  daily. No bleeding noted. No CBC today.  Goal of Therapy:  INR 2-3 Monitor platelets by anticoagulation protocol: Yes   Plan:  1) Continue coumadin 5mg  daily 2) Follow up INR in AM  Fredrik Rigger 07/15/2012,10:23 AM

## 2012-07-15 NOTE — Progress Notes (Addendum)
Inpatient Diabetes Program Recommendations  AACE/ADA: New Consensus Statement on Inpatient Glycemic Control (2013)  Target Ranges:  Prepandial:   less than 140 mg/dL      Peak postprandial:   less than 180 mg/dL (1-2 hours)      Critically ill patients:  140 - 180 mg/dL   Reason for Visit: Results for LAPREAL, VICK (MRN [id number]) as of 07/15/2012 16:40  Ref. Range 07/14/2012 16:10 07/14/2012 20:51 07/15/2012 05:58 07/15/2012 11:12 07/15/2012 15:52  Glucose-Capillary Latest Range: 70-99 mg/dL 956 (H) 213 (H) 086 (H) 81 165 (H)  Results for MAHITA, ROPE (MRN [id number]) as of 07/15/2012 16:40  Ref. Range 06/27/2012 14:30  Hemoglobin A1C Latest Range: <5.7 % 9.0 (H)   Talked with patient regarding home diabetes medication regimen.  She states that she is concerned more with her heart (CHF) and with what her cardiologist says.  Discussed possibility of insulin as a discharge medication and she stated that she is not willing to take insulin at home.  Encouraged her to follow-up with a PCP (which she does not have at this time).  Will order case management consult regarding PCP. Discussed with Dr. Jomarie Longs, recommend continuation of Metformin and Amaryl 4 mg daily. Please discontinue 70/30 insulin.

## 2012-07-15 NOTE — Progress Notes (Addendum)
[facility] PROGRESS NOTE  [name] [name] ZOX:[id number] DOB: 1946/02/02 DOA: 07/11/2012 PCP: No primary provider on file.  HPI 66 y.o. female just discharged after being hospitalized 06/21/12-07/09/12. We saw her in consult. She has severe 3V CAD and an ischemic cardiomyopathy as well as recent Rt LE DVT, and Rt popliteal artery occlusion. She was not felt to be a candidate at this time for CABG and the plan was to treat her multiple medical issues and then plan elective CABG at a later date. She was discjharged on Coumadin. She presented late 07/11/12 with SOB, her was BNP 10K and she also had PAF in the ER (NSR now).   Assessment/Plan: 1. Acute on chronic systolic heart failure: Secondary to ischemic cardiomyopathy. Clinically improving after diuresis-continue IV Lasix and oral Aldactone. Cardiology was consulted and following, 3 vessel CAD timing of CABG per CARDs, ACE and ARB hold due to soft BP 2. Right lower extremity DVT and right popliteal artery occlusion: Continue anticoagulation-Coumadin per pharmacy. INR therapeutic at 2.65-anticoagulated, was seen by [facility] last admission and was not felt to be candidate for intervention now due to concomitant DVT, CHF and CAD, will need FU arteriogram at some point. 3. Type 2 diabetes mellitus: was not able to afford lantus, transitioned to Insulin 70/30 with meals, ssi, diabetiuc coordinator to meet with her today and see if she would be able to afford 70/30 insulin otherwise only options would be metformin/glyburide at DC 4. Anemia: Fairly stable. Follow CBC daily. No active bleeding.  5. Recent cellulitis of right lower extremity: Resolved. 6. Hypercalcemia possibly secondary to Primary hyperparathyroidism: asymptomatic, intact PHT elevated 11/13, has has asymptomatic hypercalcemia since 2209, will need FU with endocrinology at DC to eval parathyroid gland. 7. 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 Follow BMP  daily.  Code Status: Full Family Communication: Discussed with patient Disposition Plan:  Home when medically stable   Consultants:  Cardiology  Procedures:  None  Antibiotics:  None  HPI/Subjective: Dyspnea is better, swelling improved significantly,  Frustrated/angry about cold breakfast. Denies chest pain.  Objective: Filed Vitals:   07/14/12 2028 07/14/12 2058 07/15/12 0134 07/15/12 0458  BP: 85/50 96/54 104/54 98/58  Pulse: 84  77 76  Temp: 98.9 F (37.2 C)   98.2 F (36.8 C)  TempSrc: Oral   Oral  Resp: 19  18 18   Height:      Weight:    72.031 kg (158 lb 12.8 oz)  SpO2: 97%  98% 98%    Intake/Output Summary (Last 24 hours) at 07/15/12 0816 Last data filed at 07/15/12 8119  Gross per 24 hour  Intake    549 ml  Output   1650 ml  Net  -1101 ml   Filed Weights   07/13/12 0500 07/14/12 0532 07/15/12 0458  Weight: 71.5 kg (157 lb 10.1 oz) 72 kg (158 lb 11.7 oz) 72.031 kg (158 lb 12.8 oz)    Exam:   General exam: Appears comfortable.  Respiratory system: with bibasilar crackles. No increased work of breathing.  Cardiovascular system: S1 and S2 heard, regular rate and rhythm. No JVD or murmurs. Telemetry shows sinus rhythm. 1+ bilateral pitting edema.  Gastrointestinal system: Abdomen is nondistended, soft and nontender. Normal bowel sounds heard.  Central nervous system: Alert and oriented. No focal neurological deficits.  Extremities: Symmetric 5 x 5 power. Improved swelling in both lower est, erythema R foot   Data Reviewed: Basic Metabolic Panel:  Lab 07/14/12 1478 07/13/12 0450 07/12/12 0139  07/11/12 1943  NA 135 135 134* 133*  K 4.7 4.3 4.4 4.2  CL 100 101 99 97  CO2 25 27 24 26   GLUCOSE 119* 135* 240* 199*  BUN 27* 25* 26* 27*  CREATININE 1.22* 1.24* 1.12* 1.17*  CALCIUM 11.1* 11.2* 11.0* 11.3*  MG -- -- -- --  PHOS -- -- -- --   Liver Function Tests:  Lab 07/12/12 0139  AST 14  ALT 16  ALKPHOS 199*  BILITOT 0.4  PROT 6.9   ALBUMIN 2.6*   No results found for this basename: LIPASE:5,AMYLASE:5 in the last 168 hours No results found for this basename: AMMONIA:5 in the last 168 hours CBC:  Lab 07/14/12 0520 07/12/12 0139 07/11/12 1943 07/09/12 0519  WBC 8.4 8.1 8.9 9.6  NEUTROABS -- 5.8 6.1 --  HGB 9.0* 8.9* 9.6* 9.2*  HCT 28.7* 28.1* 29.0* 29.4*  MCV 80.6 79.2 80.1 81.2  PLT 386 364 386 321   Cardiac Enzymes:  Lab 07/12/12 1334 07/12/12 0751 07/12/12 0139  CKTOTAL -- -- --  CKMB -- -- --  CKMBINDEX -- -- --  TROPONINI <0.30 <0.30 <0.30   BNP (last 3 results)  Basename 07/13/12 0450 07/12/12 0751 07/11/12 2001  PROBNP 8924.0* 11428.0* 10853.0*   CBG:  Lab 07/15/12 0558 07/14/12 2051 07/14/12 1610 07/14/12 1128 07/14/12 0617  GLUCAP 159* 144* 135* 104* 122*    Recent Results (from the past 240 hour(s))  SURGICAL PCR SCREEN     Status: Normal   Collection Time   07/06/12  9:23 PM      Component Value Range Status Comment   MRSA, PCR NEGATIVE  NEGATIVE Final    Staphylococcus aureus NEGATIVE  NEGATIVE Final      Studies: No results found.  Scheduled Meds:    . aspirin EC  81 mg Oral Daily  . atorvastatin  10 mg Oral q1800  . docusate sodium  100 mg Oral BID  . feeding supplement  237 mL Oral BID BM  . furosemide  40 mg Intravenous Daily  . insulin aspart  0-9 Units Subcutaneous TID WC  . insulin aspart protamine-insulin aspart  12 Units Subcutaneous BID WC  . isosorbide mononitrate  30 mg Oral Daily  . metoprolol succinate  25 mg Oral Daily  . pantoprazole  40 mg Oral Q0600  . polyethylene glycol  17 g Oral BID  . ranolazine  500 mg Oral BID  . sodium chloride  3 mL Intravenous Q12H  . sodium chloride  3 mL Intravenous Q12H  . spironolactone  12.5 mg Oral BID  . warfarin  5 mg Oral q1800  . 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 She needs CABG  Chronic anticoagulation, discharged on Coumadin 07/09/12  PAF, on admission 07/12/12  Anemia    Time spent: 30 minutes    [facility]  [facility] Pager [phone] . If 8PM-8AM, please contact night-coverage at [email or web address], password [facility] 07/15/2012, 8:16 AM  LOS: 4 days  [facility] PROGRESS NOTE  [name] [name] JYN:[id number] DOB: October 28, 1945 DOA: 07/11/2012 PCP: No primary provider on file.  HPI 66 y.o. female just discharged after being hospitalized 06/21/12-07/09/12. We saw her in consult. She has severe 3V CAD and an ischemic cardiomyopathy as well as recent Rt LE DVT, and Rt popliteal artery occlusion. She was not felt to be a candidate at this time for CABG and the plan was to treat her multiple medical issues and then plan elective CABG at a later date. She was discjharged on Coumadin. She presented late 07/11/12 with SOB, her was BNP 10K and she also had PAF in the ER (NSR now).   Assessment/Plan: 8. Acute on chronic systolic heart failure: Secondary to ischemic cardiomyopathy. Clinically improving after diuresis-per Cardiology, ACE/ARB on hold due to soft BP and slight bump in craetinine, 3 vessel CAD timing of CABG per CARDs 9. Right lower extremity DVT and right popliteal artery occlusion: Continue anticoagulation-Coumadin per pharmacy. INR therapeutic  10. Type 2 diabetes mellitus: was not able to afford lantus, was transitioned to Insulin 70/30 with meals, ssi, C/o nausea with metformin. DM coordinator to check with pt regarding cost and affordability of Insulin  70/30 11. Anemia: Fairly stable. Follow CBC daily. No active bleeding.  12. Recent cellulitis of right lower extremity: Resolved. 13. Hypercalcemia likely secondary to Primary hyperparathyroidism: Intact PTH elevated 129 in 11/13, has had asymptomatic hypercalcemia since 2009, will need Endocrine FU after discharge to assess parathyroid gland. 14. 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 Follow BMP daily.  Code Status: Full Family Communication: Discussed with patient Disposition Plan:  Home when medically stable   Consultants:  Cardiology  Procedures:  None  Antibiotics:  None  HPI/Subjective: Still complains of R leg pain, breathing ok, angry abt breakfast  Objective: Filed Vitals:   07/14/12 2028 07/14/12 2058 07/15/12 0134 07/15/12 0458  BP: 85/50 96/54 104/54 98/58  Pulse: 84  77 76  Temp: 98.9 F (37.2 C)   98.2 F (36.8 C)  TempSrc: Oral   Oral  Resp: 19  18 18   Height:      Weight:    72.031 kg (158 lb 12.8 oz)  SpO2: 97%  98% 98%    Intake/Output Summary (Last 24 hours) at 07/15/12 0816 Last data filed at 07/15/12 8657  Gross per 24 hour  Intake    549 ml  Output   1650 ml  Net  -1101 ml   Filed Weights   07/13/12 0500 07/14/12 0532 07/15/12 0458  Weight: 71.5 kg (157 lb 10.1 oz) 72 kg (158 lb 11.7 oz) 72.031 kg (158 lb 12.8 oz)    Exam:   General exam: Appears comfortable.  Respiratory system: with bibasilar crackles. No increased work of breathing.  Cardiovascular system: S1 and S2 heard, regular rate and rhythm. No JVD or murmurs. Telemetry shows sinus rhythm. 1+ bilateral pitting edema.  Gastrointestinal system: Abdomen is nondistended, soft and nontender. Normal bowel sounds heard.  Central nervous system: Alert and oriented. No focal neurological deficits.  Extremities: Symmetric 5 x 5 power. Improved swelling in both lower est, erythema R foot   Data Reviewed: Basic Metabolic Panel:  Lab 07/14/12 8469 07/13/12  0450 07/12/12 0139 07/11/12 1943  NA 135 135 134* 133*  K 4.7 4.3 4.4 4.2  CL 100 101 99 97  CO2 25 27 24 26   GLUCOSE 119* 135* 240* 199*  BUN 27* 25* 26* 27*  CREATININE 1.22* 1.24* 1.12* 1.17*  CALCIUM  11.1* 11.2* 11.0* 11.3*  MG -- -- -- --  PHOS -- -- -- --   Liver Function Tests:  Lab 07/12/12 0139  AST 14  ALT 16  ALKPHOS 199*  BILITOT 0.4  PROT 6.9  ALBUMIN 2.6*   No results found for this basename: LIPASE:5,AMYLASE:5 in the last 168 hours No results found for this basename: AMMONIA:5 in the last 168 hours CBC:  Lab 07/14/12 0520 07/12/12 0139 07/11/12 1943 07/09/12 0519  WBC 8.4 8.1 8.9 9.6  NEUTROABS -- 5.8 6.1 --  HGB 9.0* 8.9* 9.6* 9.2*  HCT 28.7* 28.1* 29.0* 29.4*  MCV 80.6 79.2 80.1 81.2  PLT 386 364 386 321   Cardiac Enzymes:  Lab 07/12/12 1334 07/12/12 0751 07/12/12 0139  CKTOTAL -- -- --  CKMB -- -- --  CKMBINDEX -- -- --  TROPONINI <0.30 <0.30 <0.30   BNP (last 3 results)  Basename 07/13/12 0450 07/12/12 0751 07/11/12 2001  PROBNP 8924.0* 11428.0* 10853.0*   CBG:  Lab 07/15/12 0558 07/14/12 2051 07/14/12 1610 07/14/12 1128 07/14/12 0617  GLUCAP 159* 144* 135* 104* 122*    Recent Results (from the past 240 hour(s))  SURGICAL PCR SCREEN     Status: Normal   Collection Time   07/06/12  9:23 PM      Component Value Range Status Comment   MRSA, PCR NEGATIVE  NEGATIVE Final    Staphylococcus aureus NEGATIVE  NEGATIVE Final      Studies: No results found.  Scheduled Meds:    . aspirin EC  81 mg Oral Daily  . atorvastatin  10 mg Oral q1800  . docusate sodium  100 mg Oral BID  . feeding supplement  237 mL Oral BID BM  . furosemide  40 mg Intravenous Daily  . insulin aspart  0-9 Units Subcutaneous TID WC  . insulin aspart protamine-insulin aspart  12 Units Subcutaneous BID WC  . isosorbide mononitrate  30 mg Oral Daily  . metoprolol succinate  25 mg Oral Daily  . pantoprazole  40 mg Oral Q0600  . polyethylene glycol  17 g Oral BID   . ranolazine  500 mg Oral BID  . sodium chloride  3 mL Intravenous Q12H  . sodium chloride  3 mL Intravenous Q12H  . spironolactone  12.5 mg Oral BID  . warfarin  5 mg Oral q1800  . Warfarin - Pharmacist Dosing Inpatient   Does not apply q1800   Continuous Infusions:   Principal Problem:  *Acute on chronic combined systolic and diastolic HF (heart failure), NYHA class 3 - exacerbated by Afib RVR Active Problems:  LBBB (left bundle branch block)  Acute systolic CHF, just discharged 09/81/19  DVT Jun 21 2012  DM, Type 2 IDDM  Acute renal insufficiency, transient 06/24/12- after diuresis  Ischemic cardiomyopathy, EF 25%  Popliteal artery thrombosis, 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 She needs CABG  Chronic anticoagulation, discharged on Coumadin 07/09/12  PAF, on admission 07/12/12  Anemia    Time spent: 30 minutes    [facility]  [facility] Pager [phone] . If 8PM-8AM, please contact night-coverage at [email or web address], password [facility] 07/15/2012, 8:16 AM  LOS: 4 days

## 2012-07-16 LAB — GLUCOSE, CAPILLARY: Glucose-Capillary: 142 mg/dL — ABNORMAL HIGH (ref 70–99)

## 2012-07-16 LAB — BASIC METABOLIC PANEL
BUN: 37 mg/dL — ABNORMAL HIGH (ref 6–23)
CO2: 28 mEq/L (ref 19–32)
Calcium: 11.4 mg/dL — ABNORMAL HIGH (ref 8.4–10.5)
Chloride: 99 mEq/L (ref 96–112)
Creatinine, Ser: 1.37 mg/dL — ABNORMAL HIGH (ref 0.50–1.10)
GFR calc Af Amer: 46 mL/min — ABNORMAL LOW (ref >90)
GFR calc non Af Amer: 40 mL/min — ABNORMAL LOW (ref >90)
Glucose, Bld: 157 mg/dL — ABNORMAL HIGH (ref 70–99)
Potassium: 5.4 mEq/L — ABNORMAL HIGH (ref 3.5–5.1)
Sodium: 135 mEq/L (ref 135–145)

## 2012-07-16 LAB — PRO B NATRIURETIC PEPTIDE: Pro B Natriuretic peptide (BNP): 4443 pg/mL — ABNORMAL HIGH (ref 0–125)

## 2012-07-16 LAB — PROTIME-INR
INR: 3.34 — ABNORMAL HIGH (ref 0.00–1.49)
Prothrombin Time: 32 seconds — ABNORMAL HIGH (ref 11.6–15.2)

## 2012-07-16 MED ORDER — WARFARIN 1.25 MG HALF TABLET
1.2500 mg | ORAL_TABLET | Freq: Once | ORAL | Status: AC
  Administered 2012-07-16: 1.25 mg via ORAL
  Filled 2012-07-16: qty 1

## 2012-07-16 NOTE — Progress Notes (Signed)
Subjective:   Objective: Vital signs in last 24 hours: Temp:  [97.7 F (36.5 C)-98 F (36.7 C)] 97.9 F (36.6 C) (11/23 0526) Pulse Rate:  [75-78] 76  (11/23 0526) Resp:  [18-19] 18  (11/23 0526) BP: (92-102)/(45-66) 102/66 mmHg (11/23 0526) SpO2:  [93 %-99 %] 93 % (11/23 0526) Weight:  [70.8 kg (156 lb 1.4 oz)] 70.8 kg (156 lb 1.4 oz) (11/23 0526) Weight change: -1.231 kg (-2 lb 11.4 oz) Last BM Date: 07/12/12 Intake/Output from previous day: -699 11/22 0701 - 11/23 0700 In: 951 [P.O.:940; I.V.:3; IV Piggyback:8] Out: 1650 [Urine:1650] Intake/Output this shift:    PE: per Dr. Allyson Sabal:   Lab Results:  [facility] 07/14/12 0520  WBC 8.4  HGB 9.0*  HCT 28.7*  PLT 386   BMET  Basename 07/16/12 0514 07/14/12 0520  NA 135 135  K 5.4* 4.7  CL 99 100  CO2 28 25  GLUCOSE 157* 119*  BUN 37* 27*  CREATININE 1.37* 1.22*  CALCIUM 11.4* 11.1*   No results found for this basename: TROPONINI:2,CK,MB:2 in the last 72 hours  No results found for this basename: CHOL, HDL, LDLCALC, LDLDIRECT, TRIG, CHOLHDL   Lab Results  Component Value Date   HGBA1C 9.0* 06/27/2012     Lab Results  Component Value Date   TSH 3.371 07/12/2012      Studies/Results: No results found.  Medications: I have reviewed the patient's current medications. Scheduled Meds:   . aspirin EC  81 mg Oral Daily  . atorvastatin  10 mg Oral q1800  . carvedilol  6.25 mg Oral BID WC  . docusate sodium  100 mg Oral BID  . feeding supplement  237 mL Oral BID BM  . furosemide  40 mg Intravenous Daily  . hydrALAZINE  10 mg Oral Q8H  . insulin aspart  0-9 Units Subcutaneous TID WC  . insulin aspart protamine-insulin aspart  12 Units Subcutaneous BID WC  . isosorbide mononitrate  30 mg Oral Daily  . pantoprazole  40 mg Oral Q0600  . polyethylene glycol  17 g Oral BID  . ranolazine  500 mg Oral BID  . sodium chloride  3 mL Intravenous Q12H  . spironolactone  12.5 mg Oral BID  . warfarin  5 mg Oral q1800  .  Warfarin - Pharmacist Dosing Inpatient   Does not apply q1800  . [DISCONTINUED] metoprolol succinate  25 mg Oral Daily  . [DISCONTINUED] sodium chloride  3 mL Intravenous Q12H   Continuous Infusions:  PRN Meds:.acetaminophen, acetaminophen, alum & mag hydroxide-simeth, LORazepam, ondansetron (ZOFRAN) IV, ondansetron, traMADol, zolpidem  Assessment/Plan: Principal Problem:  *Acute on chronic combined systolic and diastolic HF (heart failure), NYHA class 3 - exacerbated by Afib RVR Active Problems:  LBBB (left bundle branch block)  Acute systolic CHF, just discharged 16/10/96  DVT Jun 21 2012  DM, Type 2 IDDM  Acute renal insufficiency, transient 06/24/12- after diuresis  Ischemic cardiomyopathy, EF 25%  Popliteal artery thrombosis, 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 She needs CABG  Chronic anticoagulation, discharged on Coumadin 07/09/12  PAF, on admission 07/12/12  Anemia  PLAN: INR elevated, followed by pharmacy, K+ level elevated along with cr.  Pro bnp 4,443 down from 11,428 Dr. Allyson Sabal to see and discuss plan.        LOS: 5 days   [name] [name],[name] [name] 07/16/2012, 10:22 AM   Agree with note written by Nada Boozer RNP  Pt re admitted 1 day after D/C home last week.  3 VD with severe LV dysfunction, PVOD (arterial and venous). Meds adjusted yesterday. Ultimately requires CABG (PVT), and Dr. Jerilee Field seeing for right popliteal artery occlusion. Dr. Wonda Cheng changed metop to coreg. Pt needs PT/OT. On coumadin for DVTs.  Runell Gess 07/16/2012 10:54 AM

## 2012-07-16 NOTE — Progress Notes (Signed)
[facility]  PROGRESS NOTE  AMIRE ABRAM ZOX:[id number] DOB: [birth year] DOA: 07/11/2012  PCP: No primary provider on file.  HPI  66 y.o. female just discharged after being hospitalized 06/21/12-07/09/12. We saw her in consult. She has severe 3V CAD and an ischemic cardiomyopathy as well as recent Rt LE DVT, and Rt popliteal artery occlusion. She was not felt to be a candidate at this time for CABG and the plan was to treat her multiple medical issues and then plan elective CABG at a later date. She was discjharged on Coumadin. She presented late 07/11/12 with SOB, her was BNP 10K and she also had PAF in the ER (NSR now).  Assessment/Plan:   1. Acute on chronic systolic heart failure: Secondary to ischemic cardiomyopathy. Clinically improving after diuresis-per Cardiology, ACE/ARB on hold due to soft BP and slight bump in craetinine, 3 vessel CAD timing of CABG per CARDs  2. Right lower extremity DVT and right popliteal artery occlusion: Continue anticoagulation-Coumadin per pharmacy. INR therapeutic  was seen by [facility] last admission and was not felt to be candidate for intervention now due to concomitant DVT, CHF and CAD, will need FU arteriogram at some point.  3. Type 2 diabetes mellitus: was not able to afford lantus, was transitioned to Insulin 70/30 with meals, ssi, Pt told diabetic coordinator that she will not take any insulin at home and tells me this am that she could consider it if affordable. Told her that it would cost $26/vial and 1 vial should last her > 1 month, pt is not able to tell me know whether she can afford this or not, if she could would discharge on 12Units BID of Insulin 70/30 with meals as she is getting here, otherwise on Metformin 500mg  BID and Amaryl 40mg  daily with breakfast, i will follow peripherally  4. Anemia: Fairly stable. Follow CBC daily. No active bleeding.   5. Recent cellulitis of right lower extremity: Resolved.  6. Hypercalcemia likely  secondary to Primary hyperparathyroidism: Intact PTH elevated 129 in 11/13, has had asymptomatic hypercalcemia since 2009, will need Endocrine FU after discharge to assess parathyroid gland.  7. 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 Follow BMP daily.  Code Status: Full  Family Communication: Discussed with patient  Disposition Plan: Home when medically stable    Consultants:  Cardiology Procedures:  None Antibiotics:  None HPI/Subjective:  R leg pain improving, breathing ok, doesn't think she's ready to go home, and again undecided on Insulin  Objective:  Filed Vitals:    07/15/12 1432  07/15/12 2027  07/15/12 2252  07/16/12 0526   BP:  92/45  92/62  101/51  102/66   Pulse:  78  78   76   Temp:  98 F (36.7 C)  97.7 F (36.5 C)   97.9 F (36.6 C)   TempSrc:  Oral  Oral   Oral   Resp:  18  19   18    Height:       Weight:     70.8 kg (156 lb 1.4 oz)   SpO2:  98%  99%   93%     Intake/Output Summary (Last 24 hours) at 07/16/12 0739 Last data filed at 07/16/12 0613   Gross per 24 hour   Intake  951 ml   Output  1650 ml   Net  -699 ml    Filed Weights    07/14/12 0532  07/15/12 0458  07/16/12 0526   Weight:  72  kg (158 lb 11.7 oz)  72.031 kg (158 lb 12.8 oz)  70.8 kg (156 lb 1.4 oz)    Exam:  General exam: Appears comfortable.  Respiratory system: with fine bibasilar crackles. No increased work of breathing.  Cardiovascular system: S1 and S2 heard, regular rate and rhythm. No JVD or murmurs. Telemetry shows sinus rhythm. 1+ bilateral pitting edema.  Gastrointestinal system: Abdomen is nondistended, soft and nontender. Normal bowel sounds heard.  Central nervous system: Alert and oriented. No focal neurological deficits.  Extremities: Symmetric 5 x 5 power. Improved swelling in both lower est, mild erythema R foot Data Reviewed:  Basic Metabolic Panel:   Lab  07/16/12 0514  07/14/12 0520  07/13/12 0450  07/12/12 0139  07/11/12 1943   NA  135  135   135  134*  133*   K  5.4*  4.7  4.3  4.4  4.2   CL  99  100  101  99  97   CO2  28  25  27  24  26    GLUCOSE  157*  119*  135*  240*  199*   BUN  37*  27*  25*  26*  27*   CREATININE  1.37*  1.22*  1.24*  1.12*  1.17*   CALCIUM  11.4*  11.1*  11.2*  11.0*  11.3*   MG  --  --  --  --  --   PHOS  --  --  --  --  --    Liver Function Tests:   Lab  07/12/12 0139   AST  14   ALT  16   ALKPHOS  199*   BILITOT  0.4   PROT  6.9   ALBUMIN  2.6*    No results found for this basename: LIPASE:5,AMYLASE:5 in the last 168 hours  No results found for this basename: AMMONIA:5 in the last 168 hours  CBC:   Lab  07/14/12 0520  07/12/12 0139  07/11/12 1943   WBC  8.4  8.1  8.9   NEUTROABS  --  5.8  6.1   HGB  9.0*  8.9*  9.6*   HCT  28.7*  28.1*  29.0*   MCV  80.6  79.2  80.1   PLT  386  364  386    Cardiac Enzymes:   Lab  07/12/12 1334  07/12/12 0751  07/12/12 0139   CKTOTAL  --  --  --   CKMB  --  --  --   CKMBINDEX  --  --  --   TROPONINI  <0.30  <0.30  <0.30    BNP (last 3 results)   Basename  07/16/12 0514  07/13/12 0450  07/12/12 0751   PROBNP  4443.0*  8924.0*  11428.0*    CBG:   Lab  07/16/12 0557  07/15/12 2102  07/15/12 1552  07/15/12 1112  07/15/12 0558   GLUCAP  153*  133*  165*  81  159*    Recent Results (from the past 240 hour(s))   SURGICAL PCR SCREEN Status: Normal    Collection Time    07/06/12 9:23 PM   Component  Value  Range  Status  Comment    MRSA, PCR  NEGATIVE  NEGATIVE  Final     Staphylococcus aureus  NEGATIVE  NEGATIVE  Final     Studies:  No results found.  Scheduled Meds:   .  aspirin EC  81 mg  Oral  Daily   .  atorvastatin  10 mg  Oral  q1800   .  carvedilol  6.25 mg  Oral  BID WC   .  docusate sodium  100 mg  Oral  BID   .  feeding supplement  237 mL  Oral  BID BM   .  furosemide  40 mg  Intravenous  Daily   .  hydrALAZINE  10 mg  Oral  Q8H   .  insulin aspart  0-9 Units  Subcutaneous  TID WC   .  insulin aspart protamine-insulin  aspart  12 Units  Subcutaneous  BID WC   .  isosorbide mononitrate  30 mg  Oral  Daily   .  pantoprazole  40 mg  Oral  Q0600   .  polyethylene glycol  17 g  Oral  BID   .  ranolazine  500 mg  Oral  BID   .  sodium chloride  3 mL  Intravenous  Q12H   .  spironolactone  12.5 mg  Oral  BID   .  warfarin  5 mg  Oral  q1800   .  Warfarin - Pharmacist Dosing Inpatient   Does not apply  q1800   .  [DISCONTINUED] metoprolol succinate  25 mg  Oral  Daily   .  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 She needs CABG  Chronic anticoagulation, discharged on Coumadin 07/09/12  PAF, on admission 07/12/12  Anemia   Time spent: 30 minutes  [facility]  [facility]  Pager [phone] . If 8PM-8AM, please contact night-coverage at [email or web address], password [facility]  07/16/2012, 7:39 AM LOS: 5 days

## 2012-07-16 NOTE — Progress Notes (Deleted)
[facility] PROGRESS NOTE  [name] [name] WUJ:[id number] DOB: [birth year] DOA: 07/11/2012 PCP: No primary provider on file.  HPI 66 y.o. female just discharged after being hospitalized 06/21/12-07/09/12. We saw her in consult. She has severe 3V CAD and an ischemic cardiomyopathy as well as recent Rt LE DVT, and Rt popliteal artery occlusion. She was not felt to be a candidate at this time for CABG and the plan was to treat her multiple medical issues and then plan elective CABG at a later date. She was discjharged on Coumadin. She presented late 07/11/12 with SOB, her was BNP 10K and she also had PAF in the ER (NSR now).   Assessment/Plan: 1. Acute on chronic systolic heart failure: Secondary to ischemic cardiomyopathy. Clinically improving after diuresis-continue IV Lasix and oral Aldactone. Cardiology was consulted and following, 3 vessel CAD timing of CABG per CARDs, ACE and ARB hold due to soft BP 2. Right lower extremity DVT and right popliteal artery occlusion: Continue anticoagulation-Coumadin per pharmacy. INR therapeutic at 2.65-anticoagulated, was seen by [facility] last admission and was not felt to be candidate for intervention now due to concomitant DVT, CHF and CAD, will need FU arteriogram at some point. 3. Type 2 diabetes mellitus: was not able to afford lantus, transitioned to Insulin 70/30 with meals, ssi, diabetiuc coordinator to meet with her today and see if she would be able to afford 70/30 insulin otherwise only options would be metformin/glyburide at DC 4. Anemia: Fairly stable. Follow CBC daily. No active bleeding.  5. Recent cellulitis of right lower extremity: Resolved. 6. Hypercalcemia possibly secondary to Primary hyperparathyroidism: asymptomatic, intact PHT elevated 11/13, has has asymptomatic hypercalcemia since 2209, will need FU with endocrinology at DC to eval parathyroid gland. 7. 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 Follow BMP  daily.  Code Status: Full Family Communication: Discussed with patient Disposition Plan:  Home when medically stable   Consultants:  Cardiology  Procedures:  None  Antibiotics:  None  HPI/Subjective: Dyspnea is better, swelling improved significantly,  Frustrated/angry about cold breakfast. Denies chest pain.  Objective: Filed Vitals:   07/15/12 1432 07/15/12 2027 07/15/12 2252 07/16/12 0526  BP: 92/45 92/62 101/51 102/66  Pulse: 78 78  76  Temp: 98 F (36.7 C) 97.7 F (36.5 C)  97.9 F (36.6 C)  TempSrc: Oral Oral  Oral  Resp: 18 19  18   Height:      Weight:    70.8 kg (156 lb 1.4 oz)  SpO2: 98% 99%  93%    Intake/Output Summary (Last 24 hours) at 07/16/12 0739 Last data filed at 07/16/12 9562  Gross per 24 hour  Intake    951 ml  Output   1650 ml  Net   -699 ml   Filed Weights   07/14/12 0532 07/15/12 0458 07/16/12 0526  Weight: 72 kg (158 lb 11.7 oz) 72.031 kg (158 lb 12.8 oz) 70.8 kg (156 lb 1.4 oz)    Exam:   General exam: Appears comfortable.  Respiratory system: with bibasilar crackles. No increased work of breathing.  Cardiovascular system: S1 and S2 heard, regular rate and rhythm. No JVD or murmurs. Telemetry shows sinus rhythm. 1+ bilateral pitting edema.  Gastrointestinal system: Abdomen is nondistended, soft and nontender. Normal bowel sounds heard.  Central nervous system: Alert and oriented. No focal neurological deficits.  Extremities: Symmetric 5 x 5 power. Improved swelling in both lower est, erythema R foot   Data Reviewed: Basic Metabolic Panel:  Lab 07/16/12 1308  07/14/12 0520 07/13/12 0450 07/12/12 0139 07/11/12 1943  NA 135 135 135 134* 133*  K 5.4* 4.7 4.3 4.4 4.2  CL 99 100 101 99 97  CO2 28 25 27 24 26   GLUCOSE 157* 119* 135* 240* 199*  BUN 37* 27* 25* 26* 27*  CREATININE 1.37* 1.22* 1.24* 1.12* 1.17*  CALCIUM 11.4* 11.1* 11.2* 11.0* 11.3*  MG -- -- -- -- --  PHOS -- -- -- -- --   Liver Function Tests:  Lab  07/12/12 0139  AST 14  ALT 16  ALKPHOS 199*  BILITOT 0.4  PROT 6.9  ALBUMIN 2.6*   No results found for this basename: LIPASE:5,AMYLASE:5 in the last 168 hours No results found for this basename: AMMONIA:5 in the last 168 hours CBC:  Lab 07/14/12 0520 07/12/12 0139 07/11/12 1943  WBC 8.4 8.1 8.9  NEUTROABS -- 5.8 6.1  HGB 9.0* 8.9* 9.6*  HCT 28.7* 28.1* 29.0*  MCV 80.6 79.2 80.1  PLT 386 364 386   Cardiac Enzymes:  Lab 07/12/12 1334 07/12/12 0751 07/12/12 0139  CKTOTAL -- -- --  CKMB -- -- --  CKMBINDEX -- -- --  TROPONINI <0.30 <0.30 <0.30   BNP (last 3 results)  Basename 07/16/12 0514 07/13/12 0450 07/12/12 0751  PROBNP 4443.0* 8924.0* 11428.0*   CBG:  Lab 07/16/12 0557 07/15/12 2102 07/15/12 1552 07/15/12 1112 07/15/12 0558  GLUCAP 153* 133* 165* 81 159*    Recent Results (from the past 240 hour(s))  SURGICAL PCR SCREEN     Status: Normal   Collection Time   07/06/12  9:23 PM      Component Value Range Status Comment   MRSA, PCR NEGATIVE  NEGATIVE Final    Staphylococcus aureus NEGATIVE  NEGATIVE Final      Studies: No results found.  Scheduled Meds:    . aspirin EC  81 mg Oral Daily  . atorvastatin  10 mg Oral q1800  . carvedilol  6.25 mg Oral BID WC  . docusate sodium  100 mg Oral BID  . feeding supplement  237 mL Oral BID BM  . furosemide  40 mg Intravenous Daily  . hydrALAZINE  10 mg Oral Q8H  . insulin aspart  0-9 Units Subcutaneous TID WC  . insulin aspart protamine-insulin aspart  12 Units Subcutaneous BID WC  . isosorbide mononitrate  30 mg Oral Daily  . pantoprazole  40 mg Oral Q0600  . polyethylene glycol  17 g Oral BID  . ranolazine  500 mg Oral BID  . sodium chloride  3 mL Intravenous Q12H  . spironolactone  12.5 mg Oral BID  . warfarin  5 mg Oral q1800  . Warfarin - Pharmacist Dosing Inpatient   Does not apply q1800  . [DISCONTINUED] metoprolol succinate  25 mg Oral Daily  . [DISCONTINUED] sodium chloride  3 mL Intravenous Q12H    Continuous Infusions:   Principal Problem:  *Acute on chronic combined systolic and diastolic HF (heart failure), NYHA class 3 - exacerbated by Afib RVR Active Problems:  LBBB (left bundle branch block)  Acute systolic CHF, just discharged 11/91/47  DVT Jun 21 2012  DM, Type 2 IDDM  Acute renal insufficiency, transient 06/24/12- after diuresis  Ischemic cardiomyopathy, EF 25%  Popliteal artery thrombosis, 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 She needs CABG  Chronic anticoagulation, discharged on Coumadin 07/09/12  PAF, on admission 07/12/12  Anemia    Time spent: 30 minutes    Lucrezia Dehne  [facility]  Pager [phone] . If 8PM-8AM, please contact night-coverage at [email or web address], password [facility] 07/16/2012, 7:39 AM  LOS: 5 days             [facility] PROGRESS NOTE  [name] [name] BMW:[id number] DOB: 01-Oct-1945 DOA: 07/11/2012 PCP: No primary provider on file.  HPI 65 y.o. female just discharged after being hospitalized 06/21/12-07/09/12. We saw her in consult. She has severe 3V CAD and an ischemic cardiomyopathy as well as recent Rt LE DVT, and Rt popliteal artery occlusion. She was not felt to be a candidate at this time for CABG and the plan was to treat her multiple medical issues and then plan elective CABG at a later date. She was discjharged on Coumadin. She presented late 07/11/12 with SOB, her was BNP 10K and she also had PAF in the ER (NSR now).   Assessment/Plan: 8. Acute on chronic systolic heart failure: Secondary to ischemic cardiomyopathy. Clinically improving after diuresis-per Cardiology, ACE/ARB on hold due to soft BP and slight bump in craetinine, 3 vessel CAD timing of CABG per CARDs 9. Right lower extremity DVT and right popliteal artery occlusion: Continue anticoagulation-Coumadin per pharmacy. INR therapeutic  10. Type 2 diabetes mellitus: was not able to afford lantus, was transitioned to Insulin 70/30 with  meals, ssi, Pt told diabetic coordinator that she will not take any insulin at home and tells me this am that she could consider it if affordable. Told her that it would cost $26/vial and 1 vial should last her > 1 month, pt is not able to tell me know whether she can afford this or not, if she could would discharge on 12Units BID of Insulin 70/30 with meals as she is getting here, otherwise on Metformin 500mg  BID and Amaryl 40mg  daily with breakfast, i will follow peripherally 11. Anemia: Fairly stable. Follow CBC daily. No active bleeding.  12. Recent cellulitis of right lower extremity: Resolved. 13. Hypercalcemia likely secondary to Primary hyperparathyroidism: Intact PTH elevated 129 in 11/13, has had asymptomatic hypercalcemia since 2009, will need Endocrine FU after discharge to assess parathyroid gland. 14. 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 Follow BMP daily.  Code Status: Full Family Communication: Discussed with patient Disposition Plan:  Home when medically stable   Consultants:  Cardiology  Procedures:  None  Antibiotics:  None  HPI/Subjective: R leg pain improving, breathing ok, doesn't think she's ready to go home, and again undecided on Insulin  Objective: Filed Vitals:   07/15/12 1432 07/15/12 2027 07/15/12 2252 07/16/12 0526  BP: 92/45 92/62 101/51 102/66  Pulse: 78 78  76  Temp: 98 F (36.7 C) 97.7 F (36.5 C)  97.9 F (36.6 C)  TempSrc: Oral Oral  Oral  Resp: 18 19  18   Height:      Weight:    70.8 kg (156 lb 1.4 oz)  SpO2: 98% 99%  93%    Intake/Output Summary (Last 24 hours) at 07/16/12 0739 Last data filed at 07/16/12 2725  Gross per 24 hour  Intake    951 ml  Output   1650 ml  Net   -699 ml   Filed Weights   07/14/12 0532 07/15/12 0458 07/16/12 0526  Weight: 72 kg (158 lb 11.7 oz) 72.031 kg (158 lb 12.8 oz) 70.8 kg (156 lb 1.4 oz)    Exam:   General exam: Appears comfortable.  Respiratory system: with fine  bibasilar crackles. No increased work of breathing.  Cardiovascular system: S1 and S2 heard, regular rate  and rhythm. No JVD or murmurs. Telemetry shows sinus rhythm. 1+ bilateral pitting edema.  Gastrointestinal system: Abdomen is nondistended, soft and nontender. Normal bowel sounds heard.  Central nervous system: Alert and oriented. No focal neurological deficits.  Extremities: Symmetric 5 x 5 power. Improved swelling in both lower est, mild erythema R foot   Data Reviewed: Basic Metabolic Panel:  Lab 07/16/12 1610 07/14/12 0520 07/13/12 0450 07/12/12 0139 07/11/12 1943  NA 135 135 135 134* 133*  K 5.4* 4.7 4.3 4.4 4.2  CL 99 100 101 99 97  CO2 28 25 27 24 26   GLUCOSE 157* 119* 135* 240* 199*  BUN 37* 27* 25* 26* 27*  CREATININE 1.37* 1.22* 1.24* 1.12* 1.17*  CALCIUM 11.4* 11.1* 11.2* 11.0* 11.3*  MG -- -- -- -- --  PHOS -- -- -- -- --   Liver Function Tests:  Lab 07/12/12 0139  AST 14  ALT 16  ALKPHOS 199*  BILITOT 0.4  PROT 6.9  ALBUMIN 2.6*   No results found for this basename: LIPASE:5,AMYLASE:5 in the last 168 hours No results found for this basename: AMMONIA:5 in the last 168 hours CBC:  Lab 07/14/12 0520 07/12/12 0139 07/11/12 1943  WBC 8.4 8.1 8.9  NEUTROABS -- 5.8 6.1  HGB 9.0* 8.9* 9.6*  HCT 28.7* 28.1* 29.0*  MCV 80.6 79.2 80.1  PLT 386 364 386   Cardiac Enzymes:  Lab 07/12/12 1334 07/12/12 0751 07/12/12 0139  CKTOTAL -- -- --  CKMB -- -- --  CKMBINDEX -- -- --  TROPONINI <0.30 <0.30 <0.30   BNP (last 3 results)  Basename 07/16/12 0514 07/13/12 0450 07/12/12 0751  PROBNP 4443.0* 8924.0* 11428.0*   CBG:  Lab 07/16/12 0557 07/15/12 2102 07/15/12 1552 07/15/12 1112 07/15/12 0558  GLUCAP 153* 133* 165* 81 159*    Recent Results (from the past 240 hour(s))  SURGICAL PCR SCREEN     Status: Normal   Collection Time   07/06/12  9:23 PM      Component Value Range Status Comment   MRSA, PCR NEGATIVE  NEGATIVE Final    Staphylococcus aureus  NEGATIVE  NEGATIVE Final      Studies: No results found.  Scheduled Meds:    . aspirin EC  81 mg Oral Daily  . atorvastatin  10 mg Oral q1800  . carvedilol  6.25 mg Oral BID WC  . docusate sodium  100 mg Oral BID  . feeding supplement  237 mL Oral BID BM  . furosemide  40 mg Intravenous Daily  . hydrALAZINE  10 mg Oral Q8H  . insulin aspart  0-9 Units Subcutaneous TID WC  . insulin aspart protamine-insulin aspart  12 Units Subcutaneous BID WC  . isosorbide mononitrate  30 mg Oral Daily  . pantoprazole  40 mg Oral Q0600  . polyethylene glycol  17 g Oral BID  . ranolazine  500 mg Oral BID  . sodium chloride  3 mL Intravenous Q12H  . spironolactone  12.5 mg Oral BID  . warfarin  5 mg Oral q1800  . Warfarin - Pharmacist Dosing Inpatient   Does not apply q1800  . [DISCONTINUED] metoprolol succinate  25 mg Oral Daily  . [DISCONTINUED] sodium chloride  3 mL Intravenous Q12H   Continuous Infusions:   Principal Problem:  *Acute on chronic combined systolic and diastolic HF (heart failure), NYHA class 3 - exacerbated by Afib RVR Active Problems:  LBBB (left bundle branch block)  Acute systolic CHF, just discharged 96/04/54  DVT  Jun 21 2012  DM, Type 2 IDDM  Acute renal insufficiency, transient 06/24/12- after diuresis  Ischemic cardiomyopathy, EF 25%  Popliteal artery thrombosis, 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 She needs CABG  Chronic anticoagulation, discharged on Coumadin 07/09/12  PAF, on admission 07/12/12  Anemia    Time spent: 30 minutes    [facility]  [facility] Pager [phone] . If 8PM-8AM, please contact night-coverage at [email or web address], password [facility] 07/16/2012, 7:39 AM  LOS: 5 days

## 2012-07-16 NOTE — Progress Notes (Signed)
ANTICOAGULATION CONSULT NOTE - Follow Up Consult  Pharmacy Consult for Coumadin Indication: hx DVT  Allergies  Allergen Reactions  . Penicillins Anaphylaxis  . Sulfa Antibiotics Anaphylaxis   Vital Signs: Temp: 97.9 F (36.6 C) (11/23 0526) Temp src: Oral (11/23 0526) BP: 102/66 mmHg (11/23 0526) Pulse Rate: 76  (11/23 0526)  Labs:  Alvira Philips 07/16/12 0514 07/15/12 0505 07/14/12 0520  HGB -- -- 9.0*  HCT -- -- 28.7*  PLT -- -- 386  APTT -- -- --  LABPROT 32.0* 28.4* 25.2*  INR 3.34* 2.84* 2.42*  HEPARINUNFRC -- -- --  CREATININE 1.37* -- 1.22*  CKTOTAL -- -- --  CKMB -- -- --  TROPONINI -- -- --    Estimated Creatinine Clearance: 38.3 ml/min (by C-G formula based on Cr of 1.37).  Assessment: 65yof continues on coumadin for hx DVT of lower extremities (06/22/12) with R popliteal artery thrombus (06/29/12). INR supratherapeutic on home regimen of 5mg  daily with large increases over the last 2 days. No interactions seen. No bleeding noted. No CBC today. Will likely need to adjust weekly dose.  Goal of Therapy:  INR 2-3 Monitor platelets by anticoagulation protocol: Yes   Plan:  1) Reduce Coumadin dose to 1.25 mg po tonight 2) Follow up INR in AM  [facility], [place].D., BCPS Clinical Pharmacist Pager: [phone] 07/16/2012 1:53 PM

## 2012-07-16 NOTE — Progress Notes (Signed)
Cardiac Rehab.  Visit to patient, orientation to Cardiac Rehab Phase 1.  She states she does not want to walk at this time, would like to get bath and cleaned up after shift change and walk after that.  We will follow up on Monday.   Cathie Olden RN

## 2012-07-17 LAB — BASIC METABOLIC PANEL
BUN: 40 mg/dL — ABNORMAL HIGH (ref 6–23)
Calcium: 11.7 mg/dL — ABNORMAL HIGH (ref 8.4–10.5)
Creatinine, Ser: 1.73 mg/dL — ABNORMAL HIGH (ref 0.50–1.10)
GFR calc non Af Amer: 30 mL/min — ABNORMAL LOW (ref >90)
Glucose, Bld: 142 mg/dL — ABNORMAL HIGH (ref 70–99)
Potassium: 5.1 mEq/L (ref 3.5–5.1)

## 2012-07-17 LAB — GLUCOSE, CAPILLARY: Glucose-Capillary: 102 mg/dL — ABNORMAL HIGH (ref 70–99)

## 2012-07-17 LAB — PROTIME-INR: Prothrombin Time: 31.1 seconds — ABNORMAL HIGH (ref 11.6–15.2)

## 2012-07-17 MED ORDER — SODIUM POLYSTYRENE SULFONATE 15 GM/60ML PO SUSP
30.0000 g | Freq: Once | ORAL | Status: AC
  Administered 2012-07-17: 30 g via ORAL
  Filled 2012-07-17: qty 120

## 2012-07-17 MED ORDER — HYDRALAZINE HCL 25 MG PO TABS
25.0000 mg | ORAL_TABLET | Freq: Three times a day (TID) | ORAL | Status: DC
  Administered 2012-07-17 – 2012-07-19 (×5): 25 mg via ORAL
  Filled 2012-07-17 (×15): qty 1

## 2012-07-17 MED ORDER — ATORVASTATIN CALCIUM 20 MG PO TABS
20.0000 mg | ORAL_TABLET | Freq: Every day | ORAL | Status: DC
  Administered 2012-07-17 – 2012-07-24 (×8): 20 mg via ORAL
  Filled 2012-07-17 (×9): qty 1

## 2012-07-17 MED ORDER — WARFARIN SODIUM 2.5 MG PO TABS
2.5000 mg | ORAL_TABLET | Freq: Once | ORAL | Status: AC
  Administered 2012-07-17: 2.5 mg via ORAL
  Filled 2012-07-17: qty 1

## 2012-07-17 NOTE — Progress Notes (Signed)
ANTICOAGULATION CONSULT NOTE - Follow Up Consult  Pharmacy Consult for Coumadin Indication: hx DVT  Allergies  Allergen Reactions  . Penicillins Anaphylaxis  . Sulfa Antibiotics Anaphylaxis   Vital Signs: Temp: 98.4 F (36.9 C) (11/24 0514) Temp src: Oral (11/24 0514) BP: 96/55 mmHg (11/24 1054) Pulse Rate: 83  (11/24 0514)  Labs:  Basename 07/17/12 0738 07/16/12 0514 07/15/12 0505  HGB -- -- --  HCT -- -- --  PLT -- -- --  APTT -- -- --  LABPROT 31.1* 32.0* 28.4*  INR 3.21* 3.34* 2.84*  HEPARINUNFRC -- -- --  CREATININE 1.73* 1.37* --  CKTOTAL -- -- --  CKMB -- -- --  TROPONINI -- -- --    Estimated Creatinine Clearance: 32.8 ml/min (by C-G formula based on Cr of 1.73).  Assessment: 65yof continues on Coumadin for hx DVT of lower extremities (06/22/12) with R popliteal artery thrombus (06/29/12). Home dose is 5 mg daily.  INR supratherapeutic this morning but trending down to goal range with lower doses. No interactions seen. No bleeding noted. No CBC today. Will likely need to adjust weekly dose, consider 5 mg daily except 2.5 mg Sun/Tues.  Goal of Therapy:  INR 2-3 Monitor platelets by anticoagulation protocol: Yes   Plan:  1) Coumadin 2.5 mg po tonight 2) Follow up INR in AM  [facility], [place].D., BCPS Clinical Pharmacist Pager: [phone] 07/17/2012 1:21 PM

## 2012-07-17 NOTE — Progress Notes (Signed)
[facility] Progress Note  Subjective:  No chest pain  Objective:   Vital Signs in the last 24 hours: Temp:  [97.7 F (36.5 C)-98.4 F (36.9 C)] 98.4 F (36.9 C) (11/24 0514) Pulse Rate:  [77-85] 83  (11/24 0514) Resp:  [16-20] 16  (11/24 0514) BP: (90-100)/(48-65) 98/55 mmHg (11/24 0514) SpO2:  [91 %-96 %] 91 % (11/24 0514) Weight:  [71.26 kg (157 lb 1.6 oz)] 71.26 kg (157 lb 1.6 oz) (11/24 0514)  Intake/Output from previous day: 11/23 0701 - 11/24 0700 In: 1684 [P.O.:1680; IV Piggyback:4] Out: 1460 [Urine:1460]  Scheduled:   . aspirin EC  81 mg Oral Daily  . atorvastatin  10 mg Oral q1800  . carvedilol  6.25 mg Oral BID WC  . docusate sodium  100 mg Oral BID  . feeding supplement  237 mL Oral BID BM  . furosemide  40 mg Intravenous Daily  . hydrALAZINE  10 mg Oral Q8H  . insulin aspart  0-9 Units Subcutaneous TID WC  . insulin aspart protamine-insulin aspart  12 Units Subcutaneous BID WC  . isosorbide mononitrate  30 mg Oral Daily  . pantoprazole  40 mg Oral Q0600  . polyethylene glycol  17 g Oral BID  . ranolazine  500 mg Oral BID  . sodium chloride  3 mL Intravenous Q12H  . [COMPLETED] sodium polystyrene  30 g Oral Once  . [COMPLETED] warfarin  1.25 mg Oral ONCE-1800  . Warfarin - Pharmacist Dosing Inpatient   Does not apply q1800  . [DISCONTINUED] spironolactone  12.5 mg Oral BID  . [DISCONTINUED] warfarin  5 mg Oral q1800    Physical Exam:   General appearance: alert, cooperative and no distress Neck: no JVD, supple, symmetrical, trachea midline and thyroid not enlarged, symmetric, no tenderness/mass/nodules Lungs: decreased at r base Heart: RRR 1/6 sem Abdomen: soft BS+ Extremities: no significant edema, mild discoloration of r foot.   Rate: 85  Rhythm: normal sinus rhythm  Lab Results:    Basename 07/17/12 0738 07/16/12 1400 07/16/12 0514  NA 135 -- 135  K 5.1 5.6* --  CL 97 -- 99  CO2 30 -- 28  GLUCOSE 142* --  157*  BUN 40* -- 37*  CREATININE 1.73* -- 1.37*   No results found for this basename: TROPONINI:2,CK,MB:2 in the last 72 hours Hepatic Function Panel No results found for this basename: PROT,ALBUMIN,AST,ALT,ALKPHOS,BILITOT,BILIDIR,IBILI in the last 72 hours  Basename 07/17/12 0738  INR 3.21*   BNP (last 3 results)  Basename 07/17/12 0738 07/16/12 0514 07/13/12 0450  PROBNP 4495.0* 4443.0* 8924.0*    Lipid Panel  No results found for this basename: chol, trig, hdl, cholhdl, vldl, ldlcalc     Imaging:  No results found.    Assessment/Plan:   Principal Problem:  *Acute on chronic combined systolic and diastolic HF (heart failure), NYHA class 3 - exacerbated by Afib RVR Active Problems:  LBBB (left bundle branch block)  Acute systolic CHF, just discharged 16/10/96  DVT Jun 21 2012  DM, Type 2 IDDM  Acute renal insufficiency, transient 06/24/12- after diuresis  Ischemic cardiomyopathy, EF 25%  Popliteal artery thrombosis, 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 She needs CABG  Chronic anticoagulation, discharged on Coumadin 07/09/12  PAF, on admission 07/12/12  Anemia   Spironolactone dc's due to increased K. LE edema resolved.  Will titrate hydralazine and nitrates as BP allows. Dspnea with activity. F/u CXR in am.   Lennette Bihari, MD,  [facility] 07/17/2012, 10:25 AM

## 2012-07-17 NOTE — Evaluation (Signed)
Physical Therapy Evaluation Patient Details Name: [name] [name] MRN: [id number] DOB: 06/04/1946 Today's Date: 07/17/2012 Time: 4098-1191 PT Time Calculation (min): 21 min  PT Assessment / Plan / Recommendation Clinical Impression  66 y.o. female just discharged after being hospitalized 06/21/12-07/09/12. She has severe 3V CAD and an ischemic cardiomyopathy as well as recent Rt LE DVT, and Rt popliteal artery occlusion. She was not felt to be a candidate at this time for CABG and the plan was to treat her multiple medical issues and then plan elective CABG at a later date. She was discjharged on Coumadin. Readmitted 07/11/12 with SOB.   Presents to PT today with generalized weakness affecting her balance and independence with gait. Will benefit physical therapy in the acute setting to maxmize mobility and safety in prep for d/c home.     PT Assessment  Patient needs continued PT services    Follow Up Recommendations  Home health PT;Supervision for mobility/OOB    Does the patient have the potential to tolerate intense rehabilitation      Barriers to Discharge        Equipment Recommendations  Rolling walker with 5" wheels    Recommendations for Other Services     Frequency Min 3X/week    Precautions / Restrictions Precautions Precautions: Fall Restrictions Weight Bearing Restrictions: No   Pertinent Vitals/Pain C/o dizziness, constant, does not change with position. BP originally 108/57. Following session 96/55.      Mobility  Bed Mobility Bed Mobility: Not assessed Transfers Transfers: Sit to Stand;Stand to Sit ( 2 trials) Sit to Stand: From bed;With upper extremity assist Stand to Sit: To bed;With upper extremity assist Details for Transfer Assistance: cues for safe hand placement, initially without RW pt unsteady needing to hold onto bed and couldn't stand without upper extremity support; with RW pt able to stabilize herself in standing with verbal cues for  safe hand placement regarding RW Ambulation/Gait Ambulation/Gait Assistance: 4: Min assist;4: Min guard Ambulation Distance (Feet): 40 Feet (20 ft x2) Assistive device: 1 person hand held assist;Rolling walker Ambulation/Gait Assistance Details: ambulated the 1st 20 ft with RUE HHA as well as facilitation around her trunk for stability; ambulated the 2nd 20 ft with RW and improved stabiliy needing only mingaurdA Gait Pattern: Trunk flexed;Wide base of support General Gait Details: staggered gait with decreased balance reactions noted Stairs: No         Exercises General Exercises - Lower Extremity Toe Raises: AROM;Right;10 reps;Seated Heel Raises: AROM;Right;10 reps;Seated   PT Diagnosis: Difficulty walking;Abnormality of gait;Generalized weakness  PT Problem List: Decreased strength;Decreased activity tolerance;Decreased balance;Impaired sensation;Decreased knowledge of use of DME;Cardiopulmonary status limiting activity PT Treatment Interventions: DME instruction;Gait training;Functional mobility training;Therapeutic activities;Stair training;Therapeutic exercise;Balance training;Neuromuscular re-education;Patient/family education   PT Goals Acute Rehab PT Goals PT Goal Formulation: With patient Time For Goal Achievement: 07/24/12 Potential to Achieve Goals: Good Pt will go Supine/Side to Sit: with modified independence PT Goal: Supine/Side to Sit - Progress: Goal set today Pt will go Sit to Supine/Side: with modified independence PT Goal: Sit to Supine/Side - Progress: Goal set today Pt will go Sit to Stand: with modified independence PT Goal: Sit to Stand - Progress: Goal set today Pt will go Stand to Sit: with modified independence PT Goal: Stand to Sit - Progress: Goal set today Pt will Transfer Bed to Chair/Chair to Bed: with modified independence PT Transfer Goal: Bed to Chair/Chair to Bed - Progress: Goal set today Pt will Ambulate: 51 - 150 feet;with modified  independence;with least restrictive assistive device PT Goal: Ambulate - Progress: Goal set today Pt will Go Up / Down Stairs: 1-2 stairs;with min assist PT Goal: Up/Down Stairs - Progress: Goal set today  Visit Information  Last PT Received On: 07/17/12 Assistance Needed: +1    Subjective Data  Subjective: They have all my information in that computer wrong! Sit down! I am going to tell you.  Patient Stated Goal: home   Prior Functioning  Home Living Lives With: Alone (but will be going to stay with estranged husband?) Available Help at Discharge: Family;Available 24 hours/day Type of Home: House Home Access: Stairs to enter [employer] of Steps: 2-3 Home Layout: One level Home Adaptive Equipment: None Additional Comments: made some mention about how her and her husband both lose their balance but hold onto furniture in the house Prior Function Level of Independence: Independent Able to Take Stairs?: Yes Driving: Yes Communication Communication: No difficulties    Cognition  Overall Cognitive Status: Appears within functional limits for tasks assessed/performed Arousal/Alertness: Awake/alert Orientation Level: Appears intact for tasks assessed Behavior During Session: [facility] for tasks performed Cognition - Other Comments: a little eccentric    Extremity/Trunk Assessment Right Upper Extremity Assessment RUE ROM/Strength/Tone: [facility] for tasks assessed Left Upper Extremity Assessment LUE ROM/Strength/Tone: WFL for tasks assessed Right Lower Extremity Assessment RLE ROM/Strength/Tone: Deficits RLE ROM/Strength/Tone Deficits: diminshed muscle mass of right calf as opposed to left, grossly 3-4/5 RLE Sensation: Deficits RLE Sensation Deficits: foot and ankle are numb from accident, pt says doctors don't kow whats wrong Left Lower Extremity Assessment LLE ROM/Strength/Tone: [facility] for tasks assessed Trunk Assessment Trunk Assessment: Normal   Balance Balance Balance  Assessed: Yes Static Standing Balance Static Standing - Balance Support: Right upper extremity supported Static Standing - Level of Assistance: 4: Min assist Static Standing - Comment/# of Minutes: with RW however (bil. upper extremity support) pt mingaurdA-supervision  End of Session PT - End of Session Equipment Utilized During Treatment: Gait belt Activity Tolerance: Patient tolerated treatment well Patient left: in bed;with call bell/phone within reach (sitting EOB) Nurse Communication: Mobility status  GP     [facility] HELEN 07/17/2012, 12:42 PM

## 2012-07-18 ENCOUNTER — Inpatient Hospital Stay (HOSPITAL_COMMUNITY): Payer: Medicare Other

## 2012-07-18 DIAGNOSIS — E875 Hyperkalemia: Secondary | ICD-10-CM | POA: Diagnosis not present

## 2012-07-18 LAB — GLUCOSE, CAPILLARY
Glucose-Capillary: 178 mg/dL — ABNORMAL HIGH (ref 70–99)
Glucose-Capillary: 211 mg/dL — ABNORMAL HIGH (ref 70–99)

## 2012-07-18 LAB — PROTIME-INR
INR: 2.63 — ABNORMAL HIGH (ref 0.00–1.49)
Prothrombin Time: 26.8 seconds — ABNORMAL HIGH (ref 11.6–15.2)

## 2012-07-18 LAB — BASIC METABOLIC PANEL
GFR calc Af Amer: 36 mL/min — ABNORMAL LOW (ref >90)
GFR calc non Af Amer: 31 mL/min — ABNORMAL LOW (ref >90)
Glucose, Bld: 164 mg/dL — ABNORMAL HIGH (ref 70–99)
Potassium: 5.5 mEq/L — ABNORMAL HIGH (ref 3.5–5.1)
Sodium: 133 mEq/L — ABNORMAL LOW (ref 135–145)

## 2012-07-18 LAB — CK: Total CK: 19 U/L (ref 7–177)

## 2012-07-18 LAB — LACTIC ACID, PLASMA: Lactic Acid, Venous: 1 mmol/L (ref 0.5–2.2)

## 2012-07-18 LAB — OCCULT BLOOD X 1 CARD TO LAB, STOOL: Fecal Occult Bld: POSITIVE

## 2012-07-18 MED ORDER — WARFARIN SODIUM 2.5 MG PO TABS
2.5000 mg | ORAL_TABLET | ORAL | Status: AC
  Administered 2012-07-19: 2.5 mg via ORAL
  Filled 2012-07-18: qty 1

## 2012-07-18 MED ORDER — WARFARIN SODIUM 5 MG PO TABS
5.0000 mg | ORAL_TABLET | ORAL | Status: AC
  Administered 2012-07-18: 5 mg via ORAL
  Filled 2012-07-18: qty 1

## 2012-07-18 NOTE — Progress Notes (Signed)
CARDIAC REHAB PHASE I   PRE:  Rate/Rhythm: 82 SR  BP:  Supine:   Sitting: 88/50  Standing:    SaO2: 92 RA  MODE:  Ambulation: 160 ft   POST:  Rate/Rhythem: 89  BP:  Supine:   Sitting: 90/50  Standing:    SaO2: 97 RA 1410-1445 On arrival pt in bed, able to convince pt to try to walk. Assisted X 2 and used rollater to ambulate. Gait steady with rollater. She was able to walk 160 feet with one sitting rest stop and one standing. Pt back to bed after walk with call light in reach. She felt dizzy during walk and that was when she needed to sit.  Beatrix Fetters

## 2012-07-18 NOTE — Progress Notes (Signed)
Subjective:  Still "weak" SOB off and on. Her Rt foot feels "better"  Objective:  Vital Signs in the last 24 hours: Temp:  [97.4 F (36.3 C)-98.6 F (37 C)] 97.4 F (36.3 C) (11/25 0946) Pulse Rate:  [85-90] 86  (11/25 0946) Resp:  [14-19] 14  (11/25 0946) BP: (100-109)/(52-55) 100/54 mmHg (11/25 0946) SpO2:  [95 %-97 %] 97 % (11/25 0946) Weight:  [71.4 kg (157 lb 6.5 oz)] 71.4 kg (157 lb 6.5 oz) (11/25 0456)  Intake/Output from previous day:  Intake/Output Summary (Last 24 hours) at 07/18/12 1104 Last data filed at 07/18/12 1004  Gross per 24 hour  Intake    723 ml  Output   2350 ml  Net  -1627 ml    Physical Exam: General appearance: alert, cooperative and no distress Lungs: bi basilar rales Heart: regular rate and rhythm Extremities: Rt LE purple discoloration of her Rt foot. Pulse absent.    Rate: 86  Rhythm: normal sinus rhythm  Lab Results: No results found for this basename: WBC:2,HGB:2,PLT:2 in the last 72 hours  Basename 07/18/12 0500 07/17/12 0738  NA 133* 135  K 5.5* 5.1  CL 95* 97  CO2 30 30  GLUCOSE 164* 142*  BUN 43* 40*  CREATININE 1.66* 1.73*   No results found for this basename: TROPONINI:2,CK,MB:2 in the last 72 hours Hepatic Function Panel No results found for this basename: PROT,ALBUMIN,AST,ALT,ALKPHOS,BILITOT,BILIDIR,IBILI in the last 72 hours No results found for this basename: CHOL in the last 72 hours  Basename 07/18/12 0500  INR 2.63*    Imaging: Imaging results have been reviewed  Cardiac Studies:  Assessment/Plan:   Principal Problem:  *Acute systolic CHF, just discharged 98/11/91, her BNP is still 4495  Active Problems:  CAD, 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 She needs CABG but wasn't felt to be a candidate at this time secondary to co-morbidities.  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 I'm not sure why her K+ is elevated, not on K+ supplement of ACE/ARB, will change to low K+ diet. MD to review- ? Does she need PV angiogram to evaluate RLE before discharge. Continue IV Lasix for now, watch renal closely, she may not be able to get PVA based on her SCr. Consider getting renal involved, she did have proteinuria on admission and is a diabetic.    Corine Shelter PA-C 07/18/2012, 11:04 AM

## 2012-07-18 NOTE — Progress Notes (Signed)
I cosign for Lindajo Royal RN assessment, med administration, I&O'S, and notes.

## 2012-07-18 NOTE — Progress Notes (Addendum)
Pt. Seen and examined. Agree with the NP/PA-C note as written.  Reporting a 4/10 right foot pain and "numbness of the toes to midfoot".  Hyperkalemic today, ?early tissue ischemia.  Check CK and venous lactic acid level. Will discuss with Dr. Allyson Sabal to see if he wishes to pursue PV angiogram tomorrow.  Creatinine jumped but is improving. Agree that renal involvement would be helpful.  Chrystie Nose, MD, [facility] Attending Cardiologist [facility]

## 2012-07-18 NOTE — Progress Notes (Signed)
ANTICOAGULATION CONSULT NOTE - Follow Up Consult  Pharmacy Consult for Coumadin Indication: hx DVT  Allergies  Allergen Reactions  . Penicillins Anaphylaxis  . Sulfa Antibiotics Anaphylaxis   Vital Signs: Temp: 97.4 F (36.3 C) (11/25 0946) Temp src: Oral (11/25 0946) BP: 100/54 mmHg (11/25 0946) Pulse Rate: 86  (11/25 0946)  Labs:  Basename 07/18/12 0500 07/17/12 0738 07/16/12 0514  HGB -- -- --  HCT -- -- --  PLT -- -- --  APTT -- -- --  LABPROT 26.8* 31.1* 32.0*  INR 2.63* 3.21* 3.34*  HEPARINUNFRC -- -- --  CREATININE 1.66* 1.73* 1.37*  CKTOTAL -- -- --  CKMB -- -- --  TROPONINI -- -- --    Estimated Creatinine Clearance: 34.2 ml/min (by C-G formula based on Cr of 1.66).  Assessment: 65yof continues on Coumadin for hx DVT of lower extremities (06/22/12) with R popliteal artery thrombus (06/29/12). Home dose is 5 mg daily.  INR therapeutic this morning at 2.63 after lower doses given the past 2 days.  No interactions seen. No bleeding noted. No CBC today. Will likely need to adjust weekly dose, consider 5 mg daily except 2.5 mg Sun/Tues.  Goal of Therapy:  INR 2-3 Monitor platelets by anticoagulation protocol: Yes   Plan:  1) Coumadin 5 mg daily x 2.5 Sun/Tues  2) Follow up INR in AM Herby Abraham, Pharm.D. [phone] 07/18/2012 10:09 AM

## 2012-07-18 NOTE — Progress Notes (Signed)
PT Cancellation Note  Patient Details Name: [name] [name] MRN: [id number] DOB: [birth year]   Cancelled Treatment:    Reason Eval/Treat Not Completed: Attempted to see pt this afternoon but pt with cardiac rehab. Will check back tomorrow morning.    [facility] HELEN 07/18/2012, 3:53 PM

## 2012-07-18 NOTE — Progress Notes (Signed)
PT Cancellation Note  Patient Details Name: RAMAYA GUILE MRN: [id number] DOB: [birth year]   Cancelled Treatment:    Reason Eval/Treat Not Completed: Pt feeling "woozy" this morning and wishes to wait for therapy until she sees the doctor. She is concerned about medicine causing her blood to be low. Will f/u today as schedule allows.    [facility] HELEN 07/18/2012, 8:35 AM Pager: [phone]

## 2012-07-19 LAB — GLUCOSE, CAPILLARY
Glucose-Capillary: 179 mg/dL — ABNORMAL HIGH (ref 70–99)
Glucose-Capillary: 179 mg/dL — ABNORMAL HIGH (ref 70–99)
Glucose-Capillary: 134 mg/dL — ABNORMAL HIGH (ref 70–99)
Glucose-Capillary: 195 mg/dL — ABNORMAL HIGH (ref 70–99)

## 2012-07-19 LAB — BASIC METABOLIC PANEL
CO2: 28 mEq/L (ref 19–32)
Calcium: 11.9 mg/dL — ABNORMAL HIGH (ref 8.4–10.5)
Chloride: 96 mEq/L (ref 96–112)
Creatinine, Ser: 1.49 mg/dL — ABNORMAL HIGH (ref 0.50–1.10)
Glucose, Bld: 183 mg/dL — ABNORMAL HIGH (ref 70–99)

## 2012-07-19 MED ORDER — SODIUM CHLORIDE 0.9 % IV BOLUS (SEPSIS)
250.0000 mL | Freq: Once | INTRAVENOUS | Status: AC
  Administered 2012-07-19: 250 mL via INTRAVENOUS

## 2012-07-19 MED ORDER — PANTOPRAZOLE SODIUM 40 MG PO TBEC
40.0000 mg | DELAYED_RELEASE_TABLET | Freq: Two times a day (BID) | ORAL | Status: DC
  Administered 2012-07-19 – 2012-07-25 (×12): 40 mg via ORAL
  Filled 2012-07-19 (×12): qty 1

## 2012-07-19 MED ORDER — ISOSORBIDE MONONITRATE ER 30 MG PO TB24
30.0000 mg | ORAL_TABLET | Freq: Every day | ORAL | Status: DC
  Administered 2012-07-20 – 2012-07-25 (×6): 30 mg via ORAL
  Filled 2012-07-19 (×6): qty 1

## 2012-07-19 NOTE — Care Management Note (Signed)
    Page 1 of 2   07/19/2012     4:38:37 PM   CARE MANAGEMENT NOTE 07/19/2012  Patient:  [name] [name], [name] [name]   Account Number:  [id number]  Date Initiated:  07/13/2012  Documentation initiated by:  Darlyne Russian  Subjective/Objective Assessment:   Patient admitted with c/o dizzy and shortness of breath     Action/Plan:   Progression of care and discharge planning   Anticipated DC Date:     Anticipated DC Plan:        DC Planning Services  CM consult  Patient refused services      Choice offered to / List presented to:             Status of service:  Completed, signed off Medicare Important Message given?   (If response is "NO", the following Medicare IM given date fields will be blank) Date Medicare IM given:   Date Additional Medicare IM given:    Discharge Disposition:  HOME/SELF CARE  Per UR Regulation:  Reviewed for med. necessity/level of care/duration of stay  If discussed at Long Length of Stay Meetings, dates discussed:    Comments:  07-19-12 1631 Tomi Bamberger, [place] [phone] CM did speak to pt and the plan is for home with friend when stable. Refusing HHRN at this time. CM did give pt the [facility] Number to call for PCP. She can f/u at PCP office for [facility] services. CM did discuss  po diabetic medications with pt that can be purchased at [employer] [facility] [employer]. No further needs from CM at this time.    07/13/2012  1130 Darlyne Russian RN, [place]  [phone] CM referral: assistance with medication costs and need PCP  Met with patient to discuss discharge planning regarding medical follow up and medications. Patient does not have Medicare part B or D The patient had the discharge instructions  from 07/09/2012. She is to follow up with Dr Morton Peters [phone] and Dr Diona Fanti [phone].  She has not scheduled an appointment. She was not able to afford the medications at the pharmacy. She noted the insulin was the most expensive, the other  medications are generic.  CM provided information from Needymeds.org and Lantus web site for assistance. Asked about a glucometer and strips to monitor blood sugars. She is able to afford the meter and strips. Reminded the patient to follow up with the MD office.

## 2012-07-19 NOTE — Progress Notes (Signed)
Pt refused to do orthostatic vitals at this time due to her feeling like she is going to throw up.  Pt got Zofran at 0820, therefore not time for another dose.  Will continue to monitor pt.

## 2012-07-19 NOTE — Progress Notes (Signed)
Patient's orthostatic vital signs were positive.  BP 96/52 while lying and 76/45 when sitting.  Patient states that she is dizzy upon moving.  PA notified.  New orders for a 250 cc bolus of NS given.  Will carry out orders and continue to monitor the patient.

## 2012-07-19 NOTE — Progress Notes (Signed)
CSW met with patient to discuss possible SNF placement. Patient was adamant that she does not want to go to a facility. PT recommended HH.  CSW will inform CM of her decision. Clinical Social Worker will sign off for now as social work intervention is no longer needed. Please consult [place] again if new need arises.   Sabino Niemann, MSW, [employer] [phone]

## 2012-07-19 NOTE — Consult Note (Signed)
[facility] Renal Consultation Note  Requesting MD: Dr. Tresa Endo Indication for Consultation: stage 3 CKD in a patient who may need a contrast dye intervention  HPI: [name] [name] is a 66 y.o. female with past medical history significant for not going to a doctor for over 30 years and having poorly controlled diabetes mellitus.  She presented on October 29 with hypoxia, and lower extremity edema with possible cellulitis.  She was eventually diagnosed a DVT and cardiomyopathy with an ejection fraction of 25% and then underwent cardiac catheterization on Nov 12 which showed multivessel disease with possible good viability. Therefore, she is being prepared for CABG. Also of note, she was diagnosed with a DVT and is now on Coumadin. She underwent a contrast CT scan he upon her initial presentation. She had a cardiac catheterization as mentioned on November 12. The other issues of right foot discomfort, by duplex  had a popliteal artery thrombosis and they're concerned about ischemic right foot and she may need an arteriogram as well. Regarding her renal function, creatinine was initially normal. She's undergone at least 2 insults during these hospitalizations and now appears to be in a slight recovery phase with creatinine being 1.72 days ago and 1.49 today. We are asked she just be on board for impending procedures that seem to be indicated for [name] [name].  Her urine output seems adequate had 2 L out yesterday, urinalysis is bland.  There has been no renal imaging not that any of has been needed.  She's not currently on any nephrotoxic medications. Blood pressure has been borderline in the 90s to low 100s which may be contributing some. Patient currently looks good. She states that she feels much better than when she came in. She indicates that she's 30 pounds lighter she also states that they may wait to do heart surgery. She also seems to be of the opinion that her right foot is improving so  she does not think any invasive intervention will be required.  Creatinine, Ser  Date/Time Value Range Status  07/19/2012  5:30 AM 1.49* 0.50 - 1.10 mg/dL Final  21/30/8657  8:46 AM 1.66* 0.50 - 1.10 mg/dL Final  96/29/5284  1:32 AM 1.73* 0.50 - 1.10 mg/dL Final  44/08/270  5:36 AM 1.37* 0.50 - 1.10 mg/dL Final  64/40/3474  2:59 AM 1.22* 0.50 - 1.10 mg/dL Final  56/38/7564  3:32 AM 1.24* 0.50 - 1.10 mg/dL Final  95/18/8416  6:06 AM 1.12* 0.50 - 1.10 mg/dL Final  30/16/0109  3:23 PM 1.17* 0.50 - 1.10 mg/dL Final  55/73/2202  5:42 AM 1.16* 0.50 - 1.10 mg/dL Final  70/62/3762  8:31 AM 1.18* 0.50 - 1.10 mg/dL Final  51/76/1607  3:71 AM 1.19* 0.50 - 1.10 mg/dL Final  01/24/6947  5:46 AM 1.34* 0.50 - 1.10 mg/dL Final  27/0/3500  9:38 AM 1.38* 0.50 - 1.10 mg/dL Final  18/09/9935  1:69 AM 1.51* 0.50 - 1.10 mg/dL Final  67/03/9380  0:17 AM 1.42* 0.50 - 1.10 mg/dL Final  51/0/2585  2:77 AM 1.43* 0.50 - 1.10 mg/dL Final  82/11/2351  6:14 AM 1.43* 0.50 - 1.10 mg/dL Final  43/08/5398  8:67 AM 1.57* 0.50 - 1.10 mg/dL Final  61/04/5092  2:67 AM 1.55* 0.50 - 1.10 mg/dL Final  12/45/8099  8:33 AM 1.07  0.50 - 1.10 mg/dL Final  82/50/5397  6:73 PM 0.90  0.50 - 1.10 mg/dL Final  11/22/9377 02:40 AM 0.9  0.4 - 1.2 mg/dL Final  9/73/5329 92:42 AM 1.00  Final     PMHx:   Past Medical History  Diagnosis Date  . 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 Diabetes mellitus type 2, uncontrolled, with complications     Very poorly controlled  . Coronary artery disease     Severe Multivessel Disase  . DVT, bilateral lower limbs 05/2012  . 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 Vaginal hysterectomy ~ 1980  . Cesarean section 1979  . Lumbar disc surgery 1980's  . Facial reconstruction surgery   . Cardiac catheterization     Family Hx:  Family History  Problem Relation Age of Onset  . Cancer Mother   . CAD Mother   . Cancer Father   .  CAD Father     Social History:  reports that she has quit smoking. Her smoking use included Cigarettes. She has a 10 pack-year smoking history. She has never used smokeless tobacco. She reports that she does not drink alcohol or use illicit drugs.  Allergies:  Allergies  Allergen Reactions  . Penicillins Anaphylaxis  . Sulfa Antibiotics Anaphylaxis    Medications: Prior to Admission medications   Medication Sig Start Date End Date Taking? Authorizing Provider  acetaminophen (TYLENOL) 325 MG tablet Take 2 tablets (650 mg total) by mouth every 4 (four) hours as needed. 07/09/12  Yes Abelino Derrick, PA  aspirin EC 81 MG EC tablet Take 1 tablet (81 mg total) by mouth daily. 07/09/12  Yes Abelino Derrick, PA  atorvastatin (LIPITOR) 10 MG tablet Take 1 tablet (10 mg total) by mouth daily at 6 PM. 07/09/12  Yes Abelino Derrick, PA  enoxaparin (LOVENOX) 150 MG/ML injection Inject 0.49 mLs (75 mg total) into the skin every 12 (twelve) hours. 07/09/12  Yes Abelino Derrick, PA  feeding supplement (ENSURE COMPLETE) LIQD Take 237 mLs by mouth 2 (two) times daily between meals. 07/09/12  Yes Abelino Derrick, PA  furosemide (LASIX) 40 MG tablet Take 1 tablet (40 mg total) by mouth daily. 07/09/12  Yes Abelino Derrick, PA  insulin glargine (LANTUS) 100 UNIT/ML injection Inject 20 Units into the skin at bedtime. 07/09/12  Yes Abelino Derrick, PA  LORazepam (ATIVAN) 0.5 MG tablet Take 0.5 tablets (0.25 mg total) by mouth every 12 (twelve) hours as needed for anxiety. 07/09/12  Yes Abelino Derrick, PA  metFORMIN (GLUCOPHAGE) 500 MG tablet Take 1 tablet (500 mg total) by mouth 2 (two) times daily with a meal. 07/09/12  Yes Abelino Derrick, PA  metoprolol succinate (TOPROL-XL) 25 MG 24 hr tablet Take 1 tablet (25 mg total) by mouth daily. 07/09/12  Yes Abelino Derrick, PA  Multiple Vitamin (MULTIVITAMIN) LIQD Take 5 mLs by mouth daily.   Yes Historical Provider, MD  pantoprazole (PROTONIX) 40 MG tablet Take 1 tablet (40 mg total)  by mouth daily at 6 (six) AM. 07/09/12  Yes Abelino Derrick, PA  spironolactone (ALDACTONE) 12.5 mg TABS Take 0.5 tablets (12.5 mg total) by mouth daily. 07/09/12  Yes Abelino Derrick, PA  warfarin (COUMADIN) 5 MG tablet Take 1 tablet (5 mg total) by mouth daily. 07/09/12 07/09/16 Yes Abelino Derrick, PA    I have reviewed the patient's current medications.  Labs:  Results for orders placed during the hospital encounter of 07/11/12 (from the past 48 hour(s))  GLUCOSE, CAPILLARY     Status: Abnormal   Collection Time   07/17/12  4:33 PM      Component Value Range Comment   Glucose-Capillary 172 (*) 70 - 99 mg/dL    Comment 1 Documented in Chart      Comment 2 Notify RN     GLUCOSE, CAPILLARY     Status: Abnormal   Collection Time   07/17/12  9:00 PM      Component Value Range Comment   Glucose-Capillary 189 (*) 70 - 99 mg/dL   PROTIME-INR     Status: Abnormal   Collection Time   07/18/12  5:00 AM      Component Value Range Comment   Prothrombin Time 26.8 (*) 11.6 - 15.2 seconds    INR 2.63 (*) 0.00 - 1.49   BASIC METABOLIC PANEL     Status: Abnormal   Collection Time   07/18/12  5:00 AM      Component Value Range Comment   Sodium 133 (*) 135 - 145 mEq/L    Potassium 5.5 (*) 3.5 - 5.1 mEq/L    Chloride 95 (*) 96 - 112 mEq/L    CO2 30  19 - 32 mEq/L    Glucose, Bld 164 (*) 70 - 99 mg/dL    BUN 43 (*) 6 - 23 mg/dL    Creatinine, Ser 1.61 (*) 0.50 - 1.10 mg/dL    Calcium 09.6 (*) 8.4 - 10.5 mg/dL    GFR calc non Af Amer 31 (*) >90 mL/min    GFR calc Af Amer 36 (*) >90 mL/min   GLUCOSE, CAPILLARY     Status: Abnormal   Collection Time   07/18/12  6:22 AM      Component Value Range Comment   Glucose-Capillary 185 (*) 70 - 99 mg/dL    Comment 1 Notify RN     GLUCOSE, CAPILLARY     Status: Abnormal   Collection Time   07/18/12 11:07 AM      Component Value Range Comment   Glucose-Capillary 178 (*) 70 - 99 mg/dL   CK     Status: Normal   Collection Time   07/18/12  2:28 PM       Component Value Range Comment   Total CK 19  7 - 177 U/L   LACTIC ACID, PLASMA     Status: Normal   Collection Time   07/18/12  2:28 PM      Component Value Range Comment   Lactic Acid, Venous 1.0  0.5 - 2.2 mmol/L   GLUCOSE, CAPILLARY     Status: Abnormal   Collection Time   07/18/12  4:10 PM      Component Value Range Comment   Glucose-Capillary 211 (*) 70 - 99 mg/dL    Comment 1 Notify RN      Comment 2 Documented in Chart     OCCULT BLOOD X 1 CARD TO LAB, STOOL     Status: Normal   Collection Time   07/18/12  8:45 PM      Component Value Range Comment   Fecal Occult Bld POSITIVE     GLUCOSE, CAPILLARY     Status: Abnormal   Collection Time   07/18/12  9:13 PM      Component Value Range Comment   Glucose-Capillary 179 (*) 70 - 99 mg/dL    Comment 1 Notify RN      Comment 2 Documented in Chart     PROTIME-INR     Status: Abnormal   Collection Time   07/19/12  5:30 AM  Component Value Range Comment   Prothrombin Time 25.6 (*) 11.6 - 15.2 seconds    INR 2.47 (*) 0.00 - 1.49   BASIC METABOLIC PANEL     Status: Abnormal   Collection Time   07/19/12  5:30 AM      Component Value Range Comment   Sodium 132 (*) 135 - 145 mEq/L    Potassium 5.3 (*) 3.5 - 5.1 mEq/L    Chloride 96  96 - 112 mEq/L    CO2 28  19 - 32 mEq/L    Glucose, Bld 183 (*) 70 - 99 mg/dL    BUN 44 (*) 6 - 23 mg/dL    Creatinine, Ser 2.95 (*) 0.50 - 1.10 mg/dL    Calcium 62.1 (*) 8.4 - 10.5 mg/dL    GFR calc non Af Amer 36 (*) >90 mL/min    GFR calc Af Amer 41 (*) >90 mL/min   GLUCOSE, CAPILLARY     Status: Abnormal   Collection Time   07/19/12  6:16 AM      Component Value Range Comment   Glucose-Capillary 179 (*) 70 - 99 mg/dL   GLUCOSE, CAPILLARY     Status: Abnormal   Collection Time   07/19/12 11:06 AM      Component Value Range Comment   Glucose-Capillary 134 (*) 70 - 99 mg/dL      ROS: Patient and she states she feels much better since being in the hospital. She previously had  significant lower extremity edema which is now improved. She reports that her left foot had the same issues as the right, the left foot has improved and the right is improving according to the patient. She currently denies any shortness of breath or chest discomfort. She complains of polyuria since being on the Lasix. She denies hematuria dysuria or excessive foaminess to the urine. She denies abdominal pain, nausea, vomiting, diarrhea, constipation. She denies hematemesis, melena, hematochezia or bright blood corrected. She denies headaches. She is having some standing dizziness.     Physical Exam: Filed Vitals:   07/19/12 0517  BP: 97/53  Pulse: 86  Temp: 98.8 F (37.1 C)  Resp: 18     General: Woman who appears her stated age. She is currently sitting up in the bed. She is not in any acute distress. HEENT: Pupils are equal round reactive to light  motions are intact, mucous membranes are moist. Neck: There is no jugular venous distention, carotid bruits or lymphadenopathy. Heart: Distant heart sounds, no obvious murmur.  Lungs: Mostly clear to auscultation bilaterally. There are no wheezes.  Abdomen: Soft, nondistended and nontender. There are positive bowel sounds.  Extremities: There is no lower shortly edema. The right foot has a demarcated area of erythema. Patient indicates that that area is getting smaller.  Skin: Warm and dry  Neuro: Patient is alert. She has a slightly strange affect.  Assessment/Plan: 66 Year old with years of poorly controlled diabetes mellitus and now found to have ischemic cardiomyopathy, a popliteal artery clot and possible ischemic right foot. She has developed slightly worsening renal function while in the hospital in the setting of contrast dye studies and persistent hypotension.  1.Renal- patient has a bland urinalysis and her renal function was noted to worsen while she has been in house in the setting of 2 contrast studies as well as persistent  borderline blood pressure. I believe that this is the reason for her elevation in creatinine. Right now she seems to be in a  recovery phase with creatinine improving in urine output being great.  The only thing I think we can improve upon his if we could increase her renal perfusion somewhat by her allowing blood pressure to be a little bit higher than in the 90s. With this in mind actually discontinued her Lasix because she does not appear to need it at this time. I changed the parameter on the hydralazine to try to keep her blood pressure around 110 if we can achieve that. I think it would be wonderful if we could let the dust settle a little before she underwent some major invasive procedure such as a CABG for an extremity bypass. However, I understand that she needs those things she needs them.  We would just need to support as able.  2. Hypertension/volume  - as above she's hypotensive and I suspect she may be a little bit dry. I will check orthostatic vital signs. I will discontinue her Lasix but not give her any fluid right now.  3. Hyperkalemia- probably just do to her CKD. It does appear to be improving.  No action is needed at this time. 4. Anemia  - i dont see that a  CBC has been checked lately, was 9.0 on 11/21. I will check one in the morning. GIB could cause some hyperkalemia as well  Thank you for this consultation. We will continue to follow with you   Verbie Babic A 07/19/2012, 11:50 AM

## 2012-07-19 NOTE — Progress Notes (Signed)
Nutrition Follow-up  Intervention:   1. Continue Ensure Complete as ordered, may need to decrease frequency if pt K+ continues to rise.   Assessment:   Only complaints are that the "food is terrible". Continues to drink Ensure.  Continues to diurese, down 6 L fluids.   Diet Order:  Carb Mod Medium PO intake: variable, mostly >75%  Meds: Scheduled Meds:   . aspirin EC  81 mg Oral Daily  . atorvastatin  20 mg Oral q1800  . carvedilol  6.25 mg Oral BID WC  . docusate sodium  100 mg Oral BID  . feeding supplement  237 mL Oral BID BM  . furosemide  40 mg Intravenous Daily  . hydrALAZINE  25 mg Oral Q8H  . insulin aspart  0-9 Units Subcutaneous TID WC  . insulin aspart protamine-insulin aspart  12 Units Subcutaneous BID WC  . isosorbide mononitrate  30 mg Oral Daily  . pantoprazole  40 mg Oral BID AC  . polyethylene glycol  17 g Oral BID  . ranolazine  500 mg Oral BID  . sodium chloride  3 mL Intravenous Q12H  . warfarin  2.5 mg Oral Custom  . [COMPLETED] warfarin  5 mg Oral Custom  . Warfarin - Pharmacist Dosing Inpatient   Does not apply q1800  . [DISCONTINUED] pantoprazole  40 mg Oral Q0600   Continuous Infusions:  PRN Meds:.acetaminophen, acetaminophen, alum & mag hydroxide-simeth, LORazepam, ondansetron (ZOFRAN) IV, ondansetron, traMADol, zolpidem   CMP     Component Value Date/Time   NA 132* 07/19/2012 0530   K 5.3* 07/19/2012 0530   CL 96 07/19/2012 0530   CO2 28 07/19/2012 0530   GLUCOSE 183* 07/19/2012 0530   BUN 44* 07/19/2012 0530   CREATININE 1.49* 07/19/2012 0530   CALCIUM 11.9* 07/19/2012 0530   CALCIUM 11.1* 07/03/2012 0513   PROT 6.9 07/12/2012 0139   ALBUMIN 2.6* 07/12/2012 0139   AST 14 07/12/2012 0139   ALT 16 07/12/2012 0139   ALKPHOS 199* 07/12/2012 0139   BILITOT 0.4 07/12/2012 0139   GFRNONAA 36* 07/19/2012 0530   GFRAA 41* 07/19/2012 0530    CBG (last 3)   Basename 07/19/12 0616 07/18/12 2113 07/18/12 1610  GLUCAP 179* 179* 211*      Intake/Output Summary (Last 24 hours) at 07/19/12 0944 Last data filed at 07/19/12 0926  Gross per 24 hour  Intake    801 ml  Output   2475 ml  Net  -1674 ml    Weight Status:  156 lbs, trending down slowly.   Re-estimated needs:  1600-1800 kcal, 50-60 gm protein   Nutrition Dx:  Inadequate oral intake r/t poor appetite AEB continued Ensure supplements   Goal:  PO intake to meet >/=90% estimated nutrition needs  Monitor:  PO intake, weight, labs   Clarene Duke RD, LDN Pager [phone] After Hours pager [phone]

## 2012-07-19 NOTE — Progress Notes (Signed)
Subjective:  No SOB. She denies any increasing discomfort Rt foot.  Objective:  Vital Signs in the last 24 hours: Temp:  [97.4 F (36.3 C)-98.9 F (37.2 C)] 98.8 F (37.1 C) (11/26 0517) Pulse Rate:  [86-90] 86  (11/26 0517) Resp:  [14-18] 18  (11/26 0517) BP: (97-100)/(53-56) 97/53 mmHg (11/26 0517) SpO2:  [94 %-97 %] 95 % (11/26 0517) Weight:  [70.897 kg (156 lb 4.8 oz)] 70.897 kg (156 lb 4.8 oz) (11/26 0517)  Intake/Output from previous day:  Intake/Output Summary (Last 24 hours) at 07/19/12 0845 Last data filed at 07/19/12 0814  Gross per 24 hour  Intake    801 ml  Output   2025 ml  Net  -1224 ml    Physical Exam: General appearance: alert, cooperative, no distress and pale Lungs: crackles Lt base Heart: regular rate and rhythm Rt foot discollored but not painful to touch, not hot   Rate: 84  Rhythm: normal sinus rhythm  Lab Results: No results found for this basename: WBC:2,HGB:2,PLT:2 in the last 72 hours  Basename 07/19/12 0530 07/18/12 0500  NA 132* 133*  K 5.3* 5.5*  CL 96 95*  CO2 28 30  GLUCOSE 183* 164*  BUN 44* 43*  CREATININE 1.49* 1.66*   No results found for this basename: TROPONINI:2,CK,MB:2 in the last 72 hours Hepatic Function Panel No results found for this basename: PROT,ALBUMIN,AST,ALT,ALKPHOS,BILITOT,BILIDIR,IBILI in the last 72 hours No results found for this basename: CHOL in the last 72 hours  Basename 07/19/12 0530  INR 2.47*    Imaging: Imaging results have been reviewed  Cardiac Studies:  Assessment/Plan:   Principal Problem:  *Acute systolic CHF, just discharged 29/56/21 Active Problems:  CAD, 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 ischemic Rt foot  Chronic anticoagulation,  discharged on Coumadin 07/09/12  Hyperkalemia, not on ACE or ARB.  CK and Lactic acid WNL  LBBB (left bundle branch block)  DM, Type 2 IDDM  Anemia, heme positive   Plan- I called renal yesterday. She had positive stool guaiac yesterday but denies melana and her Hgb has been stable, probable hemorrhoidal bleeding. Will discuss with Dr Allyson Sabal, her INR is therapeutic so PVA could not be done anytime in the next few days. She is still diuresing on IV Lasix. Check BNP and BMP in am. Follow Hgb.   [name] Shelter PA-C 07/19/2012, 8:45 AM    Agree with note written by [name] [name] [facility]  Prob OK for D/C tomorrow or next day. No CP/SOB. Right foot actually looks a little better and isn't painful. She received a fluid bolus because of dizziness and orthostatic BP measurements. Will re check a 2D to see if EF has improved since 10/31 and if not will need a lifeVest.  [name] [name] 07/19/2012 4:09 PM

## 2012-07-19 NOTE — Progress Notes (Signed)
[id number] Cardiac Rehab Attempted to get pt to ambulate. She c/o of constipation and feels scared to leave [facility]. Pt would like rollater to use at home and would recommend one due to pt has need to take sitting rest stops.

## 2012-07-19 NOTE — Progress Notes (Signed)
ANTICOAGULATION CONSULT NOTE - Follow Up Consult  Pharmacy Consult for Coumadin Indication: hx DVT  Allergies  Allergen Reactions  . Penicillins Anaphylaxis  . Sulfa Antibiotics Anaphylaxis   Vital Signs: Temp: 98.8 F (37.1 C) (11/26 0517) Temp src: Oral (11/26 0517) BP: 97/53 mmHg (11/26 0517) Pulse Rate: 86  (11/26 0517)  Labs:  Basename 07/19/12 0530 07/18/12 1428 07/18/12 0500 07/17/12 0738  HGB -- -- -- --  HCT -- -- -- --  PLT -- -- -- --  APTT -- -- -- --  LABPROT 25.6* -- 26.8* 31.1*  INR 2.47* -- 2.63* 3.21*  HEPARINUNFRC -- -- -- --  CREATININE 1.49* -- 1.66* 1.73*  CKTOTAL -- 19 -- --  CKMB -- -- -- --  TROPONINI -- -- -- --    Estimated Creatinine Clearance: 35.2 ml/min (by C-G formula based on Cr of 1.49).  Assessment: 65yof continues on Coumadin for hx DVT of lower extremities (06/22/12) with R popliteal artery thrombus (06/29/12). Home dose is 5 mg daily.  INR therapeutic this morning at 2.47. No interactions seen. + stool guaiac 11/25 PM - ? 2nd hemorrhoids.  No overt bleeding noted. No CBC today.   Goal of Therapy:  INR 2-3 Monitor platelets by anticoagulation protocol: Yes   Plan:  1) Coumadin 5 mg daily x 2.5 Sun/Tues  2) Follow up INR in AM - if OK could change to MWF INRs Herby Abraham, Pharm.D. [phone] 07/19/2012 10:59 AM

## 2012-07-19 NOTE — Progress Notes (Signed)
Physical Therapy Treatment Patient Details Name: [name] [name] MRN: [id number] DOB: 20-Jul-1946 Today's Date: 07/19/2012 Time: 4098-1191 PT Time Calculation (min): 28 min  PT Assessment / Plan / Recommendation Comments on Treatment Session  [name] [name] appears to have more endurance today for activity. Was preoccupied with having a bowel movement initially.     Follow Up Recommendations  Home health PT;Supervision for mobility/OOB     Does the patient have the potential to tolerate intense rehabilitation     Barriers to Discharge        Equipment Recommendations  Rolling walker with 5" wheels    Recommendations for Other Services    Frequency Min 3X/week   Plan Discharge plan remains appropriate;Frequency remains appropriate    Precautions / Restrictions Precautions Precautions: Fall Restrictions Weight Bearing Restrictions: No   Pertinent Vitals/Pain No c/o pain or dizziness today.     Mobility  Bed Mobility Bed Mobility: Supine to Sit;Sitting - Scoot to Edge of Bed Supine to Sit: 4: Min assist Sitting - Scoot to [place] of Bed: 6: Modified independent (Device/Increase time) Details for Bed Mobility Assistance: pt pulling on therapists arm to bring self upright, needing minA for follow through  Transfers Transfers: Sit to Stand;Stand to Sit (x3 for strengthening) Sit to Stand: 5: Supervision;With upper extremity assist;From chair/3-in-1;From bed;With armrests Stand to Sit: 5: Supervision;With armrests;To bed;To chair/3-in-1 Details for Transfer Assistance: cues for safe hand placement, able to stabilize with RW Ambulation/Gait Ambulation/Gait Assistance: 5: Supervision;4: Min guard Ambulation Distance (Feet): 100 Feet Assistive device: Rolling walker Ambulation/Gait Assistance Details: cues for tall posture and safe positioning within the RW, one slight LOB but pt able to correct herself Gait Pattern: Trunk flexed;Shuffle    Exercises General Exercises - Lower  Extremity Long Arc Quad: AROM;Both;10 reps;Seated Toe Raises: AROM;Both;10 reps;Seated Heel Raises: AROM;Both;10 reps;Seated     PT Goals Acute Rehab PT Goals PT Goal: Supine/Side to Sit - Progress: Progressing toward goal PT Goal: Sit to Stand - Progress: Progressing toward goal PT Goal: Stand to Sit - Progress: Progressing toward goal PT Transfer Goal: Bed to Chair/Chair to Bed - Progress: Progressing toward goal PT Goal: Ambulate - Progress: Progressing toward goal  Visit Information  Last PT Received On: 07/19/12 Assistance Needed: +1    Subjective Data  Subjective: "Come on [name] [name], you aren't going to poop on the floor, you aren't going to poop on the floor" (pt cheering herself on during walking)   Cognition  Overall Cognitive Status: Impaired Area of Impairment: Attention Arousal/Alertness: Awake/alert Orientation Level: Appears intact for tasks assessed Behavior During Session: [facility] for tasks performed Current Attention Level: Alternating Attention - Other Comments: needs redirection back to task but able to alternate between exercises and her stories about the weather channel     Balance  Balance Balance Assessed: Yes Static Sitting Balance Static Sitting - Balance Support: No upper extremity supported Static Sitting - Level of Assistance: 7: Independent Static Standing Balance Static Standing - Balance Support: Bilateral upper extremity supported;Right upper extremity supported Static Standing - Level of Assistance: 5: Stand by assistance Static Standing - Comment/# of Minutes: pt bracing herself on the bed to perform pericare  End of Session PT - End of Session Equipment Utilized During Treatment: Gait belt Activity Tolerance: Patient tolerated treatment well Patient left: in chair;with call bell/phone within reach (nurse tech in room, to help her back to bed) Nurse Communication: Mobility status   GP     [facility] HELEN 07/19/2012, 12:07 PM

## 2012-07-20 LAB — CBC
HCT: 29.4 % — ABNORMAL LOW (ref 36.0–46.0)
Hemoglobin: 9.3 g/dL — ABNORMAL LOW (ref 12.0–15.0)
MCH: 25 pg — ABNORMAL LOW (ref 26.0–34.0)
MCHC: 31.6 g/dL (ref 30.0–36.0)
MCV: 79 fL (ref 78.0–100.0)
Platelets: 354 10*3/uL (ref 150–400)
RBC: 3.72 MIL/uL — ABNORMAL LOW (ref 3.87–5.11)
RDW: 16 % — ABNORMAL HIGH (ref 11.5–15.5)
WBC: 9.6 10*3/uL (ref 4.0–10.5)

## 2012-07-20 LAB — GLUCOSE, CAPILLARY: Glucose-Capillary: 144 mg/dL — ABNORMAL HIGH (ref 70–99)

## 2012-07-20 LAB — BASIC METABOLIC PANEL
BUN: 43 mg/dL — ABNORMAL HIGH (ref 6–23)
Calcium: 12.1 mg/dL — ABNORMAL HIGH (ref 8.4–10.5)
Creatinine, Ser: 1.64 mg/dL — ABNORMAL HIGH (ref 0.50–1.10)
GFR calc non Af Amer: 32 mL/min — ABNORMAL LOW (ref >90)
Glucose, Bld: 214 mg/dL — ABNORMAL HIGH (ref 70–99)
Potassium: 4.9 mEq/L (ref 3.5–5.1)

## 2012-07-20 MED ORDER — WARFARIN SODIUM 5 MG PO TABS
5.0000 mg | ORAL_TABLET | Freq: Once | ORAL | Status: DC
  Filled 2012-07-20: qty 1

## 2012-07-20 MED ORDER — WARFARIN SODIUM 5 MG PO TABS
5.0000 mg | ORAL_TABLET | Freq: Every day | ORAL | Status: DC
  Administered 2012-07-20 – 2012-07-24 (×5): 5 mg via ORAL
  Filled 2012-07-20 (×6): qty 1

## 2012-07-20 MED ORDER — LOPERAMIDE HCL 2 MG PO CAPS
2.0000 mg | ORAL_CAPSULE | Freq: Once | ORAL | Status: AC
  Administered 2012-07-20: 2 mg via ORAL
  Filled 2012-07-20: qty 1

## 2012-07-20 NOTE — Progress Notes (Addendum)
PT Cancellation Note  Patient Details Name: [name] [name] MRN: [id number] DOB: [birth year]   Cancelled Treatment:     Pt currently very upset with c/o diarrhea and needing to be cleaned up. Pt also complaining of not getting the right diet and being very hungry. Pt declining PT right now. RN aware of pt concerns. Will f/u as time and schedule allow.   [facility] HELEN 07/20/2012, 9:36 AM Pager: [phone]

## 2012-07-20 NOTE — Progress Notes (Signed)
Patient has refused to be transported for echocardiogram; will continue to monitor.  [place]

## 2012-07-20 NOTE — Progress Notes (Signed)
ANTICOAGULATION CONSULT NOTE - Follow Up Consult  Pharmacy Consult for Coumadin Indication: hx DVT  Allergies  Allergen Reactions  . Penicillins Anaphylaxis  . Sulfa Antibiotics Anaphylaxis   Vital Signs: Temp: 98.2 F (36.8 C) (11/27 0442) Temp src: Oral (11/27 0442) BP: 107/56 mmHg (11/27 0612) Pulse Rate: 84  (11/27 0612)  Labs:  Basename 07/20/12 0520 07/19/12 0530 07/18/12 1428 07/18/12 0500  HGB 9.3* -- -- --  HCT 29.4* -- -- --  PLT 354 -- -- --  APTT -- -- -- --  LABPROT 26.1* 25.6* -- 26.8*  INR 2.54* 2.47* -- 2.63*  HEPARINUNFRC -- -- -- --  CREATININE 1.64* 1.49* -- 1.66*  CKTOTAL -- -- 19 --  CKMB -- -- -- --  TROPONINI -- -- -- --    Estimated Creatinine Clearance: 32 ml/min (by C-G formula based on Cr of 1.64).  Assessment: 65yof continues on Coumadin for hx DVT of lower extremities (06/22/12) with R popliteal artery thrombus (06/29/12). Home dose is 5 mg daily.  INR therapeutic this morning , continue home dose .   Goal of Therapy:  INR 2-3 Monitor platelets by anticoagulation protocol: Yes   Plan:  Coumadin 5mg  daily and f/u protime MWF.  Verlene Mayer, PharmD, BCPS Pager [phone] 07/20/2012 8:59 AM

## 2012-07-20 NOTE — Progress Notes (Signed)
  Echocardiogram 2D Echocardiogram has been performed.  Cathie Beams 07/20/2012, 2:33 PM

## 2012-07-20 NOTE — Progress Notes (Signed)
Subjective:  Had diarrhea "all night long, currently trying to catch up on sleep.  BP still lowish, creatinine went up a little.  UOP seems adequate Objective Vital signs in last 24 hours: Filed Vitals:   07/19/12 2044 07/20/12 0442 07/20/12 0612 07/20/12 0945  BP: 102/62 100/63 107/56 102/58  Pulse: 76 85 84   Temp: 98.1 F (36.7 C) 98.2 F (36.8 C)  98.2 F (36.8 C)  TempSrc: Oral Oral  Oral  Resp: 18 19  18   Height:      Weight:  70.9 kg (156 lb 4.9 oz)    SpO2: 95% 94%  97%   Weight change: 0.003 kg (0.1 oz)  Intake/Output Summary (Last 24 hours) at 07/20/12 1337 Last data filed at 07/20/12 1100  Gross per 24 hour  Intake    583 ml  Output    902 ml  Net   -319 ml   Labs: Basic Metabolic Panel:  Lab 07/20/12 1610 07/19/12 0530 07/18/12 0500  NA 133* 132* 133*  K 4.9 5.3* 5.5*  CL 96 96 95*  CO2 26 28 30   GLUCOSE 214* 183* 164*  BUN 43* 44* 43*  CREATININE 1.64* 1.49* 1.66*  CALCIUM 12.1* 11.9* 11.8*  ALB -- -- --  PHOS -- -- --   Liver Function Tests: No results found for this basename: AST:3,ALT:3,ALKPHOS:3,BILITOT:3,PROT:3,ALBUMIN:3 in the last 168 hours No results found for this basename: LIPASE:3,AMYLASE:3 in the last 168 hours No results found for this basename: AMMONIA:3 in the last 168 hours CBC:  Lab 07/20/12 0520 07/14/12 0520  WBC 9.6 8.4  NEUTROABS -- --  HGB 9.3* 9.0*  HCT 29.4* 28.7*  MCV 79.0 80.6  PLT 354 386   Cardiac Enzymes:  Lab 07/18/12 1428  CKTOTAL 19  CKMB --  CKMBINDEX --  TROPONINI --   CBG:  Lab 07/20/12 1107 07/20/12 0609 07/19/12 2102 07/19/12 1601 07/19/12 1106  GLUCAP 144* 228* 134* 195* 134*    Iron Studies: No results found for this basename: IRON,TIBC,TRANSFERRIN,FERRITIN in the last 72 hours Studies/Results: No results found. Medications: Infusions:    Scheduled Medications:    . aspirin EC  81 mg Oral Daily  . atorvastatin  20 mg Oral q1800  . carvedilol  6.25 mg Oral BID WC  . docusate sodium   100 mg Oral BID  . feeding supplement  237 mL Oral BID BM  . hydrALAZINE  25 mg Oral Q8H  . insulin aspart  0-9 Units Subcutaneous TID WC  . insulin aspart protamine-insulin aspart  12 Units Subcutaneous BID WC  . isosorbide mononitrate  30 mg Oral Daily  . [COMPLETED] loperamide  2 mg Oral Once  . pantoprazole  40 mg Oral BID AC  . polyethylene glycol  17 g Oral BID  . ranolazine  500 mg Oral BID  . [COMPLETED] sodium chloride  250 mL Intravenous Once  . sodium chloride  3 mL Intravenous Q12H  . [COMPLETED] warfarin  2.5 mg Oral Custom  . warfarin  5 mg Oral q1800  . Warfarin - Pharmacist Dosing Inpatient   Does not apply q1800  . [DISCONTINUED] isosorbide mononitrate  30 mg Oral Daily  . [DISCONTINUED] warfarin  5 mg Oral ONCE-1800    have reviewed scheduled and prn medications.  Physical Exam: General: resting, NAD Heart: RRR Lungs: mostly clear Abdomen: soft and nontender Extremities: no edema.  Foot looks a little better ?    Assessment/ Plan: Pt is a 66 y.o. yo female who was  admitted on 07/11/2012 with  Complications of ischemic cardiomypathy and PAD.  We are asked to eval for elevated creatinine.   Assessment/Plan: 1. Now likely CKD-  Fairly bland U/A.  Developed CKD in the hospital in the setting of a couple of contrasted studies as well as chronic hypotension.  Seeming like mostly a cardiorenal type picture. Creatinine lately in the 1.4-1.6 range.  Good UOP. Just trying to improve renal perfusion by decreasing BP  meds.  Again , I think if major interventions could be postponed that would be the best for Ms. Forand for her and her renal function.   2.HTN/volume- seems euvolemic to dry.  Refused orthostatic BPs. I stopped lasix and gave parameters on hydralazine to assist in improving renal perfusion.   3. Anemia- mostly due to hospitalization, also question of GIB with positive stool cards.  Hgb stable from preevious, just monitoring per primary team.   4. Hyperkalemia-  resolved    Anthonia Monger A   07/20/2012,1:37 PM  LOS: 9 days

## 2012-07-20 NOTE — Progress Notes (Signed)
I cosign Rashida Haney, RN's assessment, med administration, notes, I/O, and care plan/education. 

## 2012-07-20 NOTE — Progress Notes (Signed)
[facility]  Subjective: Upset about having diarrhea.  She feels her right foot has improved greatly.  Color is better and the feeling is returning.  Objective: Vital signs in last 24 hours: Temp:  [97.9 F (36.6 C)-98.2 F (36.8 C)] 98.2 F (36.8 C) (11/27 0945) Pulse Rate:  [76-85] 84  (11/27 0612) Resp:  [18-19] 18  (11/27 0945) BP: (76-107)/(45-63) 102/58 mmHg (11/27 0945) SpO2:  [94 %-97 %] 97 % (11/27 0945) Weight:  [70.9 kg (156 lb 4.9 oz)] 70.9 kg (156 lb 4.9 oz) (11/27 0442) Last BM Date: 07/20/12  Intake/Output from previous day: 11/26 0701 - 11/27 0700 In: 463 [P.O.:460; I.V.:3] Out: 1002 [Urine:1000; Stool:2] Intake/Output this shift: Total I/O In: 363 [P.O.:360; I.V.:3] Out: -   Medications Current Facility-Administered Medications  Medication Dose Route Frequency Provider Last Rate Last Dose  . acetaminophen (TYLENOL) tablet 650 mg  650 mg Oral Q6H PRN Eduard Clos, MD   650 mg at 07/16/12 2356   Or  . acetaminophen (TYLENOL) suppository 650 mg  650 mg Rectal Q6H PRN Eduard Clos, MD      . alum & mag hydroxide-simeth (MAALOX/MYLANTA) 200-200-20 MG/5ML suspension 30 mL  30 mL Oral Q6H PRN Elease Etienne, MD   30 mL at 07/20/12 0444  . aspirin EC tablet 81 mg  81 mg Oral Daily Eduard Clos, MD   81 mg at 07/20/12 0948  . atorvastatin (LIPITOR) tablet 20 mg  20 mg Oral q1800 Lennette Bihari, MD   20 mg at 07/19/12 1751  . carvedilol (COREG) tablet 6.25 mg  6.25 mg Oral BID WC Lennette Bihari, MD   6.25 mg at 07/20/12 9528  . docusate sodium (COLACE) capsule 100 mg  100 mg Oral BID Kerry Hough, PA   100 mg at 07/19/12 4132  . feeding supplement (ENSURE COMPLETE) liquid 237 mL  237 mL Oral BID BM Eduard Clos, MD   237 mL at 07/19/12 1400  . hydrALAZINE (APRESOLINE) tablet 25 mg  25 mg Oral Q8H Cecille Aver, MD   25 mg at 07/19/12 2217  . insulin aspart (novoLOG) injection 0-9 Units  0-9 Units  Subcutaneous TID WC Eduard Clos, MD   3 Units at 07/20/12 0631  . insulin aspart protamine-insulin aspart (NOVOLOG 70/30) injection 12 Units  12 Units Subcutaneous BID WC Zannie Cove, MD   12 Units at 07/20/12 0751  . isosorbide mononitrate (IMDUR) 24 hr tablet 30 mg  30 mg Oral Daily Eda Paschal [place], [place]   30 mg at 07/20/12 0948  . LORazepam (ATIVAN) tablet 0.5 mg  0.5 mg Oral Q6H PRN Abelino Derrick, PA   0.5 mg at 07/19/12 1504  . ondansetron (ZOFRAN) tablet 4 mg  4 mg Oral Q6H PRN Eduard Clos, MD   4 mg at 07/19/12 0820   Or  . ondansetron (ZOFRAN) injection 4 mg  4 mg Intravenous Q6H PRN Eduard Clos, MD   4 mg at 07/16/12 2053  . pantoprazole (PROTONIX) EC tablet 40 mg  40 mg Oral BID AC Eda Paschal [place], [place]   40 mg at 07/20/12 4401  . polyethylene glycol (MIRALAX / GLYCOLAX) packet 17 g  17 g Oral BID Zannie Cove, MD   17 g at 07/18/12 2215  . ranolazine (RANEXA) 12 hr tablet 500 mg  500 mg Oral BID Lennette Bihari, MD   500 mg at 07/20/12 0948  . [COMPLETED]  sodium chloride 0.9 % bolus 250 mL  250 mL Intravenous Once [employer], PA   250 mL at 07/19/12 1758  . sodium chloride 0.9 % injection 3 mL  3 mL Intravenous Q12H Eduard Clos, MD   3 mL at 07/20/12 0948  . traMADol (ULTRAM) tablet 50 mg  50 mg Oral Q6H PRN Abelino Derrick, PA   50 mg at 07/16/12 0327  . [COMPLETED] warfarin (COUMADIN) tablet 2.5 mg  2.5 mg Oral Custom Herby Abraham, PHARMD   2.5 mg at 07/19/12 1752  . warfarin (COUMADIN) tablet 5 mg  5 mg Oral q1800 Lennette Bihari, MD      . Warfarin - Pharmacist Dosing Inpatient   Does not apply [id number] Eduard Clos, MD      . zolpidem [facility]) tablet 5 mg  5 mg Oral QHS PRN Abelino Derrick, PA   5 mg at 07/18/12 2354  . [DISCONTINUED] furosemide (LASIX) injection 40 mg  40 mg Intravenous Daily Eduard Clos, MD   40 mg at 07/19/12 0923  . [DISCONTINUED] isosorbide mononitrate (IMDUR) 24 hr tablet 30 mg  30 mg Oral Daily Marykay Lex, MD   30  mg at 07/19/12 6045  . [DISCONTINUED] warfarin (COUMADIN) tablet 5 mg  5 mg Oral ONCE-1800 Lennette Bihari, MD        PE: General appearance: alert, cooperative and no distress Lungs: clear to auscultation bilaterally Heart: regular rate and rhythm, S1, S2 normal, no murmur, click, rub or gallop Abdomen: Nontender.  +BS. Extremities: No LEE Pulses: 2+ and symmetric.  1+ left DP.  1+ right PT.  Feet are warm. Skin: warm and dry.  Small area of ecchymosis in the distal foot/toes.   Neurologic: Grossly normal  Lab Results:   [facility] 07/20/12 0520  WBC 9.6  HGB 9.3*  HCT 29.4*  PLT 354   BMET  Basename 07/20/12 0520 07/19/12 0530 07/18/12 0500  NA 133* 132* 133*  K 4.9 5.3* 5.5*  CL 96 96 95*  CO2 26 28 30   GLUCOSE 214* 183* 164*  BUN 43* 44* 43*  CREATININE 1.64* 1.49* 1.66*  CALCIUM 12.1* 11.9* 11.8*   PT/INR  Basename 07/20/12 0520 07/19/12 0530 07/18/12 0500  LABPROT 26.1* 25.6* 26.8*  INR 2.54* 2.47* 2.63*    Assessment/Plan  Principal Problem:  *Acute systolic CHF, just discharged 40/98/11 Active Problems:  LBBB (left bundle branch block)  DVT Jun 21 2012  DM, Type 2 IDDM   Renal insufficiency, she has been NL at times but now appears to have stage III Chronic RI (moderate)  Ischemic cardiomyopathy, EF 25%  Popliteal artery thrombosis, 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 She needs CABG  Chronic anticoagulation, discharged on Coumadin 07/09/12  PAF, on admission 07/12/12  Anemia, heme positive  Hyperkalemia, not on ACE or ARB.  CK and Lactic acid WNL  Plan:  Foot pain resolved.  Color in foot is improving.  NSR on tele.  No NSVT on tele but she probably needs a Life Vest.  Lasix  DCd per renal to help with BP.  She refused the limited Echo because of diarrhea.  Hopefully this afternoon.  Imodium ordered.     LOS: 9 days    HAGER, BRYAN 07/20/2012 9:49 AM   Patient seen and examined. Agree with assessment  and plan. Echo was done, but still not in system to finalize and review in detail.  However, brief review on floor with echo tech now at 5 pm reveals estimated EF 30 - 35% at best, improved from several weeks ago with medical therapy. R foot coloration improved. Consider life-vest at DC pending CABG.   Lennette Bihari, MD, [facility] 07/20/2012 4:52 PM

## 2012-07-20 NOTE — Progress Notes (Signed)
Patient is anxious and asked for ativan, 0.5mg  of Ativan given to patient; will continue to monitor.  Lorretta Harp RN

## 2012-07-20 NOTE — Progress Notes (Signed)
[id number] Cardiac Rehab Came to ambulate pt. she now c/o of diarrhea and is afraid to leave [facility]. Encouraged pt to walk later today when she felt bowels were under control.

## 2012-07-21 DIAGNOSIS — I959 Hypotension, unspecified: Secondary | ICD-10-CM | POA: Diagnosis not present

## 2012-07-21 LAB — GLUCOSE, CAPILLARY
Glucose-Capillary: 120 mg/dL — ABNORMAL HIGH (ref 70–99)
Glucose-Capillary: 123 mg/dL — ABNORMAL HIGH (ref 70–99)
Glucose-Capillary: 140 mg/dL — ABNORMAL HIGH (ref 70–99)
Glucose-Capillary: 72 mg/dL (ref 70–99)

## 2012-07-21 LAB — BASIC METABOLIC PANEL
BUN: 34 mg/dL — ABNORMAL HIGH (ref 6–23)
Calcium: 11.9 mg/dL — ABNORMAL HIGH (ref 8.4–10.5)
GFR calc non Af Amer: 36 mL/min — ABNORMAL LOW (ref >90)
Glucose, Bld: 125 mg/dL — ABNORMAL HIGH (ref 70–99)
Sodium: 132 mEq/L — ABNORMAL LOW (ref 135–145)

## 2012-07-21 NOTE — Progress Notes (Signed)
Pt awaiting stool sample, pt educated on Na restricted diet, pt needs reinforcement with dietary needs, will monitor

## 2012-07-21 NOTE — Progress Notes (Signed)
Subjective:  Diarrhea improved.  Echo showed slightly improved EF at 30 %.  She claims foot is better.  No new complaints.  Objective Vital signs in last 24 hours: Filed Vitals:   07/20/12 2158 07/20/12 2319 07/21/12 0500 07/21/12 0534  BP: 102/58 96/65  101/64  Pulse: 82 80  59  Temp: 98.2 F (36.8 C)   98.4 F (36.9 C)  TempSrc: Oral   Oral  Resp: 18   18  Height:      Weight:   71.351 kg (157 lb 4.8 oz) 70.8 kg (156 lb 1.4 oz)  SpO2: 96%   77%   Weight change: 0.451 kg (15.9 oz)  Intake/Output Summary (Last 24 hours) at 07/21/12 0927 Last data filed at 07/21/12 0535  Gross per 24 hour  Intake    243 ml  Output    900 ml  Net   -657 ml   Labs: Basic Metabolic Panel:  Lab 07/21/12 4540 07/20/12 0520 07/19/12 0530  NA 132* 133* 132*  K 4.3 4.9 5.3*  CL 98 96 96  CO2 28 26 28   GLUCOSE 125* 214* 183*  BUN 34* 43* 44*  CREATININE 1.49* 1.64* 1.49*  CALCIUM 11.9* 12.1* 11.9*  ALB -- -- --  PHOS -- -- --   Liver Function Tests: No results found for this basename: AST:3,ALT:3,ALKPHOS:3,BILITOT:3,PROT:3,ALBUMIN:3 in the last 168 hours No results found for this basename: LIPASE:3,AMYLASE:3 in the last 168 hours No results found for this basename: AMMONIA:3 in the last 168 hours CBC:  Lab 07/20/12 0520  WBC 9.6  NEUTROABS --  HGB 9.3*  HCT 29.4*  MCV 79.0  PLT 354   Cardiac Enzymes:  Lab 07/18/12 1428  CKTOTAL 19  CKMB --  CKMBINDEX --  TROPONINI --   CBG:  Lab 07/21/12 0844 07/21/12 0540 07/20/12 2105 07/20/12 1628 07/20/12 1107  GLUCAP 123* 120* 81 136* 144*    Iron Studies: No results found for this basename: IRON,TIBC,TRANSFERRIN,FERRITIN in the last 72 hours Studies/Results: No results found. Medications: Infusions:    Scheduled Medications:    . aspirin EC  81 mg Oral Daily  . atorvastatin  20 mg Oral q1800  . carvedilol  6.25 mg Oral BID WC  . docusate sodium  100 mg Oral BID  . feeding supplement  237 mL Oral BID BM  . hydrALAZINE  25  mg Oral Q8H  . insulin aspart  0-9 Units Subcutaneous TID WC  . insulin aspart protamine-insulin aspart  12 Units Subcutaneous BID WC  . isosorbide mononitrate  30 mg Oral Daily  . [COMPLETED] loperamide  2 mg Oral Once  . pantoprazole  40 mg Oral BID AC  . polyethylene glycol  17 g Oral BID  . ranolazine  500 mg Oral BID  . sodium chloride  3 mL Intravenous Q12H  . warfarin  5 mg Oral q1800  . Warfarin - Pharmacist Dosing Inpatient   Does not apply q1800    have reviewed scheduled and prn medications.  Physical Exam: General: resting, NAD Heart: RRR Lungs: mostly clear Abdomen: soft and nontender Extremities: no edema.  Foot looks a little better ?     Assessment/ Plan: Pt is a 66 y.o. yo female who was admitted on 07/11/2012 with  Complications of ischemic cardiomypathy and PAD.  We are asked to eval for elevated creatinine.   Assessment/Plan: 1. Now likely CKD-  Fairly bland U/A.  Developed CKD in the hospital in the setting of a couple of contrasted studies  as well as chronic hypotension.  Seeming like mostly a cardiorenal type picture. Creatinine lately in the 1.4-1.6 range.  Reasonable  UOP. Just trying to improve renal perfusion by decreasing BP  meds.  Again , I think if major interventions could be postponed that would be the best for Ms. Calandro for her and her renal function.  I am going to go ahead and stop the hydralazine as her BP has not been high enough to get it in last 48 hours.  2.HTN/volume- seems euvolemic to dry.  Refused orthostatic BPs. I stopped lasix and hydralazine.    3. Anemia- mostly due to hospitalization, also question of GIB with positive stool cards.  Hgb stable from previous, just monitoring per primary team.   4. Hyperkalemia- resolved  I am not contributing too much in the day to day management of Ms. Satcher.  I believe the plan is for her to go home before CABG.  I would just continue with efforts to improve renal perfusion as able.  Likely at  discharge she will require some diuretic maybe 20 per day of lasix when she resumes her likely sodium filled diet.  I will arrange OP follow up with me in the office.  Renal will sign off, call with questions.    Ripken Rekowski A   07/21/2012,9:27 AM  LOS: 10 days

## 2012-07-21 NOTE — Progress Notes (Signed)
Subjective:  No new complaints today  Objective:  Vital Signs in the last 24 hours: Temp:  [98.2 F (36.8 C)-98.4 F (36.9 C)] 98.4 F (36.9 C) (11/28 0534) Pulse Rate:  [59-87] 59  (11/28 0534) Resp:  [18-20] 18  (11/28 0534) BP: (90-104)/(50-65) 90/50 mmHg (11/28 1010) SpO2:  [77 %-98 %] 77 % (11/28 0534) Weight:  [70.8 kg (156 lb 1.4 oz)-71.351 kg (157 lb 4.8 oz)] 70.8 kg (156 lb 1.4 oz) (11/28 0534)  Intake/Output from previous day:  Intake/Output Summary (Last 24 hours) at 07/21/12 1035 Last data filed at 07/21/12 0535  Gross per 24 hour  Intake    240 ml  Output    900 ml  Net   -660 ml    Physical Exam: General appearance: alert, cooperative and no distress Lungs: crackles Rt base Heart: regular rate and rhythm   Rate: 60  Rhythm: normal sinus rhythm  Lab Results:  Basename 07/20/12 0520  WBC 9.6  HGB 9.3*  PLT 354    Basename 07/21/12 0636 07/20/12 0520  NA 132* 133*  K 4.3 4.9  CL 98 96  CO2 28 26  GLUCOSE 125* 214*  BUN 34* 43*  CREATININE 1.49* 1.64*   No results found for this basename: TROPONINI:2,CK,MB:2 in the last 72 hours Hepatic Function Panel No results found for this basename: PROT,ALBUMIN,AST,ALT,ALKPHOS,BILITOT,BILIDIR,IBILI in the last 72 hours No results found for this basename: CHOL in the last 72 hours  Basename 07/20/12 0520  INR 2.54*    Imaging: Imaging results have been reviewed  Cardiac Studies:  Assessment/Plan:   Principal Problem:  *Acute systolic CHF, just discharged 16/10/96 Active Problems:  CAD, 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 ischemic Rt foot  Chronic anticoagulation, discharged on Coumadin 07/09/12  Hyperkalemia,  not on ACE or ARB.  CK and Lactic acid WNL  Hypotension, limiting medical Rx  LBBB (left bundle branch block)  DM, Type 2 IDDM  Anemia, heme positive   Plan- Will discuss with MD- LifeVest prior to discharge. She will also need HHRN/PT. She remains anemic though HGb is stable. She did have one stool checked and it was qiuac positive. She is on Coumadin. Will check follow up stool. I will call about LifeVest but I doubt this can arranged today.   Corine Shelter PA-C 07/21/2012, 10:35 AM   I have seen and examined the patient along with Corine Shelter PA-C.  I have reviewed the chart, notes and new data.  I agree with PA's note.  Possible DC tomorrow. LifeVest is appropriate.  Thurmon Fair, MD, [facility] [phone] 07/21/2012, 11:22 AM

## 2012-07-21 NOTE — Progress Notes (Signed)
I cosign Rashida Haney, RN's assessment, med administration, notes, I/O, and care plan/education. 

## 2012-07-22 LAB — PROTIME-INR
INR: 2.43 — ABNORMAL HIGH (ref 0.00–1.49)
Prothrombin Time: 25.3 seconds — ABNORMAL HIGH (ref 11.6–15.2)

## 2012-07-22 LAB — CBC
HCT: 28.6 % — ABNORMAL LOW (ref 36.0–46.0)
Hemoglobin: 9.2 g/dL — ABNORMAL LOW (ref 12.0–15.0)
MCH: 25.5 pg — ABNORMAL LOW (ref 26.0–34.0)
MCHC: 32.2 g/dL (ref 30.0–36.0)
MCV: 79.2 fL (ref 78.0–100.0)
Platelets: 296 10*3/uL (ref 150–400)
RBC: 3.61 MIL/uL — ABNORMAL LOW (ref 3.87–5.11)
RDW: 15.8 % — ABNORMAL HIGH (ref 11.5–15.5)
WBC: 8.3 10*3/uL (ref 4.0–10.5)

## 2012-07-22 LAB — BASIC METABOLIC PANEL
CO2: 26 mEq/L (ref 19–32)
Calcium: 12.5 mg/dL — ABNORMAL HIGH (ref 8.4–10.5)
GFR calc non Af Amer: 31 mL/min — ABNORMAL LOW (ref >90)
Glucose, Bld: 218 mg/dL — ABNORMAL HIGH (ref 70–99)
Potassium: 4.5 mEq/L (ref 3.5–5.1)
Sodium: 129 mEq/L — ABNORMAL LOW (ref 135–145)

## 2012-07-22 LAB — GLUCOSE, CAPILLARY
Glucose-Capillary: 165 mg/dL — ABNORMAL HIGH (ref 70–99)
Glucose-Capillary: 138 mg/dL — ABNORMAL HIGH (ref 70–99)

## 2012-07-22 LAB — OCCULT BLOOD X 1 CARD TO LAB, STOOL: Fecal Occult Bld: NEGATIVE

## 2012-07-22 NOTE — Progress Notes (Signed)
ANTICOAGULATION CONSULT NOTE - Follow Up Consult  Pharmacy Consult for Coumadin Indication: hx DVT  Allergies  Allergen Reactions  . Penicillins Anaphylaxis  . Sulfa Antibiotics Anaphylaxis   Vital Signs: Temp: 98.3 F (36.8 C) (11/29 0825) Temp src: Oral (11/29 0825) BP: 99/54 mmHg (11/29 0825) Pulse Rate: 82  (11/29 0825)  Labs:  Basename 07/22/12 0510 07/21/12 0636 07/20/12 0520  HGB 9.2* -- 9.3*  HCT 28.6* -- 29.4*  PLT 296 -- 354  APTT -- -- --  LABPROT 25.3* -- 26.1*  INR 2.43* -- 2.54*  HEPARINUNFRC -- -- --  CREATININE 1.68* 1.49* 1.64*  CKTOTAL -- -- --  CKMB -- -- --  TROPONINI -- -- --    Estimated Creatinine Clearance: 33.9 ml/min (by C-G formula based on Cr of 1.68).  Assessment: 65yof continues on Coumadin for hx DVT of lower extremities (06/22/12) with R popliteal artery thrombus (06/29/12).  She is without noted bleeding complications with her anticoagulation.  INR remains within therapeutic range this morning at 2.43.  Her CBC and platelets are stable.  Home dose is 5 mg daily.  Goal of Therapy:  INR 2-3 Monitor platelets by anticoagulation protocol: Yes   Plan:  Coumadin 5mg  daily and f/u protime MWF.  Nadara Mustard, PharmD., MS Clinical Pharmacist Pager:  [phone] Thank you for allowing pharmacy to be part of this patients care team. 07/22/2012 9:28 AM

## 2012-07-22 NOTE — Progress Notes (Signed)
Subjective:  No new complaints  Objective:  Vital Signs in the last 24 hours: Temp:  [98.3 F (36.8 C)-98.8 F (37.1 C)] 98.3 F (36.8 C) (11/29 0453) Pulse Rate:  [74-79] 77  (11/29 0453) Resp:  [18-19] 18  (11/29 0453) BP: (84-107)/(46-58) 96/51 mmHg (11/29 0453) SpO2:  [95 %-96 %] 95 % (11/29 0453) Weight:  [71.895 kg (158 lb 8 oz)] 71.895 kg (158 lb 8 oz) (11/29 0453)  Intake/Output from previous day:  Intake/Output Summary (Last 24 hours) at 07/22/12 0814 Last data filed at 07/21/12 2117  Gross per 24 hour  Intake     63 ml  Output    575 ml  Net   -512 ml    Physical Exam: General appearance: alert, cooperative and no distress Lungs: few basilar crackles Cardiac: RRR   Rate: 76  Rhythm: normal sinus rhythm  Lab Results:  Basename 07/22/12 0510 07/20/12 0520  WBC 8.3 9.6  HGB 9.2* 9.3*  PLT 296 354    Basename 07/22/12 0510 07/21/12 0636  NA 129* 132*  K 4.5 4.3  CL 95* 98  CO2 26 28  GLUCOSE 218* 125*  BUN 35* 34*  CREATININE 1.68* 1.49*   No results found for this basename: TROPONINI:2,CK,MB:2 in the last 72 hours Hepatic Function Panel No results found for this basename: PROT,ALBUMIN,AST,ALT,ALKPHOS,BILITOT,BILIDIR,IBILI in the last 72 hours No results found for this basename: CHOL in the last 72 hours  Basename 07/22/12 0510  INR 2.43*    Imaging: Imaging results have been reviewed  Cardiac Studies:  Assessment/Plan:   Principal Problem:  *Acute systolic CHF, just discharged 98/11/91 Active Problems:  CAD, 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 ischemic Rt foot  Chronic anticoagulation, discharged on Coumadin 07/09/12  Hyperkalemia, not on ACE or ARB.   CK and Lactic acid WNL  Hypotension, limiting medical Rx  LBBB (left bundle branch block)  DM, Type 2 IDDM  Anemia, heme positive   Plan- Lifevest requested. Hgb stable,    Corine Shelter PA-C 07/22/2012, 8:14 AM  I have seen and examined the patient along with Corine Shelter PA-C.  I have reviewed the chart, notes and new data.  I agree with PA's note.  Physically better. The bottom line is that psychologically she is not ready to go home. Today she says that her boyfriend Clide Cliff is out of town until [employer] account [employer]. She refuses inpatient nursing facility and even questions need for [facility] nurse since "Clide Cliff can take care of everything".  PLAN: Continue DC plans. May need to get additional help from SW/CM to enable hospital discharge.  Thurmon Fair, MD, [facility] [phone] 07/22/2012, 10:38 AM

## 2012-07-22 NOTE — Progress Notes (Signed)
I see plans are being made for eventual discharge.  Creatinine fairly stable.  I have made a follow up appt for this patient on January 14 at 11:15.  Patient was informed of this appt.  Not sure what time table is for any interventions.    Call if any questions.   [name] [name] A

## 2012-07-22 NOTE — Progress Notes (Signed)
Physical Therapy Treatment Patient Details Name: [name] [name] MRN: [id number] DOB: 1945-11-06 Today's Date: 07/22/2012 Time: 4098-1191 PT Time Calculation (min): 31 min  PT Assessment / Plan / Recommendation Comments on Treatment Session  Pt. progressing with her mobility.  Still somewhat unsteady with distraction, needing min assist to correct loss of balance.    Follow Up Recommendations  Home health PT;Supervision for mobility/OOB     Does the patient have the potential to tolerate intense rehabilitation     Barriers to Discharge        Equipment Recommendations  Rolling walker with 5" wheels    Recommendations for Other Services    Frequency Min 3X/week   Plan Discharge plan remains appropriate;Frequency remains appropriate    Precautions / Restrictions Precautions Precautions: Fall Restrictions Weight Bearing Restrictions: No   Pertinent Vitals/Pain No pain, no distress    Mobility  Bed Mobility Bed Mobility: Supine to Sit;Sitting - Scoot to Edge of Bed Supine to Sit: 5: Supervision;With rails Sitting - Scoot to Edge of Bed: 6: Modified independent (Device/Increase time) Details for Bed Mobility Assistance: supervision for safety in moving supine to sit Transfers Transfers: Sit to Stand;Stand to Sit Sit to Stand: 5: Supervision;With upper extremity assist;From bed Stand to Sit: 5: Supervision;With upper extremity assist;To bed Details for Transfer Assistance: reminder for safe hand placement in stand and sit Ambulation/Gait Ambulation/Gait Assistance: 5: Supervision;4: Min assist Ambulation Distance (Feet): 120 Feet Assistive device: Rolling walker Ambulation/Gait Assistance Details: One mild loss of balance noted when a nursing tech came up and gave pt a hug,  Needed min assist to correct Gait Pattern: Step-through pattern Gait velocity: slowed Stairs: No    Exercises     PT Diagnosis:    PT Problem List:   PT Treatment Interventions:     PT  Goals Acute Rehab PT Goals PT Goal: Supine/Side to Sit - Progress: Progressing toward goal PT Goal: Sit to Supine/Side - Progress: Progressing toward goal PT Goal: Sit to Stand - Progress: Progressing toward goal PT Goal: Stand to Sit - Progress: Progressing toward goal PT Goal: Ambulate - Progress: Progressing toward goal  Visit Information  Last PT Received On: 07/22/12 Assistance Needed: +1    Subjective Data  Subjective: I walked a little this morning already   Cognition  Overall Cognitive Status: Appears within functional limits for tasks assessed/performed Arousal/Alertness: Awake/alert Orientation Level: Appears intact for tasks assessed Behavior During Session: [facility] for tasks performed    Balance     End of Session PT - End of Session Equipment Utilized During Treatment: Gait belt Activity Tolerance: Patient tolerated treatment well Patient left: in bed;with call bell/phone within reach Nurse Communication: Mobility status   GP     Ferman Hamming 07/22/2012, 1:08 PM Weldon Picking PT Acute Rehab Services [phone] [place] [phone]

## 2012-07-22 NOTE — Progress Notes (Signed)
Cardiac Rehab Phase I   Patient states she is doing well and has been ambulating all morning. Patient refuses walk with cardiac rehab. Will continue to monitor and follow patient as needed in the AM.

## 2012-07-23 LAB — BASIC METABOLIC PANEL
BUN: 37 mg/dL — ABNORMAL HIGH (ref 6–23)
Chloride: 99 mEq/L (ref 96–112)
GFR calc Af Amer: 38 mL/min — ABNORMAL LOW (ref >90)
GFR calc non Af Amer: 33 mL/min — ABNORMAL LOW (ref >90)
Potassium: 4.7 mEq/L (ref 3.5–5.1)
Sodium: 131 mEq/L — ABNORMAL LOW (ref 135–145)

## 2012-07-23 LAB — GLUCOSE, CAPILLARY
Glucose-Capillary: 159 mg/dL — ABNORMAL HIGH (ref 70–99)
Glucose-Capillary: 133 mg/dL — ABNORMAL HIGH (ref 70–99)

## 2012-07-23 MED ORDER — INSULIN ASPART PROT & ASPART (70-30 MIX) 100 UNIT/ML ~~LOC~~ SUSP
12.0000 [IU] | Freq: Two times a day (BID) | SUBCUTANEOUS | Status: DC
  Administered 2012-07-23 – 2012-07-24 (×3): 12 [IU] via SUBCUTANEOUS
  Filled 2012-07-23: qty 10

## 2012-07-23 NOTE — Progress Notes (Signed)
Subjective:  No increased SOB  Objective:  Vital Signs in the last 24 hours: Temp:  [97.8 F (36.6 C)-98.7 F (37.1 C)] 97.8 F (36.6 C) (11/30 0509) Pulse Rate:  [79-84] 84  (11/30 0509) Resp:  [18-20] 20  (11/30 0509) BP: (95-106)/(52-61) 106/61 mmHg (11/30 0509) SpO2:  [96 %-98 %] 96 % (11/30 0509) Weight:  [71.8 kg (158 lb 4.6 oz)] 71.8 kg (158 lb 4.6 oz) (11/30 0509)  Intake/Output from previous day:  Intake/Output Summary (Last 24 hours) at 07/23/12 0845 Last data filed at 07/23/12 0833  Gross per 24 hour  Intake   1270 ml  Output    950 ml  Net    320 ml    Physical Exam: General appearance: alert, cooperative and no distress Lungs: clear to auscultation bilaterally Heart: regular rate and rhythm   Rate: 84  Rhythm: normal sinus rhythm  Lab Results:  Basename 07/22/12 0510  WBC 8.3  HGB 9.2*  PLT 296    Basename 07/23/12 0520 07/22/12 0510  NA 131* 129*  K 4.7 4.5  CL 99 95*  CO2 25 26  GLUCOSE 171* 218*  BUN 37* 35*  CREATININE 1.61* 1.68*   No results found for this basename: TROPONINI:2,CK,MB:2 in the last 72 hours Hepatic Function Panel No results found for this basename: PROT,ALBUMIN,AST,ALT,ALKPHOS,BILITOT,BILIDIR,IBILI in the last 72 hours No results found for this basename: CHOL in the last 72 hours  Basename 07/22/12 0510  INR 2.43*    Imaging: Imaging results have been reviewed  Cardiac Studies:  Assessment/Plan:   Principal Problem:  *Acute systolic CHF, just discharged 16/10/96 Active Problems:  CAD, 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  CK and Lactic acid WNL  Hypotension, limiting medical Rx  LBBB (left bundle branch block)  DM, Type 2 IDDM  Anemia, heme negative 11/29  Plan- Awaiting LifeVest and for her to arrange for someone to be with her after discharge. Hopefully home in am. She is not on an ACE or ARB secondary to hyperkalemia and renal insufficiency.  Corine Shelter PA-C 07/23/2012, 8:45 AM  I have seen and examined the patient along with Corine Shelter PA-C.  I have reviewed the chart, notes and new data.  I agree with PA's note.  LifeVest still needs to be placed and her boyfriend is now not expected back in town until [employer]. Hopefully DC tomorrow.  Thurmon Fair, MD, [facility] [phone] 07/23/2012, 8:49 AM\

## 2012-07-23 NOTE — Progress Notes (Signed)
Tresa Endo from [employer] called to see patient , patient  Stated she wants one of her family to present when given instruction, but that family can not come till tomorrow morning. Tresa Endo will see patient at 1100 am tomorrow.

## 2012-07-24 DIAGNOSIS — R112 Nausea with vomiting, unspecified: Secondary | ICD-10-CM | POA: Diagnosis not present

## 2012-07-24 LAB — COMPREHENSIVE METABOLIC PANEL
ALT: 18 U/L (ref 0–35)
AST: 17 U/L (ref 0–37)
Albumin: 2.8 g/dL — ABNORMAL LOW (ref 3.5–5.2)
Alkaline Phosphatase: 207 U/L — ABNORMAL HIGH (ref 39–117)
BUN: 36 mg/dL — ABNORMAL HIGH (ref 6–23)
CO2: 24 mEq/L (ref 19–32)
Calcium: 12.3 mg/dL — ABNORMAL HIGH (ref 8.4–10.5)
Chloride: 100 mEq/L (ref 96–112)
Creatinine, Ser: 1.53 mg/dL — ABNORMAL HIGH (ref 0.50–1.10)
GFR calc Af Amer: 40 mL/min — ABNORMAL LOW (ref >90)
GFR calc non Af Amer: 35 mL/min — ABNORMAL LOW (ref >90)
Glucose, Bld: 111 mg/dL — ABNORMAL HIGH (ref 70–99)
Potassium: 4.8 mEq/L (ref 3.5–5.1)
Sodium: 133 mEq/L — ABNORMAL LOW (ref 135–145)
Total Bilirubin: 0.3 mg/dL (ref 0.3–1.2)
Total Protein: 7.2 g/dL (ref 6.0–8.3)

## 2012-07-24 LAB — BASIC METABOLIC PANEL
BUN: 39 mg/dL — ABNORMAL HIGH (ref 6–23)
Calcium: 12.3 mg/dL — ABNORMAL HIGH (ref 8.4–10.5)
Chloride: 100 mEq/L (ref 96–112)
Creatinine, Ser: 1.51 mg/dL — ABNORMAL HIGH (ref 0.50–1.10)
GFR calc Af Amer: 41 mL/min — ABNORMAL LOW (ref >90)

## 2012-07-24 LAB — AMYLASE: Amylase: 47 U/L (ref 0–105)

## 2012-07-24 LAB — GLUCOSE, CAPILLARY: Glucose-Capillary: 144 mg/dL — ABNORMAL HIGH (ref 70–99)

## 2012-07-24 LAB — LIPASE, BLOOD: Lipase: 35 U/L (ref 11–59)

## 2012-07-24 MED ORDER — SODIUM CHLORIDE 0.9 % IV SOLN
INTRAVENOUS | Status: AC
  Administered 2012-07-24: 18:00:00 via INTRAVENOUS

## 2012-07-24 NOTE — Progress Notes (Addendum)
Subjective:  Nausea and vomiting overnight and this am.  Objective:  Vital Signs in the last 24 hours: Temp:  [98.2 F (36.8 C)-98.8 F (37.1 C)] 98.3 F (36.8 C) (12/01 0453) Pulse Rate:  [73-76] 76  (12/01 0453) Resp:  [18] 18  (12/01 0453) BP: (90-102)/(51-57) 102/57 mmHg (12/01 0453) SpO2:  [96 %-100 %] 100 % (12/01 0453) Weight:  [72 kg (158 lb 11.7 oz)] 72 kg (158 lb 11.7 oz) (12/01 0453)  Intake/Output from previous day:  Intake/Output Summary (Last 24 hours) at 07/24/12 0937 Last data filed at 07/24/12 0934  Gross per 24 hour  Intake   1260 ml  Output   1325 ml  Net    -65 ml    Physical Exam: General appearance: alert, cooperative and no distress Lungs: clear to auscultation bilaterally Heart: regular rate and rhythm Abdomen: obese, non tender, not distended   Rate: 78  Rhythm: normal sinus rhythm  Lab Results:  Basename 07/22/12 0510  WBC 8.3  HGB 9.2*  PLT 296    Basename 07/24/12 0454 07/23/12 0520  NA 132* 131*  K 5.2* 4.7  CL 100 99  CO2 25 25  GLUCOSE 146* 171*  BUN 39* 37*  CREATININE 1.51* 1.61*   No results found for this basename: TROPONINI:2,CK,MB:2 in the last 72 hours Hepatic Function Panel No results found for this basename: PROT,ALBUMIN,AST,ALT,ALKPHOS,BILITOT,BILIDIR,IBILI in the last 72 hours No results found for this basename: CHOL in the last 72 hours  Basename 07/22/12 0510  INR 2.43*    Imaging: Imaging results have been reviewed  Assessment/Plan:   Principal Problem:  *Acute systolic CHF, just discharged 46/96/29 Active Problems:  CAD, 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  Clear liquids today. LifeVest to be fitted this am. Hold 70/30 insulin and continue sliding scale  Corine Shelter PA-C 07/24/2012, 9:37 AM  I have seen and evaluated the patient this morning along with Corine Shelter, PA. I agree with his findings, examination as well as impression recommendations.  Very difficult situation -- not sure what brought on N/V this AM -- agree with evaluating for GI source.  We have not changed meds in a couple days - but have held Lasix.  I am concerned that some of her Sx may simply be low CO HF Sx vs RHF (? Congestive hepatopathy as her RHC numbers last admit revealed RVEDP 14 mmHg).  Despite this - on exam, she has no notable JVD or HJR, which would suggest that she is indeed dehydrated.  Will gently hydrate today - ~578ml @ 32ml/hr.  If Sx persist - may need RHC to reassess & determine if she would benefit from Milrinone. She remains hyperCalcemic - appreciate Dr. Jon Gills attention.  Will monitor o/n to see if Sx improve.  I am also not sure if her sudden  onset of Sx relates to planned d/c -- she does not seem confident in her ability to thrive as an OP.  R foot continues to do better. INR therapeutic.  Is on 70/30 Insulin which may be a better option for DM coverage -- will hold while on decreased diet.  CBGs have been in 130s-140s.   Marykay Lex, M.D., M.S. [facility] [address] [place], [place]  [address]  [phone] Pager # [phone] 07/24/2012 10:11 AM

## 2012-07-24 NOTE — Progress Notes (Signed)
Called by cards to ask about hypercalcemia.  Honestly I did not notice when I was following patient earlier.  She has an elevated PTH as well so likely actually has primary hyperparathyroidism.  For discharge, I would just tell her to avoid any calcium/vitami D or TUMS.  The treatment of this is not clear cut.  If she does not have chronic complications such as kidney stones the indications for parathyroidectomy are less.  I will look at this when I see her as an OP and will talk to her about sensipar which has been approved for this indication and would likely be the easiest fix.    [name] [name]

## 2012-07-25 LAB — BASIC METABOLIC PANEL
CO2: 22 mEq/L (ref 19–32)
Calcium: 11.8 mg/dL — ABNORMAL HIGH (ref 8.4–10.5)
Creatinine, Ser: 1.44 mg/dL — ABNORMAL HIGH (ref 0.50–1.10)
Glucose, Bld: 140 mg/dL — ABNORMAL HIGH (ref 70–99)

## 2012-07-25 LAB — GLUCOSE, CAPILLARY: Glucose-Capillary: 140 mg/dL — ABNORMAL HIGH (ref 70–99)

## 2012-07-25 LAB — PROTIME-INR: INR: 2.9 — ABNORMAL HIGH (ref 0.00–1.49)

## 2012-07-25 MED ORDER — PROMETHAZINE HCL 12.5 MG PO TABS: 12.5000 mg | ORAL_TABLET | Freq: Four times a day (QID) | ORAL | Status: DC | PRN

## 2012-07-25 MED ORDER — FUROSEMIDE 20 MG PO TABS: 20.0000 mg | ORAL_TABLET | ORAL | Status: DC | PRN

## 2012-07-25 MED ORDER — RANOLAZINE ER 500 MG PO TB12: 500.0000 mg | ORAL_TABLET | Freq: Two times a day (BID) | ORAL | Status: DC

## 2012-07-25 MED ORDER — CARVEDILOL 6.25 MG PO TABS: 6.2500 mg | ORAL_TABLET | Freq: Two times a day (BID) | ORAL | Status: DC

## 2012-07-25 MED ORDER — INSULIN ASPART 100 UNIT/ML ~~LOC~~ SOLN: 0.0000 [IU] | Freq: Three times a day (TID) | SUBCUTANEOUS | Status: DC

## 2012-07-25 MED ORDER — ISOSORBIDE MONONITRATE ER 30 MG PO TB24: 30.0000 mg | ORAL_TABLET | Freq: Every day | ORAL | Status: DC

## 2012-07-25 NOTE — Discharge Summary (Signed)
Physician Discharge Summary  Patient ID: [name] [name] MRN: [id number] DOB/AGE: [birth year] 66 y.o.  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 She needs CABG  Chronic anticoagulation, discharged on Coumadin 07/09/12  PAF, on admission 07/12/12  Anemia  Hyperkalemia, not on ACE or ARB.  CK and Lactic acid WNL  Hypotension, limiting medical Rx  Hypercalcemia, initially felt to be secondary to overdiuresis but may be primary hyperparathyroid  Nausea & vomiting- started 12/1  Cellulitis, right foot.  Discharged Condition: stable  Hospital Course:   This is a 66 y.o. female who was previously discharged after being hospitalized 06/21/12-07/09/12 for Lower Extremity edema -- bilateral DVT & R Popliteal Artery occlusion. We saw her in consult, then transferred her to [facility] service after LHC demonstrated severe 3V disease. She was not felt to be a candidate at that time for CABG and the plan was to treat her multiple medical issues and then plan elective CABG at a later date. She was discharged on Coumadin. She presented back to the ED on 07/11/12 with a complaint of SOB. Her  BNP was 10K and she also had PAF in the ED.  She was admitted for acute on chronic systolic CHF and was started on IV Lasix along with oral Aldactone.  Subsequently, she had an increase in K+, and the Aldactone was d/c.  Ranexa was also added as an antianginal.  DVT of the right posterior  tibial vein and left profunda vein found during previous admission on 06/22/12.  Coumadin started at that time and continued this admission.   Toprol was changed to Coreg.  Nephrology was consulted due to CKD stage three, hypercalcemia.  Hydralazine, which had been initiated earlier, was discontinued to allow higher BP to facilitate better renal perfusion.  Lasix was discontinued as well.  CSW discussed SNF placement and the patient was adamant that she would go home.  Cellulitis in right foot improved.  2D echo revealed EF of 30%, diffuse hypokinesis and grade 2 diastolic dysfunction(See full report below).  Life vest was arranged, fitted and discharged with the patient.  She complained of diarrhea at one point and was given imodium.  Hypercalcemia likely related to primary hyperparathyroidism with elevated PTH.  Follow-up required.  Elective CABG in the near future.  She was given NS the day before DC and felt better.   ACE and ARB not prescribed secondary to RI and hyperkalemia.   The pt was last seen and examined by Dr. Tresa Endo. He agrees that she is stable and ready for discharge.    Consults: nephrology, Diabetes [place].  Internal Medicine. Cardiac rehab.  PT   Significant Diagnostic Studies:  labs:  BNP (last 3 results)  Basename 07/20/12 0520 07/17/12 0738 07/16/12 0514  PROBNP 4108.0* 4495.0* 4443.0*   Imaging:  CHEST - 2 VIEW  Comparison: 06/27/2012.  Findings: The heart is borderline enlarged but stable. Overall  improved lung aeration. Mild vascular congestion but no  interstitial edema.  There are persistent bilateral lower lobe  infiltrates and/or atelectasis but resolving pleural effusions.  IMPRESSION:  Persistent bibasilar atelectasis or infiltrates but resolving  pleural effusions.  Overall improved aeration with mild vascular congestion.  Original Report Authenticated By: Rudie Meyer, M.D.   Echocardiogram w/o contrast 07/20/12 Study Conclusions  - Left ventricle:  The cavity size was mildly dilated. Wall thickness was normal. Systolic function was moderately to severely reduced. The estimated ejection fraction was 30%. Diffuse hypokinesis. Features are consistent with a pseudonormal left ventricular filling pattern, with concomitant abnormal relaxation and increased filling pressure (grade 2 diastolic dysfunction). Doppler parameters are consistent with both elevated ventricular end-diastolic filling pressure and elevated left atrial filling pressure. - Aortic valve: Mildly calcified annulus. Trileaflet. - Mitral valve: Mildly calcified annulus. Mildly thickened leaflets . Mild regurgitation. - Atrial septum: No defect or patent foramen ovale was identified. - Pericardium, extracardiac: A trivial pericardial effusion was identified. Transthoracic echocardiography. M-mode, limited 2D, limited spectral Doppler, and color Doppler. Height: Height: 167.6cm. Height: 66in. Weight: Weight: 70.9kg. Weight: 156lb. Body mass index: BMI: 25.2kg/m^2. Body surface area: BSA: 1.62m^2. Blood pressure: 104/55. Patient status: Inpatient. Location: Bedside.   Treatments: see hospital course above  Discharge Exam: Blood pressure 116/64, pulse 74, temperature 98.1 F (36.7 C), temperature source Oral, resp. rate 16, height 5\' 6"  (1.676 m), weight 73 kg (160 lb 15 oz), SpO2 98.00%.   Disposition: 01-Home or Self Care     Medication List     As of 07/25/2012  2:18 PM    STOP taking these medications         enoxaparin 150 MG/ML injection   Commonly known as: LOVENOX      feeding supplement Liqd      insulin glargine 100 UNIT/ML injection   Commonly known as: LANTUS      metoprolol succinate 25 MG 24 hr tablet   Commonly known as: TOPROL-XL      TAKE these medications         acetaminophen 325 MG tablet   Commonly known as: TYLENOL   Take 2 tablets (650 mg total) by mouth every 4 (four) hours as needed.      aspirin 81 MG EC tablet   Take 1  tablet (81 mg total) by mouth daily.      atorvastatin 10 MG tablet   Commonly known as: LIPITOR   Take 1 tablet (10 mg total) by mouth daily at 6 PM.      carvedilol 6.25 MG tablet   Commonly known as: COREG   Take 1 tablet (6.25 mg total) by mouth 2 (two) times daily with a meal.      furosemide 40 MG tablet   Commonly known as: LASIX   Take 1 tablet (40 mg total) by mouth daily.      furosemide 20 MG tablet   Commonly known as: LASIX   Take 1 tablet (20 mg total) by mouth as needed.      insulin aspart 100 UNIT/ML injection   Commonly known as: novoLOG   Inject 0-9 Units into the skin 3 (three) times daily with meals.      isosorbide mononitrate 30 MG 24 hr tablet   Commonly known as: IMDUR   Take 1 tablet (30 mg total) by mouth daily.      LORazepam 0.5 MG tablet   Commonly known as: ATIVAN   Take 0.5 tablets (0.25 mg total) by mouth every 12 (twelve) hours as needed for anxiety.  metFORMIN 500 MG tablet   Commonly known as: GLUCOPHAGE   Take 1 tablet (500 mg total) by mouth 2 (two) times daily with a meal.      multivitamin Liqd   Take 5 mLs by mouth daily.      pantoprazole 40 MG tablet   Commonly known as: PROTONIX   Take 1 tablet (40 mg total) by mouth daily at 6 (six) AM.      promethazine 12.5 MG tablet   Commonly known as: PHENERGAN   Take 1 tablet (12.5 mg total) by mouth every 6 (six) hours as needed for nausea.      ranolazine 500 MG 12 hr tablet   Commonly known as: RANEXA   Take 1 tablet (500 mg total) by mouth 2 (two) times daily.      spironolactone 12.5 mg Tabs   Commonly known as: ALDACTONE   Take 0.5 tablets (12.5 mg total) by mouth daily.      warfarin 5 MG tablet   Commonly known as: COUMADIN   Take 1 tablet (5 mg total) by mouth daily.           Follow-up Information    Follow up with Lennette Bihari, MD. On 08/08/2012. (Follow up with Dr. Tresa Endo on Monday Dec. 16 at 10:45 am)    Contact information:   [address] [place] [address] [phone]       Follow up with Lennette Bihari, MD. On 07/27/2012. (this is for an INR check only at 2:10 pm )    Contact information:   [address] [place] [address] [phone]          Signed: Wilburt Finlay 07/25/2012, 2:18 PM

## 2012-07-25 NOTE — Progress Notes (Signed)
Physical Therapy Treatment Patient Details Name: [name] [name] MRN: [id number] DOB: [birth year] Today's Date: 07/25/2012 Time: 4098-1191 PT Time Calculation (min): 28 min  PT Assessment / Plan / Recommendation Comments on Treatment Session  Plan is to d/c home today with the support of her husband. Reviewed safety with RW and assist with OOB/mobility. Pt then able to practice steps (1 step x2) with minA and use of RW.     Follow Up Recommendations  Home health PT;Supervision for mobility/OOB     Does the patient have the potential to tolerate intense rehabilitation     Barriers to Discharge        Equipment Recommendations  Rolling walker with 5" wheels    Recommendations for Other Services    Frequency Min 3X/week   Plan Discharge plan remains appropriate;Frequency remains appropriate    Precautions / Restrictions Precautions Precautions: Fall Restrictions Weight Bearing Restrictions: No       Mobility  Bed Mobility Bed Mobility: Not assessed Transfers Transfers: Sit to Stand;Stand to Sit Sit to Stand: With upper extremity assist;From bed;From chair/3-in-1;5: Supervision Stand to Sit: With upper extremity assist;To bed;To chair/3-in-1;5: Supervision Details for Transfer Assistance: cues for hand placement especially when standing from chair without armrests Ambulation/Gait Ambulation/Gait Assistance: 5: Supervision Ambulation Distance (Feet): 160 Feet Assistive device: Rolling walker Ambulation/Gait Assistance Details: cues for tall posture, 2 standing rest breaks because of dizziness, 1 seated rest break in prep for stair training after 80 ft; pt then ambulated 80 ft back to her room Gait Pattern: Trunk flexed;Shuffle;Step-through pattern Stairs: Yes Stairs Assistance: 4: Min Editor, commissioning Details (indicate cue type and reason): assist to steady walker and pt (PT standing next to pt on the left); cues for technique; pt then able to verbalize  positioning of her husband for gaurding at home Stair Management Technique: With walker;Backwards Number of Stairs: 1  (x2)      PT Goals Acute Rehab PT Goals PT Goal: Sit to Stand - Progress: Progressing toward goal PT Goal: Stand to Sit - Progress: Progressing toward goal PT Transfer Goal: Bed to Chair/Chair to Bed - Progress: Progressing toward goal PT Goal: Ambulate - Progress: Progressing toward goal PT Goal: Up/Down Stairs - Progress: Progressing toward goal  Visit Information  Last PT Received On: 07/25/12 Assistance Needed: +1    Subjective Data  Subjective: Im just very nervous about going up the steps at home.   Cognition  Overall Cognitive Status: Appears within functional limits for tasks assessed/performed Arousal/Alertness: Awake/alert Orientation Level: Appears intact for tasks assessed Behavior During Session: [facility] for tasks performed    Balance     End of Session PT - End of Session Equipment Utilized During Treatment: Gait belt Activity Tolerance: Patient tolerated treatment well Patient left: in bed;with call bell/phone within reach;with nursing in room Nurse Communication: Mobility status   GP     [facility] HELEN 07/25/2012, 12:52 PM

## 2012-07-25 NOTE — Progress Notes (Signed)
ANTICOAGULATION CONSULT NOTE - Follow Up Consult  Pharmacy Consult for Coumadin Indication: hx DVT  Allergies  Allergen Reactions  . Penicillins Anaphylaxis  . Sulfa Antibiotics Anaphylaxis   Labs:  Basename 07/25/12 0630 07/24/12 0944 07/24/12 0454  HGB -- -- --  HCT -- -- --  PLT -- -- --  APTT -- -- --  LABPROT 28.8* -- --  INR 2.90* -- --  HEPARINUNFRC -- -- --  CREATININE 1.44* 1.53* 1.51*  CKTOTAL -- -- --  CKMB -- -- --  TROPONINI -- -- --    Estimated Creatinine Clearance: 39.8 ml/min (by C-G formula based on Cr of 1.44).  Assessment: 65yof continues on Coumadin for hx DVT of lower extremities (06/22/12) with R popliteal artery thrombus (06/29/12).  She is without noted bleeding complications with her anticoagulation.  INR remains within therapeutic range this morning at 2.9.  Her CBC and platelets are stable.  Home dose is 5 mg daily.  Goal of Therapy:  INR 2-3 Monitor platelets by anticoagulation protocol: Yes   Plan:  Coumadin 5mg  daily and f/u protime MWF.  Thank you. Okey Regal, PharmD [phone]  07/25/2012 8:28 AM

## 2012-07-25 NOTE — Progress Notes (Signed)
[facility]  Subjective: A little nausea.  No SOB, CP, or ABD pain.  Objective: Vital signs in last 24 hours: Temp:  [97.8 F (36.6 C)-98.1 F (36.7 C)] 98.1 F (36.7 C) (12/02 0431) Pulse Rate:  [71-77] 75  (12/02 0431) Resp:  [16-18] 18  (12/02 0431) BP: (102-116)/(41-64) 116/64 mmHg (12/02 0431) SpO2:  [96 %-99 %] 97 % (12/02 0431) Weight:  [73 kg (160 lb 15 oz)] 73 kg (160 lb 15 oz) (12/02 0431) Last BM Date: 08/22/12  Intake/Output from previous day: 12/01 0701 - 12/02 0700 In: 800 [P.O.:800] Out: 1451 [Urine:1450; Stool:1] Intake/Output this shift:    Medications Current Facility-Administered Medications  Medication Dose Route Frequency Provider Last Rate Last Dose  . [EXPIRED] 0.9 %  sodium chloride infusion   Intravenous Continuous Eda Paschal [place], [place] 75 mL/hr at 07/24/12 1810    . acetaminophen (TYLENOL) tablet 650 mg  650 mg Oral Q6H PRN Eduard Clos, MD   650 mg at 07/16/12 2356   Or  . acetaminophen (TYLENOL) suppository 650 mg  650 mg Rectal Q6H PRN Eduard Clos, MD      . alum & mag hydroxide-simeth (MAALOX/MYLANTA) 200-200-20 MG/5ML suspension 30 mL  30 mL Oral Q6H PRN Elease Etienne, MD   30 mL at 07/21/12 1206  . aspirin EC tablet 81 mg  81 mg Oral Daily Eduard Clos, MD   81 mg at 07/24/12 1017  . atorvastatin (LIPITOR) tablet 20 mg  20 mg Oral q1800 Lennette Bihari, MD   20 mg at 07/24/12 1754  . carvedilol (COREG) tablet 6.25 mg  6.25 mg Oral BID WC Lennette Bihari, MD   6.25 mg at 07/25/12 1914  . docusate sodium (COLACE) capsule 100 mg  100 mg Oral BID Kerry Hough, PA   100 mg at 07/24/12 2141  . feeding supplement (ENSURE COMPLETE) liquid 237 mL  237 mL Oral BID BM Eduard Clos, MD   237 mL at 07/19/12 1400  . insulin aspart (novoLOG) injection 0-9 Units  0-9 Units Subcutaneous TID WC Eduard Clos, MD   1 Units at 07/25/12 [id number]  . isosorbide mononitrate (IMDUR) 24 hr tablet 30 mg  30 mg  Oral Daily Eda Paschal [place], [place]   30 mg at 07/24/12 1018  . LORazepam (ATIVAN) tablet 0.5 mg  0.5 mg Oral Q6H PRN Abelino Derrick, PA   0.5 mg at 07/21/12 0351  . ondansetron (ZOFRAN) tablet 4 mg  4 mg Oral Q6H PRN Eduard Clos, MD   4 mg at 07/25/12 0426   Or  . ondansetron (ZOFRAN) injection 4 mg  4 mg Intravenous Q6H PRN Eduard Clos, MD   4 mg at 07/23/12 2305  . pantoprazole (PROTONIX) EC tablet 40 mg  40 mg Oral BID AC Eda Paschal [place], [place]   40 mg at 07/25/12 5621  . polyethylene glycol (MIRALAX / GLYCOLAX) packet 17 g  17 g Oral BID Zannie Cove, MD   17 g at 07/23/12 0909  . ranolazine (RANEXA) 12 hr tablet 500 mg  500 mg Oral BID Lennette Bihari, MD   500 mg at 07/24/12 2141  . traMADol (ULTRAM) tablet 50 mg  50 mg Oral Q6H PRN Abelino Derrick, PA   50 mg at 07/23/12 0905  . warfarin (COUMADIN) tablet 5 mg  5 mg Oral q1800 Lennette Bihari, MD   5 mg at 07/24/12 1753  .  Warfarin - Pharmacist Dosing Inpatient   Does not apply [id number] Eduard Clos, MD      . zolpidem [facility]) tablet 5 mg  5 mg Oral QHS PRN Abelino Derrick, PA   5 mg at 07/21/12 2116  . [DISCONTINUED] insulin aspart protamine-insulin aspart (NOVOLOG 70/30) injection 12 Units  12 Units Subcutaneous BID WC Lennette Bihari, MD   12 Units at 07/24/12 [phone]  . [DISCONTINUED] sodium chloride 0.9 % injection 3 mL  3 mL Intravenous Q12H Eduard Clos, MD   3 mL at 07/24/12 1018    PE: General appearance: alert, cooperative and no distress Lungs: clear to auscultation bilaterally Heart: regular rate and rhythm, S1, S2 normal, no murmur, click, rub or gallop Abdomen: +BS, Nontender Extremities: Trace LEE Pulses: 2+ and symmetric 1+ DP pulses Skin: Ecchymosis in right foot appears to be getting better.  Large blister on top of her foot. Neurologic: Grossly normal  Lab Results:  No results found for this basename: WBC:3,HGB:3,HCT:3,PLT:3 in the last 72 hours BMET  [facility] 07/25/12 0630 07/24/12 0944 07/24/12 0454    NA 133* 133* 132*  K 4.7 4.8 5.2*  CL 101 100 100  CO2 22 24 25   GLUCOSE 140* 111* 146*  BUN 31* 36* 39*  CREATININE 1.44* 1.53* 1.51*  CALCIUM 11.8* 12.3* 12.3*   PT/INR  Basename 07/25/12 0630  LABPROT 28.8*  INR 2.90*   Cholesterol No results found for this basename: CHOL in the last 72 hours Cardiac Enzymes No components found with this basename: TROPONIN:3, CKMB:3  Studies/Results: @RISRSLT2 @   Assessment/Plan   Principal Problem:  *Acute systolic CHF, just discharged 86/57/84 Active Problems:  LBBB (left bundle branch block)  DVT Jun 21 2012  DM, Type 2 IDDM   Renal insufficiency, she has been NL at times but now appears to have stage III Chronic RI (moderate)  Ischemic cardiomyopathy, EF 25% Oct 2013- improved to 30% by 07/20/12 echo  Popliteal artery thrombosis, 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 She needs CABG  Chronic anticoagulation, discharged on Coumadin 07/09/12  PAF, on admission 07/12/12  Anemia  Hyperkalemia, not on ACE or ARB.  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  Slight decrease in Ca2+ and SCr.  Stable.  OP FU with Dr. Oliver Hum.   NSR on tele.  BP and HR stable.  Life Vest fit yesterday.  I discussed wearing the vest all the time. She stated that she was going to reread the instructions before she put it on.  I told her to put it on then read the instructions!  She agreed.  I also discussed daily weights and no salt intake.  DC home today.   LOS: 14 days    HAGER, BRYAN 07/25/2012 8:28 AM   Patient seen and examined. Agree with assessment and plan. Feels better, anxious for DC today. Will ultimately need CABG; she has seen Dr. Morton Peters who recommended that this be deferred until new year allowing further time for resolution  of DVT issues.   Lennette Bihari, MD, [facility] 07/25/2012 10:54 AM

## 2012-07-25 NOTE — Progress Notes (Signed)
[id number] Cardiac Rehab Pt declined to ambulate, "I just want to go home." Reviewed CHF symptoms, daily weights and proper use of sl NTG. Review with pt when to call MD and when to call 911. She voices understanding. Pt continues to request a rollater for home use. Would also recommend home health services but I am unsure if pt would accept this. She has a lot of her own ideas and does not agree with MD often.

## 2012-10-13 ENCOUNTER — Emergency Department (HOSPITAL_COMMUNITY): Payer: Medicare Other

## 2012-10-13 ENCOUNTER — Encounter (HOSPITAL_COMMUNITY): Payer: Self-pay | Admitting: Emergency Medicine

## 2012-10-13 ENCOUNTER — Inpatient Hospital Stay (HOSPITAL_COMMUNITY)
Admission: EM | Admit: 2012-10-13 | Discharge: 2012-10-20 | DRG: 239 | Disposition: A | Discharge status: Home Health | Payer: Medicare Other | Attending: Internal Medicine | Admitting: Internal Medicine

## 2012-10-13 DIAGNOSIS — Z91199 Patient's noncompliance with other medical treatment and regimen due to unspecified reason: Secondary | ICD-10-CM

## 2012-10-13 DIAGNOSIS — I255 Ischemic cardiomyopathy: Secondary | ICD-10-CM | POA: Diagnosis present

## 2012-10-13 DIAGNOSIS — J189 Pneumonia, unspecified organism: Secondary | ICD-10-CM | POA: Diagnosis present

## 2012-10-13 DIAGNOSIS — I129 Hypertensive chronic kidney disease with stage 1 through stage 4 chronic kidney disease, or unspecified chronic kidney disease: Secondary | ICD-10-CM | POA: Diagnosis present

## 2012-10-13 DIAGNOSIS — I447 Left bundle-branch block, unspecified: Secondary | ICD-10-CM | POA: Diagnosis present

## 2012-10-13 DIAGNOSIS — I739 Peripheral vascular disease, unspecified: Secondary | ICD-10-CM | POA: Diagnosis present

## 2012-10-13 DIAGNOSIS — Z79899 Other long term (current) drug therapy: Secondary | ICD-10-CM

## 2012-10-13 DIAGNOSIS — I509 Heart failure, unspecified: Secondary | ICD-10-CM

## 2012-10-13 DIAGNOSIS — Z794 Long term (current) use of insulin: Secondary | ICD-10-CM

## 2012-10-13 DIAGNOSIS — N039 Chronic nephritic syndrome with unspecified morphologic changes: Secondary | ICD-10-CM | POA: Diagnosis present

## 2012-10-13 DIAGNOSIS — D649 Anemia, unspecified: Secondary | ICD-10-CM

## 2012-10-13 DIAGNOSIS — L02619 Cutaneous abscess of unspecified foot: Secondary | ICD-10-CM | POA: Diagnosis present

## 2012-10-13 DIAGNOSIS — Z86718 Personal history of other venous thrombosis and embolism: Secondary | ICD-10-CM

## 2012-10-13 DIAGNOSIS — I70269 Atherosclerosis of native arteries of extremities with gangrene, unspecified extremity: Secondary | ICD-10-CM | POA: Diagnosis present

## 2012-10-13 DIAGNOSIS — I96 Gangrene, not elsewhere classified: Secondary | ICD-10-CM | POA: Diagnosis present

## 2012-10-13 DIAGNOSIS — N183 Chronic kidney disease, stage 3 unspecified: Secondary | ICD-10-CM | POA: Diagnosis present

## 2012-10-13 DIAGNOSIS — R0902 Hypoxemia: Secondary | ICD-10-CM

## 2012-10-13 DIAGNOSIS — I743 Embolism and thrombosis of arteries of the lower extremities: Secondary | ICD-10-CM | POA: Diagnosis present

## 2012-10-13 DIAGNOSIS — D638 Anemia in other chronic diseases classified elsewhere: Secondary | ICD-10-CM | POA: Diagnosis present

## 2012-10-13 DIAGNOSIS — E1152 Type 2 diabetes mellitus with diabetic peripheral angiopathy with gangrene: Secondary | ICD-10-CM

## 2012-10-13 DIAGNOSIS — E876 Hypokalemia: Secondary | ICD-10-CM | POA: Diagnosis not present

## 2012-10-13 DIAGNOSIS — E119 Type 2 diabetes mellitus without complications: Secondary | ICD-10-CM

## 2012-10-13 DIAGNOSIS — I480 Paroxysmal atrial fibrillation: Secondary | ICD-10-CM | POA: Diagnosis present

## 2012-10-13 DIAGNOSIS — Z87891 Personal history of nicotine dependence: Secondary | ICD-10-CM

## 2012-10-13 DIAGNOSIS — D631 Anemia in chronic kidney disease: Secondary | ICD-10-CM | POA: Diagnosis present

## 2012-10-13 DIAGNOSIS — L0291 Cutaneous abscess, unspecified: Secondary | ICD-10-CM

## 2012-10-13 DIAGNOSIS — I959 Hypotension, unspecified: Secondary | ICD-10-CM | POA: Diagnosis not present

## 2012-10-13 DIAGNOSIS — Z9119 Patient's noncompliance with other medical treatment and regimen: Secondary | ICD-10-CM

## 2012-10-13 DIAGNOSIS — I2589 Other forms of chronic ischemic heart disease: Secondary | ICD-10-CM | POA: Diagnosis present

## 2012-10-13 DIAGNOSIS — Z7901 Long term (current) use of anticoagulants: Secondary | ICD-10-CM

## 2012-10-13 DIAGNOSIS — L0390 Cellulitis, unspecified: Secondary | ICD-10-CM

## 2012-10-13 DIAGNOSIS — Z6828 Body mass index (BMI) 28.0-28.9, adult: Secondary | ICD-10-CM

## 2012-10-13 DIAGNOSIS — I82409 Acute embolism and thrombosis of unspecified deep veins of unspecified lower extremity: Secondary | ICD-10-CM | POA: Diagnosis present

## 2012-10-13 DIAGNOSIS — I5042 Chronic combined systolic (congestive) and diastolic (congestive) heart failure: Secondary | ICD-10-CM

## 2012-10-13 DIAGNOSIS — Z880 Allergy status to penicillin: Secondary | ICD-10-CM

## 2012-10-13 DIAGNOSIS — I2510 Atherosclerotic heart disease of native coronary artery without angina pectoris: Secondary | ICD-10-CM

## 2012-10-13 DIAGNOSIS — E669 Obesity, unspecified: Secondary | ICD-10-CM | POA: Diagnosis present

## 2012-10-13 DIAGNOSIS — E1159 Type 2 diabetes mellitus with other circulatory complications: Principal | ICD-10-CM | POA: Diagnosis present

## 2012-10-13 LAB — CBC WITH DIFFERENTIAL/PLATELET
Basophils Absolute: 0 10*3/uL (ref 0.0–0.1)
Basophils Relative: 1 % (ref 0–1)
Eosinophils Absolute: 0.2 10*3/uL (ref 0.0–0.7)
Lymphs Abs: 1.5 10*3/uL (ref 0.7–4.0)
MCH: 25 pg — ABNORMAL LOW (ref 26.0–34.0)
MCHC: 30.6 g/dL (ref 30.0–36.0)
Neutrophils Relative %: 66 % (ref 43–77)
Platelets: 267 10*3/uL (ref 150–400)
RBC: 3.88 MIL/uL (ref 3.87–5.11)
RDW: 18.7 % — ABNORMAL HIGH (ref 11.5–15.5)

## 2012-10-13 LAB — BASIC METABOLIC PANEL
Calcium: 11.4 mg/dL — ABNORMAL HIGH (ref 8.4–10.5)
GFR calc non Af Amer: 57 mL/min — ABNORMAL LOW (ref >90)
Glucose, Bld: 247 mg/dL — ABNORMAL HIGH (ref 70–99)
Sodium: 135 mEq/L (ref 135–145)

## 2012-10-13 LAB — GLUCOSE, CAPILLARY
Glucose-Capillary: 262 mg/dL — ABNORMAL HIGH (ref 70–99)
Glucose-Capillary: 222 mg/dL — ABNORMAL HIGH (ref 70–99)
Glucose-Capillary: 118 mg/dL — ABNORMAL HIGH (ref 70–99)
Glucose-Capillary: 144 mg/dL — ABNORMAL HIGH (ref 70–99)

## 2012-10-13 LAB — ABO/RH: ABO/RH(D): O POS

## 2012-10-13 LAB — PROTIME-INR: INR: 1.33 (ref 0.00–1.49)

## 2012-10-13 MED ORDER — VANCOMYCIN HCL IN DEXTROSE 1-5 GM/200ML-% IV SOLN
1000.0000 mg | Freq: Once | INTRAVENOUS | Status: AC
  Administered 2012-10-13: 1000 mg via INTRAVENOUS
  Filled 2012-10-13: qty 200

## 2012-10-13 MED ORDER — METFORMIN HCL 500 MG PO TABS
500.0000 mg | ORAL_TABLET | Freq: Two times a day (BID) | ORAL | Status: DC
  Filled 2012-10-13 (×3): qty 1

## 2012-10-13 MED ORDER — LEVOFLOXACIN IN D5W 750 MG/150ML IV SOLN
750.0000 mg | INTRAVENOUS | Status: DC
  Administered 2012-10-13 – 2012-10-16 (×4): 750 mg via INTRAVENOUS
  Filled 2012-10-13 (×6): qty 150

## 2012-10-13 MED ORDER — LORAZEPAM 0.5 MG PO TABS
0.2500 mg | ORAL_TABLET | Freq: Two times a day (BID) | ORAL | Status: DC | PRN
  Administered 2012-10-13 – 2012-10-18 (×6): 0.25 mg via ORAL
  Filled 2012-10-13 (×6): qty 1

## 2012-10-13 MED ORDER — INSULIN ASPART 100 UNIT/ML ~~LOC~~ SOLN: 0.0000 [IU] | Freq: Every day | SUBCUTANEOUS | Status: DC

## 2012-10-13 MED ORDER — INSULIN ASPART 100 UNIT/ML ~~LOC~~ SOLN
0.0000 [IU] | Freq: Three times a day (TID) | SUBCUTANEOUS | Status: DC
  Administered 2012-10-13: 5 [IU] via SUBCUTANEOUS
  Administered 2012-10-13 – 2012-10-18 (×12): 2 [IU] via SUBCUTANEOUS
  Administered 2012-10-19: 3 [IU] via SUBCUTANEOUS
  Administered 2012-10-19: 2 [IU] via SUBCUTANEOUS
  Administered 2012-10-20 (×2): 3 [IU] via SUBCUTANEOUS

## 2012-10-13 MED ORDER — VANCOMYCIN HCL 10 G IV SOLR: 1500.0000 mg | Freq: Every day | INTRAVENOUS | Status: DC

## 2012-10-13 MED ORDER — ASPIRIN EC 81 MG PO TBEC
81.0000 mg | DELAYED_RELEASE_TABLET | Freq: Every day | ORAL | Status: DC
  Administered 2012-10-13 – 2012-10-20 (×7): 81 mg via ORAL
  Filled 2012-10-13 (×8): qty 1

## 2012-10-13 MED ORDER — ACETAMINOPHEN 325 MG PO TABS
650.0000 mg | ORAL_TABLET | ORAL | Status: DC | PRN
  Administered 2012-10-14: 650 mg via ORAL
  Filled 2012-10-13 (×2): qty 2

## 2012-10-13 MED ORDER — ONDANSETRON HCL 4 MG PO TABS: 4.0000 mg | ORAL_TABLET | Freq: Four times a day (QID) | ORAL | Status: DC | PRN

## 2012-10-13 MED ORDER — FUROSEMIDE 40 MG PO TABS
40.0000 mg | ORAL_TABLET | Freq: Every day | ORAL | Status: DC
  Administered 2012-10-13 – 2012-10-15 (×3): 40 mg via ORAL
  Filled 2012-10-13 (×3): qty 1

## 2012-10-13 MED ORDER — ATORVASTATIN CALCIUM 10 MG PO TABS
10.0000 mg | ORAL_TABLET | Freq: Every day | ORAL | Status: DC
  Administered 2012-10-13 – 2012-10-19 (×7): 10 mg via ORAL
  Filled 2012-10-13 (×8): qty 1

## 2012-10-13 MED ORDER — SPIRONOLACTONE 12.5 MG HALF TABLET
12.5000 mg | ORAL_TABLET | Freq: Every day | ORAL | Status: DC
  Administered 2012-10-13 – 2012-10-14 (×2): 12.5 mg via ORAL
  Filled 2012-10-13 (×4): qty 1

## 2012-10-13 MED ORDER — CARVEDILOL 6.25 MG PO TABS
6.2500 mg | ORAL_TABLET | Freq: Two times a day (BID) | ORAL | Status: DC
  Administered 2012-10-13 – 2012-10-15 (×5): 6.25 mg via ORAL
  Filled 2012-10-13 (×9): qty 1

## 2012-10-13 MED ORDER — BIOTENE DRY MOUTH MT LIQD
15.0000 mL | Freq: Two times a day (BID) | OROMUCOSAL | Status: DC
  Administered 2012-10-13 – 2012-10-15 (×5): 15 mL via OROMUCOSAL

## 2012-10-13 MED ORDER — INSULIN ASPART 100 UNIT/ML ~~LOC~~ SOLN: 0.0000 [IU] | Freq: Three times a day (TID) | SUBCUTANEOUS | Status: DC

## 2012-10-13 MED ORDER — ISOSORBIDE MONONITRATE ER 30 MG PO TB24
30.0000 mg | ORAL_TABLET | Freq: Every day | ORAL | Status: DC
  Administered 2012-10-13 – 2012-10-14 (×2): 30 mg via ORAL
  Filled 2012-10-13 (×4): qty 1

## 2012-10-13 MED ORDER — ONDANSETRON HCL 4 MG/2ML IJ SOLN
4.0000 mg | Freq: Four times a day (QID) | INTRAMUSCULAR | Status: DC | PRN
  Administered 2012-10-13: 4 mg via INTRAVENOUS
  Administered 2012-10-14 (×2): 2 mg via INTRAVENOUS
  Administered 2012-10-16 – 2012-10-20 (×3): 4 mg via INTRAVENOUS
  Filled 2012-10-13 (×6): qty 2

## 2012-10-13 MED ORDER — MORPHINE SULFATE 2 MG/ML IJ SOLN
2.0000 mg | INTRAMUSCULAR | Status: DC | PRN
  Administered 2012-10-13 – 2012-10-16 (×2): 2 mg via INTRAVENOUS
  Filled 2012-10-13 (×3): qty 1

## 2012-10-13 MED ORDER — FENTANYL CITRATE 0.05 MG/ML IJ SOLN
25.0000 ug | INTRAMUSCULAR | Status: DC | PRN
  Administered 2012-10-13: 25 ug via INTRAVENOUS
  Filled 2012-10-13: qty 2

## 2012-10-13 MED ORDER — SODIUM CHLORIDE 0.9 % IV SOLN
1000.0000 mL | INTRAVENOUS | Status: DC
  Administered 2012-10-13 – 2012-10-14 (×3): 1000 mL via INTRAVENOUS

## 2012-10-13 MED ORDER — VANCOMYCIN HCL IN DEXTROSE 1-5 GM/200ML-% IV SOLN
1000.0000 mg | Freq: Two times a day (BID) | INTRAVENOUS | Status: DC
  Administered 2012-10-13 – 2012-10-17 (×8): 1000 mg via INTRAVENOUS
  Filled 2012-10-13 (×9): qty 200

## 2012-10-13 MED ORDER — FAMOTIDINE 20 MG PO TABS
20.0000 mg | ORAL_TABLET | Freq: Every day | ORAL | Status: DC
  Administered 2012-10-13 – 2012-10-15 (×3): 20 mg via ORAL
  Filled 2012-10-13 (×4): qty 1

## 2012-10-13 MED ORDER — HEPARIN SODIUM (PORCINE) 5000 UNIT/ML IJ SOLN
5000.0000 [IU] | Freq: Three times a day (TID) | INTRAMUSCULAR | Status: AC
  Administered 2012-10-13 – 2012-10-15 (×9): 5000 [IU] via SUBCUTANEOUS
  Filled 2012-10-13 (×10): qty 1

## 2012-10-13 MED ORDER — CIPROFLOXACIN IN D5W 400 MG/200ML IV SOLN
400.0000 mg | Freq: Two times a day (BID) | INTRAVENOUS | Status: DC
  Administered 2012-10-13: 400 mg via INTRAVENOUS
  Filled 2012-10-13 (×2): qty 200

## 2012-10-13 MED ORDER — OXYCODONE-ACETAMINOPHEN 5-325 MG PO TABS
1.0000 | ORAL_TABLET | ORAL | Status: DC | PRN
  Administered 2012-10-13 – 2012-10-18 (×4): 2 via ORAL
  Administered 2012-10-18: 1 via ORAL
  Administered 2012-10-19: 2 via ORAL
  Filled 2012-10-13 (×4): qty 2
  Filled 2012-10-13: qty 1
  Filled 2012-10-13: qty 2

## 2012-10-13 NOTE — ED Notes (Signed)
Brought in by EMS from home with c/o "unable to stand the whole day" and c/o left hip pain.  Per EMS:  Pt had a fall last Tuesday night--- slid off commode; pt reports that she has been having left hip pain and left rib cage pain since her fall, states unable to stand and get out of her chair for the whole day (Wednesday); pt arrived to ED room A/Ox4.

## 2012-10-13 NOTE — ED Notes (Signed)
Lab was called for add-on lab: Troponin I

## 2012-10-13 NOTE — H&P (Signed)
[facility] History and Physical  [name] [name]:[id number] DOB: 12-06-1945    PCP:  None  Chief Complaint: general malaise and increase redness on the right foot.  HPI: [name] [name] is an 67 y.o. female with ischemic dilated CMP EF 25%, multivessel CAD needing CABG, DM2 uncontrolled, with ischemic right foot and cellulitis being treated, presents to ER [facility] complaining of feeling malaise and right foot cellulitis.  Evaluation in the ER showed CXR with vascular congestion, with atelectasis vs infiltrate, normal WBC and Hb of 9.7 gr/DL, Calcium of 81.1, with normal renal fx tests.  Hospitalist was asked to admit her for cellulitis.  Rewiew of Systems:  Constitutional: Negative for leg pain. No significant weight loss or weight gain Eyes: Negative for eye pain, redness and discharge, diplopia, visual changes, or flashes of light. ENMT: Negative for ear pain, hoarseness, nasal congestion, sinus pressure and sore throat. No headaches; tinnitus, drooling, or problem swallowing. Cardiovascular: Negative for chest pain, palpitations, diaphoresis, dyspnea and peripheral edema. ; No orthopnea, PND Respiratory: Negative for cough, hemoptysis, wheezing and stridor. No pleuritic chestpain. Gastrointestinal: Negative for nausea, vomiting, diarrhea, constipation, abdominal pain, melena, blood in stool, hematemesis, jaundice and rectal bleeding.    Genitourinary: Negative for frequency, dysuria, incontinence,flank pain and hematuria; Musculoskeletal: Negative for back pain and neck pain. Negative for swelling and trauma.;  Skin: . Negative for pruritus, rash, abrasions, bruising.  She has erythema over her right foot with gangrenous changes. Neuro: Negative for headache, lightheadedness and neck stiffness. Negative for weakness, altered level of consciousness , altered mental status, extremity weakness, burning feet, involuntary movement, seizure and syncope.  Psych: negative for anxiety,  depression, insomnia, tearfulness, panic attacks, hallucinations, paranoia, suicidal or homicidal ideation    Past Medical History  Diagnosis Date  . 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 Diabetes mellitus type 2, uncontrolled, with complications     Very poorly controlled  . Coronary artery disease     Severe Multivessel Disase  . DVT, bilateral lower limbs 05/2012  . 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 Vaginal hysterectomy  ~ 1980  . Cesarean section  1979  . Lumbar disc surgery  1980's  . Facial reconstruction surgery    . Cardiac catheterization      Medications:  HOME MEDS: Prior to Admission medications   Medication Sig Start Date End Date Taking? Authorizing Provider  acetaminophen (TYLENOL) 325 MG tablet Take 2 tablets (650 mg total) by mouth every 4 (four) hours as needed. 07/09/12  Yes Abelino Derrick, PA  aspirin EC 81 MG EC tablet Take 1 tablet (81 mg total) by mouth daily. 07/09/12  Yes Abelino Derrick, PA  atorvastatin (LIPITOR) 10 MG tablet Take 1 tablet (10 mg total) by mouth daily at 6 PM. 07/09/12  Yes Abelino Derrick, PA  furosemide (LASIX) 40 MG tablet Take 1 tablet (40 mg total) by mouth daily. 07/09/12  Yes Abelino Derrick, PA  isosorbide mononitrate (IMDUR) 30 MG 24 hr tablet Take 1 tablet (30 mg total) by mouth daily. 07/25/12  Yes Wilburt Finlay, PA  LORazepam (ATIVAN) 0.5 MG tablet Take 0.5 tablets (0.25 mg total) by mouth every 12 (twelve) hours as needed for anxiety. 07/09/12  Yes Abelino Derrick, PA  metFORMIN (GLUCOPHAGE) 500 MG tablet Take 1 tablet (500 mg total) by mouth 2 (two) times daily with a meal. 07/09/12  Yes Abelino Derrick,  PA  ranitidine (ZANTAC) 150 MG tablet Take 150 mg by mouth 2 (two) times daily.   Yes Historical Provider, MD  warfarin (COUMADIN) 5 MG tablet Take 1 tablet (5 mg total) by mouth daily. 07/09/12 07/09/16 Yes Abelino Derrick, PA  carvedilol (COREG) 6.25 MG  tablet Take 1 tablet (6.25 mg total) by mouth 2 (two) times daily with a meal. 07/25/12   Wilburt Finlay, PA  insulin aspart (NOVOLOG) 100 UNIT/ML injection Inject 0-9 Units into the skin 3 (three) times daily with meals. 07/25/12   Wilburt Finlay, PA  spironolactone (ALDACTONE) 12.5 mg TABS Take 0.5 tablets (12.5 mg total) by mouth daily. 07/09/12   Abelino Derrick, PA     Allergies:  Allergies  Allergen Reactions  . Penicillins Anaphylaxis  . Sulfa Antibiotics Anaphylaxis    Social History:   reports that she has quit smoking. Her smoking use included Cigarettes. She has a 10 pack-year smoking history. She has never used smokeless tobacco. She reports that she does not drink alcohol or use illicit drugs.  Family History: Family History  Problem Relation Age of Onset  . Cancer Mother   . CAD Mother   . Cancer Father   . CAD Father      Physical Exam: Filed Vitals:   10/13/12 0109 10/13/12 0112  Pulse: 88   Temp: 98 F (36.7 C)   TempSrc: Oral   Resp: 18   SpO2: 87% 93%   Pulse 88, temperature 98 F (36.7 C), temperature source Oral, resp. rate 18, SpO2 93.00%.  GEN:  Pleasant  patient lying in the stretcher in no acute distress; cooperative with exam. PSYCH:  alert and oriented x4; does not appear anxious or depressed; affect is appropriate. HEENT: Mucous membranes pink and anicteric; PERRLA; EOM intact; no cervical lymphadenopathy nor thyromegaly or carotid bruit; no JVD; There were no stridor. Neck is very supple. Breasts:: Not examined CHEST WALL: No tenderness CHEST: Normal respiration, clear to auscultation bilaterally.  HEART: Regular rate and rhythm.  There are no murmur, rub, or gallops.   BACK: No kyphosis or scoliosis; no CVA tenderness ABDOMEN: soft and non-tender; no masses, no organomegaly, normal abdominal bowel sounds; no pannus; no intertriginous candida. There is no rebound and no distention. Rectal Exam: Not done EXTREMITIES: No bone or joint deformity;  age-appropriate arthropathy of the hands and knees; She has large ulcer on her right foot with gangrenous changes and sorrounding cellulitis. Genitalia: not examined PULSES: 2+ and symmetric SKIN: Normal hydration no rash or ulceration CNS: Cranial nerves 2-12 grossly intact no focal lateralizing neurologic deficit.  Speech is fluent; uvula elevated with phonation, facial symmetry and tongue midline. DTR are normal bilaterally, cerebella exam is intact, barbinski is negative and strengths are equaled bilaterally.  No sensory loss.   Labs on Admission:  Basic Metabolic Panel:  Recent Labs Lab 10/13/12 0150  NA 135  K 5.1  CL 106  CO2 23  GLUCOSE 247*  BUN 24*  CREATININE 1.00  CALCIUM 11.4*   Liver Function Tests: No results found for this basename: AST, ALT, ALKPHOS, BILITOT, PROT, ALBUMIN,  in the last 168 hours No results found for this basename: LIPASE, AMYLASE,  in the last 168 hours No results found for this basename: AMMONIA,  in the last 168 hours CBC:  Recent Labs Lab 10/13/12 0150  WBC 6.8  NEUTROABS 4.5  HGB 9.7*  HCT 31.7*  MCV 81.7  PLT 267   Cardiac Enzymes:  Recent Labs Lab 10/13/12  0150  TROPONINI <0.30    CBG: No results found for this basename: GLUCAP,  in the last 168 hours   Radiological Exams on Admission: Dg Chest 1 View  10/13/2012  *RADIOLOGY REPORT*  Clinical Data: Fall, back pain  CHEST - 1 VIEW  Comparison: 07/18/2012  Findings: Heart size upper normal.  Central vascular congestion. Increased perihilar markings.  Small right pleural effusion.  Mild bibasilar opacities.  No pneumothorax.  No acute osseous finding.  IMPRESSION: Cardiomegaly with central vascular congestion.  Increased interstitial markings may reflect atypical infection or edema.  Small right pleural effusion.  Bibasilar opacities; atelectasis versus infiltrate.   Original Report Authenticated By: Jearld Lesch, M.D.    Dg Thoracic Spine 2 View  10/13/2012  *RADIOLOGY  REPORT*  Clinical Data: Fall, back pain  THORACIC SPINE - 2 VIEW  Comparison: 07/08/2012  Findings: Limited by cross-table lateral technique.  Diffuse osteopenia.  Mild degenerative changes.  No displaced fracture identified. Alignment maintained.  IMPRESSION: No displaced fracture identified. If clinical concern for fracture persists, recommend cross-sectional imaging through the focal level of concern.  Mild multilevel degenerative changes.   Original Report Authenticated By: Jearld Lesch, M.D.    Dg Hip Complete Left  10/13/2012  *RADIOLOGY REPORT*  Clinical Data: Left hip pain  LEFT HIP - COMPLETE 2+ VIEW  Comparison: None.  Findings: Diffuse osteopenia.  No displaced fracture or dislocation.  No aggressive osseous lesions.  IMPRESSION: No displaced left hip fracture.  Osteopenia.  MRI recommended if clinical concern for nondisplaced fracture persists.   Original Report Authenticated By: Jearld Lesch, M.D.    Dg Foot Complete Right  10/13/2012  *RADIOLOGY REPORT*  Clinical Data: Diabetic, painful toes.  RIGHT FOOT COMPLETE - 3+ VIEW  Comparison: None.  Findings: Diffuse osteopenia.  Periosteal reaction along the medial margin of the second metatarsal. Irregularity/defect at the tip of the distal phalanx second digit.  No acute fracture or dislocation. No radiopaque foreign body.  The dorsal soft tissue swelling.  IMPRESSION: Periosteal reaction along the second metatarsal and deformity/defect at the tip of the second distal phalanx.  Osteomyelitis not excluded. MRI has increased sensitivity.   Original Report Authenticated By: Jearld Lesch, M.D.     Assessment/Plan Present on Admission:  . Ischemic cardiomyopathy, EF 25% Oct 2013- improved to 30% by 07/20/12 echo . DM, Type 2 IDDM . CAD, 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 She needs CABG . PVD (peripheral vascular disease) . 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  These will likely need to be amputated.  She doesn't believe they are dead, and expresses that she doesn't want to have them amputated.  Please consult ortho to see whether or not she will consent to this life saving procedure.  I will hold off her coumadin and made her NPO in case she will be amenable to it.  Her Calcium is elevated, and will obtain a PTH.  For her DM, will continue with SSI.  I will start her on Vanco and Cipro (She has anaphylaxis with any PCN products)  She is stable at this time, full code, and will be admitted to [facility] service.  Other plans as per orders.  Code Status: FULL Unk Lightning, MD. [facility] Pager [phone] 7pm to 7am.  10/13/2012, 5:32 AM

## 2012-10-13 NOTE — ED Notes (Signed)
Noted a gangrenous wound to pt's right foot and toes when socks were removed---dorsal wound to foot and toes black and malodorous, no active drainage noted; pt denies pain to affected area, peri-wound red and swollen.

## 2012-10-13 NOTE — ED Provider Notes (Signed)
History     CSN: [id number]  Arrival date & time 10/13/12  0054   First MD Initiated Contact with Patient 10/13/12 0144      Chief Complaint  Patient presents with  . Fall  . Hip Pain    (Consider location/radiation/quality/duration/timing/severity/associated sxs/prior treatment) HPI Hx per PT. Generalized weakness worse over the last few days. Uses a walker but now to the point she cant get off of the camode.  Last night she fell and hurt her L hip and left side/ back.  Since then unable to ambulate due to pain and generalized weakness. She is diabetic with ongoing necrotic R foot. No F/C. No cough or SOB. No CP. No ABD pain. Pain sharp, not radiating and mod in severity   Past Medical History  Diagnosis Date  . 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 Diabetes mellitus type 2, uncontrolled, with complications     Very poorly controlled  . Coronary artery disease     Severe Multivessel Disase  . DVT, bilateral lower limbs 05/2012  . 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 Vaginal hysterectomy  ~ 1980  . Cesarean section  1979  . Lumbar disc surgery  1980's  . Facial reconstruction surgery    . Cardiac catheterization      Family History  Problem Relation Age of Onset  . Cancer Mother   . CAD Mother   . Cancer Father   . CAD Father     History  Substance Use Topics  . Smoking status: Former Smoker -- 0.50 packs/day for 20 years    Types: Cigarettes  . Smokeless tobacco: Never Used     Comment: 07/06/12 "stopped smoking cigarettes years ago"  . Alcohol Use: No    OB History   Grav Para Term Preterm Abortions TAB SAB Ect Mult Living                  Review of Systems  Constitutional: Negative for fever and chills.  HENT: Negative for neck pain and neck stiffness.   Eyes: Negative for visual disturbance.  Respiratory: Negative for shortness of breath.   Cardiovascular: Positive for  chest pain.  Gastrointestinal: Negative for vomiting, abdominal pain and abdominal distention.  Genitourinary: Negative for flank pain.  Musculoskeletal: Positive for back pain.  Skin: Negative for rash.  Neurological: Negative for headaches.  All other systems reviewed and are negative.    Allergies  Penicillins and Sulfa antibiotics  Home Medications   Current Outpatient Rx  Name  Route  Sig  Dispense  Refill  . acetaminophen (TYLENOL) 325 MG tablet   Oral   Take 2 tablets (650 mg total) by mouth every 4 (four) hours as needed.         Marland Kitchen aspirin EC 81 MG EC tablet   Oral   Take 1 tablet (81 mg total) by mouth daily.         Marland Kitchen atorvastatin (LIPITOR) 10 MG tablet   Oral   Take 1 tablet (10 mg total) by mouth daily at 6 PM.   30 tablet   5   . furosemide (LASIX) 40 MG tablet   Oral   Take 1 tablet (40 mg total) by mouth daily.   30 tablet   5   . isosorbide mononitrate (IMDUR) 30 MG 24 hr tablet   Oral   Take 1 tablet (30 mg total) by mouth  daily.   30 tablet   5   . LORazepam (ATIVAN) 0.5 MG tablet   Oral   Take 0.5 tablets (0.25 mg total) by mouth every 12 (twelve) hours as needed for anxiety.   30 tablet   0   . metFORMIN (GLUCOPHAGE) 500 MG tablet   Oral   Take 1 tablet (500 mg total) by mouth 2 (two) times daily with a meal.   60 tablet   5   . ranitidine (ZANTAC) 150 MG tablet   Oral   Take 150 mg by mouth 2 (two) times daily.         Marland Kitchen warfarin (COUMADIN) 5 MG tablet   Oral   Take 1 tablet (5 mg total) by mouth daily.   30 tablet   11   . carvedilol (COREG) 6.25 MG tablet   Oral   Take 1 tablet (6.25 mg total) by mouth 2 (two) times daily with a meal.   60 tablet   5   . insulin aspart (NOVOLOG) 100 UNIT/ML injection   Subcutaneous   Inject 0-9 Units into the skin 3 (three) times daily with meals.   1 vial   10   . spironolactone (ALDACTONE) 12.5 mg TABS   Oral   Take 0.5 tablets (12.5 mg total) by mouth daily.   30 tablet    5     Pulse 88  Temp(Src) 98 F (36.7 C) (Oral)  Resp 18  SpO2 93%  Physical Exam  Constitutional: She is oriented to person, place, and time. She appears well-developed and well-nourished.  HENT:  Head: Normocephalic and atraumatic.  Mouth/Throat: Oropharynx is clear and moist. No oropharyngeal exudate.  Eyes: EOM are normal. Pupils are equal, round, and reactive to light.  Neck: Normal range of motion. Neck supple.  No cervical tenderness  Cardiovascular: Normal rate, regular rhythm and intact distal pulses.   Pulmonary/Chest: Effort normal and breath sounds normal. No respiratory distress. She exhibits no tenderness.  Abdominal: Soft. Bowel sounds are normal. She exhibits no distension. There is no tenderness. There is no rebound and no guarding.  Musculoskeletal:  TTP lower T spine no deformity. No L-S TTP or deformity. TTP over L greater trochanter, no LE deformity with distal N/V intact. Necrosis to distal R foot involving 1st, 2nd, 3rd distal digits. Mild surrounding erythema.   Neurological: She is alert and oriented to person, place, and time.  Skin: Skin is warm and dry.    ED Course  Procedures (including critical care time)  Results for orders placed during the hospital encounter of 10/13/12  BASIC METABOLIC PANEL      Result Value Range   Sodium 135  135 - 145 mEq/L   Potassium 5.1  3.5 - 5.1 mEq/L   Chloride 106  96 - 112 mEq/L   CO2 23  19 - 32 mEq/L   Glucose, Bld 247 (*) 70 - 99 mg/dL   BUN 24 (*) 6 - 23 mg/dL   Creatinine, Ser 4.09  0.50 - 1.10 mg/dL   Calcium 81.1 (*) 8.4 - 10.5 mg/dL   GFR calc non Af Amer 57 (*) >90 mL/min   GFR calc Af Amer 67 (*) >90 mL/min  CBC WITH DIFFERENTIAL      Result Value Range   WBC 6.8  4.0 - 10.5 K/uL   RBC 3.88  3.87 - 5.11 MIL/uL   Hemoglobin 9.7 (*) 12.0 - 15.0 g/dL   HCT 91.4 (*) 78.2 - 95.6 %  MCV 81.7  78.0 - 100.0 fL   MCH 25.0 (*) 26.0 - 34.0 pg   MCHC 30.6  30.0 - 36.0 g/dL   RDW 96.0 (*) 45.4 - 09.8 %    Platelets 267  150 - 400 K/uL   Neutrophils Relative 66  43 - 77 %   Neutro Abs 4.5  1.7 - 7.7 K/uL   Lymphocytes Relative 22  12 - 46 %   Lymphs Abs 1.5  0.7 - 4.0 K/uL   Monocytes Relative 9  3 - 12 %   Monocytes Absolute 0.6  0.1 - 1.0 K/uL   Eosinophils Relative 2  0 - 5 %   Eosinophils Absolute 0.2  0.0 - 0.7 K/uL   Basophils Relative 1  0 - 1 %   Basophils Absolute 0.0  0.0 - 0.1 K/uL  PROTIME-INR      Result Value Range   Prothrombin Time 16.2 (*) 11.6 - 15.2 seconds   INR 1.33  0.00 - 1.49  TROPONIN I      Result Value Range   Troponin I <0.30  <0.30 ng/mL  TYPE AND SCREEN      Result Value Range   ABO/RH(D) O POS     Antibody Screen NEG     Sample Expiration 10/16/2012     Dg Chest 1 View  10/13/2012  *RADIOLOGY REPORT*  Clinical Data: Fall, back pain  CHEST - 1 VIEW  Comparison: 07/18/2012  Findings: Heart size upper normal.  Central vascular congestion. Increased perihilar markings.  Small right pleural effusion.  Mild bibasilar opacities.  No pneumothorax.  No acute osseous finding.  IMPRESSION: Cardiomegaly with central vascular congestion.  Increased interstitial markings may reflect atypical infection or edema.  Small right pleural effusion.  Bibasilar opacities; atelectasis versus infiltrate.   Original Report Authenticated By: Jearld Lesch, M.D.    Dg Thoracic Spine 2 View  10/13/2012  *RADIOLOGY REPORT*  Clinical Data: Fall, back pain  THORACIC SPINE - 2 VIEW  Comparison: 07/08/2012  Findings: Limited by cross-table lateral technique.  Diffuse osteopenia.  Mild degenerative changes.  No displaced fracture identified. Alignment maintained.  IMPRESSION: No displaced fracture identified. If clinical concern for fracture persists, recommend cross-sectional imaging through the focal level of concern.  Mild multilevel degenerative changes.   Original Report Authenticated By: Jearld Lesch, M.D.    Dg Hip Complete Left  10/13/2012  *RADIOLOGY REPORT*  Clinical  Data: Left hip pain  LEFT HIP - COMPLETE 2+ VIEW  Comparison: None.  Findings: Diffuse osteopenia.  No displaced fracture or dislocation.  No aggressive osseous lesions.  IMPRESSION: No displaced left hip fracture.  Osteopenia.  MRI recommended if clinical concern for nondisplaced fracture persists.   Original Report Authenticated By: Jearld Lesch, M.D.    Dg Foot Complete Right  10/13/2012  *RADIOLOGY REPORT*  Clinical Data: Diabetic, painful toes.  RIGHT FOOT COMPLETE - 3+ VIEW  Comparison: None.  Findings: Diffuse osteopenia.  Periosteal reaction along the medial margin of the second metatarsal. Irregularity/defect at the tip of the distal phalanx second digit.  No acute fracture or dislocation. No radiopaque foreign body.  The dorsal soft tissue swelling.  IMPRESSION: Periosteal reaction along the second metatarsal and deformity/defect at the tip of the second distal phalanx.  Osteomyelitis not excluded. MRI has increased sensitivity.   Original Report Authenticated By: Jearld Lesch, M.D.      Date: 10/13/2012  Rate: 89  Rhythm: normal sinus rhythm  QRS Axis: normal  Intervals: normal  ST/T Wave abnormalities: nonspecific ST/T changes  Conduction Disutrbances:nonspecific intraventricular conduction delay  Narrative Interpretation:   Old EKG Reviewed: none available  IV fentanyl for pain. IV vancomycin for necrotic right foot with possible osteomyelitis. X-rays and labs reviewed and medicine consult for admission.  MDM   Generalized weakness with fall and severely necrotic right foot. Evaluated with imaging and labs and EKG reviewed as above. IV narcotics and antibiotics provided. Medical admission.         Sunnie Nielsen, MD 10/13/12 [phone]

## 2012-10-13 NOTE — Progress Notes (Addendum)
[facility] PROGRESS NOTE  [name] [name] EXB:[id number] DOB: September 23, 1945 DOA: 10/13/2012 PCP: No primary provider on file.  Brief narrative: 67 y.o. female with ischemic dilated CMP EF 25%, multivessel CAD needing CABG, DM2 uncontrolled presented to [facility] long ED 10/13/2012 status post fall at home. In ED, patient was found to have gangrenous right foot/cellulitis. We have offered orthopedics consult for evaluation of right foot gangrene however patient has refused orthopedic consult and only agree to antibiotic management. In addition, evaluation in ED included x-ray of the right foot which did not reveal fractures by osteomyelitis could not be excluded. Furthermore, x-ray of left hip and thoracic spine showed no displaced fracture identified. CBC done in ED revealed hemoglobin of 9.7.  Assessment/Plan:  Principal Problem:   Gangrene of foot/ osteomyelitis  Continue vancomycin for now  Patient refused orthopedic consult  We'll get physical therapy evaluation for safe discharge plan Active Problems:  Possible pneumonia  Would discontinue ciprofloxacin which was started on admission. Started Levaquin per pharmacy.   DM, Type 2 IDDM  We will hold metformin and continue sliding scale insulin in the setting of acute infection  CAD, 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 She needs CABG  Continue aspirin, atorvastatin Hypertension  Continue Corag, Imdur, Spironolactone   Code Status: Full code Family Communication: Family not at bedside Disposition Plan: Home when stable  [name], MD  [facility] Pager [phone]  If 7PM-7AM, please contact night-coverage [email or web address] Password [email or web address] 10/13/2012, 12:52 PM   LOS: 0 days   Consultants:  None  Procedures:  None  Antibiotics:  Vancomycin 10/13/2012 -->  Levaquin 10/13/2012 -->  HPI/Subjective: No acute overnight events  Objective: Filed Vitals:   10/13/12 0109 10/13/12 0112 10/13/12 0557  BP:   122/79  Pulse: 88  89   Temp: 98 F (36.7 C)  97.2 F (36.2 C)  TempSrc: Oral  Oral  Resp: 18  18  Height:   5\' 6"  (1.676 m)  Weight:   80.967 kg (178 lb 8 oz)  SpO2: 87% 93% 97%    Intake/Output Summary (Last 24 hours) at 10/13/12 1252 Last data filed at 10/13/12 0755  Gross per 24 hour  Intake      0 ml  Output      0 ml  Net      0 ml    Exam:   General:  Pt is alert, follows commands appropriately, not in acute distress  Cardiovascular: Regular rate and rhythm, S1/S2, no murmurs, no rubs, no gallops  Respiratory: Clear to auscultation bilaterally, no wheezing, no crackles, no rhonchi  Abdomen: Soft, non tender, non distended, bowel sounds present, no guarding  Extremities: Right foot with dressing, left foot pulses palpable  Neuro: Grossly nonfocal  Data Reviewed: Basic Metabolic Panel:  Recent Labs Lab 10/13/12 0150  NA 135  K 5.1  CL 106  CO2 23  GLUCOSE 247*  BUN 24*  CREATININE 1.00  CALCIUM 11.4*   CBC:  Recent Labs Lab 10/13/12 0150  WBC 6.8  NEUTROABS 4.5  HGB 9.7*  HCT 31.7*  MCV 81.7  PLT 267   Cardiac Enzymes:  Recent Labs Lab 10/13/12 0150  TROPONINI <0.30   BNP: No components found with this basename: POCBNP,  CBG:  Recent Labs Lab 10/13/12 0620 10/13/12 0738  GLUCAP 262* 222*    No results found for this or any previous visit (from the past 240 hour(s)).   Studies: Dg Chest 1 View 10/13/2012  *  IMPRESSION: Cardiomegaly with central vascular  congestion.  Increased interstitial markings may reflect atypical infection or edema.  Small right pleural effusion.  Bibasilar opacities; atelectasis versus infiltrate.    Dg Thoracic Spine 2 View 10/13/2012  * IMPRESSION: No displaced fracture identified. If clinical concern for fracture persists, recommend cross-sectional imaging through the focal level of concern.  Mild multilevel degenerative changes.   Original Report Authenticated By: Jearld Lesch, M.D.    Dg Hip Complete Left 10/13/2012    IMPRESSION: No displaced left hip fracture.  Osteopenia.  MRI recommended if clinical concern for nondisplaced fracture persists.   Original Report Authenticated By: Jearld Lesch, M.D.    Dg Foot Complete Right 10/13/2012  * IMPRESSION: Periosteal reaction along the second metatarsal and deformity/defect at the tip of the second distal phalanx.  Osteomyelitis not excluded. MRI has increased sensitivity.   Original Report Authenticated By: Jearld Lesch, M.D.     Scheduled Meds: . aspirin EC  81 mg Oral Daily  . atorvastatin  10 mg Oral q1800  . carvedilol  6.25 mg Oral BID WC  . ciprofloxacin  400 mg Intravenous Q12H  . famotidine  20 mg Oral Daily  . furosemide  40 mg Oral Daily  . heparin  5,000 Units Subcutaneous Q8H  . insulin aspart  0-15 Units Subcutaneous TID WC  . insulin aspart  0-5 Units Subcutaneous QHS  . isosorbide mononitrate  30 mg Oral Daily  . metFORMIN  500 mg Oral BID WC  . spironolactone  12.5 mg Oral Daily  . vancomycin  1,000 mg Intravenous Q12H   Continuous Infusions: . sodium chloride 1,000 mL (10/13/12 0210)

## 2012-10-13 NOTE — Care Management (Signed)
CARE MANAGEMENT NOTE 10/13/2012  Patient:  [name] [name], [name] [name]   Account Number:  [id number]  Date Initiated:  10/13/2012  Documentation initiated by:  Laure Leone  Subjective/Objective Assessment:   67 yo female admitted with right gangrenous foot. PTA pt from home alone.Pt states falling recently at home.     Action/Plan:   Home when stable   Anticipated DC Date:     Anticipated DC Plan:  HOME W HOME HEALTH SERVICES      DC Planning Services  CM consult      Choice offered to / List presented to:             Status of service:  In process, will continue to follow Medicare Important Message given?   (If response is "NO", the following Medicare IM given date fields will be blank) Date Medicare IM given:   Date Additional Medicare IM given:    Discharge Disposition:    Per UR Regulation:  Reviewed for med. necessity/level of care/duration of stay  If discussed at Long Length of Stay Meetings, dates discussed:    Comments:  10/13/12 1109 [name] Milsap,RN,BSN [phone] Cm spoke with patient concerning discharge planning. Pt states previously residing with friend for home care assistance. Cm spoke of possible SNF placement. Pt states friend able to provide home care with proper instructions. Pt request BSC upon discharge. Pt states having RW,wheelchair, and cane for home use.

## 2012-10-13 NOTE — ED Notes (Signed)
[id number]<AV> Expected date:<BR> Expected time:<BR> Means of arrival:<BR> Comments:<BR> EMS/66 yo female slid off toliet last night and is now c/o left side pain

## 2012-10-13 NOTE — Progress Notes (Signed)
ANTIBIOTIC CONSULT NOTE - INITIAL  Pharmacy Consult for vancomycin Indication: cellulitis  Allergies  Allergen Reactions  . Penicillins Anaphylaxis  . Sulfa Antibiotics Anaphylaxis    Patient Measurements: Height: 5\' 6"  (167.6 cm) Weight: 178 lb 8 oz (80.967 kg) IBW/kg (Calculated) : 59.3 Adjusted Body Weight:   Vital Signs: Temp: 97.2 F (36.2 C) (02/20 0557) Temp src: Oral (02/20 0557) BP: 122/79 mmHg (02/20 0557) Pulse Rate: 89 (02/20 0557) Intake/Output from previous day:   Intake/Output from this shift:    Labs:  Recent Labs  10/13/12 0150  WBC 6.8  HGB 9.7*  PLT 267  CREATININE 1.00   Estimated Creatinine Clearance: 59.4 ml/min (by C-G formula based on Cr of 1). No results found for this basename: VANCOTROUGH, VANCOPEAK, VANCORANDOM, GENTTROUGH, GENTPEAK, GENTRANDOM, TOBRATROUGH, TOBRAPEAK, TOBRARND, AMIKACINPEAK, AMIKACINTROU, AMIKACIN,  in the last 72 hours   Microbiology: No results found for this or any previous visit (from the past 720 hour(s)).  Medical History: Past Medical History  Diagnosis Date  . 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 Diabetes mellitus type 2, uncontrolled, with complications     Very poorly controlled  . Coronary artery disease     Severe Multivessel Disase  . DVT, bilateral lower limbs 05/2012  . 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  First dose of antibiotics already given in ED.  Goal of Therapy:  Vancomycin trough level 10-15 mcg/ml  Plan:  Measure antibiotic drug levels at steady state Follow up culture results Vancomycin 1500mg  iv q24hr  Darlina Guys, Jacquenette Shone Crowford 10/13/2012,6:41 AM

## 2012-10-13 NOTE — Progress Notes (Signed)
Inpatient Diabetes Program Recommendations  AACE/ADA: New Consensus Statement on Inpatient Glycemic Control (2013)  Target Ranges:  Prepandial:   less than 140 mg/dL      Peak postprandial:   less than 180 mg/dL (1-2 hours)      Critically ill patients:  140 - 180 mg/dL   Results for [name] [name], [name] [name] (MRN [id number]) as of 10/13/2012 10:53  Ref. Range 10/13/2012 06:20 10/13/2012 07:38  Glucose-Capillary Latest Range: 70-99 mg/dL 409 (H) 811 (H)   Results for [name] [name], [name] [name] (MRN [id number]) as of 10/13/2012 10:53  Ref. Range 06/27/2012 14:30  Hemoglobin A1C Latest Range: <5.7 % 9.0 (H)   Inpatient Diabetes Program Recommendations Insulin - Basal: Please consider ordering low dose basal insulin.  Recommned Levemir or Lantus 10 units daily. HgbA1C: Please consider ordering an A1C.  Last A1C was 9.0% on 06/27/2012. Diet: Please consider changing diet from Regular to Carb Modified Diabetic diet.  Note:  Patient has a history of diabetes and takes Novolog 0-9 units TID and Metformin 500 mg BID at home for diabetes management.  Currently patient is ordered to receive Novolog sensitive and Novolog moderate correction and Metformin 500 mg BID for inpatient glycemic control.  The Novolog sensitive correction was carried forward from home medication list and an order was placed by Dr. Conley Rolls for Novolog moderate correction.  Therefore, I discontinued the Novolog sensitive correction carried forward from the home medication list.  Also called unit 1300 to inform nurse but was told that Eber Jones, RN was busy and the secretary took a message from me to let Eber Jones know that I discontinued the sensitive correction scale.   Please consider ordering low dose basal insulin since blood glucose has ranged from 247-262 mg/dl over the past 10 hours.  Recommend Levemir OR Lantus 10 units daily.  Also, please consider changing diet from regular to carb modified diabetic diet and ordering an A1C.  Will continue to  follow.  Thanks, Orlando Penner, RN, BSN, CCRN Diabetes Coordinator Inpatient Diabetes Program [phone]

## 2012-10-13 NOTE — ED Notes (Signed)
Dr. Houston Siren at bedside.

## 2012-10-13 NOTE — Progress Notes (Addendum)
Brief Pharmacy Note Vanc  See prev pharmacy note. Vanc initiated at 1500mg  IV q24h (HS). Rec'd 1g in ER this AM ~ 0400. Given probable osteo, will increase vanc trough goal to 15-20 mcg/ml and change Vanc dose to 1g IV q12h.   Gwen Her PharmD  [phone] 10/13/2012 9:49 AM   Addendum, 1:25 PM :  MD had d/c Cipro and requesting Levaquin per Rx for possible PNA CrCl 59  Levaquin 750mg  IV q24h Follow Scr Adjust as necessary  Gwen Her PharmD  [phone] 10/13/2012 1:25 PM

## 2012-10-14 LAB — GLUCOSE, CAPILLARY
Glucose-Capillary: 140 mg/dL — ABNORMAL HIGH (ref 70–99)
Glucose-Capillary: 133 mg/dL — ABNORMAL HIGH (ref 70–99)
Glucose-Capillary: 114 mg/dL — ABNORMAL HIGH (ref 70–99)

## 2012-10-14 LAB — BASIC METABOLIC PANEL
Chloride: 106 mEq/L (ref 96–112)
GFR calc Af Amer: 60 mL/min — ABNORMAL LOW (ref >90)
GFR calc non Af Amer: 52 mL/min — ABNORMAL LOW (ref >90)
Potassium: 5 mEq/L (ref 3.5–5.1)
Sodium: 135 mEq/L (ref 135–145)

## 2012-10-14 LAB — CBC
HCT: 30.4 % — ABNORMAL LOW (ref 36.0–46.0)
Hemoglobin: 9 g/dL — ABNORMAL LOW (ref 12.0–15.0)
WBC: 7 10*3/uL (ref 4.0–10.5)

## 2012-10-14 LAB — MRSA PCR SCREENING: MRSA by PCR: NEGATIVE

## 2012-10-14 MED ORDER — WARFARIN SODIUM 5 MG PO TABS
5.0000 mg | ORAL_TABLET | Freq: Once | ORAL | Status: AC
  Administered 2012-10-14: 5 mg via ORAL
  Filled 2012-10-14: qty 1

## 2012-10-14 MED ORDER — WARFARIN - PHARMACIST DOSING INPATIENT
Freq: Every day | Status: DC
  Administered 2012-10-14: 18:00:00

## 2012-10-14 MED ORDER — SODIUM CHLORIDE 0.9 % IV BOLUS (SEPSIS)
500.0000 mL | Freq: Once | INTRAVENOUS | Status: AC
  Administered 2012-10-14: 500 mL via INTRAVENOUS

## 2012-10-14 MED ORDER — PROMETHAZINE HCL 25 MG/ML IJ SOLN
12.5000 mg | Freq: Four times a day (QID) | INTRAMUSCULAR | Status: DC | PRN
  Administered 2012-10-14: 6.25 mg via INTRAMUSCULAR
  Filled 2012-10-14 (×2): qty 1

## 2012-10-14 NOTE — Progress Notes (Signed)
ANTICOAGULATION CONSULT NOTE - Initial Consult  Pharmacy Consult for Coumadin Indication: Hx DVT, Coumadin PTA   Allergies  Allergen Reactions  . Penicillins Anaphylaxis  . Sulfa Antibiotics Anaphylaxis    Patient Measurements: Height: 5\' 6"  (167.6 cm) Weight: 178 lb 8 oz (80.967 kg) IBW/kg (Calculated) : 59.3  Vital Signs: Temp: 98.4 F (36.9 C) (02/21 0623) Temp src: Oral (02/21 0623) BP: 77/46 mmHg (02/21 1453) Pulse Rate: 76 (02/21 1453)  Labs:  Recent Labs  10/13/12 0150 10/14/12 0347  HGB 9.7* 9.0*  HCT 31.7* 30.4*  PLT 267 260  LABPROT 16.2*  --   INR 1.33  --   CREATININE 1.00 1.09  TROPONINI <0.30  --     Estimated Creatinine Clearance: 54.5 ml/min (by C-G formula based on Cr of 1.09).   Medical History: Past Medical History  Diagnosis Date  . 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 Diabetes mellitus type 2, uncontrolled, with complications     Very poorly controlled  . Coronary artery disease     Severe Multivessel Disase  . DVT, bilateral lower limbs 05/2012  . 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 acetaminophen (TYLENOL) 325 MG tablet Take 2 tablets (650 mg total) by mouth every 4 (four) hours as needed.      Marland Kitchen aspirin EC 81 MG EC tablet Take 1 tablet (81 mg total) by mouth daily.      Marland Kitchen atorvastatin (LIPITOR) 10 MG tablet Take 1 tablet (10 mg total) by mouth daily at 6 PM.  30 tablet  5  . furosemide (LASIX) 40 MG tablet Take 1 tablet (40 mg total) by mouth daily.  30 tablet  5  . isosorbide mononitrate (IMDUR) 30 MG 24 hr tablet Take 1 tablet (30 mg total) by mouth daily.  30 tablet  5  . LORazepam (ATIVAN) 0.5 MG tablet Take 0.5 tablets (0.25 mg total) by mouth every 12 (twelve) hours as needed for anxiety.  30 tablet  0  . metFORMIN (GLUCOPHAGE) 500 MG tablet Take 1 tablet (500 mg total) by mouth 2 (two) times daily with a meal.  60  tablet  5  . ranitidine (ZANTAC) 150 MG tablet Take 150 mg by mouth 2 (two) times daily.      Marland Kitchen warfarin (COUMADIN) 5 MG tablet Take 1 tablet (5 mg total) by mouth daily.  30 tablet  11  . carvedilol (COREG) 6.25 MG tablet Take 1 tablet (6.25 mg total) by mouth 2 (two) times daily with a meal.  60 tablet  5  . insulin aspart (NOVOLOG) 100 UNIT/ML injection Inject 0-9 Units into the skin 3 (three) times daily with meals.  1 vial  10  . spironolactone (ALDACTONE) 12.5 mg TABS Take 0.5 tablets (12.5 mg total) by mouth daily.  30 tablet  5   Scheduled:  . antiseptic oral rinse  15 mL Mouth Rinse BID  . aspirin EC  81 mg Oral Daily  . atorvastatin  10 mg Oral q1800  . carvedilol  6.25 mg Oral BID WC  . famotidine  20 mg Oral Daily  . furosemide  40 mg Oral Daily  . heparin  5,000 Units Subcutaneous Q8H  . insulin aspart  0-15 Units Subcutaneous TID WC  . insulin aspart  0-5 Units Subcutaneous QHS  . isosorbide mononitrate  30 mg Oral Daily  . levofloxacin (LEVAQUIN) IV  750 mg Intravenous  Q24H  . [COMPLETED] sodium chloride  500 mL Intravenous Once  . [COMPLETED] sodium chloride  500 mL Intravenous Once  . spironolactone  12.5 mg Oral Daily  . vancomycin  1,000 mg Intravenous Q12H   Infusions:  . sodium chloride 1,000 mL (10/14/12 0108)   PRN: acetaminophen, LORazepam, morphine injection, ondansetron (ZOFRAN) IV, ondansetron, oxyCODONE-acetaminophen, promethazine  Assessment: 66 YOF admitted 2/20 w/necrotic right foot. On Coumadin PTA for hx DVT. Coumadin held on admission in anticipation of surgery and heparin sq for vte ppx started. Pt refusing ortho consult and amputation so Coumadin to be resumed - pharmacy to manage dosing. Dose PTA 5mg  daily  INR tx on admission but became subtx 2/20  No bleeding reported  Pt on Levaquin for possible pna - may increase INR/decrease Coumadin requirements Goal of Therapy:  INR 2-3   Plan:  Daily INR Coumadin 2.5mg  tonight  Gwen Her  PharmD  [phone] 10/14/2012 3:15 PM

## 2012-10-14 NOTE — Progress Notes (Signed)
[facility] PROGRESS NOTE  [name] [name] ZOX:[id number] DOB: May 04, 1946 DOA: 10/13/2012 PCP: No primary provider on file.  Brief narrative: 67 y.o. female with ischemic dilated CMP EF 25%, multivessel CAD needing CABG, DM2 uncontrolled presented to [facility] long ED 10/13/2012 status post fall at home. In ED, patient was found to have gangrenous right foot/cellulitis. We have offered orthopedics consult for evaluation of right foot gangrene however patient has refused orthopedic consult and only agree to antibiotic management. In addition, evaluation in ED included x-ray of the right foot which did not reveal fractures by osteomyelitis could not be excluded. Furthermore, x-ray of left hip and thoracic spine showed no displaced fracture identified. CBC done in ED revealed hemoglobin of 9.7.   Assessment/Plan:   Principal Problem:  Gangrene of foot/ osteomyelitis  Continue vancomycin for now  Patient refused orthopedic consult  Followup physical therapy recommendation Active Problems:  Possible pneumonia  Would discontinue ciprofloxacin which was started on admission.  We started Levaquin 10/13/2012 and we will continue this regimen. DM, Type 2 IDDM  We will hold metformin and continue sliding scale insulin in the setting of acute infection CAD, 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 She needs CABG  Continue aspirin, atorvastatin Hypertension  Continue Corag, Imdur, Spironolactone   Code Status: Full code  Family Communication: Family not at bedside  Disposition Plan: Home when stable   [name], MD  [facility]  Pager [phone]   Consultants:  None Procedures:  None Antibiotics:  Vancomycin 10/13/2012 -->  Levaquin 10/13/2012 -->   If 7PM-7AM, please contact night-coverage [email or web address] Password [facility] 10/14/2012, 7:03 AM   LOS: 1 day    HPI/Subjective: No acute events overnight.  Objective: Filed Vitals:   10/13/12 1412 10/13/12 1950 10/13/12 2244 10/14/12 0215  BP:  108/63  101/54 116/71  Pulse: 76 78 74 77  Temp: 97.5 F (36.4 C)  98.2 F (36.8 C) 98.4 F (36.9 C)  TempSrc: Oral  Oral Oral  Resp: 18  16 16   Height:      Weight:      SpO2: 91%  81% 98%    Intake/Output Summary (Last 24 hours) at 10/14/12 0703 Last data filed at 10/13/12 2330  Gross per 24 hour  Intake 3566.67 ml  Output    900 ml  Net 2666.67 ml    Exam:   General:  Pt is alert, follows commands appropriately, not in acute distress  Cardiovascular: Regular rate and rhythm, S1/S2, no murmurs, no rubs, no gallops  Respiratory: Clear to auscultation bilaterally, no wheezing, no crackles, no rhonchi  Abdomen: Soft, non tender, non distended, bowel sounds present, no guarding  Extremities: Right lower extremity dressing present, pulses DP and PT palpable bilaterally  Neuro: Grossly nonfocal  Data Reviewed: Basic Metabolic Panel:  Recent Labs Lab 10/13/12 0150 10/14/12 0347  NA 135 135  K 5.1 5.0  CL 106 106  CO2 23 22  GLUCOSE 247* 138*  BUN 24* 21  CREATININE 1.00 1.09  CALCIUM 11.4* 10.3   Liver Function Tests: No results found for this basename: AST, ALT, ALKPHOS, BILITOT, PROT, ALBUMIN,  in the last 168 hours No results found for this basename: LIPASE, AMYLASE,  in the last 168 hours No results found for this basename: AMMONIA,  in the last 168 hours CBC:  Recent Labs Lab 10/13/12 0150 10/14/12 0347  WBC 6.8 7.0  NEUTROABS 4.5  --   HGB 9.7* 9.0*  HCT 31.7* 30.4*  MCV 81.7 83.1  PLT 267 260  Cardiac Enzymes:  Recent Labs Lab 10/13/12 0150  TROPONINI <0.30   BNP: No components found with this basename: POCBNP,  CBG:  Recent Labs Lab 10/13/12 0620 10/13/12 0738 10/13/12 1348 10/13/12 1701 10/13/12 2313  GLUCAP 262* 222* 118* 144* 140*    No results found for this or any previous visit (from the past 240 hour(s)).   Studies: Dg Chest 1 View  10/13/2012  *RADIOLOGY REPORT*  Clinical Data: Fall, back pain  CHEST - 1 VIEW   Comparison: 07/18/2012  Findings: Heart size upper normal.  Central vascular congestion. Increased perihilar markings.  Small right pleural effusion.  Mild bibasilar opacities.  No pneumothorax.  No acute osseous finding.  IMPRESSION: Cardiomegaly with central vascular congestion.  Increased interstitial markings may reflect atypical infection or edema.  Small right pleural effusion.  Bibasilar opacities; atelectasis versus infiltrate.   Original Report Authenticated By: Jearld Lesch, M.D.    Dg Thoracic Spine 2 View  10/13/2012  *RADIOLOGY REPORT*  Clinical Data: Fall, back pain  THORACIC SPINE - 2 VIEW  Comparison: 07/08/2012  Findings: Limited by cross-table lateral technique.  Diffuse osteopenia.  Mild degenerative changes.  No displaced fracture identified. Alignment maintained.  IMPRESSION: No displaced fracture identified. If clinical concern for fracture persists, recommend cross-sectional imaging through the focal level of concern.  Mild multilevel degenerative changes.   Original Report Authenticated By: Jearld Lesch, M.D.    Dg Hip Complete Left  10/13/2012  *RADIOLOGY REPORT*  Clinical Data: Left hip pain  LEFT HIP - COMPLETE 2+ VIEW  Comparison: None.  Findings: Diffuse osteopenia.  No displaced fracture or dislocation.  No aggressive osseous lesions.  IMPRESSION: No displaced left hip fracture.  Osteopenia.  MRI recommended if clinical concern for nondisplaced fracture persists.   Original Report Authenticated By: Jearld Lesch, M.D.    Dg Foot Complete Right  10/13/2012  *RADIOLOGY REPORT*  Clinical Data: Diabetic, painful toes.  RIGHT FOOT COMPLETE - 3+ VIEW  Comparison: None.  Findings: Diffuse osteopenia.  Periosteal reaction along the medial margin of the second metatarsal. Irregularity/defect at the tip of the distal phalanx second digit.  No acute fracture or dislocation. No radiopaque foreign body.  The dorsal soft tissue swelling.  IMPRESSION: Periosteal reaction along the  second metatarsal and deformity/defect at the tip of the second distal phalanx.  Osteomyelitis not excluded. MRI has increased sensitivity.   Original Report Authenticated By: Jearld Lesch, M.D.     Scheduled Meds: . antiseptic oral rinse  15 mL Mouth Rinse BID  . aspirin EC  81 mg Oral Daily  . atorvastatin  10 mg Oral q1800  . carvedilol  6.25 mg Oral BID WC  . famotidine  20 mg Oral Daily  . furosemide  40 mg Oral Daily  . heparin  5,000 Units Subcutaneous Q8H  . insulin aspart  0-15 Units Subcutaneous TID WC  . insulin aspart  0-5 Units Subcutaneous QHS  . isosorbide mononitrate  30 mg Oral Daily  . levofloxacin (LEVAQUIN) IV  750 mg Intravenous Q24H  . spironolactone  12.5 mg Oral Daily  . vancomycin  1,000 mg Intravenous Q12H   Continuous Infusions: . sodium chloride 1,000 mL (10/14/12 0108)

## 2012-10-14 NOTE — Progress Notes (Signed)
Patient with complaints of feeling dizzy, BP 70/42 P 77. Alert/ oriented. 500cc NS bolus given per order. Follow up BP 111/65 P 75. Will continue to monitor.

## 2012-10-14 NOTE — Progress Notes (Signed)
Patient c/o of nausea. Last prn zofran <6 hours ago. Paged MD on call for additional prns--order obtained.

## 2012-10-14 NOTE — Progress Notes (Signed)
Pt with complaints of feeling dizzy, BP 77/46 P 76. MD notified, will give 500 cc bolus.

## 2012-10-14 NOTE — Progress Notes (Signed)
bp remains low post bolus, md notified, pt to be transferred to step down.

## 2012-10-14 NOTE — Progress Notes (Signed)
PT Cancellation Note  Patient Details Name: [name] [name] MRN: [id number] DOB: 06/29/1946   Cancelled Treatment:    Reason Eval/Treat Not Completed: Medical issues which prohibited therapy Pt with low BP per RN and dizzy in supine.  Will hold therapy today.   Analina Filla,KATHrine E 10/14/2012, 3:02 PM Zenovia Jarred, PT, DPT 10/14/2012 Pager: [phone]

## 2012-10-15 ENCOUNTER — Encounter (HOSPITAL_COMMUNITY): Payer: Self-pay | Admitting: Cardiology

## 2012-10-15 LAB — GLUCOSE, CAPILLARY: Glucose-Capillary: 116 mg/dL — ABNORMAL HIGH (ref 70–99)

## 2012-10-15 LAB — PROTIME-INR: Prothrombin Time: 16 seconds — ABNORMAL HIGH (ref 11.6–15.2)

## 2012-10-15 MED ORDER — SODIUM CHLORIDE 0.9 % IV SOLN
INTRAVENOUS | Status: DC
  Administered 2012-10-17: 20 mL/h via INTRAVENOUS

## 2012-10-15 MED ORDER — DOCUSATE SODIUM 100 MG PO CAPS
100.0000 mg | ORAL_CAPSULE | Freq: Two times a day (BID) | ORAL | Status: DC | PRN
  Administered 2012-10-17: 100 mg via ORAL
  Filled 2012-10-15: qty 1

## 2012-10-15 MED ORDER — PROMETHAZINE HCL 25 MG/ML IJ SOLN
12.5000 mg | Freq: Four times a day (QID) | INTRAMUSCULAR | Status: DC | PRN
  Administered 2012-10-15 – 2012-10-20 (×2): 12.5 mg via INTRAVENOUS
  Filled 2012-10-15: qty 1

## 2012-10-15 MED ORDER — FUROSEMIDE 40 MG PO TABS
40.0000 mg | ORAL_TABLET | Freq: Two times a day (BID) | ORAL | Status: DC
  Filled 2012-10-15 (×3): qty 1

## 2012-10-15 MED ORDER — LORAZEPAM 0.5 MG PO TABS
0.2500 mg | ORAL_TABLET | Freq: Once | ORAL | Status: AC
  Administered 2012-10-15: 0.25 mg via ORAL
  Filled 2012-10-15: qty 1

## 2012-10-15 NOTE — Consult Note (Signed)
Reason for Consult:  Right foot necrosis Referring Physician: Elisabeth Pigeon, MD  [name] [name] is an 67 y.o. female.  HPI:   67 yo female who was admitted two days ago with an ischemic, necrotic right foot.  This has not responded to conservative treatment and ortho is consulted to assess for an amputation.    Past Medical History  Diagnosis Date  . 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 Diabetes mellitus type 2, uncontrolled, with complications     Very poorly controlled  . Coronary artery disease     Severe Multivessel Disase  . DVT, bilateral lower limbs 05/2012  . 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 Vaginal hysterectomy  ~ 1980  . Cesarean section  1979  . Lumbar disc surgery  1980's  . Facial reconstruction surgery    . Cardiac catheterization      Family History  Problem Relation Age of Onset  . Cancer Mother   . CAD Mother   . Cancer Father   . CAD Father     Social History:  reports that she has quit smoking. Her smoking use included Cigarettes. She has a 10 pack-year smoking history. She has never used smokeless tobacco. She reports that she does not drink alcohol or use illicit drugs.  Allergies:  Allergies  Allergen Reactions  . Penicillins Anaphylaxis  . Sulfa Antibiotics Anaphylaxis    Medications: I have reviewed the patient's current medications.  Results for orders placed during the hospital encounter of 10/13/12 (from the past 48 hour(s))  GLUCOSE, CAPILLARY     Status: Abnormal   Collection Time    10/13/12  1:48 PM      Result Value Range   Glucose-Capillary 118 (*) 70 - 99 mg/dL  GLUCOSE, CAPILLARY     Status: Abnormal   Collection Time    10/13/12  5:01 PM      Result Value Range   Glucose-Capillary 144 (*) 70 - 99 mg/dL  GLUCOSE, CAPILLARY     Status: Abnormal   Collection Time    10/13/12 11:13 PM      Result Value Range   Glucose-Capillary 140 (*) 70 - 99  mg/dL  BASIC METABOLIC PANEL     Status: Abnormal   Collection Time    10/14/12  3:47 AM      Result Value Range   Sodium 135  135 - 145 mEq/L   Potassium 5.0  3.5 - 5.1 mEq/L   Chloride 106  96 - 112 mEq/L   CO2 22  19 - 32 mEq/L   Glucose, Bld 138 (*) 70 - 99 mg/dL   BUN 21  6 - 23 mg/dL   Creatinine, Ser 8.11  0.50 - 1.10 mg/dL   Calcium 91.4  8.4 - 78.2 mg/dL   GFR calc non Af Amer 52 (*) >90 mL/min   GFR calc Af Amer 60 (*) >90 mL/min   Comment:            The eGFR has been calculated     using the CKD EPI equation.     This calculation has not been     validated in all clinical     situations.     eGFR's persistently     <90 mL/min signify     possible Chronic Kidney Disease.  CBC     Status: Abnormal   Collection Time    10/14/12  3:47 AM      Result Value Range   WBC 7.0  4.0 - 10.5 K/uL   RBC 3.66 (*) 3.87 - 5.11 MIL/uL   Hemoglobin 9.0 (*) 12.0 - 15.0 g/dL   HCT 16.1 (*) 09.6 - 04.5 %   MCV 83.1  78.0 - 100.0 fL   MCH 24.6 (*) 26.0 - 34.0 pg   MCHC 29.6 (*) 30.0 - 36.0 g/dL   RDW 40.9 (*) 81.1 - 91.4 %   Platelets 260  150 - 400 K/uL  GLUCOSE, CAPILLARY     Status: Abnormal   Collection Time    10/14/12  7:59 AM      Result Value Range   Glucose-Capillary 133 (*) 70 - 99 mg/dL  GLUCOSE, CAPILLARY     Status: Abnormal   Collection Time    10/14/12 12:28 PM      Result Value Range   Glucose-Capillary 127 (*) 70 - 99 mg/dL  GLUCOSE, CAPILLARY     Status: Abnormal   Collection Time    10/14/12  4:56 PM      Result Value Range   Glucose-Capillary 135 (*) 70 - 99 mg/dL  PROCALCITONIN     Status: None   Collection Time    10/14/12  5:53 PM      Result Value Range   Procalcitonin <0.10     Comment:            Interpretation:     PCT (Procalcitonin) <= 0.5 ng/mL:     Systemic infection (sepsis) is not likely.     Local bacterial infection is possible.     (NOTE)             ICU PCT Algorithm               Non ICU PCT Algorithm         ----------------------------     ------------------------------             PCT < 0.25 ng/mL                 PCT < 0.1 ng/mL         Stopping of antibiotics            Stopping of antibiotics           strongly encouraged.               strongly encouraged.        ----------------------------     ------------------------------           PCT level decrease by               PCT < 0.25 ng/mL           >= 80% from peak PCT           OR PCT 0.25 - 0.5 ng/mL          Stopping of antibiotics                                                 encouraged.         Stopping of antibiotics               encouraged.        ----------------------------     ------------------------------  PCT level decrease by              PCT >= 0.25 ng/mL           < 80% from peak PCT            AND PCT >= 0.5 ng/mL            Continuing antibiotics                                                  encouraged.           Continuing antibiotics                encouraged.        ----------------------------     ------------------------------         PCT level increase compared          PCT > 0.5 ng/mL             with peak PCT AND              PCT >= 0.5 ng/mL             Escalation of antibiotics                                              strongly encouraged.          Escalation of antibiotics            strongly encouraged.  MRSA PCR SCREENING     Status: None   Collection Time    10/14/12  7:00 PM      Result Value Range   MRSA by PCR NEGATIVE  NEGATIVE   Comment:            The GeneXpert MRSA Assay (FDA     approved for NASAL specimens     only), is one component of a     comprehensive MRSA colonization     surveillance program. It is not     intended to diagnose MRSA     infection nor to guide or     monitor treatment for     MRSA infections.  GLUCOSE, CAPILLARY     Status: Abnormal   Collection Time    10/14/12 10:05 PM      Result Value Range   Glucose-Capillary 114 (*) 70 - 99 mg/dL    Comment 1 Documented in Chart     Comment 2 Notify RN    PROTIME-INR     Status: Abnormal   Collection Time    10/15/12  3:42 AM      Result Value Range   Prothrombin Time 16.0 (*) 11.6 - 15.2 seconds   INR 1.31  0.00 - 1.49  GLUCOSE, CAPILLARY     Status: Abnormal   Collection Time    10/15/12  8:48 AM      Result Value Range   Glucose-Capillary 116 (*) 70 - 99 mg/dL    No results found.  ROS Blood pressure 115/69, pulse 77, temperature 97.7 F (36.5 C), temperature source Oral, resp. rate 24, height 5\' 6"  (1.676 m), weight 89.268 kg (196 lb 12.8 oz), SpO2 94.00%. Physical Exam  Musculoskeletal:  Feet:  Very weak pulses, decreased sensation  Assessment/Plan: Right forefoot necrosis over a wide area 1) I spoke to [name] [name] in length and recommended a right foot transmetatarsal amputation given the degree of necrosis.  She understands why this needs to be done and does wish to proceed.  I explained in great detail the risks of surgery as well as the risks to her of no surgery.  [name] [name] 10/15/2012, 11:39 AM

## 2012-10-15 NOTE — Consult Note (Signed)
Reason for Consult: Pre Op Referring Physician:   KANDEE ESCALANTE is an 67 y.o. female.  HPI:   This is a 67 y.o. female who was previously discharged after being hospitalized 06/21/12-07/09/12 for Lower Extremity edema -- bilateral DVT & R Popliteal Artery occlusion. We saw her in consult, then transferred her to [facility] service after LHC demonstrated severe 3V disease. She was not felt to be a candidate at that time for CABG and the plan was to treat her multiple medical issues and then plan elective CABG at a later date. She was discharged on Coumadin. She presented back to the ED on 07/11/12 with a complaint of SOB. Her BNP was 10K and she also had PAF in the ED. She was admitted for acute on chronic systolic CHF and was started on IV Lasix along with oral Aldactone. Subsequently, she had an increase in K+, and the Aldactone was DCd. Ranexa was also added as an antianginal. DVT of the right posterior tibial vein and left profunda vein were found during previous admission on 06/22/12. Coumadin started at that time. Toprol was changed to Coreg. Nephrology was consulted due to CKD stage three, hypercalcemia. Hydralazine, which had been initiated earlier, was discontinued to allow higher BP to facilitate better renal perfusion. Lasix was discontinued as well.  Cellulitis in right foot improved. 2D echo revealed EF of 30%, diffuse hypokinesis and grade 2 diastolic dysfunction(See full report for details). Life vest was arranged, fitted and discharged with the patient.  ACE and ARB not prescribed secondary to RI and hyperkalemia.   The patient has not followed up at [facility] since her discharge on 07/25/12 because it cost too much and can not afford it.  The once a week coumadin checks were too much and stopped taking it.  She now presents with an ischemic and necrotic right foot.  Orthopaedics was consulted and recommends a transmetatarsal amputation.  The patient has been none compliant.  She reports that the  lifevest shocked her several weeks ago but was not "Strong" enough to do anything.  She reports having to "punch" it several times in order to get it to stop the sent it back to [facility].   She denies N, V, fever, SOB, orthopnea, Abd pain, pain in her foot.    Past Medical History  Diagnosis Date  . 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 Diabetes mellitus type 2, uncontrolled, with complications     Very poorly controlled  . Coronary artery disease     Severe Multivessel Disase  . DVT, bilateral lower limbs 05/2012  . 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 Vaginal hysterectomy  ~ 1980  . Cesarean section  1979  . Lumbar disc surgery  1980's  . Facial reconstruction surgery    . Cardiac catheterization      Family History  Problem Relation Age of Onset  . Cancer Mother   . CAD Mother   . Cancer Father   . CAD Father     Social History:  reports that she has quit smoking. Her smoking use included Cigarettes. She has a 10 pack-year smoking history. She has never used smokeless tobacco. She reports that she does not drink alcohol or use illicit drugs.  Allergies:  Allergies  Allergen Reactions  . Penicillins Anaphylaxis  . Sulfa Antibiotics Anaphylaxis    Medications: Scheduled Meds: . antiseptic oral rinse  15 mL Mouth Rinse BID  .  aspirin EC  81 mg Oral Daily  . atorvastatin  10 mg Oral q1800  . carvedilol  6.25 mg Oral BID WC  . famotidine  20 mg Oral Daily  . [START ON 10/16/2012] furosemide  40 mg Oral BID  . heparin  5,000 Units Subcutaneous Q8H  . insulin aspart  0-15 Units Subcutaneous TID WC  . insulin aspart  0-5 Units Subcutaneous QHS  . isosorbide mononitrate  30 mg Oral Daily  . levofloxacin (LEVAQUIN) IV  750 mg Intravenous Q24H  . spironolactone  12.5 mg Oral Daily  . vancomycin  1,000 mg Intravenous Q12H  . Warfarin - Pharmacist Dosing Inpatient   Does not apply q1800    Continuous Infusions: . sodium chloride 10 mL/hr at 10/15/12 1030   PRN Meds:.acetaminophen, docusate sodium, LORazepam, morphine injection, ondansetron (ZOFRAN) IV, ondansetron, oxyCODONE-acetaminophen, promethazine, promethazine   Results for orders placed during the hospital encounter of 10/13/12 (from the past 48 hour(s))  GLUCOSE, CAPILLARY     Status: Abnormal   Collection Time    10/13/12 11:13 PM      Result Value Range   Glucose-Capillary 140 (*) 70 - 99 mg/dL  BASIC METABOLIC PANEL     Status: Abnormal   Collection Time    10/14/12  3:47 AM      Result Value Range   Sodium 135  135 - 145 mEq/L   Potassium 5.0  3.5 - 5.1 mEq/L   Chloride 106  96 - 112 mEq/L   CO2 22  19 - 32 mEq/L   Glucose, Bld 138 (*) 70 - 99 mg/dL   BUN 21  6 - 23 mg/dL   Creatinine, Ser 0.86  0.50 - 1.10 mg/dL   Calcium 57.8  8.4 - 46.9 mg/dL   GFR calc non Af Amer 52 (*) >90 mL/min   GFR calc Af Amer 60 (*) >90 mL/min   Comment:            The eGFR has been calculated     using the CKD EPI equation.     This calculation has not been     validated in all clinical     situations.     eGFR's persistently     <90 mL/min signify     possible Chronic Kidney Disease.  CBC     Status: Abnormal   Collection Time    10/14/12  3:47 AM      Result Value Range   WBC 7.0  4.0 - 10.5 K/uL   RBC 3.66 (*) 3.87 - 5.11 MIL/uL   Hemoglobin 9.0 (*) 12.0 - 15.0 g/dL   HCT 62.9 (*) 52.8 - 41.3 %   MCV 83.1  78.0 - 100.0 fL   MCH 24.6 (*) 26.0 - 34.0 pg   MCHC 29.6 (*) 30.0 - 36.0 g/dL   RDW 24.4 (*) 01.0 - 27.2 %   Platelets 260  150 - 400 K/uL  GLUCOSE, CAPILLARY     Status: Abnormal   Collection Time    10/14/12  7:59 AM      Result Value Range   Glucose-Capillary 133 (*) 70 - 99 mg/dL  GLUCOSE, CAPILLARY     Status: Abnormal   Collection Time    10/14/12 12:28 PM      Result Value Range   Glucose-Capillary 127 (*) 70 - 99 mg/dL  GLUCOSE, CAPILLARY     Status: Abnormal   Collection Time     10/14/12  4:56 PM  Result Value Range   Glucose-Capillary 135 (*) 70 - 99 mg/dL  PROCALCITONIN     Status: None   Collection Time    10/14/12  5:53 PM      Result Value Range   Procalcitonin <0.10     Comment:            Interpretation:     PCT (Procalcitonin) <= 0.5 ng/mL:     Systemic infection (sepsis) is not likely.     Local bacterial infection is possible.     (NOTE)             ICU PCT Algorithm               Non ICU PCT Algorithm        ----------------------------     ------------------------------             PCT < 0.25 ng/mL                 PCT < 0.1 ng/mL         Stopping of antibiotics            Stopping of antibiotics           strongly encouraged.               strongly encouraged.        ----------------------------     ------------------------------           PCT level decrease by               PCT < 0.25 ng/mL           >= 80% from peak PCT           OR PCT 0.25 - 0.5 ng/mL          Stopping of antibiotics                                                 encouraged.         Stopping of antibiotics               encouraged.        ----------------------------     ------------------------------           PCT level decrease by              PCT >= 0.25 ng/mL           < 80% from peak PCT            AND PCT >= 0.5 ng/mL            Continuing antibiotics                                                  encouraged.           Continuing antibiotics                encouraged.        ----------------------------     ------------------------------         PCT level increase compared          PCT > 0.5 ng/mL  with peak PCT AND              PCT >= 0.5 ng/mL             Escalation of antibiotics                                              strongly encouraged.          Escalation of antibiotics            strongly encouraged.  MRSA PCR SCREENING     Status: None   Collection Time    10/14/12  7:00 PM      Result Value Range   MRSA by PCR NEGATIVE   NEGATIVE   Comment:            The GeneXpert MRSA Assay (FDA     approved for NASAL specimens     only), is one component of a     comprehensive MRSA colonization     surveillance program. It is not     intended to diagnose MRSA     infection nor to guide or     monitor treatment for     MRSA infections.  GLUCOSE, CAPILLARY     Status: Abnormal   Collection Time    10/14/12 10:05 PM      Result Value Range   Glucose-Capillary 114 (*) 70 - 99 mg/dL   Comment 1 Documented in Chart     Comment 2 Notify RN    PROTIME-INR     Status: Abnormal   Collection Time    10/15/12  3:42 AM      Result Value Range   Prothrombin Time 16.0 (*) 11.6 - 15.2 seconds   INR 1.31  0.00 - 1.49  GLUCOSE, CAPILLARY     Status: Abnormal   Collection Time    10/15/12  8:48 AM      Result Value Range   Glucose-Capillary 116 (*) 70 - 99 mg/dL  GLUCOSE, CAPILLARY     Status: Abnormal   Collection Time    10/15/12  4:55 PM      Result Value Range   Glucose-Capillary 150 (*) 70 - 99 mg/dL    No results found.  Review of Systems  Constitutional: Negative for fever and diaphoresis.  HENT: Negative for congestion and sore throat.   Respiratory: Negative for cough and shortness of breath.   Cardiovascular: Positive for leg swelling. Negative for chest pain, palpitations, orthopnea and PND.  Gastrointestinal: Negative for nausea, vomiting and abdominal pain.  Genitourinary: Negative for dysuria.  Neurological: Negative for dizziness.   Blood pressure 119/76, pulse 79, temperature 98.3 F (36.8 C), temperature source Oral, resp. rate 20, height 5\' 6"  (1.676 m), weight 88.225 kg (194 lb 8 oz), SpO2 99.00%. Physical Exam  Constitutional: She is oriented to person, place, and time. No distress.  Obese, deconditioned, noncompliant female  HENT:  Head: Normocephalic and atraumatic.  Mouth/Throat: No oropharyngeal exudate.  Eyes: EOM are normal. Pupils are equal, round, and reactive to light. No scleral  icterus.  Neck: Normal range of motion. Neck supple.  Cardiovascular: Normal rate, regular rhythm, S1 normal and S2 normal.   No murmur heard. Pulses:      Radial pulses are 2+ on the right side, and 2+ on the left side.  Popliteal pulses are 0 on the right side, and 0 on the left side.       Dorsalis pedis pulses are 1+ on the left side.       Posterior tibial pulses are 1+ on the left side.  No Carotid Bruit  Respiratory: Effort normal. She has no wheezes. She has no rales.  Decreased BS  GI: Soft. Bowel sounds are normal. She exhibits distension. There is no tenderness.  Musculoskeletal: She exhibits edema.  LEE all the way up her thighs.  Lymphadenopathy:    She has no cervical adenopathy.  Neurological: She is alert and oriented to person, place, and time. She exhibits normal muscle tone.  Skin: Skin is warm and dry.  Necrotic, malodorous right foot.  Psychiatric: She has a normal mood and affect.    Assessment/Plan: Patient Active Problem List  Diagnosis  . Hypoxemia  . Pedal edema  . Cellulitis, treated  . CHF (congestive heart failure)  . LBBB (left bundle branch block)  . Acute systolic CHF, just discharged 16/10/96  . DVT Jun 21 2012  . DM, Type 2 IDDM  . PVD (peripheral vascular disease)  .  Renal insufficiency, she has been NL at times but now appears to have stage III Chronic RI (moderate)  . Ischemic cardiomyopathy, EF 25% Oct 2013- improved to 30% by 07/20/12 echo  . 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 ischemic Rt foot  . CAD, 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 She needs CABG  . UTI Rx'd with Levaquin X 3 days  . CHF exacerbation  . Chronic anticoagulation, discharged on Coumadin 07/09/12  . PAF, on admission 07/12/12  . Anemia  . Acute on chronic combined systolic and diastolic HF (heart failure), NYHA class 3 - exacerbated by Afib RVR  . Hyperkalemia, not on ACE or ARB.  CK and Lactic acid WNL  . Hypotension, limiting medical Rx  .  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 Nausea & vomiting- started 12/1  . Gangrene of foot    Plan:  High risk patient.  Risks not limited too, and include:  VT/VF and sudden death, PE.   She needs a LE PV angio to delineate her vasculature.  I would recommend this be completed prior to surgery as she has known right popliteal art occlusion by LE art dopplers.  She also has acute deep vein thrombosis involving the right posterior tibial vein and acute deep vein thrombosis involving the left profunda vein which likely have not resolved due noncompliance with coumadin.  Recommend Xarelto after surgery when appropriate.  She maybe more compliant, but cost will be an issue.    She also appears volume overloaded with edema up passed her thighs.  SP 6.5L of fluid on top of an EF of 30-35% and grade two diastolic dysfunction because of hypotension.  Recommend slow diuresis.  Will increase lasix to 40mg  PO BID.  Will check a BNP.   Dobutamine may be a good option if she becomes hypotensive again.  Needs to be in a telemetry bed.   Chadley Dziedzic 10/15/2012, 6:14 PM

## 2012-10-15 NOTE — Evaluation (Signed)
Physical Therapy Evaluation Patient Details Name: [name] [name] MRN: [id number] DOB: 1946/01/05 Today's Date: 10/15/2012 Time: 1191-4782 PT Time Calculation (min): 24 min  PT Assessment / Plan / Recommendation Clinical Impression  Pt admitted for gangrene of right foot/ osteomyelitis.  Pt reports she was ambulating and performing steps with RW prior to admission and also states she has no pain with weight on R LE PTA however encouraged NWB with mobility today.  Pt finally agreeable to ortho consult so may need re-order pending amputation decisions.  Pt would benefit from acute PT services in order to improve independence with bed mobility and transfers to prepare for d/c.  Pt requiring increased assist today and will likely require ST-rehab prior to home.    PT Assessment  Patient needs continued PT services    Follow Up Recommendations  SNF;Supervision/Assistance - 24 hour    Does the patient have the potential to tolerate intense rehabilitation      Barriers to Discharge        Equipment Recommendations  Other (comment) (BSC)    Recommendations for Other Services     Frequency Min 3X/week    Precautions / Restrictions Precautions Precautions: Fall Restrictions Weight Bearing Restrictions: No Other Position/Activity Restrictions: attempted to maintain NWB on R foot however pt reports no pain with WBing prior to admission   Pertinent Vitals/Pain Pt denies pain in R ankle/foot with mobility      Mobility  Bed Mobility Bed Mobility: Supine to Sit;Sit to Supine;Sitting - Scoot to Edge of Bed Supine to Sit: 1: +2 Total assist Supine to Sit: Patient Percentage: 40% Sitting - Scoot to Edge of Bed: 1: +1 Total assist Sit to Supine: 1: +2 Total assist Sit to Supine: Patient Percentage: 20% Details for Bed Mobility Assistance: verbal cues for technique, assist for upper and lower body, bed pad utilized for scooting, increased time and effort to perform and then sat EOB with  supervision for 3 min to mentally prepare for attempting transfer Transfers Transfers: Sit to Stand Sit to Stand: 1: +2 Total assist Details for Transfer Assistance: attempted however pt unable to accept weight through LEs to perform full extension Ambulation/Gait Ambulation/Gait Assistance: Not tested (comment)    Exercises     PT Diagnosis: Generalized weakness;Difficulty walking  PT Problem List: Decreased strength;Decreased activity tolerance;Decreased mobility;Decreased knowledge of use of DME PT Treatment Interventions: DME instruction;Gait training;Functional mobility training;Therapeutic activities;Therapeutic exercise;Patient/family education;Wheelchair mobility training   PT Goals Acute Rehab PT Goals PT Goal Formulation: With patient Time For Goal Achievement: 10/29/12 Potential to Achieve Goals: Fair Pt will go Sit to Supine/Side: with supervision PT Goal: Sit to Supine/Side - Progress: Goal set today Pt will go Sit to Stand: with supervision PT Goal: Sit to Stand - Progress: Goal set today Pt will go Stand to Sit: with supervision PT Goal: Stand to Sit - Progress: Goal set today Pt will Ambulate: 1 - 15 feet;with min assist;with rolling walker PT Goal: Ambulate - Progress: Goal set today Pt will Perform Home Exercise Program: with supervision, verbal cues required/provided PT Goal: Perform Home Exercise Program - Progress: Goal set today  Visit Information  Last PT Received On: 10/15/12 Assistance Needed: +2    Subjective Data  Subjective: I'll try.  (up to chair)   Prior Functioning  Home Living Lives With: Other (Comment) (with ex-husband) Type of Home: House Home Access: Stairs to enter [employer] of Steps: 2 Entrance Stairs-Rails: None Home Layout: One level Home Adaptive Equipment: Wheelchair - [employer] -  rolling Prior Function Level of Independence: Independent with assistive device(s) Comments: pt reports using RW for ambulation and  steps into house Communication Communication: No difficulties    Cognition  Cognition Overall Cognitive Status: Appears within functional limits for tasks assessed/performed Arousal/Alertness: Awake/alert Orientation Level: Appears intact for tasks assessed Behavior During Session: [facility] for tasks performed    Extremity/Trunk Assessment Right Lower Extremity Assessment RLE ROM/Strength/Tone: Deficits RLE ROM/Strength/Tone Deficits: grossly 2+/5 throughout per functional observation, did not test ankle RLE Sensation: Deficits RLE Sensation Deficits: WFL light touch anterior lower leg, did not test around ankle/foot bandaging Left Lower Extremity Assessment LLE ROM/Strength/Tone: Deficits LLE ROM/Strength/Tone Deficits: grossly 2+/5 throughout per functional observation   Balance    End of Session PT - End of Session Equipment Utilized During Treatment: Gait belt;Oxygen Activity Tolerance: Patient limited by fatigue Patient left: in bed;with call bell/phone within reach  GP     [facility] 10/15/2012, 11:19 AM Zenovia Jarred, PT, DPT 10/15/2012 Pager: [phone]

## 2012-10-15 NOTE — Progress Notes (Signed)
[facility] PROGRESS NOTE  [name] [name] MVH:[id number] DOB: 12-12-1945 DOA: 10/13/2012 PCP: No primary provider on file.  Brief narrative: 68 y.o. female with ischemic dilated CMP EF 25%, multivessel CAD needing CABG, DM2 uncontrolled presented to [facility] ED 10/13/2012 status post fall at home. In ED, patient was found to have gangrenous right foot/cellulitis. We have offered orthopedics consult for evaluation of right foot gangrene however patient has refused orthopedic consult and only agree to antibiotic management. In addition, evaluation in ED included x-ray of the right foot which did not reveal fractures by osteomyelitis could not be excluded. Furthermore, x-ray of left hip and thoracic spine showed no displaced fracture identified. CBC done in ED revealed hemoglobin of 9.7.   Assessment/Plan:   Principal Problem:  Gangrene of foot/ osteomyelitis  Continue vancomycin  We will get orthopedic consult for further evaluation, patient has finally agreed to have ortho involved in right foot gangrene management Active Problems:  Possible pneumonia  Continue Levaquin . DM, Type 2 IDDM  We will hold metformin and continue sliding scale insulin in the setting of acute infection CAD, 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 She needs CABG  Continue aspirin, atorvastatin Hypertension  Continue Corag, Imdur, Spironolactone IV fluids only KVO   Code Status: Full code  Family Communication: Family not at bedside  Disposition Plan: Home when stable   [name], MD  [facility]  Pager [phone]   Consultants:  Orthopedic consult 10/15/2012 Procedures:  None Antibiotics:  Vancomycin 10/13/2012 -->  Levaquin 10/13/2012 -->   If 7PM-7AM, please contact night-coverage [email or web address] Password [facility] 10/15/2012, 10:39 AM   LOS: 2 days   HPI/Subjective: No acute overnight events.  Objective: Filed Vitals:   10/15/12 0400 10/15/12 0600 10/15/12 0800 10/15/12 1000  BP: 109/68 117/68 96/76  115/69  Pulse: 76 74 79 77  Temp: 98.2 F (36.8 C)  97.7 F (36.5 C)   TempSrc: Oral     Resp: 23 21 18 24   Height:      Weight:      SpO2: 94% 96% 97% 94%    Intake/Output Summary (Last 24 hours) at 10/15/12 1039 Last data filed at 10/15/12 1000  Gross per 24 hour  Intake 4046.67 ml  Output   1200 ml  Net 2846.67 ml    Exam:   General:  Pt is alert, follows commands appropriately, not in acute distress  Cardiovascular: Regular rate and rhythm, S1/S2, no murmurs, no rubs, no gallops  Respiratory: Clear to auscultation bilaterally, no wheezing, no crackles, no rhonchi  Abdomen: Soft, non tender, non distended, bowel sounds present, no guarding  Extremities: Right foot toes gangrene; left side pulses palpable  Neuro: Grossly nonfocal  Data Reviewed: Basic Metabolic Panel:  Recent Labs Lab 10/13/12 0150 10/14/12 0347  NA 135 135  K 5.1 5.0  CL 106 106  CO2 23 22  GLUCOSE 247* 138*  BUN 24* 21  CREATININE 1.00 1.09  CALCIUM 11.4* 10.3   Liver Function Tests: No results found for this basename: AST, ALT, ALKPHOS, BILITOT, PROT, ALBUMIN,  in the last 168 hours No results found for this basename: LIPASE, AMYLASE,  in the last 168 hours No results found for this basename: AMMONIA,  in the last 168 hours CBC:  Recent Labs Lab 10/13/12 0150 10/14/12 0347  WBC 6.8 7.0  NEUTROABS 4.5  --   HGB 9.7* 9.0*  HCT 31.7* 30.4*  MCV 81.7 83.1  PLT 267 260   Cardiac Enzymes:  Recent Labs Lab 10/13/12  0150  TROPONINI <0.30   BNP: No components found with this basename: POCBNP,  CBG:  Recent Labs Lab 10/14/12 0759 10/14/12 1228 10/14/12 1656 10/14/12 2205 10/15/12 0848  GLUCAP 133* 127* 135* 114* 116*    Recent Results (from the past 240 hour(s))  MRSA PCR SCREENING     Status: None   Collection Time    10/14/12  7:00 PM      Result Value Range Status   MRSA by PCR NEGATIVE  NEGATIVE Final   Comment:            The GeneXpert MRSA Assay (FDA      approved for NASAL specimens     only), is one component of a     comprehensive MRSA colonization     surveillance program. It is not     intended to diagnose MRSA     infection nor to guide or     monitor treatment for     MRSA infections.     Studies: No results found.  Scheduled Meds: . antiseptic oral rinse  15 mL Mouth Rinse BID  . aspirin EC  81 mg Oral Daily  . atorvastatin  10 mg Oral q1800  . carvedilol  6.25 mg Oral BID WC  . famotidine  20 mg Oral Daily  . furosemide  40 mg Oral Daily  . heparin  5,000 Units Subcutaneous Q8H  . insulin aspart  0-15 Units Subcutaneous TID WC  . insulin aspart  0-5 Units Subcutaneous QHS  . isosorbide mononitrate  30 mg Oral Daily  . levofloxacin (LEVAQUIN) IV  750 mg Intravenous Q24H  . spironolactone  12.5 mg Oral Daily  . vancomycin  1,000 mg Intravenous Q12H  . Warfarin - Pharmacist Dosing Inpatient   Does not apply q1800   Continuous Infusions: . sodium chloride 10 mL/hr at 10/15/12 1030

## 2012-10-15 NOTE — Progress Notes (Signed)
ANTICOAGULATION CONSULT NOTE - Initial Consult  Pharmacy Consult for Coumadin Indication: Hx DVT, Coumadin PTA   Allergies  Allergen Reactions  . Penicillins Anaphylaxis  . Sulfa Antibiotics Anaphylaxis   Labs:  Recent Labs  10/13/12 0150 10/14/12 0347 10/15/12 0342  HGB 9.7* 9.0*  --   HCT 31.7* 30.4*  --   PLT 267 260  --   LABPROT 16.2*  --  16.0*  INR 1.33  --  1.31  CREATININE 1.00 1.09  --   TROPONINI <0.30  --   --     Estimated Creatinine Clearance: 57.1 ml/min (by C-G formula based on Cr of 1.09).  Medical History: Past Medical History  Diagnosis Date  . 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 Diabetes mellitus type 2, uncontrolled, with complications     Very poorly controlled  . Coronary artery disease     Severe Multivessel Disase  . DVT, bilateral lower limbs 05/2012  . 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 On Coumadin 5 mg daily PTA for hx DVT. Coumadin held on admission in anticipation of surgery and heparin sq for vte ppx started. Pt refused ortho consult and amputation so Coumadin was resumed last night; however, today patient has now agreed to continue with amputation and procedure is scheduled for tomorrow.    INR 1.31  Goal of Therapy:  INR 2-3   Plan:  Hold warfarin for planned surgery tomorrow.   Hold AM dose of SQ Heparin in AM prior to surgery. F/u plan to resume anticoagulation following surgery.  Clance Boll, PharmD, BCPS Pager: [phone] 10/15/2012 1:09 PM

## 2012-10-15 NOTE — Progress Notes (Signed)
Patient transferring to room 1317.  Report called to [place], Charity fundraiser.  Patient to travel by bed.  Will continue to monitor.

## 2012-10-15 NOTE — Consult Note (Addendum)
I have seen and evaluated the patient this Saturday PM along with Wilburt Finlay, PA. I agree with his findings, examination as well as impression recommendations.  Were called to see this 67 year old [name] [name], who is well known to [place] from 2 previous admissions in October and November of last year. Unfortunately she has not been seen in our clinic since her last hospitalization. 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 She did have a cardiac MRI which showed viable territory in the LAD distribution, and the plans for to be referred to see Dr. Kathlee Nations Trigt for CABG.  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 Hart Rochester in consultation from vascular surgery with plans for outpatient followup, that never occurred.. She states she's been taking her Coumadin but has not had an INR checked since her discharge. Her INR was subtherapeutic upon arrival of February 20 when she presented likely septic from a gangrenous right toe. Consultations during these admissions included: [facility], Vascular Surgery--Dr. Jerilee Field, CVTS -- Dr. Kathlee Nations Trigt.   She received a significant IV fluids for hypotension, and her pressures are now relatively stable however she now has worsening pitting edema.  Orthopedic surgery was consult and to consider transmetatarsal amputation of the gangrenous toe to remove the infectious/septic source.  Dr. Magnus Ivan is planning to proceed with surgery tomorrow given the degree of necrosis. The patient was concerned given her cardiac history and insisted that we were contacted.   From a cardiac standpoint, she would be considered to be a High-Risk patient for any  procedure with an EF of 30-35% and non-revascularized multivessel coronary disease, diabetes on insulin. Unfortunately her surgery is not elective in his computer urgent to emergent with a plan to being done on a Sunday.  Prior to her current morbid condition, she did not appear to be in decompensated heart prior, and was not having angina. She is on a stable dose of beta blocker and nitrate for afterload reduction with stable pressures.  She Is a High-Risk patient for a low risk surgery.  -  > 11% risk of fatal or nonfatal MI, cardiac arrest (VT/VF), D., heart failure, complete heart block, and atrial fibrillation.  Would strongly consider spinal as opposed to general anesthesia.  Some risk is mitigated by the presence of standing beta blocker dose and nitrate.  In addition to patient interview and examination, I spent greater than 45 minutes in consultation with the patient and her daughter discussing her medical conditions, her current condition and risks/pros and cons of surgery as well as her cardiac risk for noncardiac surgery. She understands that she is high-risk, but also understands that we are now in a relatively urgent situation. I also went into great detail trying to figure the best way for her to get followup and understand why she did not followup for November.  I don't think we have any options besides proceeding with this procedure, and certainly doing any further evaluation now would do nothing but delay her necessary operation.  We will be standing by if necessary for assistance postoperatively.  As for her peripheral vascular disease, her lower extremity anatomy has not been evaluated since her November admission.  I would recommend  as part of her postoperative is not preoperative evaluation that consideration be made to evaluate her vascular anatomy to ensure adequate healing of her remaining foot.  She'll also need close monitoring of her glucose levels.  She may very well  require diuresis postoperatively, however are reluctant to do so preoperatively for fear of exacerbating her recent hypotension.  Continue carvedilol, Imdur, spironolactone and standing Lasix dose for now.  Would transfer to Telemetry floor post-operatively given her PMH.  On discharge, we will need to ensure that she has followup with Dr. Tresa Endo scheduled at our [place] office.   Time spent with patient: 60 minutes, with chart 20 minutes  Aseel Uhde W, M.D., M.S. [facility] [address] [place], [place]  [address]  [phone] Pager # [phone] 10/15/2012 8:10 PM

## 2012-10-16 ENCOUNTER — Encounter (HOSPITAL_COMMUNITY)
Admission: EM | Disposition: A | Discharge status: Home Health | Payer: Self-pay | Source: Home / Self Care | Attending: Internal Medicine

## 2012-10-16 ENCOUNTER — Encounter (HOSPITAL_COMMUNITY): Payer: Self-pay | Admitting: *Deleted

## 2012-10-16 ENCOUNTER — Inpatient Hospital Stay (HOSPITAL_COMMUNITY): Payer: Medicare Other | Admitting: *Deleted

## 2012-10-16 DIAGNOSIS — I96 Gangrene, not elsewhere classified: Secondary | ICD-10-CM

## 2012-10-16 DIAGNOSIS — I82409 Acute embolism and thrombosis of unspecified deep veins of unspecified lower extremity: Secondary | ICD-10-CM

## 2012-10-16 HISTORY — DX: Gangrene, not elsewhere classified: I96

## 2012-10-16 HISTORY — PX: AMPUTATION: SHX166

## 2012-10-16 LAB — CBC
HCT: 31.6 % — ABNORMAL LOW (ref 36.0–46.0)
Hemoglobin: 9.6 g/dL — ABNORMAL LOW (ref 12.0–15.0)
MCH: 24.9 pg — ABNORMAL LOW (ref 26.0–34.0)
MCHC: 30.4 g/dL (ref 30.0–36.0)
MCV: 81.9 fL (ref 78.0–100.0)
RDW: 18.6 % — ABNORMAL HIGH (ref 11.5–15.5)

## 2012-10-16 LAB — BASIC METABOLIC PANEL
BUN: 21 mg/dL (ref 6–23)
Calcium: 10.4 mg/dL (ref 8.4–10.5)
Creatinine, Ser: 1.15 mg/dL — ABNORMAL HIGH (ref 0.50–1.10)
GFR calc Af Amer: 56 mL/min — ABNORMAL LOW (ref >90)
GFR calc non Af Amer: 48 mL/min — ABNORMAL LOW (ref >90)
Glucose, Bld: 119 mg/dL — ABNORMAL HIGH (ref 70–99)
Potassium: 3.8 mEq/L (ref 3.5–5.1)

## 2012-10-16 LAB — GLUCOSE, CAPILLARY
Glucose-Capillary: 116 mg/dL — ABNORMAL HIGH (ref 70–99)
Glucose-Capillary: 138 mg/dL — ABNORMAL HIGH (ref 70–99)
Glucose-Capillary: 116 mg/dL — ABNORMAL HIGH (ref 70–99)

## 2012-10-16 SURGERY — AMPUTATION, FOOT, PARTIAL
Anesthesia: General | Site: Foot | Laterality: Right | Wound class: Dirty or Infected

## 2012-10-16 MED ORDER — FUROSEMIDE 40 MG PO TABS
40.0000 mg | ORAL_TABLET | Freq: Two times a day (BID) | ORAL | Status: DC
  Administered 2012-10-16 – 2012-10-17 (×4): 40 mg via ORAL
  Filled 2012-10-16 (×5): qty 1

## 2012-10-16 MED ORDER — CARVEDILOL 6.25 MG PO TABS
6.2500 mg | ORAL_TABLET | Freq: Two times a day (BID) | ORAL | Status: DC
  Administered 2012-10-16 – 2012-10-20 (×9): 6.25 mg via ORAL
  Filled 2012-10-16 (×12): qty 1

## 2012-10-16 MED ORDER — LACTATED RINGERS IV SOLN: INTRAVENOUS | Status: DC

## 2012-10-16 MED ORDER — SPIRONOLACTONE 12.5 MG HALF TABLET
12.5000 mg | ORAL_TABLET | Freq: Every day | ORAL | Status: DC
  Administered 2012-10-16 – 2012-10-20 (×5): 12.5 mg via ORAL
  Filled 2012-10-16 (×5): qty 1

## 2012-10-16 MED ORDER — PHENYLEPHRINE HCL 10 MG/ML IJ SOLN
INTRAMUSCULAR | Status: DC | PRN
  Administered 2012-10-16: 40 ug via INTRAVENOUS
  Administered 2012-10-16: 20 ug via INTRAVENOUS

## 2012-10-16 MED ORDER — WARFARIN SODIUM 7.5 MG PO TABS
7.5000 mg | ORAL_TABLET | Freq: Once | ORAL | Status: AC
  Administered 2012-10-16: 7.5 mg via ORAL
  Filled 2012-10-16: qty 1

## 2012-10-16 MED ORDER — MIDAZOLAM HCL 5 MG/5ML IJ SOLN
INTRAMUSCULAR | Status: DC | PRN
  Administered 2012-10-16 (×2): .25 mg via INTRAVENOUS

## 2012-10-16 MED ORDER — LIDOCAINE HCL 1 % IJ SOLN
INTRAMUSCULAR | Status: DC | PRN
  Administered 2012-10-16: 20 mL

## 2012-10-16 MED ORDER — EPHEDRINE SULFATE 50 MG/ML IJ SOLN
INTRAMUSCULAR | Status: DC | PRN
  Administered 2012-10-16: 10 mg via INTRAVENOUS

## 2012-10-16 MED ORDER — PROPOFOL 10 MG/ML IV BOLUS
INTRAVENOUS | Status: DC | PRN
  Administered 2012-10-16 (×2): 10 mg via INTRAVENOUS

## 2012-10-16 MED ORDER — FAMOTIDINE 20 MG PO TABS
20.0000 mg | ORAL_TABLET | Freq: Every day | ORAL | Status: DC
  Administered 2012-10-16 – 2012-10-20 (×5): 20 mg via ORAL
  Filled 2012-10-16 (×5): qty 1

## 2012-10-16 MED ORDER — BIOTENE DRY MOUTH MT LIQD
15.0000 mL | Freq: Two times a day (BID) | OROMUCOSAL | Status: DC
  Administered 2012-10-16 – 2012-10-20 (×8): 15 mL via OROMUCOSAL

## 2012-10-16 MED ORDER — PROPOFOL 10 MG/ML IV BOLUS: INTRAVENOUS | Status: DC | PRN

## 2012-10-16 MED ORDER — HYDROMORPHONE HCL PF 1 MG/ML IJ SOLN: 0.2500 mg | INTRAMUSCULAR | Status: DC | PRN

## 2012-10-16 MED ORDER — ISOSORBIDE MONONITRATE ER 30 MG PO TB24
30.0000 mg | ORAL_TABLET | Freq: Every day | ORAL | Status: DC
  Administered 2012-10-16 – 2012-10-20 (×5): 30 mg via ORAL
  Filled 2012-10-16 (×5): qty 1

## 2012-10-16 MED ORDER — FENTANYL CITRATE 0.05 MG/ML IJ SOLN
INTRAMUSCULAR | Status: DC | PRN
  Administered 2012-10-16 (×2): 25 ug via INTRAVENOUS

## 2012-10-16 SURGICAL SUPPLY — 16 items
BANDAGE GAUZE ELAST BULKY 4 IN (GAUZE/BANDAGES/DRESSINGS) ×1 IMPLANT
BLADE MIC 41X13 (BLADE) ×1 IMPLANT
BNDG COHESIVE 4X5 TAN STRL (GAUZE/BANDAGES/DRESSINGS) ×1 IMPLANT
CLOTH BEACON ORANGE TIMEOUT ST (SAFETY) ×2 IMPLANT
DRSG XEROFORM 1X8 (GAUZE/BANDAGES/DRESSINGS) ×1 IMPLANT
GLOVE BIO SURGEON STRL SZ7.5 (GLOVE) ×2 IMPLANT
GOWN STRL REIN XL XLG (GOWN DISPOSABLE) ×2 IMPLANT
KIT BASIN OR (CUSTOM PROCEDURE TRAY) ×1 IMPLANT
NEEDLE HYPO 22GX1.5 SAFETY (NEEDLE) ×2 IMPLANT
PACK LOWER EXTREMITY WL (CUSTOM PROCEDURE TRAY) ×1 IMPLANT
POSITIONER SURGICAL ARM (MISCELLANEOUS) ×2 IMPLANT
SPONGE GAUZE 4X4 12PLY (GAUZE/BANDAGES/DRESSINGS) ×1 IMPLANT
SUT ETHILON 2 0 PS N (SUTURE) ×3 IMPLANT
SYR 20CC LL (SYRINGE) ×3 IMPLANT
TOWEL OR 17X26 10 PK STRL BLUE (TOWEL DISPOSABLE) ×4 IMPLANT
TOWEL OR NON WOVEN STRL DISP B (DISPOSABLE) ×1 IMPLANT

## 2012-10-16 NOTE — Progress Notes (Addendum)
ANTICOAGULATION and ANTIBIOTIC CONSULT NOTE - FOLLOW UP  Pharmacy Consult for Coumadin Indication: Hx DVT, Coumadin PTA   Pharmacy Consult for Vancomycin/Levaquin Indication: necrotic R foot cellulitis, PNA  Allergies  Allergen Reactions  . Penicillins Anaphylaxis  . Sulfa Antibiotics Anaphylaxis   Labs:  Recent Labs  10/14/12 0347 10/15/12 0342 10/16/12 0431  HGB 9.0*  --  9.6*  HCT 30.4*  --  31.6*  PLT 260  --  246  LABPROT  --  16.0* 16.3*  INR  --  1.31 1.34  CREATININE 1.09  --  1.15*    Estimated Creatinine Clearance: 53.9 ml/min (by C-G formula based on Cr of 1.15).  Medical History: Past Medical History  Diagnosis Date  . 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 Diabetes mellitus type 2, uncontrolled, with complications     Very poorly controlled  . Coronary artery disease     Severe Multivessel Disase; 3 V Disease wtih LAD distribution Viability &  inferior scar.  . DVT, bilateral lower limbs 05/2012  . 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 On Coumadin 5 mg daily PTA for hx DVT. Coumadin held on admission in anticipation of surgery and heparin sq for vte ppx started. Pt now post-op amputation today, restarting warfarin.  Drug interaction with Levaquin noted.  Day #4 vancomycin 1g IV q12h for necrotic right foot, likely osteomyelitis.  Patient now post-op transmetatarsal amputation and MD recommending 1-2 additional days of vancomycin post-op.  Day #4 empiric Levaquin 750 mg IV q24h for possible pneumonia.  CrCl~53 ml/min.   Goal of Therapy:  INR 2-3 Vancomycin trough 15-20   Plan:  Restart warfarin with 7.5 mg tonight.   Daily PT/INR. F/u length of treatment recommendations for antibiotics.  Clance Boll, PharmD, BCPS Pager: [phone] 10/16/2012 5:10 PM

## 2012-10-16 NOTE — Op Note (Signed)
[name] [name], [name] [name] NO.:  [id number]  MEDICAL RECORD NO.:  [id number]  LOCATION:  [facility]                         FACILITY:  [facility]  PHYSICIAN:  Vanita Panda. Magnus Ivan, M.D.DATE OF BIRTH:  08-18-1946  DATE OF PROCEDURE:  10/16/2012 DATE OF DISCHARGE:                              OPERATIVE REPORT   PREOPERATIVE DIAGNOSIS:  Right forefoot ischemia with necrosis and dry gangrene.  POSTOPERATIVE DIAGNOSIS:  Right forefoot ischemia with necrosis and dry gangrene.  PROCEDURE:  Right foot transmetatarsal amputation.  SURGEON:  Vanita Panda. Magnus Ivan, M.D.  ANESTHESIA: 1. __________ popliteal block. 2. Right ankle block. 3. Local with 1% plain lidocaine. 4. General anesthesia.  BLOOD LOSS:  Minimal.  COMPLICATIONS:  None.  INDICATIONS:  [name] is a 67 year old female in poor health.  She is diabetic.  She has significant heart disease, peripheral edema, had history of DVTs and developed right foot ischemia.  She had a wide area of necrosis involving her first, second, and third toes and the dorsum of the foot in general.  At this point, this is starting to make her sick.  I have talked to her and her daughter in detail, and they understand the need to proceed with a right foot transmetatarsal amputation due to the extent of the necrosis.  We are attempting to do this under block and regional anesthesia with local and __________ IV sedation.  PROCEDURE DESCRIPTION:  After informed consent was obtained, appropriate right foot was marked.  Anesthesia first attempted at popliteal block was not successful due to severe edema and her calcified vessels.  They then proceeded with an ankle block and brought her to the operating room.  She was placed supine on the operating table.  Her right foot was prepped and draped with DuraPrep and sterile drapes.  Time-out was called.  She was identified as correct patient, correct right foot.  I then used local anesthesia  around the foot, but they started having problems in maintaining her airway, so she was __________ at the start of the case until intubation __________ LMA could be done.  She had been stabilized and they felt we could proceed with the case.  I used #10 blade to cut around the foot at the demarcated margins.  I then cleaned the metatarsals and used __________ cut each metatarsal.  I then used the knife to finish off the amputation preserving a plantar based flap. I was able to bring that tissue from plantar to dorsum in a good flap. I did use the local Esmarch __________ tourniquet.  I let this down quickly but did not get much bleeding at all.  I then irrigated the tissues with normal saline solution and reapproximated the flap with interrupted 2-0 nylon suture.  I placed Xeroform and well-padded sterile dressing.  She was awakened, extubated, and taken to recovery room in stable condition.  All final counts were correct.  There were no complications noted.     Vanita Panda. Magnus Ivan, M.D.     CYB/MEDQ  D:  10/16/2012  T:  10/16/2012  Job:  [id number]

## 2012-10-16 NOTE — Transfer of Care (Signed)
Immediate Anesthesia Transfer of Care Note  Patient: [name] [name]  Procedure(s) Performed: Procedure(s) with comments: AMPUTATION FOOT (Right) - transmetatarsal amp  Patient Location: PACU  Anesthesia Type:GA combined with regional for post-op pain  Level of Consciousness: awake, alert , oriented, patient cooperative and responds to stimulation  Airway & Oxygen Therapy: Patient Spontanous Breathing and Patient connected to face mask oxygen  Post-op Assessment: Report given to PACU RN, Post -op Vital signs reviewed and stable and Patient moving all extremities  Post vital signs: Reviewed and stable  Complications: No apparent anesthesia complications

## 2012-10-16 NOTE — Preoperative (Signed)
Beta Blockers   Reason not to administer Beta Blockers:Not Applicable 

## 2012-10-16 NOTE — Anesthesia Preprocedure Evaluation (Addendum)
Anesthesia Evaluation  Patient identified by MRN, date of birth, ID band Patient awake    Reviewed: Allergy & Precautions, H&P , NPO status , Patient's Chart, lab work & pertinent test results  Airway Mallampati: III TM Distance: <3 FB Neck ROM: Full    Dental  (+) Teeth Intact, Poor Dentition, Partial Upper and Dental Advisory Given   Pulmonary shortness of breath, former smoker,  breath sounds clear to auscultation  Pulmonary exam normal       Cardiovascular + CAD (EF 25%; severe 3 vessel CAD), + Peripheral Vascular Disease, +CHF and DVT + dysrhythmias Atrial Fibrillation Rhythm:Regular Rate:Normal     Neuro/Psych negative neurological ROS  negative psych ROS   GI/Hepatic negative GI ROS, Neg liver ROS,   Endo/Other  diabetes, Poorly Controlled, Type 2, Oral Hypoglycemic Agents  Renal/GU Renal diseasenegative Renal ROS  negative genitourinary   Musculoskeletal negative musculoskeletal ROS (+)   Abdominal   Peds  Hematology negative hematology ROS (+) Elevated PTT   Anesthesia Other Findings   Reproductive/Obstetrics negative OB ROS                          Anesthesia Physical Anesthesia Plan  ASA: III and emergent  Anesthesia Plan: General   Post-op Pain Management:    Induction: Intravenous  Airway Management Planned: LMA  Additional Equipment:   Intra-op Plan:   Post-operative Plan: Extubation in OR  Informed Consent: I have reviewed the patients History and Physical, chart, labs and discussed the procedure including the risks, benefits and alternatives for the proposed anesthesia with the patient or authorized representative who has indicated his/her understanding and acceptance.   Dental advisory given  Plan Discussed with: CRNA  Anesthesia Plan Comments:         Anesthesia Quick Evaluation

## 2012-10-16 NOTE — Progress Notes (Addendum)
[facility] PROGRESS NOTE  [name] [name] AVW:[id number] DOB: 11-21-1945 DOA: 10/13/2012 PCP: No primary provider on file.  Brief narrative: 67 y.o. female with ischemic dilated CMP EF 25%, multivessel CAD needing CABG, DM2 uncontrolled presented to [facility] long ED 10/13/2012 status post fall at home. In ED, patient was found to have gangrenous right foot/cellulitis. We have offered orthopedics consult for evaluation of right foot gangrene however patient has initially refused orthopedic consult and only agreed to antibiotic management. Patient now in agreement for ortho consultation. In addition, evaluation in ED included x-ray of the right foot which did not reveal fractures by osteomyelitis could not be excluded. Furthermore, x-ray of left hip and thoracic spine showed no displaced fracture identified. CBC done in ED revealed hemoglobin of 9.7.    Assessment/Plan:   Principal Problem:  Gangrene of foot/ osteomyelitis  Continue vancomycin  Appreciate orthopedic consult, plan for surgery today Active Problems:  Possible pneumonia  Continue Levaquin . DM, Type 2 IDDM  Continue sliding scale insulin CAD, 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  Continue aspirin, atorvastatin Appreciate cardiology consult Hypertension  Continue Corag, Imdur, Spironolactone  IV fluids only KVO H/O DVT  Coumadin per pharmacy; on hold due to surgery  Code Status: Full code  Family Communication: Family not at bedside  Disposition Plan: Home when stable   [name], MD  [facility]  Pager [phone]   Consultants:  Orthopedic consult 10/15/2012 Cardiology (for preoperative clearance) Procedures:  Surgery planned for today Antibiotics:  Vancomycin 10/13/2012 -->  Levaquin 10/13/2012 -->     If 7PM-7AM, please contact night-coverage [email or web address] Password [facility] 10/16/2012, 6:47 AM   LOS: 3 days    HPI/Subjective: No acute overnight events.  Objective: Filed Vitals:   10/15/12 1000 10/15/12  1200 10/15/12 1431 10/15/12 2106  BP: 115/69 114/69 119/76 109/67  Pulse: 77 78 79 78  Temp:  97.8 F (36.6 C) 98.3 F (36.8 C) 97.5 F (36.4 C)  TempSrc:  Oral Oral Oral  Resp: 24 20 20 21   Height:      Weight:  88.225 kg (194 lb 8 oz)    SpO2: 94% 100% 99% 98%    Intake/Output Summary (Last 24 hours) at 10/16/12 0647 Last data filed at 10/15/12 1852  Gross per 24 hour  Intake    645 ml  Output   1000 ml  Net   -355 ml    Exam:   General:  Pt is alert, follows commands appropriately, not in acute distress  Cardiovascular: Regular rate and rhythm, S1/S2, no murmurs, no rubs, no gallops  Respiratory: Clear to auscultation bilaterally, no wheezing, no crackles, no rhonchi  Abdomen: Soft, non tender, non distended, bowel sounds present, no guarding  Extremities: right foot gangrene, left foot pulses palpable  Neuro: Grossly nonfocal  Data Reviewed: Basic Metabolic Panel:  Recent Labs Lab 10/13/12 0150 10/14/12 0347 10/16/12 0431  NA 135 135 134*  K 5.1 5.0 3.8  CL 106 106 105  CO2 23 22 21   GLUCOSE 247* 138* 119*  BUN 24* 21 21  CREATININE 1.00 1.09 1.15*  CALCIUM 11.4* 10.3 10.4   Liver Function Tests: No results found for this basename: AST, ALT, ALKPHOS, BILITOT, PROT, ALBUMIN,  in the last 168 hours No results found for this basename: LIPASE, AMYLASE,  in the last 168 hours No results found for this basename: AMMONIA,  in the last 168 hours CBC:  Recent Labs Lab 10/13/12 0150 10/14/12 0347 10/16/12 0431  WBC 6.8 7.0 9.3  NEUTROABS 4.5  --   --   HGB 9.7* 9.0* 9.6*  HCT 31.7* 30.4* 31.6*  MCV 81.7 83.1 81.9  PLT 267 260 246   Cardiac Enzymes:  Recent Labs Lab 10/13/12 0150  TROPONINI <0.30   BNP: No components found with this basename: POCBNP,  CBG:  Recent Labs Lab 10/14/12 1656 10/14/12 2205 10/15/12 0848 10/15/12 1655 10/15/12 2124  GLUCAP 135* 114* 116* 150* 133*    Recent Results (from the past 240 hour(s))  MRSA PCR  SCREENING     Status: None   Collection Time    10/14/12  7:00 PM      Result Value Range Status   MRSA by PCR NEGATIVE  NEGATIVE Final     Studies: No results found.  Scheduled Meds: . antiseptic oral rinse  15 mL Mouth Rinse BID  . aspirin EC  81 mg Oral Daily  . atorvastatin  10 mg Oral q1800  . carvedilol  6.25 mg Oral BID WC  . famotidine  20 mg Oral Daily  . furosemide  40 mg Oral BID  . insulin aspart  0-15 Units Subcutaneous TID WC  . insulin aspart  0-5 Units Subcutaneous QHS  . isosorbide mononitrate  30 mg Oral Daily  . levofloxacin (LEVAQUIN) IV  750 mg Intravenous Q24H  . spironolactone  12.5 mg Oral Daily  . vancomycin  1,000 mg Intravenous Q12H  . Warfarin - Pharmacist Dosing Inpatient   Does not apply q1800   Continuous Infusions: . sodium chloride 10 mL/hr at 10/15/12 1030

## 2012-10-16 NOTE — Anesthesia Procedure Notes (Signed)
Anesthesia Regional Block:  Ankle block  Pre-Anesthetic Checklist: ,, timeout performed, Correct Patient, Correct Site, Correct Laterality, Correct Procedure, Correct Position, site marked, Risks and benefits discussed,  Surgical consent,  Pre-op evaluation,  At surgeon's request and post-op pain management  Laterality: Right  Prep: chloraprep       Needles:       Needle Gauge: 25 and 25 G    Additional Needles: Ankle block Narrative:  Start time: 10/16/2012 8:10 AM End time: 10/16/2012 8:29 AM Injection made incrementally with aspirations every 5 mL.  Performed by: Personally  Anesthesiologist: Lucille Passy M  Additional Notes: 20 cc of 2% lidocaine with Epi 1:000,000 injected in 5 cc volume right ankle. Attempt at popliteal block without success due extensive dependent edema and inability to visualize popliteal artery and nerve.  Ankle block

## 2012-10-16 NOTE — Brief Op Note (Signed)
10/13/2012 - 10/16/2012  9:26 AM  PATIENT:  [name] [name]  67 y.o. female  PRE-OPERATIVE DIAGNOSIS:  gangrene necrosed right foot  POST-OPERATIVE DIAGNOSIS:  gangrene necrosed right foot  PROCEDURE:  Procedure(s) with comments: AMPUTATION FOOT (Right) - transmetatarsal amp  SURGEON:  Surgeon(s) and Role:    * Kathryne Hitch, MD - Primary  PHYSICIAN ASSISTANT:   ASSISTANTS: none   ANESTHESIA:   local, regional and general  EBL:     BLOOD ADMINISTERED:none  DRAINS: none   LOCAL MEDICATIONS USED:  LIDOCAINE   SPECIMEN:  Source of Specimen:  right forefoot  DISPOSITION OF SPECIMEN:  PATHOLOGY  COUNTS:  YES  TOURNIQUET:   Total Tourniquet Time Documented: area (Right) - 6 minutes Total: area (Right) - 6 minutes   DICTATION: .Other Dictation: Dictation Number [id number]  PLAN OF CARE: Admit to inpatient   PATIENT DISPOSITION:  PACU - hemodynamically stable.   Delay start of Pharmacological VTE agent (>24hrs) due to surgical blood loss or risk of bleeding: no

## 2012-10-17 ENCOUNTER — Encounter (HOSPITAL_COMMUNITY): Payer: Self-pay | Admitting: Orthopaedic Surgery

## 2012-10-17 DIAGNOSIS — I5042 Chronic combined systolic (congestive) and diastolic (congestive) heart failure: Secondary | ICD-10-CM

## 2012-10-17 DIAGNOSIS — D638 Anemia in other chronic diseases classified elsewhere: Secondary | ICD-10-CM

## 2012-10-17 LAB — PROTIME-INR
INR: 1.33 (ref 0.00–1.49)
Prothrombin Time: 16.2 seconds — ABNORMAL HIGH (ref 11.6–15.2)

## 2012-10-17 LAB — GLUCOSE, CAPILLARY

## 2012-10-17 MED ORDER — WARFARIN SODIUM 5 MG PO TABS
5.0000 mg | ORAL_TABLET | Freq: Once | ORAL | Status: AC
  Administered 2012-10-17: 5 mg via ORAL
  Filled 2012-10-17: qty 1

## 2012-10-17 MED ORDER — FUROSEMIDE 80 MG PO TABS
80.0000 mg | ORAL_TABLET | Freq: Two times a day (BID) | ORAL | Status: DC
  Administered 2012-10-18 – 2012-10-20 (×6): 80 mg via ORAL
  Filled 2012-10-17 (×7): qty 1

## 2012-10-17 MED ORDER — FUROSEMIDE 40 MG PO TABS
40.0000 mg | ORAL_TABLET | Freq: Once | ORAL | Status: AC
  Administered 2012-10-17: 40 mg via ORAL
  Filled 2012-10-17: qty 1

## 2012-10-17 NOTE — Progress Notes (Signed)
ANTICOAGULATION CONSULT NOTE - FOLLOW UP  Pharmacy Consult for Coumadin Indication: Hx DVT, Coumadin PTA    Allergies  Allergen Reactions  . Penicillins Anaphylaxis  . Sulfa Antibiotics Anaphylaxis   Labs:  Recent Labs  10/15/12 0342 10/16/12 0431 10/17/12 0408  HGB  --  9.6*  --   HCT  --  31.6*  --   PLT  --  246  --   LABPROT 16.0* 16.3* 16.2*  INR 1.31 1.34 1.33  CREATININE  --  1.15*  --     Estimated Creatinine Clearance: 53.9 ml/min (by C-G formula based on Cr of 1.15).  Medical History: Past Medical History  Diagnosis Date  . 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 Diabetes mellitus type 2, uncontrolled, with complications     Very poorly controlled  . Coronary artery disease     Severe Multivessel Disase; 3 V Disease wtih LAD distribution Viability &  inferior scar.  . DVT, bilateral lower limbs 05/2012  . 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 On Coumadin 5 mg daily PTA for hx DVT. Coumadin held on admission in anticipation of surgery and heparin sq for vte ppx started. Pt initially refused amputation so Coumadin was resumed 2/21. Agreed to amputation 2/22 so Coumadin held. Surgery was 2/23 and Coumadin resumed post op. INR tx on admission but became subtx 2/20   INR remains subtx after 5mg , 0, 7.5mg  No bleeding reported  Pt on Levaquin for possible pna - may increase INR/decrease Coumadin requirements  Goal of Therapy:  INR 2-3  Plan:  Coumadin 5mg  tonight Daily PT/INR Defer decision to bridge w/ parenteral agent to MD  Gwen Her PharmD  [phone] 10/17/2012 8:02 AM

## 2012-10-17 NOTE — Progress Notes (Signed)
  Echocardiogram 2D Echocardiogram has been performed.  [name] [name] 10/17/2012, 2:14 PM

## 2012-10-17 NOTE — Progress Notes (Signed)
Subjective:  Feels great.  Did well post-op Had some bleeding from the foot after moving around some.  Objective:  Vital Signs in the last 24 hours: Temp:  [97.8 F (36.6 C)-98.6 F (37 C)] 97.8 F (36.6 C) (02/24 2026) Pulse Rate:  [69-83] 83 (02/24 2026) Resp:  [16-18] 16 (02/24 2026) BP: (102-106)/(59-66) 105/59 mmHg (02/24 2026) SpO2:  [93 %-96 %] 93 % (02/24 2026)  Intake/Output from previous day: 02/23 0701 - 02/24 0700 In: 890 [P.O.:240; I.V.:650] Out: -  Intake/Output from this shift: Total I/O In: 120 [P.O.:120] Out: 400 [Urine:400]  Physical Exam: General appearance: alert, cooperative and no distress Neck: no adenopathy, no carotid bruit and no JVD Lungs: rales bilaterally and mild; diffuse.  Non-labored, no wheeezing Heart: regular rate and rhythm, S1, S2 normal and S4 present Abdomen: soft, non-tender; bowel sounds normal; no masses,  no organomegaly and obese Extremities: edema 2-3+ and R foor dressing C/D/I -- no longer has foul odor. Pulses: decreased pulses in R leg  Lab Results:  Recent Labs  10/16/12 0431  WBC 9.3  HGB 9.6*  PLT 246    Recent Labs  10/16/12 0431  NA 134*  K 3.8  CL 105  CO2 21  GLUCOSE 119*  BUN 21  CREATININE 1.15*    Assessment/Plan:  Arrhythmia Cardiomyopathy Coronary Artery Disease Edema Vascular Disease  She did well with her surgery -- which was necessary.  With her hypotension on arrival, she was hydrated aggressively -- while she is not complaining of SOB -- she is chronically orthopneic & her EF by this echo is even worse than before.  I have increased her Lasix to 80mg  bid.  She is on a good BB dose & Aldactone, but her BP is not currently high enough to tolerate an ACE-I.  She is back on Xarelto.   Will need to closely monitor the stump for healing with unclear peripheral anatomy.    LOS: 4 days    HARDING,DAVID W 10/17/2012, 1745 PM

## 2012-10-17 NOTE — Progress Notes (Addendum)
[facility] PROGRESS NOTE  [name] [name]:[id number] DOB: Mar 30, 1946 DOA: 10/13/2012 PCP: No primary provider on file.  Brief narrative: 67 y.o. 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 In ED, patient was found to have gangrenous right foot and possible osteomyelitis. In addition, evaluation in ED included x-ray of the right foot which did not reveal fractures by osteomyelitis could not be excluded. Furthermore, x-ray of left hip and thoracic spine showed no displaced fracture identified. CBC done in ED revealed hemoglobin of 9.7. Patient initially refused orthopedics evaluation and after a few days finally agreed to a consult. Patient is now status post a right transmetatarsal amputation done 10/16/2012 with no subsequent complications.  Assessment/Plan:   Principal Problem:  Right foot gangrene and osteomyelitis Status post right transmetatarsal amputation in 10/16/2012 with no subsequent complications Patient already received 5 days of vancomycin. No fever and no elevated white blood cell count. We will discontinue vancomycin today. Appreciate orthopedic surgery following Will followup with PT evaluation Active Problems:  Possible community-acquired pneumonia  Patient took Levaquin for 5 days. Respiratory status is stable. We will discontinue Levaquin today. DM, Type 2 IDDM, uncontrolled with complications A1c 8.8 on this admission. Appreciate diabetic coordinator education. Continue sliding scale insulin CBGs in last 24 hours: 138, 116, 150 Chronic combined systolic and diastolic congestive heart failure BNP on this admission is 19,285 and last 2D echo is in November 2013 with ejection fraction of 35% Cardiology was consulted for preoperative evaluation. For now we will continue Lasix 40 mg twice a day, spironolactone 12.5 mg  daily. Continue Imdur and Coreg Followup 2-D echo on this admission Strict intake and output, daily weight Severe coronary artery disease, 3 vessel disease  Continue aspirin, Lipitor Hypertension  Blood pressure 102/60 Continue Imdur 30 mg daily, Corag 6.25 mg twice daily Chronic kidney disease, stage III  Creatinine 1.15 today. Patient had creatinine 1.51 in 07/2012  Continue to monitor renal function Anemia of chronic disease  Likely secondary to CKD and CHF  Hemoglobin 9.6. Continue to monitor CBC. History of popliteal DVT  We will continue Coumadin per pharmacy  Code Status: Full code  Family Communication: Family not at bedside  Disposition Plan: Home when stable; needs PT evaluation  [name], MD  [facility]  Pager [phone]   Consultants:  Orthopedic consult 10/15/2012  Cardiology (for preoperative clearance) Procedures:  Right transmetatarsal amputation 10/16/2012 Antibiotics:  Vancomycin 10/13/2012 --> 10/17/2012 Levaquin 10/13/2012 -->10/17/2012   If 7PM-7AM, please contact night-coverage [email or web address] Password [email or web address] 10/17/2012, 10:09 AM   LOS: 4 days    HPI/Subjective: No acute overnight events.  Objective: Filed Vitals:   10/16/12 1041 10/16/12 1406 10/16/12 2158 10/17/12 0539  BP: 116/72 115/63 99/59 102/60  Pulse: 75 74 69 69  Temp: 98.2 F (36.8 C) 98.1 F (36.7 C) 97.5 F (36.4 C) 98 F (36.7 C)  TempSrc:  Oral Oral Oral  Resp: 16 16 16 18   Height:      Weight:      SpO2: 96% 96% 99% 96%    Intake/Output Summary (Last 24 hours) at 10/17/12 1009 Last data filed at 10/16/12 1100  Gross per 24 hour  Intake    390 ml  Output      0 ml  Net    390 ml    Exam:   General:  Pt is alert, follows commands appropriately, not  in acute distress  Cardiovascular: Regular rate and rhythm, S1/S2 appreciated  Respiratory: Clear to auscultation bilaterally, no wheezing, no crackles, no rhonchi  Abdomen: Soft, non tender, non distended, bowel  sounds present, no guarding  Extremities: Right transmetatarsal amputation, left pedal pulses palpated  Neuro: Grossly nonfocal  Data Reviewed: Basic Metabolic Panel:  Recent Labs Lab 10/13/12 0150 10/14/12 0347 10/16/12 0431  NA 135 135 134*  K 5.1 5.0 3.8  CL 106 106 105  CO2 23 22 21   GLUCOSE 247* 138* 119*  BUN 24* 21 21  CREATININE 1.00 1.09 1.15*  CALCIUM 11.4* 10.3 10.4   CBC:  Recent Labs Lab 10/13/12 0150 10/14/12 0347 10/16/12 0431  WBC 6.8 7.0 9.3  NEUTROABS 4.5  --   --   HGB 9.7* 9.0* 9.6*  HCT 31.7* 30.4* 31.6*  MCV 81.7 83.1 81.9  PLT 267 260 246   Cardiac Enzymes:  Recent Labs Lab 10/13/12 0150  TROPONINI <0.30   BNP: No components found with this basename: POCBNP,  CBG:  Recent Labs Lab 10/16/12 0928 10/16/12 1113 10/16/12 1624 10/16/12 2154 10/17/12 0734  GLUCAP 114* 116* 138* 116* 150*    MRSA PCR SCREENING     Status: None   Collection Time    10/14/12  7:00 PM      Result Value Range Status   MRSA by PCR NEGATIVE  NEGATIVE Final     Studies: No results found.  Scheduled Meds: . aspirin EC  81 mg Oral Daily  . atorvastatin  10 mg Oral q1800  . carvedilol  6.25 mg Oral BID WC  . famotidine  20 mg Oral Daily  . furosemide  40 mg Oral BID  . insulin aspart  0-15 Units Subcutaneous TID WC  . insulin aspart  0-5 Units Subcutaneous QHS  . isosorbide mononitrate  30 mg Oral Daily  . levofloxacin (LEVAQUIN)   750 mg Intravenous Q24H  . spironolactone  12.5 mg Oral Daily  . vancomycin  1,000 mg Intravenous Q12H  . warfarin  5 mg Oral ONCE-1800   Continuous Infusions: . sodium chloride 10 mL/hr at 10/15/12 1030

## 2012-10-17 NOTE — Progress Notes (Signed)
Patient ID: [name] [name], female   DOB: 1946/02/12, 67 y.o.   MRN: [id number] Patient awake and alert.  Not much pain at all.  AF/VSS.  Right foot dressing clean and dry.  Will leave dressing on for a few days.  Can be up with PT and bear weight thru right heel.

## 2012-10-17 NOTE — Progress Notes (Signed)
Physical Therapy Treatment Patient Details Name: [name] [name] MRN: [id number] DOB: 1946-01-30 Today's Date: 10/17/2012 Time: 6440-3474 PT Time Calculation (min): 35 min  PT Assessment / Plan / Recommendation Comments on Treatment Session  Edema in both legs perists, Pt is now s/p transmet amp and is using darco shoe to unweight Right forefoot without difficulty.  She has some difficulty pivoting on left foot to complete transfer. Pt becomes upset at the thought of SNF for rehab due to financial reasons and wants to go home with husband at transfer level.  Will issue exercise program in am     Follow Up Recommendations  SNF;Supervision/Assistance - 24 hour;Home health PT     Does the patient have the potential to tolerate intense rehabilitation     Barriers to Discharge        Equipment Recommendations  Other (comment) (BSC)    Recommendations for Other Services    Frequency Min 3X/week   Plan Discharge plan needs to be updated;Frequency remains appropriate    Precautions / Restrictions Precautions Precaution Comments: darco shoe on right foot for weight bearing on heel only Restrictions Weight Bearing Restrictions: No   Pertinent Vitals/Pain Pt has increasing weight on RLE with increasing activity    Mobility  Bed Mobility Bed Mobility: Supine to Sit;Sit to Supine;Sitting - Scoot to Edge of Bed Supine to Sit: 3: Mod assist Sitting - Scoot to Edge of Bed: 3: Mod assist Sit to Supine: 3: Mod assist Details for Bed Mobility Assistance: needs assist with pad to scoot to edge of bed to get to sitting. assis to bring legs up onto bed for sit to supine. Pt was able to bridge with both legs to get pad back under her Transfers Transfers: Sit to Stand;Stand to Sit;Stand Pivot Transfers Sit to Stand: 3: Mod assist Stand to Sit: 3: Mod assist Stand Pivot Transfers: 3: Mod assist Details for Transfer Assistance: needs assist to lift body up and has difficulty transitioning UE  from armchair to walker,  O2 maintained but pt had decreased saturation with activity Ambulation/Gait Ambulation/Gait Assistance: Not tested (comment) Assistive device: Rolling walker Stairs: No Wheelchair Mobility Wheelchair Mobility: No    Exercises Other Exercises Other Exercises: bridging in supine Other Exercises: standing balance with RW   PT Diagnosis:    PT Problem List:   PT Treatment Interventions:     PT Goals Acute Rehab PT Goals PT Goal Formulation: With patient Time For Goal Achievement: 10/29/12 Potential to Achieve Goals: Fair Pt will go Sit to Supine/Side: with supervision PT Goal: Sit to Supine/Side - Progress: Progressing toward goal Pt will go Sit to Stand: with supervision PT Goal: Sit to Stand - Progress: Progressing toward goal Pt will go Stand to Sit: with supervision PT Goal: Stand to Sit - Progress: Progressing toward goal Pt will Transfer Bed to Chair/Chair to Bed: with supervision Pt will Ambulate: 1 - 15 feet;with min assist;with rolling walker Pt will Perform Home Exercise Program: with supervision, verbal cues required/provided PT Goal: Perform Home Exercise Program - Progress: Progressing toward goal  Visit Information  Last PT Received On: 10/17/12 Assistance Needed: +1    Subjective Data  Subjective: "I can't afford it" to go to SNF for rehab.  Pt became tearful "I'll lose everything" Patient Stated Goal: to go home with husbanc   Cognition  Cognition Overall Cognitive Status: Appears within functional limits for tasks assessed/performed Arousal/Alertness: Awake/alert Orientation Level: Appears intact for tasks assessed Behavior During Session: [facility] for tasks performed  Balance  Balance Balance Assessed: Yes Static Sitting Balance Static Sitting - Balance Support: No upper extremity supported Static Sitting - Level of Assistance: 7: Independent Static Standing Balance Static Standing - Balance Support: Bilateral upper extremity  supported;During functional activity Static Standing - Level of Assistance: 4: Min assist  End of Session PT - End of Session Equipment Utilized During Treatment: Oxygen Activity Tolerance: Patient limited by fatigue Patient left: in bed;with call bell/phone within reach;Other (comment) (returned to bed for echocardiogram) Nurse Communication: Mobility status   GP   Bayard Hugger. [name], [place] [phone] 10/17/2012, 2:26 PM

## 2012-10-17 NOTE — Progress Notes (Signed)
Pharmacist Heart Failure Core Measure Documentation  Assessment: [name] [name] has an EF documented as 30% on 07/20/2012 by 2D Echo  Rationale: Heart failure patients with left ventricular systolic dysfunction (LVSD) and an EF < 40% should be prescribed an angiotensin converting enzyme inhibitor (ACEI) or angiotensin receptor blocker (ARB) at discharge unless a contraindication is documented in the medical record.  This patient is not currently on an ACEI or ARB for HF.  This note is being placed in the record in order to provide documentation that a contraindication to the use of these agents is present for this encounter.  ACE Inhibitor or Angiotensin Receptor Blocker is contraindicated (specify all that apply)  []   ACEI allergy AND ARB allergy []   Angioedema []   Moderate or severe aortic stenosis []   Hyperkalemia [x]   Hypotension []   Renal artery stenosis []   Worsening renal function, preexisting renal disease or dysfunction   Gwen Her 10/17/2012 8:13 AM

## 2012-10-18 DIAGNOSIS — Z9119 Patient's noncompliance with other medical treatment and regimen: Secondary | ICD-10-CM

## 2012-10-18 LAB — CBC
Platelets: 223 10*3/uL (ref 150–400)
RBC: 3.65 MIL/uL — ABNORMAL LOW (ref 3.87–5.11)
RDW: 18.7 % — ABNORMAL HIGH (ref 11.5–15.5)
WBC: 7.6 10*3/uL (ref 4.0–10.5)

## 2012-10-18 LAB — BASIC METABOLIC PANEL
CO2: 26 mEq/L (ref 19–32)
Calcium: 10.2 mg/dL (ref 8.4–10.5)
Chloride: 103 mEq/L (ref 96–112)
GFR calc Af Amer: 53 mL/min — ABNORMAL LOW (ref >90)
Sodium: 135 mEq/L (ref 135–145)

## 2012-10-18 LAB — GLUCOSE, CAPILLARY
Glucose-Capillary: 143 mg/dL — ABNORMAL HIGH (ref 70–99)
Glucose-Capillary: 137 mg/dL — ABNORMAL HIGH (ref 70–99)
Glucose-Capillary: 129 mg/dL — ABNORMAL HIGH (ref 70–99)

## 2012-10-18 LAB — PROTIME-INR
INR: 1.35 (ref 0.00–1.49)
Prothrombin Time: 16.4 seconds — ABNORMAL HIGH (ref 11.6–15.2)

## 2012-10-18 MED ORDER — WARFARIN SODIUM 7.5 MG PO TABS
7.5000 mg | ORAL_TABLET | Freq: Once | ORAL | Status: AC
  Administered 2012-10-18: 7.5 mg via ORAL
  Filled 2012-10-18: qty 1

## 2012-10-18 NOTE — Progress Notes (Signed)
Inpatient Diabetes Program Recommendations  AACE/ADA: New Consensus Statement on Inpatient Glycemic Control (2013)  Target Ranges:  Prepandial:   less than 140 mg/dL      Peak postprandial:   less than 180 mg/dL (1-2 hours)      Critically ill patients:  140 - 180 mg/dL   Reason for Visit: Consult  Pt ready to go home.  Unwilling to consider SNF at this point.  States she checks blood sugars 2 - 3 times/day and follows "diabetic diet."  She stated she will not take insulin at home.    Results for [name], [name] (MRN [id number]) as of 10/18/2012 13:21  Ref. Range 10/17/2012 11:08  Hemoglobin A1C Latest Range: <5.7 % 8.8 (H)     Results for [name], [name] (MRN [id number]) as of 10/18/2012 13:21  Ref. Range 10/17/2012 11:42 10/17/2012 17:10 10/17/2012 21:27 10/18/2012 07:25 10/18/2012 12:13  Glucose-Capillary Latest Range: 70-99 mg/dL 782 (H) 956 (H) 213 (H) 143 (H) 137 (H)   Good glycemic control in hospital.  HgbA1C slightly improved from November 2013 (9.0% on 06/27/2012)  Recommendations:  Consider addition of Amaryl since pt refuses insulin at home. F/U with PCP for management of diabetes.  Thank you. Ailene Ards, RD, LDN, CDE Inpatient Diabetes Coordinator [phone]

## 2012-10-18 NOTE — Progress Notes (Signed)
Patient ID: [name] [name], female   DOB: 1946/06/05, 67 y.o.   MRN: [id number] I changed her right foot dressing at the bedside.  The transmetatarsal amputation site and plantar based flap looks good. The tissue is viable and pick and the previous dressing was bloody, which is a good sign given her poor blood flow.  This does point to her ability to heal.  I then placed a new dressing. From my standpoint, she can continue to put weight thru her right heel and the dressing can be changed and re-enforced every day to every other day depending on any drainage.

## 2012-10-18 NOTE — Progress Notes (Signed)
[name] [name] has been stable post-operatively.  I agree with [name] [name] note.  Will back down on Lasix.  Will need to set up OP f/u -- preferentially @ [facility] [place] office with [name] [name], but needs @ least 1 visit with our pharmacist for warfarin monitoring.  Had voiced interest in changing venue for care aware from [facility] facilities, due a distant "bad experience" related her her 1st husband's death.   [name] [name], M.D., M.S. [facility] [address] [place], [place]  [address]  [phone] Pager # [phone] 10/18/2012 6:29 PM

## 2012-10-18 NOTE — Progress Notes (Signed)
[facility] PROGRESS NOTE  [name] [name] JYN:[id number] DOB: 02/21/1946 DOA: 10/13/2012 PCP: No primary provider on file.  Brief narrative: 67 y.o. 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 In ED, patient was found to have gangrenous right foot and possible osteomyelitis. In addition, evaluation in ED included x-ray of the right foot which did not reveal fractures by osteomyelitis could not be excluded. Furthermore, x-ray of left hip and thoracic spine showed no displaced fracture identified. CBC done in ED revealed hemoglobin of 9.7. Patient initially refused orthopedics evaluation and after a few days finally agreed to a consultation. Patient is now status post a right transmetatarsal amputation done 10/16/2012 with no subsequent complications.   Assessment/Plan:  Principal Problem:  Right foot gangrene and osteomyelitis  Status post right transmetatarsal amputation in 10/16/2012 with no subsequent complications  Patient already received 5 days of vancomycin. No fever and no elevated white blood cell count. Vancomycin discontinued 10/17/2012. Appreciate orthopedic surgery following  Per PT evaluation, SNF recommended. SW assisting d/c plan. Active Problems:  Possible community-acquired pneumonia  Patient took Levaquin for 5 days. Respiratory status is stable. We discontinued Levaquin 10/17/2012. DM, Type 2 IDDM, uncontrolled with complications  A1c 8.8 on this admission. Appreciate diabetic coordinator education.  Continue sliding scale insulin  CBGs in last 24 hours: 106, 149, 143 Chronic combined systolic and diastolic congestive heart failure  BNP on this admission is 19,285 and last 2D echo is in November 2013 with ejection fraction of 35%. 2 D ECHO on this admission with even lower EF of 25%. Cardiology was consulted as part of  preoperative evaluation. Recommendation is to increase lasix to 80 mg BID now due to worsening heart failure. Continue Imdur and Coreg  Strict intake and output, daily weight: 2/21 - 89.27 kg; 2/22 88.23 kg. Needs daily weight but it was not recorded. Will measure today. Severe coronary artery disease, 3 vessel disease   Continue aspirin, Lipitor Hypertension  Blood pressure 108/58 Continue Imdur 30 mg daily, Corag 6.25 mg twice daily Chronic kidney disease, stage III   Creatinine 1.21 today. Patient had creatinine 1.51 in 07/2012  Continue to monitor renal function Anemia of chronic disease   Likely secondary to CKD and CHF   Hemoglobin 9.2 Continue to monitor CBC. History of popliteal DVT  We will continue Coumadin per pharmacy   Code Status: Full code  Family Communication: Family not at bedside  Disposition Plan: Home when stable; per PT SNF recomended  [name], MD  [facility]  Pager [phone]   Consultants:  Orthopedic surgery (Dr. Doneen Poisson)  10/15/2012  Cardiology (Dr. Bryan Lemma) Procedures:  Right transmetatarsal amputation 10/16/2012 Antibiotics:  Vancomycin 10/13/2012 --> 10/17/2012  Levaquin 10/13/2012 -->10/17/2012   If 7PM-7AM, please contact night-coverage [email or web address] Password [facility] 10/18/2012, 7:16 AM   LOS: 5 days    HPI/Subjective: No acute overnight evens.  Objective: Filed Vitals:   10/17/12 0539 10/17/12 1330 10/17/12 2026 10/18/12 0446  BP: 102/60 106/66 105/59 108/58  Pulse: 69 74 83 76  Temp: 98 F (36.7 C) 98.6 F (37 C) 97.8 F (36.6 C) 98.2 F (36.8 C)  TempSrc: Oral Oral Oral Oral  Resp: 18 16 16 18   Height:      Weight:      SpO2: 96% 95% 93% 96%    Intake/Output Summary (Last 24 hours) at 10/18/12 9629 Last data filed  at 10/18/12 0702  Gross per 24 hour  Intake    840 ml  Output   3550 ml  Net  -2710 ml    Exam:   General:  Pt is alert, follows commands appropriately, not in acute  distress  Cardiovascular: Regular rate and rhythm, S1/S2 appreciated  Respiratory: Clear to auscultation bilaterally, no wheezing, no crackles, no rhonchi  Abdomen: Soft, non tender, non distended, bowel sounds present, no guarding  Extremities: LE edema, dressing right foot  Neuro: Grossly nonfocal  Data Reviewed: Basic Metabolic Panel:  Recent Labs Lab 10/13/12 0150 10/14/12 0347 10/16/12 0431 10/18/12 0405  NA 135 135 134* 135  K 5.1 5.0 3.8 3.6  CL 106 106 105 103  CO2 23 22 21 26   GLUCOSE 247* 138* 119* 144*  BUN 24* 21 21 23   CREATININE 1.00 1.09 1.15* 1.21*  CALCIUM 11.4* 10.3 10.4 10.2   Liver Function Tests: No results found for this basename: AST, ALT, ALKPHOS, BILITOT, PROT, ALBUMIN,  in the last 168 hours No results found for this basename: LIPASE, AMYLASE,  in the last 168 hours No results found for this basename: AMMONIA,  in the last 168 hours CBC:  Recent Labs Lab 10/13/12 0150 10/14/12 0347 10/16/12 0431 10/18/12 0405  WBC 6.8 7.0 9.3 7.6  NEUTROABS 4.5  --   --   --   HGB 9.7* 9.0* 9.6* 9.2*  HCT 31.7* 30.4* 31.6* 29.9*  MCV 81.7 83.1 81.9 81.9  PLT 267 260 246 223   Cardiac Enzymes:  Recent Labs Lab 10/13/12 0150  TROPONINI <0.30   BNP: No components found with this basename: POCBNP,  CBG:  Recent Labs Lab 10/16/12 2154 10/17/12 0734 10/17/12 1142 10/17/12 1710 10/17/12 2127  GLUCAP 116* 150* 125* 106* 149*    MRSA PCR SCREENING     Status: None   Collection Time    10/14/12  7:00 PM      Result Value Range Status   MRSA by PCR NEGATIVE  NEGATIVE Final     Studies: No results found.  Scheduled Meds: . antiseptic oral rinse  15 mL Mouth Rinse BID  . aspirin EC  81 mg Oral Daily  . atorvastatin  10 mg Oral q1800  . carvedilol  6.25 mg Oral BID WC  . famotidine  20 mg Oral Daily  . furosemide  80 mg Oral BID  . insulin aspart  0-15 Units Subcutaneous TID WC  . insulin aspart  0-5 Units Subcutaneous QHS  .  isosorbide mononitrate  30 mg Oral Daily  . spironolactone  12.5 mg Oral Daily  . Warfarin -    Does not apply q1800   Continuous Infusions: . sodium chloride 20 mL/hr (10/17/12 1256)

## 2012-10-18 NOTE — Progress Notes (Signed)
Subjective:  No chest pain or SOB.  Objective:  Vital Signs in the last 24 hours: Temp:  [97.8 F (36.6 C)-98.2 F (36.8 C)] 98 F (36.7 C) (02/25 1358) Pulse Rate:  [76-83] 76 (02/25 1358) Resp:  [16-18] 18 (02/25 1358) BP: (103-108)/(55-59) 103/55 mmHg (02/25 1358) SpO2:  [93 %-96 %] 93 % (02/25 1358)  Intake/Output from previous day:  Intake/Output Summary (Last 24 hours) at 10/18/12 1650 Last data filed at 10/18/12 1357  Gross per 24 hour  Intake    720 ml  Output   3900 ml  Net  -3180 ml    Physical Exam: General appearance: alert, cooperative, no distress and pale Lungs: clear to auscultation bilaterally Heart: regular rate and rhythm   Rate: 76  Rhythm: indeterminate and not on tele  Lab Results:  Recent Labs  10/16/12 0431 10/18/12 0405  WBC 9.3 7.6  HGB 9.6* 9.2*  PLT 246 223    Recent Labs  10/16/12 0431 10/18/12 0405  NA 134* 135  K 3.8 3.6  CL 105 103  CO2 21 26  GLUCOSE 119* 144*  BUN 21 23  CREATININE 1.15* 1.21*   No results found for this basename: TROPONINI, CK, MB,  in the last 72 hours Hepatic Function Panel No results found for this basename: PROT, ALBUMIN, AST, ALT, ALKPHOS, BILITOT, BILIDIR, IBILI,  in the last 72 hours No results found for this basename: CHOL,  in the last 72 hours  Recent Labs  10/18/12 0405  INR 1.35    Imaging: Imaging results have been reviewed  Cardiac Studies:  Assessment/Plan:   Principal Problem:   Gangrene of foot, s/p Rt foot transmetatarsal amputation 10/16/12  Active Problems:   PVD (peripheral vascular disease)   CAD, 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 ischemic Rt foot   Chronic combined systolic and diastolic CHF (congestive heart failure)   Chronic kidney disease, stage 3   LBBB (left bundle branch block)   DM, Type 2 IDDM   Anemia of  chronic disease   Non compliance with medical Rx secondary to finances  Plan- Watch for increasing renal insufficiency (on Lasix 80mg  BID),  and hyperkalemia (on Aldactone 12.5mg ). She had both last time she was in the hospital. Consider decreasing Lasix at discharge. We will arrange for OP follow up- hopefully she will be compliant this time.    Corine Shelter PA-C 10/18/2012, 4:50 PM

## 2012-10-18 NOTE — Progress Notes (Signed)
Physical Therapy Treatment Patient Details Name: [name] [name] MRN: [id number] DOB: 09-13-1945 Today's Date: 10/18/2012 Time: 1210-1258 PT Time Calculation (min): 48 min  PT Assessment / Plan / Recommendation Comments on Treatment Session  Pt required much encouragement and assist today. Recommend max home health services at home. she does not want a [name] bed as she says there is no room and she sleeps in a recliner.  Recommend OT Consult here and [facility]Ha[facility] and HHPT at home    Follow Up Recommendations  Home health PT;Other (comment) [name] [name])     Does the patient have the potential to tolerate intense rehabilitation     Barriers to Discharge        Equipment Recommendations   [name] [name])    Recommendations for Other Services OT consult  Frequency Min 3X/week   Plan Discharge plan needs to be updated;Frequency remains appropriate    Precautions / Restrictions Precautions Precaution Comments: darco shoe on right foot for weight bearing on heel only Restrictions Weight Bearing Restrictions: No   Pertinent Vitals/Pain Pt c/o increasing pain in right foot as she is sitting up and moving around    Mobility  Bed Mobility Bed Mobility: Supine to Sit;Sit to Supine;Sitting - Scoot to Edge of Bed Supine to Sit: 3: Mod assist Sitting - Scoot to Edge of Bed: 3: Mod assist Sit to Supine: 3: Mod assist Details for Bed Mobility Assistance: at first, Pt was unable to understand how to bridge to help get herself to edge of bed.  Finally, at end of session she was able to lift hips to help with scooting. Transfers Transfers: Sit to Stand;Stand to [employer] Transfers Sit to Stand: 3: Mod assist Stand to Sit: 3: Mod assist Stand Pivot Transfers: 3: Mod assist Details for Transfer Assistance: attempted to help pt stand pivot to [name] [name].  She became very upset, tearful and ultimately required 2 people to pivot her to [name] [name] to urinate.  Afer that, she required excessive time and encouragement to  get back to bed and up to head of bed to be assisted back to supine position Ambulation/Gait Ambulation/Gait Assistance: Not tested (comment);3: Mod assist Ambulation Distance (Feet): 2 Feet Assistive device: Rolling walker Gait velocity: decreased.  Pt easilty distracted, wants to talk about other things instead of concentrating on excercise and transfer General Gait Details: pt with generalzed weakness and decreased muscle mass impairing her ability to progress with gait. Stairs: No Wheelchair Mobility Wheelchair Mobility: No    Exercises General Exercises - Lower Extremity Ankle Circles/Pumps: AROM;Both;10 reps;Supine Quad Sets: AROM;Both;10 reps;Supine Gluteal Sets: AROM;Both;10 reps;Standing Short Arc Quad: AROM;Both;10 reps;Supine Hip Flexion/Marching: AROM;Both;10 reps;Supine Other Exercises Other Exercises: bridging in supine Other Exercises: standing balance with RW Other Exercises: attempted repeated sit to stand, but pt had difficulty Other Exercises: issued written home program for core and LE strength and left a hard copy in the shadow chart   PT Diagnosis:    PT Problem List:   PT Treatment Interventions:     PT Goals Acute Rehab PT Goals PT Goal Formulation: With patient Time For Goal Achievement: 10/29/12 Potential to Achieve Goals: Fair Pt will go Sit to Supine/Side: with supervision PT Goal: Sit to Supine/Side - Progress: Progressing toward goal Pt will go Sit to Stand: with supervision PT Goal: Sit to Stand - Progress: Progressing toward goal Pt will go Stand to Sit: with supervision PT Goal: Stand to Sit - Progress: Progressing toward goal Pt will Transfer Bed to Chair/Chair to Bed: with supervision  PT Transfer Goal: Bed to Chair/Chair to Bed - Progress: Progressing toward goal Pt will Ambulate: 1 - 15 feet;with min assist;with rolling walker PT Goal: Ambulate - Progress: Progressing toward goal Pt will Perform Home Exercise Program: with supervision,  verbal cues required/provided PT Goal: Perform Home Exercise Program - Progress: Progressing toward goal  Visit Information  Last PT Received On: 10/18/12 Assistance Needed: +2    Subjective Data  Subjective: "I don't understand how I could do it last week, but I cannot do it today"  re: standing and moving  Patient Stated Goal: to go home   Cognition  Cognition Overall Cognitive Status: Appears within functional limits for tasks assessed/performed Arousal/Alertness: Lethargic Orientation Level: Appears intact for tasks assessed Behavior During Session: [name] [name] for tasks performed Cognition - Other Comments: pt falls asleep easily, needs much encouragement.  Insists on going home. but at the same time says she is unable to stand or completel exercise program    Balance  Balance Balance Assessed: Yes Static Sitting Balance Static Sitting - Balance Support: No upper extremity supported Static Sitting - Level of Assistance: 7: Independent Static Sitting - Comment/# of Minutes: sitting on EOB, pt c/o dizziness Static Standing Balance Static Standing - Balance Support: Bilateral upper extremity supported;During functional activity Static Standing - Level of Assistance: 4: Min assist Static Standing - Comment/# of Minutes: 10 seconds only.  Needed much encouragement, not able to elicit much glute activity  End of Session PT - End of Session Equipment Utilized During Treatment: Oxygen Activity Tolerance: Patient limited by fatigue Patient left: in bed Nurse Communication: Mobility status   GP    [name] [name], [place] [phone] 10/18/2012, 1:44 PM

## 2012-10-18 NOTE — Progress Notes (Signed)
Clinical Social Work Department BRIEF PSYCHOSOCIAL ASSESSMENT 10/18/2012  Patient:  [name] [name], [name] [name]     Account Number:  [id number]     Admit date:  10/13/2012  Clinical Social Worker:  Jacelyn Grip  Date/Time:  10/18/2012 10:45 AM  Referred by:  Physician  Date Referred:  10/18/2012 Referred for  SNF Placement   Other Referral:   Interview type:  Patient Other interview type:    PSYCHOSOCIAL DATA Living Status:  SIGNIFICANT OTHER Admitted from facility:   Level of care:   Primary support name:  Ricky Twotrees/significant other Primary support relationship to patient:  SPOUSE Degree of support available:   unknown at this time-no family at bedside    CURRENT CONCERNS Current Concerns  Post-Acute Placement   Other Concerns:    SOCIAL WORK ASSESSMENT / PLAN CSW received referral for New SNF placement.    CSW reviewed chart and noted PT evaluation recommending SNF, but pt reported to PT she plans to return home with significant other and HH services.    CSW and RNCM met with pt at bedside. Pt stated that she plans to return home and does not want to discuss any other options for discharge plan. CSW notified pt that if any questions arise in regard to SNF then CSW would be available to answer those questions.    RNCM assisting with pt HH needs.    No further social work needs identified at this time as pt plans to return home with significant other and HH services.    CSW signing off. Please re-consult if further social work needs arise.   Assessment/plan status:  No Further Intervention Required Other assessment/ plan:   Information/referral to community resources:   RNCM following for [facility] needs.    PATIENT'S/FAMILY'S RESPONSE TO PLAN OF CARE: Pt alert and oriented x 4. Pt has decision making capacity. Pt adament about returning home with signicant other and HH services. CSW signed off. Please re-consult if further social work needs arise.     Jacklynn Lewis,  MSW, LCSWA  Clinical Social Work [phone]

## 2012-10-18 NOTE — Progress Notes (Signed)
ANTICOAGULATION CONSULT NOTE - FOLLOW UP  Pharmacy Consult for Coumadin Indication: Hx DVT, Coumadin PTA    Allergies  Allergen Reactions  . Penicillins Anaphylaxis  . Sulfa Antibiotics Anaphylaxis   Labs:  Recent Labs  10/16/12 0431 10/17/12 0408 10/18/12 0405  HGB 9.6*  --  9.2*  HCT 31.6*  --  29.9*  PLT 246  --  223  LABPROT 16.3* 16.2* 16.4*  INR 1.34 1.33 1.35  CREATININE 1.15*  --  1.21*    Estimated Creatinine Clearance: 51.2 ml/min (by C-G formula based on Cr of 1.21).  Medical History: Past Medical History  Diagnosis Date  . 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 Diabetes mellitus type 2, uncontrolled, with complications     Very poorly controlled  . Coronary artery disease     Severe Multivessel Disase; 3 V Disease wtih LAD distribution Viability &  inferior scar.  . DVT, bilateral lower limbs 05/2012  . 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 On Coumadin 5 mg daily PTA for hx DVT. Coumadin held on admission in anticipation of surgery and heparin sq for vte ppx started. Pt initially refused amputation so Coumadin was resumed 2/21. Agreed to amputation 2/22 so Coumadin held. Surgery was 2/23 and Coumadin resumed post op. INR tx on admission but became subtx 2/20 and is not currently responding INR remains subtx after 5mg , 0, 7.5mg  and 5mg  No bleeding reported and CBC stable Levaquin for possible pna d/c'd 2/24 - may increase INR/decrease Coumadin requirements  Goal of Therapy:  INR 2-3  Plan:  Increase Coumadin to 7.5mg  tonight Daily PT/INR Defer decision to bridge w/ parenteral agent to MD   Hessie Knows, PharmD, BCPS Pager [phone] 10/18/2012 10:50 AM

## 2012-10-19 ENCOUNTER — Inpatient Hospital Stay (HOSPITAL_COMMUNITY): Payer: Medicare Other

## 2012-10-19 DIAGNOSIS — R0902 Hypoxemia: Secondary | ICD-10-CM | POA: Diagnosis not present

## 2012-10-19 DIAGNOSIS — E876 Hypokalemia: Secondary | ICD-10-CM | POA: Diagnosis not present

## 2012-10-19 LAB — BASIC METABOLIC PANEL
BUN: 22 mg/dL (ref 6–23)
CO2: 29 mEq/L (ref 19–32)
Calcium: 10.5 mg/dL (ref 8.4–10.5)
Chloride: 102 mEq/L (ref 96–112)
Creatinine, Ser: 1.16 mg/dL — ABNORMAL HIGH (ref 0.50–1.10)
GFR calc Af Amer: 56 mL/min — ABNORMAL LOW (ref >90)
GFR calc non Af Amer: 48 mL/min — ABNORMAL LOW (ref >90)
Glucose, Bld: 148 mg/dL — ABNORMAL HIGH (ref 70–99)
Potassium: 3.4 mEq/L — ABNORMAL LOW (ref 3.5–5.1)
Sodium: 138 mEq/L (ref 135–145)

## 2012-10-19 LAB — CBC
HCT: 31.9 % — ABNORMAL LOW (ref 36.0–46.0)
Hemoglobin: 9.8 g/dL — ABNORMAL LOW (ref 12.0–15.0)
MCH: 24.7 pg — ABNORMAL LOW (ref 26.0–34.0)
MCHC: 30.7 g/dL (ref 30.0–36.0)
RBC: 3.97 MIL/uL (ref 3.87–5.11)

## 2012-10-19 LAB — D-DIMER, QUANTITATIVE: D-Dimer, Quant: 1.51 ug/mL-FEU — ABNORMAL HIGH (ref 0.00–0.48)

## 2012-10-19 LAB — PROTIME-INR
INR: 1.5 — ABNORMAL HIGH (ref 0.00–1.49)
Prothrombin Time: 17.7 seconds — ABNORMAL HIGH (ref 11.6–15.2)

## 2012-10-19 LAB — GLUCOSE, CAPILLARY
Glucose-Capillary: 194 mg/dL — ABNORMAL HIGH (ref 70–99)
Glucose-Capillary: 139 mg/dL — ABNORMAL HIGH (ref 70–99)

## 2012-10-19 MED ORDER — WARFARIN SODIUM 7.5 MG PO TABS
7.5000 mg | ORAL_TABLET | Freq: Once | ORAL | Status: AC
  Administered 2012-10-19: 7.5 mg via ORAL
  Filled 2012-10-19: qty 1

## 2012-10-19 MED ORDER — LEVOFLOXACIN 500 MG PO TABS
500.0000 mg | ORAL_TABLET | Freq: Every day | ORAL | Status: DC
  Administered 2012-10-19 – 2012-10-20 (×2): 500 mg via ORAL
  Filled 2012-10-19 (×2): qty 1

## 2012-10-19 MED ORDER — ENOXAPARIN SODIUM 80 MG/0.8ML ~~LOC~~ SOLN
1.0000 mg/kg | Freq: Two times a day (BID) | SUBCUTANEOUS | Status: DC
  Administered 2012-10-19 – 2012-10-20 (×3): 80 mg via SUBCUTANEOUS
  Filled 2012-10-19 (×4): qty 0.8

## 2012-10-19 MED ORDER — DOXYCYCLINE HYCLATE 100 MG PO TABS
100.0000 mg | ORAL_TABLET | Freq: Two times a day (BID) | ORAL | Status: DC
  Administered 2012-10-19 – 2012-10-20 (×3): 100 mg via ORAL
  Filled 2012-10-19 (×4): qty 1

## 2012-10-19 MED ORDER — POTASSIUM CHLORIDE CRYS ER 20 MEQ PO TBCR
40.0000 meq | EXTENDED_RELEASE_TABLET | Freq: Once | ORAL | Status: AC
  Administered 2012-10-19: 40 meq via ORAL
  Filled 2012-10-19 (×2): qty 2

## 2012-10-19 NOTE — Care Management (Signed)
Per patient [facility] to provide [facility] services upon discharge. [facility] rep Asher Muir in speaking with pt to verify address. MD order for home oxygen therapy and BSC. [facility] rep [place] made aware. Pt to discharge home with significant other. No other HH needs or DME required.   Roxy Manns Kahne Helfand,RN,BSN [phone]

## 2012-10-19 NOTE — Progress Notes (Addendum)
ANTICOAGULATION CONSULT NOTE - FOLLOW UP  Pharmacy Consult for Coumadin/Lovenox Indication: Hx DVT, Coumadin PTA    Allergies  Allergen Reactions  . Penicillins Anaphylaxis  . Sulfa Antibiotics Anaphylaxis   Labs:  Recent Labs  10/17/12 0408 10/18/12 0405 10/19/12 0356  HGB  --  9.2*  --   HCT  --  29.9*  --   PLT  --  223  --   LABPROT 16.2* 16.4* 17.7*  INR 1.33 1.35 1.50*  CREATININE  --  1.21* 1.16*    Estimated Creatinine Clearance: 51.1 ml/min (by C-G formula based on Cr of 1.16).  Medical History: Past Medical History  Diagnosis Date  . 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 Diabetes mellitus type 2, uncontrolled, with complications     Very poorly controlled  . Coronary artery disease     Severe Multivessel Disase; 3 V Disease wtih LAD distribution Viability &  inferior scar.  . DVT, bilateral lower limbs 05/2012  . 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 On Coumadin 5 mg daily PTA for hx DVT. Coumadin held on admission in anticipation of surgery and heparin sq for vte ppx started. Pt initially refused amputation so Coumadin was resumed 2/21. Agreed to amputation 2/22 so Coumadin held. Surgery was 2/23 and Coumadin resumed post op. INR tx on admission but became subtx 2/20 and is not currently responding INR remains subtx but finally rising some after 5mg , 0, 7.5mg , 5mg , 7.5mg  No bleeding reported and CBC stable Levaquin for possible pna d/c'd 2/24 - may increase INR/decrease Coumadin requirements To start full dose Lovenox until INR therapeutic per Md order today 2/26  Goal of Therapy:  INR 2-3  Plan:  Repeat Coumadin 7.5mg  tonight Start Lovenox 80mg  SQ q12 until INR > 2 Daily PT/INR   Hessie Knows, PharmD, BCPS Pager [phone] 10/19/2012 11:18 AM

## 2012-10-19 NOTE — Progress Notes (Signed)
[facility] PROGRESS NOTE  [name] [name] BJY:[id number] DOB: April 16, 1946 DOA: 10/13/2012 PCP: No primary provider on file.  Assessment/Plan:  #1 hypoxemia Questionable etiology. Patient does have a history of DVT status post transmetatarsal amputation with a subtherapeutic INR. Patient also with a history of CHF. Patient refusing chest x-ray. Patient refusing any further workup in terms of her hypoxemia. Will place on full dose Lovenox until INR is therapeutic. Continue diuretics with Lasix. Will need home O2.  #2 right forefoot ischemia with necrosis and dry gangrene Status post right foot transmetatarsal amputation 10/16/2012. Patient received 5 days of IV vancomycin. Will place patient on doxycycline 100 mg twice daily x2 weeks to cover for possible community-acquired MRSA, also Levaquin for 2 weeks orally pseudomonas coverage per curbside recommendations of ID. Orthopedics is following.  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 I/O. equal -369  Over 24 hours. Continue current dose of Lasix and Aldactone. Continue Imdur and Coreg. Cardiology following.  #4 questionable possible community-acquired pneumonia Patient is status post 5 days of Levaquin.  #5 uncontrolled type 2 diabetes Hemoglobin A1c is 8.8. CBGs have ranged from 129-194. Continue sliding scale insulin.  #6 coronary artery disease Stable. Patient is currently asymptomatic. Continue aspirin, Lipitor, Coreg, Imdur, Lasix,  #7 chronic kidney disease stage III Stable. Will follow.  #8 anemia of chronic disease H&H is stable follow.  #9 history of popliteal DVT Coumadin was held prior to surgery. INR is currently subtherapeutic. We'll place on full dose Lovenox for now until INR is therapeutic. Continue Coumadin.  #10 hypokalemia Replete.  #11 prophylaxis Will place on full dose Lovenox for DVT prophylaxis until INR is therapeutic.  Code Status: Full. Family Communication: Updated patient no  family at bedside. Disposition Plan: Home when medically stable.   Consultants:  Orthopedics: Dr. Magnus Ivan 10/15/2012  Cardiology: Dr. Herbie Baltimore 10/15/2012  Procedures:  Status post right foot transmetatarsal amputation 10/16/2012 Dr. Juanda Chance of the thoracic spine 10/13/2012  X-ray of the right foot 10/13/2012  Chest x-ray 10/13/2012  X-ray of the left hip 10/13/2012  Antibiotics:  IV vancomycin x5 days  IV Levaquin x5 days  HPI/Subjective: Patient tearful states she didn't sleep all night secondary to diuretics. Patient states she's always had respiratory problems and refused chest x-ray for evaluation of her hypoxemia. Patient states he does not want any further workup done. Patient denies any chest pain or shortness of breath.  Objective: Filed Vitals:   10/18/12 1358 10/18/12 2042 10/19/12 0426 10/19/12 0641  BP: 103/55 107/58 97/83   Pulse: 76 78 82   Temp: 98 F (36.7 C) 98.1 F (36.7 C) 98.4 F (36.9 C)   TempSrc: Oral Oral Oral   Resp: 18 15 16    Height:      Weight:    80.559 kg (177 lb 9.6 oz)  SpO2: 93% 95% 97%     Intake/Output Summary (Last 24 hours) at 10/19/12 1310 Last data filed at 10/19/12 1030  Gross per 24 hour  Intake    680 ml  Output   4000 ml  Net  -3320 ml   Filed Weights   10/14/12 1745 10/15/12 1200 10/19/12 0641  Weight: 89.268 kg (196 lb 12.8 oz) 88.225 kg (194 lb 8 oz) 80.559 kg (177 lb 9.6 oz)    Exam:   General:  NAD  Cardiovascular: RRR  Respiratory: Right basilar crackles.  Abdomen: Soft/NT/ND/+BS  Extremities: No clubbing cyanosis status post right foot transmetatarsal wrapped in a bandage. Trace lower extremity edema.  Data Reviewed: Basic Metabolic Panel:  Recent Labs Lab 10/13/12 0150 10/14/12 0347 10/16/12 0431 10/18/12 0405 10/19/12 0356  NA 135 135 134* 135 138  K 5.1 5.0 3.8 3.6 3.4*  CL 106 106 105 103 102  CO2 23 22 21 26 29   GLUCOSE 247* 138* 119* 144* 148*  BUN 24* 21 21 23 22    CREATININE 1.00 1.09 1.15* 1.21* 1.16*  CALCIUM 11.4* 10.3 10.4 10.2 10.5   Liver Function Tests: No results found for this basename: AST, ALT, ALKPHOS, BILITOT, PROT, ALBUMIN,  in the last 168 hours No results found for this basename: LIPASE, AMYLASE,  in the last 168 hours No results found for this basename: AMMONIA,  in the last 168 hours CBC:  Recent Labs Lab 10/13/12 0150 10/14/12 0347 10/16/12 0431 10/18/12 0405 10/19/12 1235  WBC 6.8 7.0 9.3 7.6 10.9*  NEUTROABS 4.5  --   --   --   --   HGB 9.7* 9.0* 9.6* 9.2* 9.8*  HCT 31.7* 30.4* 31.6* 29.9* 31.9*  MCV 81.7 83.1 81.9 81.9 80.4  PLT 267 260 246 223 235   Cardiac Enzymes:  Recent Labs Lab 10/13/12 0150  TROPONINI <0.30   BNP (last 3 results)  Recent Labs  07/17/12 0738 07/20/12 0520 10/15/12 2008  PROBNP 4495.0* 4108.0* 19285.0*   CBG:  Recent Labs Lab 10/18/12 1213 10/18/12 1627 10/18/12 2114 10/19/12 0723 10/19/12 1211  GLUCAP 137* 129* 146* 140* 194*    Recent Results (from the past 240 hour(s))  MRSA PCR SCREENING     Status: None   Collection Time    10/14/12  7:00 PM      Result Value Range Status   MRSA by PCR NEGATIVE  NEGATIVE Final   Comment:            The GeneXpert MRSA Assay (FDA     approved for NASAL specimens     only), is one component of a     comprehensive MRSA colonization     surveillance program. It is not     intended to diagnose MRSA     infection nor to guide or     monitor treatment for     MRSA infections.     Studies: No results found.  Scheduled Meds: . antiseptic oral rinse  15 mL Mouth Rinse BID  . aspirin EC  81 mg Oral Daily  . atorvastatin  10 mg Oral q1800  . carvedilol  6.25 mg Oral BID WC  . enoxaparin (LOVENOX) injection  1 mg/kg Subcutaneous Q12H  . famotidine  20 mg Oral Daily  . furosemide  80 mg Oral BID  . insulin aspart  0-15 Units Subcutaneous TID WC  . insulin aspart  0-5 Units Subcutaneous QHS  . isosorbide mononitrate  30 mg Oral  Daily  . potassium chloride  40 mEq Oral Once  . spironolactone  12.5 mg Oral Daily  . warfarin  7.5 mg Oral ONCE-1800  . Warfarin - Pharmacist Dosing Inpatient   Does not apply q1800   Continuous Infusions: . 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 ischemic Rt foot   CAD, severe 3V  at cath 07/05/12. She needs CABG   Anemia of chronic disease   Chronic combined systolic and diastolic CHF (congestive heart failure)   Chronic kidney disease, stage 3   Medical non-compliance secondary to finances   Hypoxemia   Hypokalemia    Time spent: > 35 MINS    [facility]  [facility] Pager [phone]. If 8PM-8AM, please contact night-coverage at [email or web address], password [facility] 10/19/2012, 1:10 PM  LOS: 6 days

## 2012-10-19 NOTE — Progress Notes (Signed)
SATURATION QUALIFICATIONS: (This note is used to comply with regulatory documentation for home oxygen)  Patient Saturations on Room Air at Rest = 92%  Patient Saturations on Room Air while Ambulating (sat up at bed side)= 88%  Patient Saturations on 2 Liters of oxygen while Ambulating (sat up at bed side) = 88*%  Please briefly explain why patient needs home oxygen: Pt sat up on the side of the bed. Unable to ambulate at this time due to Transmetatarsal Amputation

## 2012-10-19 NOTE — Anesthesia Postprocedure Evaluation (Signed)
Anesthesia Post Note  Patient: [name] [name]  Procedure(s) Performed: Procedure(s) (LRB): AMPUTATION FOOT (Right)  Anesthesia type: General  Patient location: PACU  Post pain: Pain level controlled  Post assessment: Post-op Vital signs reviewed  Last Vitals:  Filed Vitals:   10/19/12 1327  BP: 101/60  Pulse: 91  Temp: 36.3 C  Resp: 17    Post vital signs: Reviewed  Level of consciousness: sedated  Complications: No apparent anesthesia complications

## 2012-10-20 LAB — CBC
Hemoglobin: 9.1 g/dL — ABNORMAL LOW (ref 12.0–15.0)
MCH: 25.1 pg — ABNORMAL LOW (ref 26.0–34.0)
RBC: 3.62 MIL/uL — ABNORMAL LOW (ref 3.87–5.11)
WBC: 10.8 10*3/uL — ABNORMAL HIGH (ref 4.0–10.5)

## 2012-10-20 LAB — BASIC METABOLIC PANEL
CO2: 32 mEq/L (ref 19–32)
Calcium: 10.5 mg/dL (ref 8.4–10.5)
Glucose, Bld: 177 mg/dL — ABNORMAL HIGH (ref 70–99)
Sodium: 138 mEq/L (ref 135–145)

## 2012-10-20 LAB — GLUCOSE, CAPILLARY
Glucose-Capillary: 173 mg/dL — ABNORMAL HIGH (ref 70–99)
Glucose-Capillary: 157 mg/dL — ABNORMAL HIGH (ref 70–99)

## 2012-10-20 LAB — PROTIME-INR: Prothrombin Time: 21.2 seconds — ABNORMAL HIGH (ref 11.6–15.2)

## 2012-10-20 MED ORDER — POTASSIUM CHLORIDE CRYS ER 20 MEQ PO TBCR
40.0000 meq | EXTENDED_RELEASE_TABLET | ORAL | Status: DC
  Administered 2012-10-20: 40 meq via ORAL
  Filled 2012-10-20 (×2): qty 2

## 2012-10-20 MED ORDER — DOXYCYCLINE HYCLATE 100 MG PO TABS: 100.0000 mg | ORAL_TABLET | Freq: Two times a day (BID) | ORAL | Status: AC

## 2012-10-20 MED ORDER — FUROSEMIDE 40 MG PO TABS: 40.0000 mg | ORAL_TABLET | Freq: Two times a day (BID) | ORAL | Status: DC

## 2012-10-20 MED ORDER — DSS 100 MG PO CAPS: 100.0000 mg | ORAL_CAPSULE | Freq: Two times a day (BID) | ORAL | Status: DC | PRN

## 2012-10-20 MED ORDER — OXYCODONE-ACETAMINOPHEN 5-325 MG PO TABS: 1.0000 | ORAL_TABLET | ORAL | Status: DC | PRN

## 2012-10-20 MED ORDER — WARFARIN SODIUM 5 MG PO TABS: 5.0000 mg | ORAL_TABLET | Freq: Every day | ORAL | Status: DC

## 2012-10-20 MED ORDER — LEVOFLOXACIN 500 MG PO TABS: 500.0000 mg | ORAL_TABLET | Freq: Every day | ORAL | Status: AC

## 2012-10-20 NOTE — Discharge Summary (Signed)
Physician Discharge Summary  [name] [name] ZOX:[id number] DOB: 01-25-1946 DOA: 10/13/2012  PCP: No primary provider on file.  Admit date: 10/13/2012 Discharge date: 10/20/2012  Time spent: 70 minutes  Recommendations for Outpatient Follow-up:  1. Patient is to followup with Dr. Magnus Ivan of orthopedics 2 weeks post discharge. 2. Patient will be called by cardiology for outpatient followup with Dr. Royann Shivers. Patient will also be called for followup in the Coumadin clinic. On followup patient will need a basic metabolic profile to followup on electrolytes and renal function. Patient's systolic heart failure and it to be assessed at that time. 3. Patient will be given information to followup with her PCP post discharge.  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 She needs CABG   Anemia of chronic disease   Chronic combined systolic and diastolic CHF (congestive heart failure)   Chronic kidney disease, stage 3   Medical non-compliance secondary to finances   Hypoxemia   Hypokalemia   Discharge Condition: Stable and improved  Diet recommendation: Heart healthy  Filed Weights   10/14/12 1745 10/15/12 1200 10/19/12 0641  Weight: 89.268 kg (196 lb 12.8 oz) 88.225 kg (194 lb 8 oz) 80.559 kg (177 lb 9.6 oz)    History of present illness:  [name] [name] is an 67 y.o. female with ischemic dilated CMP EF 25%, multivessel CAD needing CABG, DM2 uncontrolled, with ischemic right foot and cellulitis being treated, presents to ER [facility] complaining of feeling malaise and right foot cellulitis. Evaluation in the ER showed CXR with vascular  congestion, with atelectasis vs infiltrate, normal WBC and Hb of 9.7 gr/DL, Calcium of 81.1, with normal renal fx tests. Hospitalist was asked to admit her for cellulitis.   Hospital Course:  #1 hypoxemia  During the hospitalization patient was noted to be hypoxic with sats of 88%. Unknown etiology.  Patient does have a history of DVT status post transmetatarsal amputation with a subtherapeutic INR. Patient also with a history of CHF. Due to concern for possible PE a d-dimer was obtained which came back elevated. Chest x-ray was ordered however patient refused chest x-ray. Patient stated that she did not have any chest pain and denied any shortness of breath. Patient adamantly refused any further workup in terms of her hypoxemia. Patient was subsequently placed on full dose Lovenox until INR was therapeutic. Patient was also continued on diuretics with Lasix. Home oxygen was ordered for the patient. On day of discharge patient's iron was 1.92. Patient was given a full dose Lovenox prior to discharge and patient was discharged on Coumadin to followup in the Coumadin clinic with close followup. Patient was discharged in stable and improved condition. #2 right forefoot ischemia with necrosis and dry gangrene  Admission patient had presented with a cellulitis of the right forefoot which was necrotic and consistent with dry gangrene. X-rays of the foot done was concerning for osteomyelitis. An orthopedic consultation was obtained and patient was seen in consultation by Dr. Magnus Ivan on 10/15/2012. It was recommended per orthopedics that patient undergo a right foot transmetatarsal amputation. Patient subsequently underwent a right foot transmetatarsal amputation 10/16/2012. Patient received 5 days  of IV vancomycin. Patient was subsequently started on doxycycline 100 mg twice daily x2 weeks to cover for possible community-acquired MRSA, also Levaquin for 2 weeks orally for  pseudomonas coverage per curbside  recommendations of ID. patient improved clinically remained afebrile. Patient will be discharged home in stable and improved condition with followup with orthopedics as outpatient.  #3 chronic combined systolic and diastolic CHF  Patient was seen by cardiology during hospitalization for preop clearance and noted to be  Volume overload. Patient was diuresed with increase in lasix and aldactone with good diureses. Patient was continued on her imdur and coreg. Patient improved clinically and will be discharged on Lasix 40mg  BID. Patient will follow up cardiology as outpatient. #4 questionable possible community-acquired pneumonia  On admission due to concern for possible pneumonia patient was treated with a five-day course of  Levaquin.  #5 uncontrolled type 2 diabetes  Hemoglobin A1c is 8.8. CBGs have ranged from 129-194. Continued on sliding scale insulin.  #6 coronary artery disease  Stable. Patient is currently asymptomatic. Continued on aspirin, Lipitor, Coreg, Imdur, Lasix,  #7 chronic kidney disease stage III  Remained stable.  #8 anemia of chronic disease  H&H is stable follow.  #9 history of popliteal DVT  Coumadin was held prior to surgery. INR was subtherapeutic postop. Patient was started on and overlapped with coumadin. INR on day of discharge was 1.92. Patient was given full dose Lovenox prior to discharge and maintained on her Coumadin. Patient is to followup in Coumadin clinic as outpatient.  #10 hypokalemia  Repleted. Will need a basic metabolic profile done as outpatient.     Procedures: Status post right foot transmetatarsal amputation 10/16/2012 Dr. Juanda Chance of the thoracic spine 10/13/2012  X-ray of the right foot 10/13/2012  Chest x-ray 10/13/2012  X-ray of the left hip 10/13/2012   Consultations: Orthopedics: Dr. Magnus Ivan 10/15/2012  Cardiology: Dr. Herbie Baltimore 10/15/2012   Discharge Exam: Filed Vitals:   10/19/12 1327 10/19/12 2115 10/20/12 0532 10/20/12  0952  BP: 101/60 101/50 107/60 114/63  Pulse: 91 86 86 91  Temp: 97.3 F (36.3 C) 98.6 F (37 C) 98.8 F (37.1 C) 98.8 F (37.1 C)  TempSrc: Oral Oral Oral Oral  Resp: 17 16 18 17   Height:      Weight:      SpO2: 93% 98% 92% 98%    General: NAD Cardiovascular: RRR. No lower extremity edema. Respiratory: CTAB anterior lung fields.  Discharge Instructions  Discharge Orders   Future Orders Complete By Expires     Diet - low sodium heart healthy  As directed     Discharge instructions  As directed     Comments:      Follow up with orthopedics as scheduled. Follow up with cardiology as scheduled.    Increase activity slowly  As directed         Medication List    TAKE these medications       acetaminophen 325 MG tablet  Commonly known as:  TYLENOL  Take 2 tablets (650 mg total) by mouth every 4 (four) hours as needed.     aspirin 81 MG EC tablet  Take 1 tablet (81 mg total) by mouth daily.     atorvastatin 10 MG tablet  Commonly known as:  LIPITOR  Take 1 tablet (10 mg total) by mouth daily at 6 PM.     carvedilol 6.25 MG tablet  Commonly known as:  COREG  Take 1 tablet (6.25 mg total) by  mouth 2 (two) times daily with a meal.     doxycycline 100 MG tablet  Commonly known as:  VIBRA-TABS  Take 1 tablet (100 mg total) by mouth every 12 (twelve) hours. Take for 13 days.     DSS 100 MG Caps  Take 100 mg by mouth 2 (two) times daily as needed.     furosemide 40 MG tablet  Commonly known as:  LASIX  Take 1 tablet (40 mg total) by mouth 2 (two) times daily.     insulin aspart 100 UNIT/ML injection  Commonly known as:  novoLOG  Inject 0-9 Units into the skin 3 (three) times daily with meals.     isosorbide mononitrate 30 MG 24 hr tablet  Commonly known as:  IMDUR  Take 1 tablet (30 mg total) by mouth daily.     levofloxacin 500 MG tablet  Commonly known as:  LEVAQUIN  Take 1 tablet (500 mg total) by mouth daily. Take for 13 days.     LORazepam 0.5 MG  tablet  Commonly known as:  ATIVAN  Take 0.5 tablets (0.25 mg total) by mouth every 12 (twelve) hours as needed for anxiety.     metFORMIN 500 MG tablet  Commonly known as:  GLUCOPHAGE  Take 1 tablet (500 mg total) by mouth 2 (two) times daily with a meal.     oxyCODONE-acetaminophen 5-325 MG per tablet  Commonly known as:  PERCOCET/ROXICET  Take 1-2 tablets by mouth every 4 (four) hours as needed.     ranitidine 150 MG tablet  Commonly known as:  ZANTAC  Take 150 mg by mouth 2 (two) times daily.     spironolactone 12.5 mg Tabs  Commonly known as:  ALDACTONE  Take 0.5 tablets (12.5 mg total) by mouth daily.     warfarin 5 MG tablet  Commonly known as:  COUMADIN  Take 1 tablet (5 mg total) by mouth daily.           Follow-up Information   Follow up with Kathryne Hitch, MD In 2 weeks.   Contact information:   [address] [place] [address] [place] [address] [phone]       Follow up with Abelino Derrick, PA. (office will contact you)    Contact information:   [address] [place] [address] [phone]       Follow up with Thurmon Fair, MD On 10/26/2012. (8:30 am at [facility] office)    Contact information:   [address] [place] [address] [phone]       Follow up In 1 week.   Contact information:   [place] [facility]([phone]        The results of significant diagnostics from this hospitalization (including imaging, microbiology, ancillary and laboratory) are listed below for reference.    Significant Diagnostic Studies: Dg Chest 1 View  10/13/2012  *RADIOLOGY REPORT*  Clinical Data: Fall, back pain  CHEST - 1 VIEW  Comparison: 07/18/2012  Findings: Heart size upper normal.  Central vascular congestion. Increased perihilar markings.  Small right pleural effusion.  Mild bibasilar opacities.  No pneumothorax.  No acute osseous finding.  IMPRESSION: Cardiomegaly with  central vascular congestion.  Increased interstitial markings may reflect atypical infection or edema.  Small right pleural effusion.  Bibasilar opacities; atelectasis versus infiltrate.   Original Report Authenticated By: Jearld Lesch, M.D.    Dg Thoracic Spine 2 View  10/13/2012  *RADIOLOGY REPORT*  Clinical Data: Fall,  back pain  THORACIC SPINE - 2 VIEW  Comparison: 07/08/2012  Findings: Limited by cross-table lateral technique.  Diffuse osteopenia.  Mild degenerative changes.  No displaced fracture identified. Alignment maintained.  IMPRESSION: No displaced fracture identified. If clinical concern for fracture persists, recommend cross-sectional imaging through the focal level of concern.  Mild multilevel degenerative changes.   Original Report Authenticated By: Jearld Lesch, M.D.    Dg Hip Complete Left  10/13/2012  *RADIOLOGY REPORT*  Clinical Data: Left hip pain  LEFT HIP - COMPLETE 2+ VIEW  Comparison: None.  Findings: Diffuse osteopenia.  No displaced fracture or dislocation.  No aggressive osseous lesions.  IMPRESSION: No displaced left hip fracture.  Osteopenia.  MRI recommended if clinical concern for nondisplaced fracture persists.   Original Report Authenticated By: Jearld Lesch, M.D.    Dg Foot Complete Right  10/13/2012  *RADIOLOGY REPORT*  Clinical Data: Diabetic, painful toes.  RIGHT FOOT COMPLETE - 3+ VIEW  Comparison: None.  Findings: Diffuse osteopenia.  Periosteal reaction along the medial margin of the second metatarsal. Irregularity/defect at the tip of the distal phalanx second digit.  No acute fracture or dislocation. No radiopaque foreign body.  The dorsal soft tissue swelling.  IMPRESSION: Periosteal reaction along the second metatarsal and deformity/defect at the tip of the second distal phalanx.  Osteomyelitis not excluded. MRI has increased sensitivity.   Original Report Authenticated By: Jearld Lesch, M.D.     Microbiology: Recent Results (from the past  240 hour(s))  MRSA PCR SCREENING     Status: None   Collection Time    10/14/12  7:00 PM      Result Value Range Status   MRSA by PCR NEGATIVE  NEGATIVE Final   Comment:            The GeneXpert MRSA Assay (FDA     approved for NASAL specimens     only), is one component of a     comprehensive MRSA colonization     surveillance program. It is not     intended to diagnose MRSA     infection nor to guide or     monitor treatment for     MRSA infections.     Labs: Basic Metabolic Panel:  Recent Labs Lab 10/14/12 0347 10/16/12 0431 10/18/12 0405 10/19/12 0356 10/20/12 0407  NA 135 134* 135 138 138  K 5.0 3.8 3.6 3.4* 3.2*  CL 106 105 103 102 98  CO2 22 21 26 29  32  GLUCOSE 138* 119* 144* 148* 177*  BUN 21 21 23 22 21   CREATININE 1.09 1.15* 1.21* 1.16* 1.03  CALCIUM 10.3 10.4 10.2 10.5 10.5   Liver Function Tests: No results found for this basename: AST, ALT, ALKPHOS, BILITOT, PROT, ALBUMIN,  in the last 168 hours No results found for this basename: LIPASE, AMYLASE,  in the last 168 hours No results found for this basename: AMMONIA,  in the last 168 hours CBC:  Recent Labs Lab 10/14/12 0347 10/16/12 0431 10/18/12 0405 10/19/12 1235 10/20/12 0407  WBC 7.0 9.3 7.6 10.9* 10.8*  HGB 9.0* 9.6* 9.2* 9.8* 9.1*  HCT 30.4* 31.6* 29.9* 31.9* 29.1*  MCV 83.1 81.9 81.9 80.4 80.4  PLT 260 246 223 235 241   Cardiac Enzymes: No results found for this basename: CKTOTAL, CKMB, CKMBINDEX, TROPONINI,  in the last 168 hours BNP: BNP (last 3 results)  Recent Labs  07/17/12 0738 07/20/12 0520 10/15/12 2008  PROBNP 4495.0* 4108.0* 19285.0*  CBG:  Recent Labs Lab 10/19/12 0723 10/19/12 1211 10/19/12 1713 10/19/12 2130 10/20/12 0803  GLUCAP 140* 194* 183* 139* 173*       Signed:  THOMPSON,DANIEL  [facility] 10/20/2012, 12:04 PM

## 2012-10-20 NOTE — Progress Notes (Signed)
Patient ID: [name] [name], female   DOB: 02-27-1946, 67 y.o.   MRN: [id number] Right foot still looks good post-transmetatarsal amputation.  Suture line intact.  No further necrosis for now.  No drainage. Can put weight on heal.  Will still follow, but can go to SNF from ortho standpoint.  Will leave d/c instructions.

## 2012-10-20 NOTE — Progress Notes (Signed)
PT Cancellation Note  Patient Details Name: [name] [name] MRN: [id number] DOB: [birth year]   Cancelled Treatment:    Reason Eval/Treat Not Completed: Fatigue/lethargy limiting ability to participate;Other (comment) ("I really don't want to do this today") Pt states that the "doctory changed the dressing and it bled".  So she thinks that means she should not get out of bed today.  She also said she threw up all night. Encouraged pt to participate stating that she has to exercise and get up if she wants to go home and she said "I know", but still did not want to participate. I have not seen family and question if they will be able to provide level of care patient needs for home discharge, especially if she is unable to participate with mobility.  Will discuss with nursing  Donnetta Hail 10/20/2012, 9:51 AM

## 2012-10-20 NOTE — Progress Notes (Signed)
Patient to be discharged home via ambulance, all discharge instructions and medications reviewed and questions answered. Information reviewed with significant other.

## 2012-10-20 NOTE — Progress Notes (Signed)
CSW received notification that pt needing non-emergency ambulance (PTAR) transportation home at discharge.  CSW confirmed address and provided needed documents in [place] for ambulance transport.  RN plans to call for PTAR (ph #: [phone]) when discharge instructions are reviewed and discharge teaching completed.   No further social work needs identified at this time.  CSW signing off.   Jacklynn Lewis, MSW, LCSWA  Clinical Social Work [phone]

## 2012-10-20 NOTE — Care Management (Signed)
MD orders placed for [facility] services and DME. CM spoke with [facility] rep concerning MD order for [facility] to draw PT/INR 10/21/12. Pt oxygen saturation at rest 88%. MD order for home oxygen therapy. [place] with [facility] contacted concerning DME. Equipment delivery scheduled to room prior to discharge.   Roxy Manns Shawnell Dykes,RN,BSN [phone]

## 2012-12-30 ENCOUNTER — Encounter (HOSPITAL_COMMUNITY): Payer: Self-pay | Admitting: *Deleted

## 2012-12-30 ENCOUNTER — Other Ambulatory Visit (HOSPITAL_COMMUNITY): Payer: Self-pay | Admitting: Orthopaedic Surgery

## 2012-12-30 NOTE — Progress Notes (Signed)
Eccho,chest x ray, EKG 2/14 EPIC, stress test 11/13 with cath 11/13 EPIC.

## 2012-12-30 NOTE — Progress Notes (Signed)
Spoke with patient regarding surgery 01/05/13- patient states can not come for PST appt as doesn't ambulate and must be transported by EMS.   Patient states HAS NOT BEEN BACK TO CARDIOLOGIST FOR FOLLOW UP AFTER 2/14 HOSPITALIZATION-  STATES "FIRED THE [facility]ER GROUP AND IS NOT GOING BACK" STATES SENT LIFE BACK VEST TO THE COMPANY AND HAS NEVER TAKEN COUMADIN IN HER LIFE TIME.  I called Dr Burna Cash office as she was to follow up there- was told she has cancelled or no showed for multiple appointments at their  office in Jan, Feb, and March. States she does not have a primary care physician at present as social services is "working on things for her". Left voice mail with Cordelia Pen at Dr Vevelyn Royals 12/30/12 1645 and also faxed this note to Dr Magnus Ivan through [facility] for his information

## 2013-01-03 NOTE — Progress Notes (Signed)
Notified patient of surgery change time and to be at short stay 0900

## 2013-01-05 ENCOUNTER — Encounter (HOSPITAL_COMMUNITY): Payer: Self-pay | Admitting: *Deleted

## 2013-01-05 ENCOUNTER — Encounter (HOSPITAL_COMMUNITY)
Admission: RE | Disposition: A | Discharge status: Skilled Nursing Facility | Payer: Self-pay | Source: Ambulatory Visit | Attending: Orthopaedic Surgery

## 2013-01-05 ENCOUNTER — Inpatient Hospital Stay (HOSPITAL_COMMUNITY): Payer: Medicare Other

## 2013-01-05 ENCOUNTER — Encounter (HOSPITAL_COMMUNITY): Payer: Self-pay | Admitting: Anesthesiology

## 2013-01-05 ENCOUNTER — Inpatient Hospital Stay (HOSPITAL_COMMUNITY): Payer: Medicare Other | Admitting: Anesthesiology

## 2013-01-05 ENCOUNTER — Inpatient Hospital Stay (HOSPITAL_COMMUNITY)
Admission: RE | Admit: 2013-01-05 | Discharge: 2013-01-09 | DRG: 475 | Disposition: A | Discharge status: Skilled Nursing Facility | Payer: Medicare Other | Source: Ambulatory Visit | Attending: Orthopaedic Surgery | Admitting: Orthopaedic Surgery

## 2013-01-05 DIAGNOSIS — Z89439 Acquired absence of unspecified foot: Secondary | ICD-10-CM

## 2013-01-05 DIAGNOSIS — T8789 Other complications of amputation stump: Principal | ICD-10-CM | POA: Diagnosis present

## 2013-01-05 DIAGNOSIS — K219 Gastro-esophageal reflux disease without esophagitis: Secondary | ICD-10-CM | POA: Diagnosis present

## 2013-01-05 DIAGNOSIS — E871 Hypo-osmolality and hyponatremia: Secondary | ICD-10-CM | POA: Diagnosis present

## 2013-01-05 DIAGNOSIS — IMO0002 Reserved for concepts with insufficient information to code with codable children: Secondary | ICD-10-CM | POA: Diagnosis present

## 2013-01-05 DIAGNOSIS — Z89431 Acquired absence of right foot: Secondary | ICD-10-CM

## 2013-01-05 DIAGNOSIS — E118 Type 2 diabetes mellitus with unspecified complications: Secondary | ICD-10-CM | POA: Diagnosis present

## 2013-01-05 DIAGNOSIS — L8992 Pressure ulcer of unspecified site, stage 2: Secondary | ICD-10-CM | POA: Diagnosis present

## 2013-01-05 DIAGNOSIS — I428 Other cardiomyopathies: Secondary | ICD-10-CM | POA: Diagnosis present

## 2013-01-05 DIAGNOSIS — L89109 Pressure ulcer of unspecified part of back, unspecified stage: Secondary | ICD-10-CM | POA: Diagnosis present

## 2013-01-05 DIAGNOSIS — I7389 Other specified peripheral vascular diseases: Secondary | ICD-10-CM | POA: Diagnosis present

## 2013-01-05 DIAGNOSIS — M869 Osteomyelitis, unspecified: Secondary | ICD-10-CM | POA: Diagnosis present

## 2013-01-05 DIAGNOSIS — I2510 Atherosclerotic heart disease of native coronary artery without angina pectoris: Secondary | ICD-10-CM | POA: Diagnosis present

## 2013-01-05 DIAGNOSIS — Y835 Amputation of limb(s) as the cause of abnormal reaction of the patient, or of later complication, without mention of misadventure at the time of the procedure: Secondary | ICD-10-CM | POA: Diagnosis present

## 2013-01-05 HISTORY — PX: I & D EXTREMITY: SHX5045

## 2013-01-05 HISTORY — DX: Personal history of other medical treatment: Z92.89

## 2013-01-05 HISTORY — DX: Gastro-esophageal reflux disease without esophagitis: K21.9

## 2013-01-05 LAB — CBC
Hemoglobin: 11.3 g/dL — ABNORMAL LOW (ref 12.0–15.0)
MCH: 25.5 pg — ABNORMAL LOW (ref 26.0–34.0)
MCV: 79.5 fL (ref 78.0–100.0)
Platelets: 238 10*3/uL (ref 150–400)
RBC: 4.44 MIL/uL (ref 3.87–5.11)
WBC: 6.9 10*3/uL (ref 4.0–10.5)

## 2013-01-05 LAB — GLUCOSE, CAPILLARY
Glucose-Capillary: 127 mg/dL — ABNORMAL HIGH (ref 70–99)
Glucose-Capillary: 153 mg/dL — ABNORMAL HIGH (ref 70–99)
Glucose-Capillary: 133 mg/dL — ABNORMAL HIGH (ref 70–99)

## 2013-01-05 LAB — BASIC METABOLIC PANEL
CO2: 24 mEq/L (ref 19–32)
Glucose, Bld: 134 mg/dL — ABNORMAL HIGH (ref 70–99)
Potassium: 4.5 mEq/L (ref 3.5–5.1)
Sodium: 133 mEq/L — ABNORMAL LOW (ref 135–145)

## 2013-01-05 LAB — SURGICAL PCR SCREEN
MRSA, PCR: NEGATIVE
Staphylococcus aureus: POSITIVE — AB

## 2013-01-05 SURGERY — IRRIGATION AND DEBRIDEMENT EXTREMITY
Anesthesia: General | Laterality: Right | Wound class: Clean Contaminated

## 2013-01-05 MED ORDER — OXYCODONE HCL 5 MG PO TABS
5.0000 mg | ORAL_TABLET | ORAL | Status: DC | PRN
  Administered 2013-01-05 (×2): 5 mg via ORAL
  Filled 2013-01-05 (×2): qty 1

## 2013-01-05 MED ORDER — FENTANYL CITRATE 0.05 MG/ML IJ SOLN: 25.0000 ug | INTRAMUSCULAR | Status: DC | PRN

## 2013-01-05 MED ORDER — LACTATED RINGERS IV SOLN
INTRAVENOUS | Status: DC
  Administered 2013-01-05: 1000 mL via INTRAVENOUS

## 2013-01-05 MED ORDER — ONDANSETRON HCL 4 MG/2ML IJ SOLN: 4.0000 mg | Freq: Four times a day (QID) | INTRAMUSCULAR | Status: DC | PRN

## 2013-01-05 MED ORDER — HYDROCODONE-ACETAMINOPHEN 5-325 MG PO TABS: 1.0000 | ORAL_TABLET | ORAL | Status: DC | PRN

## 2013-01-05 MED ORDER — EPHEDRINE SULFATE 50 MG/ML IJ SOLN
INTRAMUSCULAR | Status: DC | PRN
  Administered 2013-01-05: 10 mg via INTRAVENOUS
  Administered 2013-01-05: 25 mg via INTRAVENOUS
  Administered 2013-01-05: 15 mg via INTRAVENOUS

## 2013-01-05 MED ORDER — MORPHINE SULFATE 2 MG/ML IJ SOLN
INTRAMUSCULAR | Status: AC
  Filled 2013-01-05: qty 1

## 2013-01-05 MED ORDER — MORPHINE SULFATE 2 MG/ML IJ SOLN
1.0000 mg | INTRAMUSCULAR | Status: DC | PRN
  Administered 2013-01-05: 1 mg via INTRAVENOUS

## 2013-01-05 MED ORDER — ONDANSETRON HCL 4 MG/2ML IJ SOLN
INTRAMUSCULAR | Status: DC | PRN
  Administered 2013-01-05: 4 mg via INTRAVENOUS

## 2013-01-05 MED ORDER — METOCLOPRAMIDE HCL 10 MG PO TABS: 5.0000 mg | ORAL_TABLET | Freq: Three times a day (TID) | ORAL | Status: DC | PRN

## 2013-01-05 MED ORDER — INSULIN ASPART 100 UNIT/ML ~~LOC~~ SOLN: 0.0000 [IU] | Freq: Every day | SUBCUTANEOUS | Status: DC

## 2013-01-05 MED ORDER — ZOLPIDEM TARTRATE 5 MG PO TABS: 5.0000 mg | ORAL_TABLET | Freq: Every evening | ORAL | Status: DC | PRN

## 2013-01-05 MED ORDER — ASPIRIN EC 81 MG PO TBEC
81.0000 mg | DELAYED_RELEASE_TABLET | Freq: Every day | ORAL | Status: DC
  Administered 2013-01-05 – 2013-01-09 (×5): 81 mg via ORAL
  Filled 2013-01-05 (×5): qty 1

## 2013-01-05 MED ORDER — SODIUM CHLORIDE 0.9 % IV SOLN
INTRAVENOUS | Status: DC
  Administered 2013-01-05: 23:00:00 via INTRAVENOUS

## 2013-01-05 MED ORDER — MUPIROCIN 2 % EX OINT: TOPICAL_OINTMENT | Freq: Two times a day (BID) | CUTANEOUS | Status: DC

## 2013-01-05 MED ORDER — FENTANYL CITRATE 0.05 MG/ML IJ SOLN
INTRAMUSCULAR | Status: DC | PRN
  Administered 2013-01-05: 50 ug via INTRAVENOUS
  Administered 2013-01-05 (×2): 25 ug via INTRAVENOUS

## 2013-01-05 MED ORDER — MEPERIDINE HCL 50 MG/ML IJ SOLN: 6.2500 mg | INTRAMUSCULAR | Status: DC | PRN

## 2013-01-05 MED ORDER — MUPIROCIN 2 % EX OINT
TOPICAL_OINTMENT | CUTANEOUS | Status: AC
  Administered 2013-01-05: 10:00:00
  Filled 2013-01-05: qty 22

## 2013-01-05 MED ORDER — VANCOMYCIN HCL IN DEXTROSE 1-5 GM/200ML-% IV SOLN
1000.0000 mg | INTRAVENOUS | Status: AC
  Administered 2013-01-05: 1000 mg via INTRAVENOUS

## 2013-01-05 MED ORDER — CLINDAMYCIN PHOSPHATE 600 MG/50ML IV SOLN
600.0000 mg | Freq: Four times a day (QID) | INTRAVENOUS | Status: AC
  Administered 2013-01-05 – 2013-01-06 (×3): 600 mg via INTRAVENOUS
  Filled 2013-01-05 (×3): qty 50

## 2013-01-05 MED ORDER — ONDANSETRON HCL 4 MG PO TABS: 4.0000 mg | ORAL_TABLET | Freq: Four times a day (QID) | ORAL | Status: DC | PRN

## 2013-01-05 MED ORDER — METOCLOPRAMIDE HCL 5 MG/ML IJ SOLN
INTRAMUSCULAR | Status: DC | PRN
  Administered 2013-01-05: 10 mg via INTRAVENOUS

## 2013-01-05 MED ORDER — KETAMINE HCL 10 MG/ML IJ SOLN
INTRAMUSCULAR | Status: DC | PRN
  Administered 2013-01-05: 30 mg via INTRAVENOUS

## 2013-01-05 MED ORDER — FAMOTIDINE 20 MG PO TABS
20.0000 mg | ORAL_TABLET | Freq: Two times a day (BID) | ORAL | Status: DC
  Administered 2013-01-05 – 2013-01-09 (×8): 20 mg via ORAL
  Filled 2013-01-05 (×10): qty 1

## 2013-01-05 MED ORDER — PROPOFOL 10 MG/ML IV BOLUS
INTRAVENOUS | Status: DC | PRN
  Administered 2013-01-05: 90 mg via INTRAVENOUS

## 2013-01-05 MED ORDER — ACETAMINOPHEN 10 MG/ML IV SOLN
INTRAVENOUS | Status: DC | PRN
  Administered 2013-01-05: 1000 mg via INTRAVENOUS

## 2013-01-05 MED ORDER — INSULIN ASPART 100 UNIT/ML ~~LOC~~ SOLN
0.0000 [IU] | Freq: Three times a day (TID) | SUBCUTANEOUS | Status: DC
  Administered 2013-01-05: 3 [IU] via SUBCUTANEOUS
  Administered 2013-01-07 – 2013-01-08 (×2): 2 [IU] via SUBCUTANEOUS

## 2013-01-05 MED ORDER — METOCLOPRAMIDE HCL 5 MG/ML IJ SOLN: 5.0000 mg | Freq: Three times a day (TID) | INTRAMUSCULAR | Status: DC | PRN

## 2013-01-05 MED ORDER — ISOSORBIDE MONONITRATE ER 30 MG PO TB24
30.0000 mg | ORAL_TABLET | Freq: Every day | ORAL | Status: DC | PRN
  Filled 2013-01-05: qty 1

## 2013-01-05 MED ORDER — METHOCARBAMOL 500 MG PO TABS
500.0000 mg | ORAL_TABLET | Freq: Four times a day (QID) | ORAL | Status: DC | PRN
  Administered 2013-01-05 – 2013-01-07 (×5): 500 mg via ORAL
  Filled 2013-01-05 (×5): qty 1

## 2013-01-05 MED ORDER — ESMOLOL HCL 10 MG/ML IV SOLN
INTRAVENOUS | Status: DC | PRN
  Administered 2013-01-05 (×2): 10 mg via INTRAVENOUS

## 2013-01-05 MED ORDER — PROMETHAZINE HCL 25 MG/ML IJ SOLN: 6.2500 mg | INTRAMUSCULAR | Status: DC | PRN

## 2013-01-05 MED ORDER — ENOXAPARIN SODIUM 40 MG/0.4ML ~~LOC~~ SOLN
40.0000 mg | SUBCUTANEOUS | Status: DC
  Administered 2013-01-05 – 2013-01-08 (×4): 40 mg via SUBCUTANEOUS
  Filled 2013-01-05 (×5): qty 0.4

## 2013-01-05 MED ORDER — METHOCARBAMOL 100 MG/ML IJ SOLN
500.0000 mg | Freq: Four times a day (QID) | INTRAVENOUS | Status: DC | PRN
  Filled 2013-01-05: qty 5

## 2013-01-05 MED ORDER — SODIUM CHLORIDE 0.9 % IR SOLN
Status: DC | PRN
  Administered 2013-01-05: 3000 mL

## 2013-01-05 MED ORDER — DIPHENHYDRAMINE HCL 12.5 MG/5ML PO ELIX: 12.5000 mg | ORAL_SOLUTION | ORAL | Status: DC | PRN

## 2013-01-05 MED ORDER — METFORMIN HCL 500 MG PO TABS
500.0000 mg | ORAL_TABLET | Freq: Two times a day (BID) | ORAL | Status: DC
  Administered 2013-01-05 – 2013-01-09 (×6): 500 mg via ORAL
  Filled 2013-01-05 (×10): qty 1

## 2013-01-05 SURGICAL SUPPLY — 39 items
BAG SPEC THK2 15X12 ZIP CLS (MISCELLANEOUS) ×1
BAG ZIPLOCK 12X15 (MISCELLANEOUS) ×2 IMPLANT
BANDAGE ELASTIC 4 VELCRO ST LF (GAUZE/BANDAGES/DRESSINGS) ×1 IMPLANT
BANDAGE ESMARK 6X9 LF (GAUZE/BANDAGES/DRESSINGS) ×1 IMPLANT
BANDAGE GAUZE ELAST BULKY 4 IN (GAUZE/BANDAGES/DRESSINGS) ×2 IMPLANT
BLADE OSCILLATING/SAGITTAL (BLADE) ×2
BLADE SW THK.38XMED LNG THN (BLADE) IMPLANT
BNDG CMPR 9X6 STRL LF SNTH (GAUZE/BANDAGES/DRESSINGS) ×1
BNDG ESMARK 6X9 LF (GAUZE/BANDAGES/DRESSINGS) ×2
CLOTH BEACON ORANGE TIMEOUT ST (SAFETY) ×2 IMPLANT
CUFF TOURN SGL QUICK 18 (TOURNIQUET CUFF) IMPLANT
CUFF TOURN SGL QUICK 24 (TOURNIQUET CUFF)
CUFF TOURN SGL QUICK 34 (TOURNIQUET CUFF)
CUFF TRNQT CYL 24X4X40X1 (TOURNIQUET CUFF) IMPLANT
CUFF TRNQT CYL 34X4X40X1 (TOURNIQUET CUFF) IMPLANT
DRAIN PENROSE 18X1/2 LTX STRL (DRAIN) ×1 IMPLANT
DRSG PAD ABDOMINAL 8X10 ST (GAUZE/BANDAGES/DRESSINGS) ×3 IMPLANT
DURAPREP 26ML APPLICATOR (WOUND CARE) ×1 IMPLANT
ELECT REM PT RETURN 9FT ADLT (ELECTROSURGICAL) ×2
ELECTRODE REM PT RTRN 9FT ADLT (ELECTROSURGICAL) ×1 IMPLANT
GLOVE BIO SURGEON STRL SZ7.5 (GLOVE) ×2 IMPLANT
GLOVE BIOGEL PI IND STRL 8 (GLOVE) ×1 IMPLANT
GLOVE BIOGEL PI INDICATOR 8 (GLOVE) ×1
GLOVE ECLIPSE 8.0 STRL XLNG CF (GLOVE) ×2 IMPLANT
GOWN STRL REIN XL XLG (GOWN DISPOSABLE) ×2 IMPLANT
HANDPIECE INTERPULSE COAX TIP (DISPOSABLE) ×2
KIT BASIN OR (CUSTOM PROCEDURE TRAY) ×2 IMPLANT
PACK LOWER EXTREMITY WL (CUSTOM PROCEDURE TRAY) ×2 IMPLANT
PAD CAST 4YDX4 CTTN HI CHSV (CAST SUPPLIES) ×1 IMPLANT
PADDING CAST COTTON 4X4 STRL (CAST SUPPLIES) ×2
POSITIONER SURGICAL ARM (MISCELLANEOUS) ×1 IMPLANT
SET HNDPC FAN SPRY TIP SCT (DISPOSABLE) ×1 IMPLANT
SPONGE GAUZE 4X4 12PLY (GAUZE/BANDAGES/DRESSINGS) ×1 IMPLANT
SPONGE LAP 18X18 X RAY DECT (DISPOSABLE) ×1 IMPLANT
SUT ETHILON 2 0 PS N (SUTURE) ×1 IMPLANT
SUT ETHILON 2 0 PSLX (SUTURE) ×1 IMPLANT
SYR CONTROL 10ML LL (SYRINGE) ×1 IMPLANT
TOWEL OR 17X26 10 PK STRL BLUE (TOWEL DISPOSABLE) ×4 IMPLANT
TRAY PREP A LATEX SAFE STRL (SET/KITS/TRAYS/PACK) ×1 IMPLANT

## 2013-01-05 NOTE — H&P (Signed)
[name] [name] is an 67 y.o. female.   Chief Complaint:   Right chronic foot wound HPI:   67 yo female with diabetes and PVD who developed right forefoot ischemia several months ago eventually undergoing a transmetatarsal amputation.  She had now developed a chronic wound dorsally with exposed bone.  She has failed conservative treatment measures and wound care.  She now presents for additional surgery to her foot to try to get wound healing.  Past Medical History  Diagnosis Date  . 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 Diabetes mellitus type 2, uncontrolled, with complications     Very poorly controlled  . DVT, bilateral lower limbs 05/2012  . 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 GERD (gastroesophageal reflux disease)   . History of blood transfusion   . Coronary artery disease     Severe Multivessel Disase; 3 V Disease wtih LAD distribution Viability &  inferior scar.    Past Surgical History  Procedure Laterality Date  . Vaginal hysterectomy  ~ 1980  . Cesarean section  1979  . Lumbar disc surgery  1980's  . Facial reconstruction surgery    . Cardiac catheterization    . Amputation Right 10/16/2012    Procedure: AMPUTATION FOOT;  Surgeon: Kathryne Hitch, MD;  Location: [facility] ORS;  Service: Orthopedics;  Laterality: Right;  transmetatarsal amp    Family History  Problem Relation Age of Onset  . Cancer Mother   . CAD Mother   . Cancer Father   . CAD Father    Social History:  reports that she has quit smoking. Her smoking use included Cigarettes. She has a 10 pack-year smoking history. She has never used smokeless tobacco. She reports that she does not drink alcohol or use illicit drugs.  Allergies:  Allergies  Allergen Reactions  . Penicillins Anaphylaxis  . Sulfa Antibiotics Anaphylaxis    Medications Prior to Admission  Medication Sig Dispense Refill  . acetaminophen  (TYLENOL) 500 MG tablet Take 500 mg by mouth every 6 (six) hours as needed for pain.      Marland Kitchen aspirin EC 81 MG EC tablet Take 1 tablet (81 mg total) by mouth daily.      Marland Kitchen bismuth subsalicylate (PEPTO BISMOL) 262 MG/15ML suspension Take 30 mLs by mouth every 6 (six) hours as needed for indigestion (stomach).      . docusate sodium (COLACE) 100 MG capsule Take 100 mg by mouth daily as needed for constipation.      . isosorbide mononitrate (IMDUR) 30 MG 24 hr tablet Take 30 mg by mouth daily as needed (for weakness).      . metFORMIN (GLUCOPHAGE) 500 MG tablet Take 500 mg by mouth 2 (two) times daily.       . ranitidine (ZANTAC) 150 MG tablet Take 150 mg by mouth daily as needed for heartburn.        Results for orders placed during the hospital encounter of 01/05/13 (from the past 48 hour(s))  BASIC METABOLIC PANEL     Status: Abnormal   Collection Time    01/05/13  9:30 AM      Result Value Range   Sodium 133 (*) 135 - 145 mEq/L   Potassium 4.5  3.5 - 5.1 mEq/L   Chloride 103  96 - 112 mEq/L   CO2 24  19 - 32 mEq/L   Glucose, Bld 134 (*) 70 - 99  mg/dL   BUN 17  6 - 23 mg/dL   Creatinine, Ser 4.78  0.50 - 1.10 mg/dL   Calcium 29.5 (*) 8.4 - 10.5 mg/dL   GFR calc non Af Amer 86 (*) >90 mL/min   GFR calc Af Amer >90  >90 mL/min   Comment:            The eGFR has been calculated     using the CKD EPI equation.     This calculation has not been     validated in all clinical     situations.     eGFR's persistently     <90 mL/min signify     possible Chronic Kidney Disease.  CBC     Status: Abnormal   Collection Time    01/05/13  9:30 AM      Result Value Range   WBC 6.9  4.0 - 10.5 K/uL   RBC 4.44  3.87 - 5.11 MIL/uL   Hemoglobin 11.3 (*) 12.0 - 15.0 g/dL   HCT 62.1 (*) 30.8 - 65.7 %   MCV 79.5  78.0 - 100.0 fL   MCH 25.5 (*) 26.0 - 34.0 pg   MCHC 32.0  30.0 - 36.0 g/dL   RDW 84.6 (*) 96.2 - 95.2 %   Platelets 238  150 - 400 K/uL  GLUCOSE, CAPILLARY     Status: Abnormal    Collection Time    01/05/13  9:36 AM      Result Value Range   Glucose-Capillary 127 (*) 70 - 99 mg/dL   Comment 1 Documented in Chart     No results found.  Review of Systems  All other systems reviewed and are negative.    Blood pressure 120/75, pulse 83, temperature 97.3 F (36.3 C), temperature source Oral, resp. rate 16, height 5\' 6"  (1.676 m), weight 77.111 kg (170 lb), SpO2 96.00%. Physical Exam  Constitutional: She is oriented to person, place, and time. She appears well-developed and well-nourished.  HENT:  Head: Normocephalic and atraumatic.  Eyes: EOM are normal. Pupils are equal, round, and reactive to light.  Neck: Normal range of motion. Neck supple.  Cardiovascular: Normal rate and regular rhythm.   Respiratory: Effort normal and breath sounds normal.  GI: Soft. Bowel sounds are normal.  Musculoskeletal:       Feet:  Neurological: She is alert and oriented to person, place, and time.  Skin: Skin is warm and dry.  Psychiatric: She has a normal mood and affect.     Assessment/Plan Post right foot transmetatarsal amputation with chronic wound and exposed bone 1) to the OR today for an irrigation and debridement of her right foot wound and possible revision transmetatarsal amputation.  BLACKMAN,CHRISTOPHER Y 01/05/2013, 10:08 AM

## 2013-01-05 NOTE — Progress Notes (Signed)
Stage II pressure ulcer on sacrum. Nickel size. Drainage serosanguinous. Alleveyn dressing applied and barrier ointment to area.
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
## 2013-01-05 NOTE — Brief Op Note (Signed)
01/05/2013  12:25 PM  PATIENT:  [name] [name]  67 y.o. female  PRE-OPERATIVE DIAGNOSIS:  Right foot chronic wound  POST-OPERATIVE DIAGNOSIS:  Right foot chronic wound  PROCEDURE:  Procedure(s): I & D Right foot, Revision Transmet Amputation (Right)  SURGEON:  Surgeon(s) and Role:    * Kathryne Hitch, MD - Primary  PHYSICIAN ASSISTANT: Rexene Edison, PA-C  ANESTHESIA:   general  EBL:     BLOOD ADMINISTERED:none  DRAINS: none   LOCAL MEDICATIONS USED:  NONE  SPECIMEN:  No Specimen  DISPOSITION OF SPECIMEN:  N/A  COUNTS:  YES  TOURNIQUET:  * No tourniquets in log *  DICTATION: .Other Dictation: Dictation Number [id number]  PLAN OF CARE: Admit to inpatient   PATIENT DISPOSITION:  PACU - hemodynamically stable.   Delay start of Pharmacological VTE agent (>24hrs) due to surgical blood loss or risk of bleeding: no

## 2013-01-05 NOTE — Progress Notes (Signed)
[facility]  Patient Status: Active (receiving services up to time of hospitalization)  [facility] is providing the following services: RN and HHA  If patient discharges after hours, please call [phone].   [name] [name] 01/05/2013, 2:34 PM

## 2013-01-05 NOTE — Anesthesia Postprocedure Evaluation (Signed)
  Anesthesia Post-op Note  Patient: [name] [name]  Procedure(s) Performed: Procedure(s) (LRB): I & D Right foot, Revision Transmet Amputation (Right)  Patient Location: PACU  Anesthesia Type: General  Level of Consciousness: awake and alert   Airway and Oxygen Therapy: Patient Spontanous Breathing  Post-op Pain: mild  Post-op Assessment: Post-op Vital signs reviewed, Patient's Cardiovascular Status Stable, Respiratory Function Stable, Patent Airway and No signs of Nausea or vomiting  Last Vitals:  Filed Vitals:   01/05/13 1333  BP: 112/74  Pulse: 105  Temp: 36.4 C  Resp: 15    Post-op Vital Signs: stable   Complications: No apparent anesthesia complications

## 2013-01-05 NOTE — Transfer of Care (Signed)
Immediate Anesthesia Transfer of Care Note  Patient: [name] [name]  Procedure(s) Performed: Procedure(s): I & D Right foot, Revision Transmet Amputation (Right)  Patient Location: PACU  Anesthesia Type:General  Level of Consciousness: responds to stimulation, drowsy, ventilating well,   Airway & Oxygen Therapy: Patient Spontanous Breathing and Patient connected to face mask oxygen  Post-op Assessment: Report given to PACU RN, Post -op Vital signs reviewed and stable and Patient moving all extremities  Post vital signs: Reviewed and stable  Complications: No apparent anesthesia complications
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
## 2013-01-05 NOTE — Anesthesia Preprocedure Evaluation (Signed)
Anesthesia Evaluation  Patient identified by MRN, date of birth, ID band Patient awake    Reviewed: Allergy & Precautions, H&P , NPO status , Patient's Chart, lab work & pertinent test results  Airway Mallampati: III TM Distance: <3 FB Neck ROM: Full    Dental  (+) Teeth Intact, Poor Dentition, Partial Upper and Dental Advisory Given   Pulmonary shortness of breath, former smoker,  breath sounds clear to auscultation  Pulmonary exam normal       Cardiovascular + CAD (EF 25%; severe 3 vessel CAD), + Peripheral Vascular Disease, +CHF and DVT + dysrhythmias Atrial Fibrillation Rhythm:Regular Rate:Normal  LBBB  Ischemic cardiomyopathy, EF 25% Oct 2013- improved to 30% by 07/20/12 echo   Neuro/Psych negative neurological ROS  negative psych ROS   GI/Hepatic negative GI ROS, Neg liver ROS,   Endo/Other  diabetes, Poorly Controlled, Type 2, Oral Hypoglycemic Agents  Renal/GU Renal diseasenegative Renal ROS  negative genitourinary   Musculoskeletal negative musculoskeletal ROS (+)   Abdominal   Peds  Hematology negative hematology ROS (+) Elevated PTT   Anesthesia Other Findings   Reproductive/Obstetrics negative OB ROS                           Anesthesia Physical  Anesthesia Plan  ASA: III and emergent  Anesthesia Plan: General   Post-op Pain Management:    Induction: Intravenous  Airway Management Planned: LMA  Additional Equipment:   Intra-op Plan:   Post-operative Plan: Extubation in OR  Informed Consent: I have reviewed the patients History and Physical, chart, labs and discussed the procedure including the risks, benefits and alternatives for the proposed anesthesia with the patient or authorized representative who has indicated his/her understanding and acceptance.   Dental advisory given  Plan Discussed with: CRNA  Anesthesia Plan Comments:         Anesthesia Quick  Evaluation

## 2013-01-05 NOTE — Preoperative (Signed)
Beta Blockers   Reason not to administer Beta Blockers:Not Applicable, not on home BB 

## 2013-01-06 ENCOUNTER — Encounter (HOSPITAL_COMMUNITY): Payer: Self-pay | Admitting: Orthopaedic Surgery

## 2013-01-06 LAB — BASIC METABOLIC PANEL
Chloride: 103 mEq/L (ref 96–112)
GFR calc Af Amer: 77 mL/min — ABNORMAL LOW (ref >90)
GFR calc non Af Amer: 66 mL/min — ABNORMAL LOW (ref >90)
Potassium: 4.7 mEq/L (ref 3.5–5.1)
Sodium: 135 mEq/L (ref 135–145)

## 2013-01-06 LAB — GLUCOSE, CAPILLARY: Glucose-Capillary: 124 mg/dL — ABNORMAL HIGH (ref 70–99)

## 2013-01-06 MED ORDER — BLISTEX EX OINT
TOPICAL_OINTMENT | CUTANEOUS | Status: AC
  Administered 2013-01-06: 1
  Filled 2013-01-06: qty 10

## 2013-01-06 MED ORDER — TRAMADOL HCL 50 MG PO TABS
50.0000 mg | ORAL_TABLET | Freq: Four times a day (QID) | ORAL | Status: DC | PRN
  Administered 2013-01-06 – 2013-01-08 (×2): 50 mg via ORAL
  Filled 2013-01-06 (×3): qty 1

## 2013-01-06 NOTE — Progress Notes (Signed)
Patient ID: [name] [name], female   DOB: Jun 21, 1946, 67 y.o.   MRN: [id number] Has stage II sacral decub ulcer.  Will ask for recs from wound nurse.

## 2013-01-06 NOTE — Progress Notes (Signed)
Utilization review completed.  

## 2013-01-06 NOTE — Op Note (Signed)
[name] [name], SUBER NO.:  [id number]  MEDICAL RECORD NO.:  [id number]  LOCATION:  1615                         FACILITY:  [facility]  PHYSICIAN:  Vanita Panda. 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  PROCEDURE: 1. Irrigation and debridement of right foot transmetatarsal amputation     site including debridement of skin, soft tissue, and bone. 2. Revision transmetatarsal amputation with closure of wound, right     foot.  SURGEON:  Vanita Panda. Magnus Ivan, M.D.  ASSISTANT:  Richardean Canal, PA-C.  ANESTHESIA:  General.  BLOOD LOSS:  Less than 100 mL.  COMPLICATIONS:  None.  INDICATIONS:  [name] [name] is a 68 year old female in poor medical status who several months ago underwent a transmetatarsal amputation due to peripheral vascular disease, chronic wound, and infection.  She had since dehisced the wound and did not have good follow up in the office. We tried chronic wound management and getting the wound clean, but continued to have dehiscence and was starting to get worrisome for further necrosis and possible infection.  We recommended she undergo irrigation and debridement of this with revising the bone enough to get the wound closed.  She understands this and does wish to proceed.  PROCEDURE DESCRIPTION:  After informed consent was obtained, appropriate right foot was marked.  She was brought to the operating room, placed supine on the operating table.  General anesthesia was then obtained. Her right foot was prepped and draped with Betadine scrub and paint. Time-out called and she was  identified as the correct patient, correct right foot.  I then used Esmarch around the ankle as a local tourniquet. We used a #10 blade to excise the skin sharply at the open wound from the previous surgery to remove necrotic skin.  We then used a small oscillating saw to back the metatarsals up more towards the mid foot with removing the necrotic bone as well.  Next, when that was done, we removed some soft tissue deep sharply with a knife and used pulsatile lavage to lavage the foot thoroughly with 3 L of normal saline solution. We removed the Esmarch from the ankle and she had good bleeding tissue. We then rearranged the soft tissue and reapproximated the plantar based flap up anterior and used interrupted 2-0 nylon to close this loosely and we did close completely over bone.  We then placed Xeroform and well-padded sterile dressing.  She was awakened, extubated, and taken to the recovery room in stable condition.  All final counts were correct.  There were no complications noted.     Vanita Panda. Magnus Ivan, M.D.     CYB/MEDQ  D:  01/05/2013  T:  01/06/2013  Job:  [id number]

## 2013-01-06 NOTE — Progress Notes (Signed)
PT Cancellation Note  Patient Details Name: [name] [name] MRN: [id number] DOB: Mar 08, 1946   Cancelled Treatment:    Reason Eval/Treat Not Completed: Pain limiting ability to participate, Pt very groggy, slurred speeh, tearful. Noted to have apparent pains,mwrithing in bed,  even after meds. Will continue in AM .Blanchard Kelch PT [phone]   Rada Hay 01/06/2013, 3:15 PM

## 2013-01-06 NOTE — Progress Notes (Signed)
Subjective: 1 Day Post-Op Procedure(s) (LRB): I & D Right foot, Revision Transmet Amputation (Right) Patient reports pain as mild.    Objective: Vital signs in last 24 hours: Temp:  [97.2 F (36.2 C)-98.9 F (37.2 C)] 98.3 F (36.8 C) (05/16 0637) Pulse Rate:  [83-105] 103 (05/16 0637) Resp:  [13-17] 16 (05/16 0637) BP: (106-134)/(70-83) 106/70 mmHg (05/16 0637) SpO2:  [94 %-100 %] 94 % (05/16 0637) Weight:  [77.111 kg (170 lb)] 77.111 kg (170 lb) (05/15 1333)  Intake/Output from previous day: 05/15 0701 - 05/16 0700 In: 1422.5 [I.V.:1422.5] Out: 550 [Urine:550] Intake/Output this shift:     Recent Labs  01/05/13 0930  HGB 11.3*    Recent Labs  01/05/13 0930  WBC 6.9  RBC 4.44  HCT 35.3*  PLT 238    Recent Labs  01/05/13 0930 01/06/13 0358  NA 133* 135  K 4.5 4.7  CL 103 103  CO2 24 27  BUN 17 17  CREATININE 0.77 0.89  GLUCOSE 134* 144*  CALCIUM 11.9* 11.7*   No results found for this basename: LABPT, INR,  in the last 72 hours  Incision: dressing C/D/I  Assessment/Plan: 1 Day Post-Op Procedure(s) (LRB): I & D Right foot, Revision Transmet Amputation (Right) Up with therapy  [name] [name] Y 01/06/2013, 7:32 AM

## 2013-01-06 NOTE — Consult Note (Addendum)
WOC consult Note Reason for Consult: Sacral wound Wound type: Stage II, could easily develop into a Stage III related to protruding bone at site under thin layer of skin.  Pt states she sits in recliner for long periods of time prior to admission and at times stays wet, using Depends at home. Pressure Ulcer POA: Yes, patient states she had area prior to admission Measurement: 1cm X 0.75cm X 0.3cm Wound bed: 80% red, 20% dry peeling skin surrounding Drainage (amount, consistency, odor): No drainage noted, no odor. Periwound: Intact, pink. Dressing procedure/placement/frequency:  Foam dressing in place to absorb moisture and promote healing. To remain on for 5 days or change prn if soiled.  Discussed importance of frequent repositioning and staying off bottom with patient. Discussed preventive measures including avoiding use of diapers or sleeping in recliner, repositioning to reduce pressure to sacrum, and protecting skin from moisture. [name] RN, MSN Student Please re-consult if further assistance is needed.  Thank-you,  [name] MSN, RN, CWOCN, [place], CNS [phone]

## 2013-01-06 NOTE — Progress Notes (Signed)
01/06/2013 Ikram Riebe BSN RN CCM [phone] CM SPOKE WITH PATIENT WHO IS ALONE IN ROOM. Pt giving inappropriate response or no responses to questions.  Pt questioned as where she will be going upon discharge and who will be caregiver. She states she would be going to CM's home and was unable to tell CM who caregiver is. Unable to answer further questions regarding discharge plans. Reported pt's responses to care coordinator.  01/05/2013 Colleen Can BSN RN CCM [phone] Received call from [facility] rep and advised that patient is active with their services-[facility] and [facility] aid. CM will follow up for [facility] needs.

## 2013-01-07 LAB — BASIC METABOLIC PANEL
BUN: 23 mg/dL (ref 6–23)
CO2: 25 mEq/L (ref 19–32)
Chloride: 104 mEq/L (ref 96–112)
Creatinine, Ser: 1.05 mg/dL (ref 0.50–1.10)
Creatinine, Ser: 1.16 mg/dL — ABNORMAL HIGH (ref 0.50–1.10)
GFR calc Af Amer: 56 mL/min — ABNORMAL LOW (ref >90)
GFR calc non Af Amer: 48 mL/min — ABNORMAL LOW (ref >90)
Sodium: 133 mEq/L — ABNORMAL LOW (ref 135–145)

## 2013-01-07 LAB — CBC
HCT: 34.3 % — ABNORMAL LOW (ref 36.0–46.0)
MCHC: 30 g/dL (ref 30.0–36.0)
MCV: 81.1 fL (ref 78.0–100.0)
RDW: 19.9 % — ABNORMAL HIGH (ref 11.5–15.5)

## 2013-01-07 LAB — GLUCOSE, CAPILLARY

## 2013-01-07 MED ORDER — BISACODYL 10 MG RE SUPP
10.0000 mg | Freq: Once | RECTAL | Status: AC
  Administered 2013-01-07: 10 mg via RECTAL
  Filled 2013-01-07: qty 1

## 2013-01-07 MED ORDER — VITAMINS A & D EX OINT
TOPICAL_OINTMENT | CUTANEOUS | Status: AC
  Administered 2013-01-07: 10:00:00
  Filled 2013-01-07: qty 5

## 2013-01-07 NOTE — Progress Notes (Signed)
Subjective: 2 Days Post-Op Procedure(s) (LRB): I & D Right foot, Revision Transmet Amputation (Right) Patient reports pain as mild.    Objective: Vital signs in last 24 hours: Temp:  [97.3 F (36.3 C)-98.5 F (36.9 C)] 98.5 F (36.9 C) (05/17 0550) Pulse Rate:  [94-103] 94 (05/17 0550) Resp:  [14-18] 14 (05/17 0550) BP: (104-112)/(67-77) 112/74 mmHg (05/17 0550) SpO2:  [98 %-100 %] 99 % (05/17 0550)  Intake/Output from previous day: 05/16 0701 - 05/17 0700 In: 0  Out: 850 [Urine:850] Intake/Output this shift:     Recent Labs  01/05/13 0930  HGB 11.3*    Recent Labs  01/05/13 0930  WBC 6.9  RBC 4.44  HCT 35.3*  PLT 238    Recent Labs  01/06/13 0358 01/07/13 0519  NA 135 133*  K 4.7 5.3*  CL 103 104  CO2 27 25  BUN 17 21  CREATININE 0.89 1.05  GLUCOSE 144* 115*  CALCIUM 11.7* 12.1*   No results found for this basename: LABPT, INR,  in the last 72 hours  Incision: dressing C/D/I  Assessment/Plan: 2 Days Post-Op Procedure(s) (LRB): I & D Right foot, Revision Transmet Amputation (Right) Up with therapy Plan to discharge on Monday  Ketih Goodie Y 01/07/2013, 10:42 AM

## 2013-01-07 NOTE — Evaluation (Signed)
Physical Therapy Evaluation Patient Details Name: [name] [name] MRN: [id number] DOB: 09/05/1945 Today's Date: 01/07/2013 Time: 1210-1238 PT Time Calculation (min): 28 min  PT Assessment / Plan / Recommendation Clinical Impression  67 yo female admitted 01/05/13 for revision of R trans met amp. Pt reports falling at home, poor historian related to function PTA. Pt will benefit from PT while in acute care. Pt  stood with 2 persons but potential to putting weight on R forefoot so maximove used for OOB> rec. snf.    PT Assessment  Patient needs continued PT services    Follow Up Recommendations  SNF    Does the patient have the potential to tolerate intense rehabilitation      Barriers to Discharge        Equipment Recommendations  None recommended by PT    Recommendations for Other Services     Frequency Min 3X/week    Precautions / Restrictions Precautions Precautions: Fall Restrictions RLE Weight Bearing: Non weight bearing   Pertinent Vitals/Pain ' It hurts' premedicated.      Mobility  Bed Mobility Bed Mobility: Supine to Sit Supine to Sit: 1: +2 Total assist Supine to Sit: Patient Percentage: 10% Details for Bed Mobility Assistance: multimodal cues to   get to edge of bed. Transfers Transfers: Sit to Stand;Stand to [employer] via Lift Equipment: Maximove Details for Transfer Assistance: attemped to stand, pt was unable to stand with potential weight on  R foot.  Ambulation/Gait Ambulation/Gait Assistance: Not tested (comment)    Exercises     PT Diagnosis: Generalized weakness;Acute pain  PT Problem List: Decreased strength;Decreased range of motion;Decreased activity tolerance;Decreased mobility;Decreased knowledge of precautions;Decreased safety awareness;Pain PT Treatment Interventions: DME instruction;Functional mobility training;Therapeutic activities;Therapeutic exercise;Patient/family education   PT Goals Acute Rehab PT Goals PT Goal  Formulation: Patient unable to participate in goal setting Time For Goal Achievement: 01/21/13 Potential to Achieve Goals: Fair Pt will go Supine/Side to Sit: with supervision PT Goal: Supine/Side to Sit - Progress: Goal set today Pt will go Sit to Supine/Side: with supervision PT Goal: Sit to Supine/Side - Progress: Goal set today Pt will Transfer Bed to Chair/Chair to Bed: with mod assist PT Transfer Goal: Bed to Chair/Chair to Bed - Progress: Goal set today Pt will Propel Wheelchair: 51 - 150 feet;with supervision PT Goal: Propel Wheelchair - Progress: Goal set today  Visit Information  Last PT Received On: 01/07/13 Assistance Needed: +2    Subjective Data  Subjective: I can't stand up Patient Stated Goal: pt unable to state   Prior Functioning       Cognition  Cognition Arousal/Alertness: Lethargic Behavior During Therapy: Anxious Overall Cognitive Status: Difficult to assess Area of Impairment: Following commands;Safety/judgement;Awareness;Problem solving;Attention Following Commands: Follows one step commands inconsistently Safety/Judgement: Decreased awareness of deficits;Decreased awareness of safety Problem Solving: Slow processing;Requires verbal cues    Extremity/Trunk Assessment     Balance Balance Balance Assessed: Yes Static Sitting Balance Static Sitting - Balance Support: Bilateral upper extremity supported;Feet supported Static Sitting - Level of Assistance: 5: Stand by assistance  End of Session PT - End of Session Activity Tolerance: Patient limited by fatigue;Patient limited by pain Patient left: in chair;with call bell/phone within reach Nurse Communication: Need for lift equipment  GP     Rada Hay 01/07/2013, 3:36 PM  Blanchard Kelch PT [phone]

## 2013-01-08 LAB — GLUCOSE, CAPILLARY
Glucose-Capillary: 103 mg/dL — ABNORMAL HIGH (ref 70–99)
Glucose-Capillary: 89 mg/dL (ref 70–99)

## 2013-01-08 LAB — BASIC METABOLIC PANEL
BUN: 25 mg/dL — ABNORMAL HIGH (ref 6–23)
Creatinine, Ser: 1.07 mg/dL (ref 0.50–1.10)
GFR calc Af Amer: 61 mL/min — ABNORMAL LOW (ref >90)
GFR calc non Af Amer: 53 mL/min — ABNORMAL LOW (ref >90)

## 2013-01-08 MED ORDER — VANCOMYCIN HCL IN DEXTROSE 750-5 MG/150ML-% IV SOLN
750.0000 mg | Freq: Two times a day (BID) | INTRAVENOUS | Status: DC
  Administered 2013-01-08 – 2013-01-09 (×2): 750 mg via INTRAVENOUS
  Filled 2013-01-08 (×3): qty 150

## 2013-01-08 MED ORDER — VANCOMYCIN HCL 10 G IV SOLR
1750.0000 mg | Freq: Once | INTRAVENOUS | Status: AC
  Administered 2013-01-08: 1750 mg via INTRAVENOUS
  Filled 2013-01-08: qty 1750

## 2013-01-08 MED ORDER — GLUCERNA SHAKE PO LIQD
237.0000 mL | Freq: Three times a day (TID) | ORAL | Status: DC
  Administered 2013-01-08 – 2013-01-09 (×2): 237 mL via ORAL
  Filled 2013-01-08 (×5): qty 237

## 2013-01-08 NOTE — Progress Notes (Signed)
Clinical Social Work Department CLINICAL SOCIAL WORK PLACEMENT NOTE 01/08/2013  Patient:  [name] [name], [name] [name]  Account Number:  [id number] Admit date:  01/05/2013  Clinical Social Worker:  Doroteo Glassman  Date/time:  01/08/2013 03:26 PM  Clinical Social Work is seeking post-discharge placement for this patient at the following level of care:   SKILLED NURSING   (*CSW will update this form in Epic as items are completed)     Patient/family provided with [facility] Department of Clinical Social Work's list of facilities offering this level of care within the geographic area requested by the patient (or if unable, by the patient's family).    Patient/family informed of their freedom to choose among providers that offer the needed level of care, that participate in Medicare, Medicaid or managed care program needed by the patient, have an available bed and are willing to accept the patient.    Patient/family informed of [facility]' ownership interest in [facility], as well as of the fact that they are under no obligation to receive care at this facility.  PASARR submitted to EDS on 01/08/2013 PASARR number received from EDS on 01/08/2013  FL2 transmitted to all facilities in geographic area requested by pt/family on   FL2 transmitted to all facilities within larger geographic area on   Patient informed that his/her managed care company has contracts with or will negotiate with  certain facilities, including the following:     Patient/family informed of bed offers received:   Patient chooses bed at  Physician recommends and patient chooses bed at    Patient to be transferred to  on   Patient to be transferred to facility by   The following physician request were entered in Epic:   Additional Comments:  Providence Crosby, LCSWA Clinical Social Work [phone]

## 2013-01-08 NOTE — Progress Notes (Signed)
Subjective: 3 Days Post-Op Procedure(s) (LRB): I & D Right foot, Revision Transmet Amputation (Right) Patient reports pain as mild.  Patient slightly more subdued today. Alert and oriented no acute distress.  Objective: Vital signs in last 24 hours: Temp:  [97.8 F (36.6 C)-98.5 F (36.9 C)] 98.5 F (36.9 C) (05/18 0557) Pulse Rate:  [94-100] 95 (05/18 0557) Resp:  [15-20] 20 (05/18 0557) BP: (105-127)/(72-81) 127/81 mmHg (05/18 0557) SpO2:  [94 %-99 %] 94 % (05/18 0557) FiO2 (%):  [3 %] 3 % (05/17 2115)  Intake/Output from previous day: 05/17 0701 - 05/18 0700 In: 120 [P.O.:120] Out: 300 [Urine:300] Intake/Output this shift:     Recent Labs  01/07/13 1930  HGB 10.3*    Recent Labs  01/07/13 1930  WBC 14.2*  RBC 4.23  HCT 34.3*  PLT 252    Recent Labs  01/07/13 1930 01/08/13 0510  NA 131* 129*  K 5.4* 5.1  CL 102 101  CO2 25 23  BUN 23 25*  CREATININE 1.16* 1.07  GLUCOSE 125* 101*  CALCIUM 12.5* 12.8*   No results found for this basename: LABPT, INR,  in the last 72 hours  Incision: no drainage no gross signs of infection Skin well approximated with sutures  Assessment/Plan: 3 Days Post-Op Procedure(s) (LRB): I & D Right foot, Revision Transmet Amputation (Right)  Hyponatremia will monitor  Will offer sodas  Vancomycin per pharmacy  Elevate right leg above heart float heels  Check sodium in AM Jianna Drabik 01/08/2013, 9:42 AM

## 2013-01-08 NOTE — Progress Notes (Signed)
ANTIBIOTIC CONSULT NOTE - INITIAL  Pharmacy Consult for Vancomycin Indication: S/p right foot transmetatarsal amputation with repeat I&D   Allergies  Allergen Reactions  . Penicillins Anaphylaxis  . Sulfa Antibiotics Anaphylaxis    Patient Measurements: Height: 5\' 6"  (167.6 cm) Weight: 170 lb (77.111 kg) IBW/kg (Calculated) : 59.3  Vital Signs: Temp: 98.5 F (36.9 C) (05/18 0557) Temp src: Oral (05/18 0557) BP: 127/81 mmHg (05/18 0557) Pulse Rate: 95 (05/18 0557) Intake/Output from previous day: 05/17 0701 - 05/18 0700 In: 120 [P.O.:120] Out: 300 [Urine:300] Intake/Output from this shift:    Labs:  Recent Labs  01/07/13 0519 01/07/13 1930 01/08/13 0510  WBC  --  14.2*  --   HGB  --  10.3*  --   PLT  --  252  --   CREATININE 1.05 1.16* 1.07   Estimated Creatinine Clearance: 54.2 ml/min (by C-G formula based on Cr of 1.07). No results found for this basename: VANCOTROUGH, Leodis Binet, VANCORANDOM, GENTTROUGH, GENTPEAK, GENTRANDOM, TOBRATROUGH, TOBRAPEAK, TOBRARND, AMIKACINPEAK, AMIKACINTROU, AMIKACIN,  in the last 72 hours   Microbiology: Recent Results (from the past 720 hour(s))  SURGICAL PCR SCREEN     Status: Abnormal   Collection Time    01/05/13  9:21 AM      Result Value Range Status   MRSA, PCR NEGATIVE  NEGATIVE Final   Staphylococcus aureus POSITIVE (*) NEGATIVE Final   Comment:            The Xpert SA Assay (FDA     approved for NASAL specimens     in patients over 67 years of age),     is one component of     a comprehensive surveillance     program.  Test performance has     been validated by [employer] for patients greater     than or equal to 67 year old.     It is not intended     to diagnose infection nor to     guide or monitor treatment.    Medical History: Past Medical History  Diagnosis Date  . 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 Diabetes  mellitus type 2, uncontrolled, with complications     Very poorly controlled  . DVT, bilateral lower limbs 05/2012  . 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 GERD (gastroesophageal reflux disease)   . History of blood transfusion   . Coronary artery disease     Severe Multivessel Disase; 3 V Disease wtih LAD distribution Viability &  inferior scar.    Assessment: 67 yoF with CHF, CAD, ICM, diabetes and PVD who underwent right foot transmetatarsal amputation in February 2014.  Pt admitted on 5/15 for repeat I&D and revision to wound site and pharmacy has been consulted to start Vancomycin.  Pt has allergies to penicillins (anaphylaxis) and sulfa (anaphylaxis) antibiotics.  Clindamycin and Vancomycin given pre-op, and 2 doses clindamycin given post-op.     Renal function: CKD -III, SCr stable, CrCl ~54 ml/min (Normalized 58)  Weight = 77.1kg, Height = 5'6"  WBC elevated @ 14.2  Afebrile  No cultures, MRSA PCR negative  Goal of Therapy:  Vancomycin trough level 15-20 mcg/ml  Plan:  1.  Vancomycin 1750mg  IV x 1 (loading dose), then 750mg  IV q 12 hours (maintenance dose).  2.  F/u renal fxn, clinical course  Haynes Hoehn, PharmD 01/08/2013 10:10 AM  Pager:  [phone]   

## 2013-01-08 NOTE — Progress Notes (Signed)
Attempted to meet with Pt.  Pt in a lot of pain and asked that I come back later.  Providence Crosby, LCSWA Clinical Social Work [phone]

## 2013-01-08 NOTE — Care Management Note (Signed)
    Page 1 of 1   01/08/2013     5:30:59 PM   CARE MANAGEMENT NOTE 01/08/2013  Patient:  [name] [name], [name] [name]   Account Number:  [id number]  Date Initiated:  01/05/2013  Documentation initiated by:  Colleen Can  Subjective/Objective Assessment:   dx chronic rt foot wound; Incision drainage rt foot, revision transmet amputation     Action/Plan:   Anticipated DC Date:  01/09/2013   Anticipated DC Plan:  SKILLED NURSING FACILITY      DC Planning Services  CM consult      Choice offered to / List presented to:             Status of service:  In process, will continue to follow Medicare Important Message given?   (If response is "NO", the following Medicare IM given date fields will be blank) Date Medicare IM given:   Date Additional Medicare IM given:    Discharge Disposition:    Per UR Regulation:    If discussed at Long Length of Stay Meetings, dates discussed:    Comments:  01/08/13 Michella Detjen RN,BSN NCM WEEKEND [phone] PT-SNF.CSW FOLLOWING.  01/06/2013 LINDA MANNING BSN RN CCM [phone] CM SPOKE WITH PATIENT WHO IS ALONE IN ROOM. Pt giving inappropriate response or no responses to questions.  PT questioned as where she will be going upon discharge and who will be caregiver. She states she would be going to CM's home and was unable to tell CM who caregiver is. Unable to answer further questions regarding discharge plans. Reportrd pt's responses to care coordinator.   01/05/2013 Colleen Can BSN RN CCM [phone] Received call from [facility] rep and advised that patient is active with their services-[facility] and [facility] aid. CM will follow up for [facility] needs.

## 2013-01-09 DIAGNOSIS — I8290 Acute embolism and thrombosis of unspecified vein: Secondary | ICD-10-CM

## 2013-01-09 DIAGNOSIS — E1165 Type 2 diabetes mellitus with hyperglycemia: Secondary | ICD-10-CM

## 2013-01-09 DIAGNOSIS — IMO0002 Reserved for concepts with insufficient information to code with codable children: Secondary | ICD-10-CM

## 2013-01-09 DIAGNOSIS — Z5189 Encounter for other specified aftercare: Secondary | ICD-10-CM

## 2013-01-09 HISTORY — DX: Encounter for other specified aftercare: Z51.89

## 2013-01-09 HISTORY — DX: Type 2 diabetes mellitus with hyperglycemia: E11.65

## 2013-01-09 HISTORY — DX: Acute embolism and thrombosis of unspecified vein: I82.90

## 2013-01-09 HISTORY — DX: Reserved for concepts with insufficient information to code with codable children: IMO0002

## 2013-01-09 LAB — BASIC METABOLIC PANEL
BUN: 27 mg/dL — ABNORMAL HIGH (ref 6–23)
Chloride: 101 mEq/L (ref 96–112)
GFR calc Af Amer: 56 mL/min — ABNORMAL LOW (ref >90)
Glucose, Bld: 99 mg/dL (ref 70–99)
Potassium: 5.5 mEq/L — ABNORMAL HIGH (ref 3.5–5.1)

## 2013-01-09 NOTE — Progress Notes (Signed)
Clinical Social Work Department CLINICAL SOCIAL WORK PLACEMENT NOTE 01/09/2013  Patient:  [name] [name], [name] [name]  Account Number:  [id number] Admit date:  01/05/2013  Clinical Social Worker:  Doroteo Glassman  Date/time:  01/08/2013 03:26 PM  Clinical Social Work is seeking post-discharge placement for this patient at the following level of care:   SKILLED NURSING   (*CSW will update this form in Epic as items are completed)     Patient/family provided with [facility] Department of Clinical Social Work's list of facilities offering this level of care within the geographic area requested by the patient (or if unable, by the patient's family).    Patient/family informed of their freedom to choose among providers that offer the needed level of care, that participate in Medicare, Medicaid or managed care program needed by the patient, have an available bed and are willing to accept the patient.    Patient/family informed of [facility]' ownership interest in [facility], as well as of the fact that they are under no obligation to receive care at this facility.  PASARR submitted to EDS on 01/08/2013 PASARR number received from EDS on 01/08/2013  FL2 transmitted to all facilities in geographic area requested by pt/family on  01/09/2013 FL2 transmitted to all facilities within larger geographic area on   Patient informed that his/her managed care company has contracts with or will negotiate with  certain facilities, including the following:     Patient/family informed of bed offers received:  01/09/2013 Patient chooses bed at [facility] [place] Physician recommends and patient chooses bed at    Patient to be transferred to [facility] on  01/09/2013 Patient to be transferred to facility by [facility]  The following physician request were entered in Epic:   Additional Comments: Cori Razor LCSW [phone]

## 2013-01-09 NOTE — Discharge Summary (Signed)
Patient ID: [name] [name] MRN: [id number] DOB/AGE: March 26, 1946 67 y.o.  Admit date: 01/05/2013 Discharge date: 01/09/2013  Admission Diagnoses:  Principal Problem:   S/P transmetatarsal amputation of foot   Discharge Diagnoses:  Same  Past Medical History  Diagnosis Date  . 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 Diabetes mellitus type 2, uncontrolled, with complications     Very poorly controlled  . DVT, bilateral lower limbs 05/2012  . 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 GERD (gastroesophageal reflux disease)   . History of blood transfusion   . Coronary artery disease     Severe Multivessel Disase; 3 V Disease wtih LAD distribution Viability &  inferior scar.    Surgeries: Procedure(s): I & D Right foot, Revision Transmet Amputation on 01/05/2013   Consultants:    Discharged Condition: Improved  Hospital Course: CHRISA HASSAN is an 67 y.o. female who was admitted 01/05/2013 for operative treatment ofS/P transmetatarsal amputation of foot. Patient has severe unremitting pain that affects sleep, daily activities, and work/hobbies. After pre-op clearance the patient was taken to the operating room on 01/05/2013 and underwent  Procedure(s): I & D Right foot, Revision Transmet Amputation.    Patient was given perioperative antibiotics: Anti-infectives   Start     Dose/Rate Route Frequency Ordered Stop   01/08/13 2200  vancomycin (VANCOCIN) IVPB 750 mg/150 ml premix     750 mg 150 mL/hr over 60 Minutes Intravenous Every 12 hours 01/08/13 1011     01/08/13 1100  vancomycin (VANCOCIN) 1,750 mg in sodium chloride 0.9 % 500 mL IVPB     1,750 mg 250 mL/hr over 120 Minutes Intravenous  Once 01/08/13 1011 01/08/13 1325   01/05/13 2000  clindamycin (CLEOCIN) IVPB 600 mg     600 mg 100 mL/hr over 30 Minutes Intravenous Every 6 hours 01/05/13 1421 01/06/13 0853   01/05/13 0921  vancomycin  (VANCOCIN) IVPB 1000 mg/200 mL premix     1,000 mg 200 mL/hr over 60 Minutes Intravenous On call to O.R. 01/05/13 4098 01/05/13 1138       Patient was given sequential compression devices, early ambulation, and chemoprophylaxis to prevent DVT.  Patient benefited maximally from hospital stay and there were no complications.    Recent vital signs: Patient Vitals for the past 24 hrs:  BP Temp Temp src Pulse Resp SpO2  01/09/13 0438 117/77 mmHg 98 F (36.7 C) Oral 85 20 99 %  01/08/13 2124 107/73 mmHg 98.4 F (36.9 C) Oral 80 20 100 %  01/08/13 1353 92/65 mmHg 96.4 F (35.8 C) Axillary 73 20 98 %     Recent laboratory studies:  Recent Labs  01/07/13 1930 01/08/13 0510 01/09/13 0438  WBC 14.2*  --   --   HGB 10.3*  --   --   HCT 34.3*  --   --   PLT 252  --   --   NA 131* 129* 131*  K 5.4* 5.1 5.5*  CL 102 101 101  CO2 25 23 26   BUN 23 25* 27*  CREATININE 1.16* 1.07 1.15*  GLUCOSE 125* 101* 99  CALCIUM 12.5* 12.8* 12.9*     Discharge Medications:     Medication List    TAKE these medications       acetaminophen 500 MG tablet  Commonly known as:  TYLENOL  Take 500 mg by mouth every 6 (six) hours as needed for pain.  aspirin 81 MG EC tablet  Take 1 tablet (81 mg total) by mouth daily.     bismuth subsalicylate 262 MG/15ML suspension  Commonly known as:  PEPTO BISMOL  Take 30 mLs by mouth every 6 (six) hours as needed for indigestion (stomach).     docusate sodium 100 MG capsule  Commonly known as:  COLACE  Take 100 mg by mouth daily as needed for constipation.     isosorbide mononitrate 30 MG 24 hr tablet  Commonly known as:  IMDUR  Take 30 mg by mouth daily as needed (for weakness).     metFORMIN 500 MG tablet  Commonly known as:  GLUCOPHAGE  Take 500 mg by mouth 2 (two) times daily.     ranitidine 150 MG tablet  Commonly known as:  ZANTAC  Take 150 mg by mouth daily as needed for heartburn.        Diagnostic Studies: Dg Chest 2  View  01/05/2013   *RADIOLOGY REPORT*  Clinical Data: Preop right foot surgery  CHEST - 2 VIEW  Comparison: 10/13/2012  Findings: Bilateral effusions and bibasilar atelectasis have progressed.  This may be due to fluid overload.  There is mild vascular congestion.  IMPRESSION: Progressive volume loss and bilateral pleural effusions suggesting fluid overload.   Original Report Authenticated By: Janeece Riggers, M.D.    Disposition: to home      Discharge Orders   Future Orders Complete By Expires     Call MD / Call 911  As directed     Comments:      If you experience chest pain or shortness of breath, CALL 911 and be transported to the hospital emergency room.  If you develope a fever above 101 F, pus (white drainage) or increased drainage or redness at the wound, or calf pain, call your surgeon's office.    Constipation Prevention  As directed     Comments:      Drink plenty of fluids.  Prune juice may be helpful.  You may use a stool softener, such as Colace (over the counter) 100 mg twice a day.  Use MiraLax (over the counter) for constipation as needed.    Diet - low sodium heart healthy  As directed     Discharge patient  As directed     Increase activity slowly as tolerated  As directed        Follow-up Information   Follow up with Kathryne Hitch, MD In 2 weeks.   Contact information:   [address] [place] [address] [phone]        Signed: Kathryne Hitch 01/09/2013, 7:56 AM

## 2013-01-09 NOTE — Progress Notes (Signed)
Pt d/c to [facility] facility. Report given to nurse.

## 2013-01-09 NOTE — Progress Notes (Signed)
Subjective: 4 Days Post-Op Procedure(s) (LRB): I & D Right foot, Revision Transmet Amputation (Right) Patient reports pain as mild.  At baseline mental status.  Objective: Vital signs in last 24 hours: Temp:  [96.4 F (35.8 C)-98.4 F (36.9 C)] 98 F (36.7 C) (05/19 0438) Pulse Rate:  [73-85] 85 (05/19 0438) Resp:  [20] 20 (05/19 0438) BP: (92-117)/(65-77) 117/77 mmHg (05/19 0438) SpO2:  [98 %-100 %] 99 % (05/19 0438)  Intake/Output from previous day: 05/18 0701 - 05/19 0700 In: 930 [P.O.:780; IV Piggyback:150] Out: 250 [Urine:250] Intake/Output this shift:     Recent Labs  01/07/13 1930  HGB 10.3*    Recent Labs  01/07/13 1930  WBC 14.2*  RBC 4.23  HCT 34.3*  PLT 252    Recent Labs  01/08/13 0510 01/09/13 0438  NA 129* 131*  K 5.1 5.5*  CL 101 101  CO2 23 26  BUN 25* 27*  CREATININE 1.07 1.15*  GLUCOSE 101* 99  CALCIUM 12.8* 12.9*   No results found for this basename: LABPT, INR,  in the last 72 hours  Incision: dressing C/D/I No cellulitis present Compartment soft  Assessment/Plan: 4 Days Post-Op Procedure(s) (LRB): I & D Right foot, Revision Transmet Amputation (Right) Discharge to home today.  [name] [name] 01/09/2013, 7:53 AM

## 2013-01-11 ENCOUNTER — Encounter: Payer: Self-pay | Admitting: Internal Medicine

## 2013-01-11 ENCOUNTER — Non-Acute Institutional Stay (SKILLED_NURSING_FACILITY): Payer: Medicaid Other | Admitting: Internal Medicine

## 2013-01-11 DIAGNOSIS — I255 Ischemic cardiomyopathy: Secondary | ICD-10-CM

## 2013-01-11 DIAGNOSIS — E119 Type 2 diabetes mellitus without complications: Secondary | ICD-10-CM

## 2013-01-11 DIAGNOSIS — I96 Gangrene, not elsewhere classified: Secondary | ICD-10-CM

## 2013-01-11 DIAGNOSIS — I509 Heart failure, unspecified: Secondary | ICD-10-CM

## 2013-01-11 DIAGNOSIS — I739 Peripheral vascular disease, unspecified: Secondary | ICD-10-CM

## 2013-01-11 DIAGNOSIS — N183 Chronic kidney disease, stage 3 unspecified: Secondary | ICD-10-CM

## 2013-01-11 DIAGNOSIS — I5042 Chronic combined systolic (congestive) and diastolic (congestive) heart failure: Secondary | ICD-10-CM

## 2013-01-11 DIAGNOSIS — I2589 Other forms of chronic ischemic heart disease: Secondary | ICD-10-CM

## 2013-01-11 DIAGNOSIS — I743 Embolism and thrombosis of arteries of the lower extremities: Secondary | ICD-10-CM

## 2013-01-11 DIAGNOSIS — I2510 Atherosclerotic heart disease of native coronary artery without angina pectoris: Secondary | ICD-10-CM

## 2013-01-11 DIAGNOSIS — D638 Anemia in other chronic diseases classified elsewhere: Secondary | ICD-10-CM

## 2013-01-11 NOTE — Progress Notes (Signed)
Date: 01/11/2013  MRN:  [id number] Name:  [name] [name] Sex:  female Age:  67 y.o. DOB:[birth year]   PSC #:                       Facility/Room:  [facility] Level Of Care:  SNF Provider: Kermit Balo, DO, CMD  Emergency Contacts: Contact Information   Name Relation Home Work [place] Significant other [phone]  [phone]   Lamar Laundry [phone]        Code Status:  Full code  Allergies: Allergies  Allergen Reactions  . Penicillins Anaphylaxis  . Sulfa Antibiotics Anaphylaxis     Chief Complaint  Patient presents with  . Hospitalization Follow-up    new admission s/p hospitalization for amputation   HPI:  67 yo female with h/o vasculopathy (DMII, uncontrolled, complicated;  CAD with dilated ischemic cardiomyopathy and severe systolic heart failure, PVD with popliteal a occlusion right, b/l LE DVTs), suspected vascular dementia was admitted here for short term rehab s/p hospitalization 01/05/13 with ischemic right foot.  She underwent transmetatarsal amputation by Dr. Magnus Ivan.  When seen, she was very confused, not making sense.  She was unable to provide any meaningful history, talking about numerous unrelated things and obsessing over her diet.     Past Medical History  Diagnosis Date  . 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 Diabetes mellitus type 2, uncontrolled, with complications     Very poorly controlled  . DVT, bilateral lower limbs 05/2012  . 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 GERD (gastroesophageal reflux disease)   . History of blood transfusion   . Coronary artery disease     Severe Multivessel Disase; 3 V Disease wtih LAD distribution Viability &  inferior scar.    Past Surgical History  Procedure Laterality Date  . Vaginal hysterectomy  ~ 1980  . Cesarean section  1979  . Lumbar disc surgery  1980's  .  Facial reconstruction surgery    . Cardiac catheterization    . Amputation Right 10/16/2012    Procedure: AMPUTATION FOOT;  Surgeon: Kathryne Hitch, MD;  Location: [facility];  Service: Orthopedics;  Laterality: Right;  transmetatarsal amp  . I&d extremity Right 01/05/2013    Procedure: I & D Right foot, Revision Transmet Amputation;  Surgeon: Kathryne Hitch, MD;  Location: [facility];  Service: Orthopedics;  Laterality: Right;  Procedures: 10/13/12:  Right foot xray:  Periosteal reaction along the second metatarsal and  deformity/defect at the tip of the second distal phalanx.  Osteomyelitis not excluded. MRI has increased sensitivity   01/05/13:  CXR:  Progressive volume loss and bilateral pleural effusions suggesting  fluid overload.  Consultants: Dr. Doneen Poisson, orthopedics  Current Outpatient Prescriptions  Medication Sig Dispense Refill  . acetaminophen (TYLENOL) 500 MG tablet Take 500 mg by mouth every 6 (six) hours as needed for pain.      Marland Kitchen aspirin EC 81 MG EC tablet Take 1 tablet (81 mg total) by mouth daily.      Marland Kitchen bismuth subsalicylate (PEPTO BISMOL) 262 MG/15ML suspension Take 30 mLs by mouth every 6 (six) hours as needed for indigestion (stomach).      . docusate sodium (COLACE) 100 MG capsule Take 100 mg by mouth daily as needed for constipation.      . isosorbide mononitrate (IMDUR) 30 MG 24 hr tablet Take  30 mg by mouth daily as needed (for weakness).      . metFORMIN (GLUCOPHAGE) 500 MG tablet Take 500 mg by mouth 2 (two) times daily.       . ranitidine (ZANTAC) 150 MG tablet Take 150 mg by mouth daily as needed for heartburn.       No current facility-administered medications for this visit.    There is no immunization history on file for this patient.   History  Substance Use Topics  . Smoking status: Former Smoker -- 0.50 packs/day for 20 years    Types: Cigarettes  . Smokeless tobacco: Never Used     Comment: 07/06/12 "stopped smoking  cigarettes years ago"  . Alcohol Use: No    Family History  Problem Relation Age of Onset  . Cancer Mother   . CAD Mother   . Cancer Father   . CAD Father    Review of Systems  Unable to perform ROS: mental status change    Vital signs: There were no vitals taken for this visit. Physical Exam  Constitutional: No distress.  HENT:  Head: Normocephalic and atraumatic.  Right Ear: External ear normal.  Left Ear: External ear normal.  Nose: Nose normal.  Mouth/Throat: Oropharynx is clear and moist. No oropharyngeal exudate.  Eyes: EOM are normal. Pupils are equal, round, and reactive to light.  Neck: No JVD present.  Cardiovascular: Normal rate and regular rhythm.   Pulmonary/Chest: Effort normal. No respiratory distress. She has rales.  Abdominal: Soft. Bowel sounds are normal. She exhibits no distension. There is no tenderness.  Musculoskeletal: She exhibits no edema and no tenderness.  Neurological: She is alert.  Very confused, tangential, not focused, inattentive  Skin:  Transmetatarsal wound dressing intact, some serosanguinous drainage present    Lab Results  Component Value Date   HGBA1C 8.8* 10/17/2012   Lab Results  Component Value Date   WBC 14.2* 01/07/2013   HGB 10.3* 01/07/2013   HCT 34.3* 01/07/2013   MCV 81.1 01/07/2013   PLT 252 01/07/2013     Chemistry      Component Value Date/Time   NA 131* 01/09/2013 0438   K 5.5* 01/09/2013 0438   CL 101 01/09/2013 0438   CO2 26 01/09/2013 0438   BUN 27* 01/09/2013 0438   CREATININE 1.15* 01/09/2013 0438      Component Value Date/Time   CALCIUM 12.9* 01/09/2013 0438   CALCIUM 11.1* 07/03/2012 0513   ALKPHOS 207* 07/24/2012 0944   AST 17 07/24/2012 0944   ALT 18 07/24/2012 0944   BILITOT 0.3 07/24/2012 0944     No results found for this basename: MICROALBUR, [id number]   Plan: 1. Chronic kidney disease, stage 3 -f/u bmp -avoid nsaids and carefully select diabetes meds -should have urine microalbumin checked  2.  DM, Type 2 IDDM -last hba1c over 8 --is on metformin--f/u bmp to ensure this is safe for her at her stage of ckd -monitor cbgs ac and hs--may need insulin depending on her po intake in order to do our best to promote her wound healing  3. Gangrene of foot, Rt foot transmetatarsal amputation 10/16/12 --s/p amputation--had prior right popliteal thrombosis, some bony changes on xray, had refused amputation until this month --appears this will not heal well --will work on bp, lipid, glucose control to try to help with healing (control vascular risks) --being followed by wound care Dr. Lorenz Coaster weekly and our treatment nurse  4. Ischemic cardiomyopathy, EF 25% Oct 2013- improved to 30%  by 07/20/12 echo --apparently had lifevest at one point due to severe heart failure --had some acute on chronic systolic failure s/p surgery and hydration --monitor for this and low sodium diet  5. 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 ischemic Rt foot --h/o in October, now s/p transmetatarsal amputation  6. PVD (peripheral vascular disease) --I don't see where she's had ABIs performed previously and these may be needed for further assessment  7. Chronic combined systolic and diastolic CHF (congestive heart failure) --severe, high risk for flare --monitor volume status carefully, has poor prognosis  8. CAD, 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 She needs CABG --too ill for CABG at this point --continue managing vascular risk factors --only on baby asa and imdur at this point--has had affordability issues that interfered with adherence and also seems to have vascular dementia  9. Anemia of chronic disease --f/u cbc  CBC, BMP next draw

## 2013-01-17 ENCOUNTER — Encounter: Payer: Self-pay | Admitting: Adult Health

## 2013-01-17 ENCOUNTER — Non-Acute Institutional Stay (SKILLED_NURSING_FACILITY): Payer: Medicaid Other | Admitting: Adult Health

## 2013-01-17 DIAGNOSIS — I255 Ischemic cardiomyopathy: Secondary | ICD-10-CM

## 2013-01-17 DIAGNOSIS — I2589 Other forms of chronic ischemic heart disease: Secondary | ICD-10-CM

## 2013-01-17 MED ORDER — FUROSEMIDE 40 MG PO TABS: 40.0000 mg | ORAL_TABLET | Freq: Every day | ORAL | Status: DC

## 2013-01-17 NOTE — Assessment & Plan Note (Signed)
She is presently stable with  Bun of 21 and creatinine of 0.93 will monitor her status

## 2013-01-17 NOTE — Progress Notes (Signed)
Patient ID: [name] [name], female   DOB: 1946-05-12, 67 y.o.   MRN: [id number]  FACILITY: [facility]   Allergies  Allergen Reactions  . Penicillins Anaphylaxis  . Sulfa Antibiotics Anaphylaxis     Chief Complaint  Patient presents with  . Acute Visit    edema    HPI: Staff reports that she is having increased edema present; they report that she has generalized edema present; there are concerns that she is confused her family is concerned as she has been confused prior to her hospitalization. She alert and orientated however; she does have word confusion present; she is presently denying any chest pain or shortness of breath present.   Past Medical History  Diagnosis Date  . 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 Diabetes mellitus type 2, uncontrolled, with complications     Very poorly controlled  . DVT, bilateral lower limbs 05/2012  . 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 GERD (gastroesophageal reflux disease)   . History of blood transfusion   . Coronary artery disease     Severe Multivessel Disase; 3 V Disease wtih LAD distribution Viability &  inferior scar.    Past Surgical History  Procedure Laterality Date  . Vaginal hysterectomy  ~ 1980  . Cesarean section  1979  . Lumbar disc surgery  1980's  . Facial reconstruction surgery    . Cardiac catheterization    . Amputation Right 10/16/2012    Procedure: AMPUTATION FOOT;  Surgeon: Kathryne Hitch, MD;  Location: [facility];  Service: Orthopedics;  Laterality: Right;  transmetatarsal amp  . I&d extremity Right 01/05/2013    Procedure: I & D Right foot, Revision Transmet Amputation;  Surgeon: Kathryne Hitch, MD;  Location: [facility];  Service: Orthopedics;  Laterality: Right;    VITAL SIGNS BP 105/64  Pulse 68  Ht 5\' 3"  (1.6 m)  Wt 195 lb (88.451 kg)  BMI 34.55 kg/m2   Patient's Medications  New Prescriptions   No  medications on file  Previous Medications   ACETAMINOPHEN (TYLENOL) 500 MG TABLET    Take 500 mg by mouth every 6 (six) hours as needed for pain.   ASPIRIN EC 81 MG EC TABLET    Take 1 tablet (81 mg total) by mouth daily.   BISMUTH SUBSALICYLATE (PEPTO BISMOL) 262 MG/15ML SUSPENSION    Take 30 mLs by mouth every 6 (six) hours as needed for indigestion (stomach).   DOCUSATE SODIUM (COLACE) 100 MG CAPSULE    Take 100 mg by mouth daily as needed for constipation.   ISOSORBIDE MONONITRATE (IMDUR) 30 MG 24 HR TABLET    Take 30 mg by mouth daily as needed (for weakness).   METFORMIN (GLUCOPHAGE) 500 MG TABLET    Take 500 mg by mouth 2 (two) times daily.    RANITIDINE (ZANTAC) 150 MG TABLET    Take 150 mg by mouth daily as needed for heartburn.  Modified Medications   No medications on file  Discontinued Medications   No medications on file    SIGNIFICANT DIAGNOSTIC EXAMS   01-05-13: chest x-ray: Progressive volume loss and bilateral pleural effusions suggesting  fluid overload.     Component Value Date/Time   ALBUMIN 2.8* 07/24/2012 0944   AST 17 07/24/2012 0944   ALT 18 07/24/2012 0944   ALKPHOS 207* 07/24/2012 0944   BILITOT 0.3 07/24/2012 0944       Component Value  Date/Time   BUN 27* 01/09/2013 0438   GLUCOSE 99 01/09/2013 0438   CREATININE 1.15* 01/09/2013 0438   K 5.5* 01/09/2013 0438   NA 131* 01/09/2013 0438   TSH 3.371 07/12/2012 0139       Component Value Date/Time   WBC 14.2* 01/07/2013 1930   RBC 4.23 01/07/2013 1930   HGB 10.3* 01/07/2013 1930   HCT 34.3* 01/07/2013 1930   PLT 252 01/07/2013 1930   MCV 81.1 01/07/2013 1930   01-09-13: ca++ 12.9  01-17-13: wbc 7.4; hgb 11.1; hct 35.1; mcv 77.8 ;plt 326; glucose 101; bun 21; creat 0.93; k+ 4.6 Na++ 138; alk phos 159; albumin 2.5; ca++ 11.6   .ROS  Physical Exam  Constitutional: She appears well-developed and well-nourished.  Neck: Neck supple. No thyromegaly present.  Cardiovascular: Normal rate and regular rhythm.   +1  pp on left; diminished on right  Respiratory: Effort normal and breath sounds normal.  GI: Soft. Bowel sounds are normal. She exhibits no distension. There is no tenderness.  Musculoskeletal:  Is out of bed to wheelchair; is able to move all extremities; has partial foot amputation on right  Has 2+ upper extremity edema and 1+ lower extremity edema  Neurological: She is alert.  She know time; date; place; does have word confusion present  Skin: Skin is warm and dry.  Right foot dressing has old bloody drainage present.   Psychiatric: She has a normal mood and affect.      ASSESSMENT/ PLAN:  Ischemic cardiomyopathy, EF 25% Oct 2013- improved to 30% by 07/20/12 echo She has worsening peripheral edema present; will place her on daily weights and to call for daily gain of >= 3 lb and weekly gain of >= 5 pounds. Will begin her on lasix 40 mg daily will check bmp/bnp on 01-20-13. Will continue to monitor her status.   Chronic kidney disease, stage 3 She is presently stable with  Bun of 21 and creatinine of 0.93 will monitor her status     Time spent with patient 45 minutes

## 2013-01-17 NOTE — Assessment & Plan Note (Signed)
She has worsening peripheral edema present; will place her on daily weights and to call for daily gain of >= 3 lb and weekly gain of >= 5 pounds. Will begin her on lasix 40 mg daily will check bmp/bnp on 01-20-13. Will continue to monitor her status.

## 2013-01-24 ENCOUNTER — Non-Acute Institutional Stay (SKILLED_NURSING_FACILITY): Payer: Medicaid Other | Admitting: Adult Health

## 2013-01-24 DIAGNOSIS — L03115 Cellulitis of right lower limb: Secondary | ICD-10-CM

## 2013-01-24 DIAGNOSIS — L02619 Cutaneous abscess of unspecified foot: Secondary | ICD-10-CM

## 2013-01-30 ENCOUNTER — Encounter: Payer: Self-pay | Admitting: Vascular Surgery

## 2013-01-30 ENCOUNTER — Other Ambulatory Visit: Payer: Self-pay

## 2013-01-30 DIAGNOSIS — I739 Peripheral vascular disease, unspecified: Secondary | ICD-10-CM

## 2013-02-27 ENCOUNTER — Encounter: Payer: Self-pay | Admitting: Vascular Surgery

## 2013-02-28 ENCOUNTER — Encounter: Payer: Self-pay | Admitting: Vascular Surgery

## 2013-02-28 ENCOUNTER — Ambulatory Visit (INDEPENDENT_AMBULATORY_CARE_PROVIDER_SITE_OTHER): Payer: Medicare Other | Admitting: Vascular Surgery

## 2013-02-28 VITALS — BP 112/73 | HR 83 | Resp 16 | Ht 66.0 in | Wt 172.0 lb

## 2013-02-28 DIAGNOSIS — I739 Peripheral vascular disease, unspecified: Secondary | ICD-10-CM

## 2013-02-28 DIAGNOSIS — Z5189 Encounter for other specified aftercare: Secondary | ICD-10-CM

## 2013-02-28 NOTE — Progress Notes (Signed)
Subjective:     Patient ID: [name] [name], female   DOB: [birth year], 67 y.o.   MRN: [id number]  HPI this 67 year old female is seen for second opinion. The patient was seen by me in November 2013 at which time she had bilateral lower extremity DVT. She also had a right popliteal occlusion but did not have limb threatening ischemia at that time. She has a bad ejection fraction-25%. Since that time she underwent a right transmetatarsal and dictation by Dr. Rayburn Ma. She then decided later that she did not want to be treated any longer by Dr. Rayburn Ma. Indication has not completely healed. She is living at [facility] and is being treated with dressing changes. She was apparently on Coumadin for some unknown period of time but is currently not taking Coumadin. He is asking what her next step should be.  Past Medical History  Diagnosis Date  . 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 DVT, bilateral lower limbs 05/2012  . 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 GERD (gastroesophageal reflux disease)   . History of blood transfusion   . Coronary artery disease     Severe Multivessel Disase; 3 V Disease wtih LAD distribution Viability &  inferior scar.  . Numbness in feet 06/29/12    Right worse than left  . DVT (deep venous thrombosis) 06/29/12  . CHF (congestive heart failure)   . Venous embolism and thrombosis 01/09/13  . Diabetes mellitus type 2, uncontrolled, with complications 01/09/13    Very poorly controlled  . Coronary atherosclerosis of unspecified type of vessel, native or graft   . Blood transfusion, without reported diagnosis 01/09/13  . Gangrene of foot 10/16/12    Right     History  Substance Use Topics  . Smoking status: Former Smoker -- 0.50 packs/day for 20 years    Types: Cigarettes  . Smokeless tobacco: Never Used     Comment: 07/06/12 "stopped smoking cigarettes years ago"  . Alcohol  Use: No    Family History  Problem Relation Age of Onset  . Cancer Mother   . CAD Mother   . Heart disease Mother     Heart Disease before age 32  . Hyperlipidemia Mother   . Hypertension Mother   . Heart attack Mother   . Cancer Father   . CAD Father     Allergies  Allergen Reactions  . Penicillins Anaphylaxis  . Sulfa Antibiotics Anaphylaxis    Current outpatient prescriptions:acetaminophen (TYLENOL) 500 MG tablet, Take 500 mg by mouth every 6 (six) hours as needed for pain., Disp: , Rfl: ;  aspirin EC 81 MG EC tablet, Take 1 tablet (81 mg total) by mouth daily., Disp: , Rfl: ;  bismuth subsalicylate (PEPTO BISMOL) 262 MG/15ML suspension, Take 30 mLs by mouth every 6 (six) hours as needed for indigestion (stomach)., Disp: , Rfl:  docusate sodium (COLACE) 100 MG capsule, Take 100 mg by mouth daily as needed for constipation., Disp: , Rfl: ;  furosemide (LASIX) 40 MG tablet, Take 1 tablet (40 mg total) by mouth daily., Disp: 30 tablet, Rfl: 3;  metFORMIN (GLUCOPHAGE) 500 MG tablet, Take 500 mg by mouth 2 (two) times daily. , Disp: , Rfl: ;  Multiple Vitamin (MULTIVITAMIN) tablet, Take 1 tablet by mouth daily., Disp: , Rfl:  ranitidine (ZANTAC) 150 MG tablet, Take 150 mg by mouth daily as needed for heartburn., Disp: , Rfl: ;  saccharomyces boulardii (FLORASTOR) 250 MG capsule, Take 250 mg by mouth 2 (two) times daily., Disp: , Rfl: ;  isosorbide mononitrate (IMDUR) 30 MG 24 hr tablet, Take 30 mg by mouth daily as needed (for weakness)., Disp: , Rfl:   BP 112/73  Pulse 83  Resp 16  Ht 5\' 6"  (1.676 m)  Wt 172 lb (78.019 kg)  BMI 27.77 kg/m2  SpO2 96%  Body mass index is 27.77 kg/(m^2).           Review of Systems     Objective:   Physical Exam general chronically ill-appearing patient in no apparent distress in a wheelchair accompanied by her daughter Right lower extremity with 2+ femoral pulse palpable. She has a right transmetatarsal foot amputation with an extensive  area of nonhealing measuring about 5 x 6 cm with no exposed bone. Tissue does not appear healthy.-No palpable popliteal or distal pulses.    Assessment:     I reviewed the medical records in my previous consultation note and had a long discussion with the patient and her daughter. This patient is not an operative candidate for distal bypass. I do not think that the foot will heal even with the distal bypass. Our recommendation is a right below knee amputation. Also discussed the fact that there is a significant chance that DKA will not heal and will need to be revised AKA of the patient would like to preserve maximum limb attempted a BKA  might be feasible.    Plan:     No urgency in proceeding with amputation at this time because there is no active infection although I doubt this will ever heal. I discussed obtaining an angiogram to see if any peripheral intervention could be performed of the popliteal artery level but I do not think this will lead to wound healing and I did discourage this Best plan is for patient to come to grips with need for leg amputation and perform that in the near future when she is ready to proceed

## 2013-03-15 ENCOUNTER — Non-Acute Institutional Stay (SKILLED_NURSING_FACILITY): Payer: Medicare Other | Admitting: Internal Medicine

## 2013-03-15 DIAGNOSIS — I739 Peripheral vascular disease, unspecified: Secondary | ICD-10-CM

## 2013-03-15 DIAGNOSIS — L02619 Cutaneous abscess of unspecified foot: Secondary | ICD-10-CM

## 2013-03-15 DIAGNOSIS — L03119 Cellulitis of unspecified part of limb: Secondary | ICD-10-CM

## 2013-03-15 DIAGNOSIS — E119 Type 2 diabetes mellitus without complications: Secondary | ICD-10-CM

## 2013-03-15 DIAGNOSIS — L03115 Cellulitis of right lower limb: Secondary | ICD-10-CM

## 2013-03-15 NOTE — Progress Notes (Signed)
Patient ID: [name] [name], female   DOB: Jul 21, 1946, 67 y.o.   MRN: [id number] Location:  Location:  [facility] Provider:  Gwenith Spitz. Renato Gails, D.O., C.M.D.  Code Status:  full code  Chief Complaint: med mgt chronic diseases HPI:  67 yo female seen for med mgt of chronic diseases.  She is here for short term rehab s/p hospitalization for right foot transmetatarsal amputation.  This has been complicated by cellulitis.  She is completing abx for this.  There has been improvement in erythema, warmth of the area.  She was seen by vascular and there are concerns about whether further surgery would heal properly so it was not recommended.  Review of Systems:  Review of Systems  Constitutional: Negative for fever and chills.  Eyes: Negative for blurred vision.  Respiratory: Negative for shortness of breath.   Cardiovascular: Negative for chest pain.  Gastrointestinal: Negative for abdominal pain.  Genitourinary: Negative for dysuria.  Skin: Positive for rash.       Erythema, warmth still present, but improved  Neurological: Negative for dizziness and headaches.  Psychiatric/Behavioral: Negative for memory loss.     Medications: Patient's Medications  New Prescriptions   No medications on file  Previous Medications   ACETAMINOPHEN (TYLENOL) 500 MG TABLET    Take 500 mg by mouth every 6 (six) hours as needed for pain.   ASPIRIN EC 81 MG EC TABLET    Take 1 tablet (81 mg total) by mouth daily.   BISMUTH SUBSALICYLATE (PEPTO BISMOL) 262 MG/15ML SUSPENSION    Take 30 mLs by mouth every 6 (six) hours as needed for indigestion (stomach).   DOCUSATE SODIUM (COLACE) 100 MG CAPSULE    Take 100 mg by mouth daily as needed for constipation.   FUROSEMIDE (LASIX) 40 MG TABLET    Take 1 tablet (40 mg total) by mouth daily.   ISOSORBIDE MONONITRATE (IMDUR) 30 MG 24 HR TABLET    Take 30 mg by mouth daily as needed (for weakness).   METFORMIN (GLUCOPHAGE) 500 MG TABLET    Take 500 mg by  mouth 2 (two) times daily.    MULTIPLE VITAMIN (MULTIVITAMIN) TABLET    Take 1 tablet by mouth daily.   RANITIDINE (ZANTAC) 150 MG TABLET    Take 150 mg by mouth daily as needed for heartburn.   SACCHAROMYCES BOULARDII (FLORASTOR) 250 MG CAPSULE    Take 250 mg by mouth 2 (two) times daily.  Modified Medications   No medications on file  Discontinued Medications   No medications on file    Physical Exam: There were no vitals filed for this visit. Physical Exam  Constitutional: No distress.  Cardiovascular: Normal rate, regular rhythm and normal heart sounds.   Pulmonary/Chest: Effort normal and breath sounds normal. No respiratory distress.  Abdominal: Soft. Bowel sounds are normal. She exhibits no distension. There is no tenderness.  Neurological: She is alert.  Skin:  Mild erythema, warmth of right foot     Labs reviewed: Facility labs:   01/20/13:  Na 138, K 4.4, BUN 24, cr 1.01, bnp 2656.6 01/26/13:  hba1c 6.6, prealbumin 8.8 02/09/13 wound cx:  No wbc, no sq epi, abundant gr + rods and gr - coccobacilli, multiple bacterial morphotypes so recollect 02/22/13:  Wound cx:  Abundant enterobacter cloacae, moderate wbc (pmn and mononuclear), no sq epi  Basic Metabolic Panel:  Recent Labs  40/98/11 0448 06/30/12 0500 07/01/12 0435 07/03/12 0513  01/07/13 1930 01/08/13 0510 01/09/13 0438  NA  135 136 138 136  < > 131* 129* 131*  K 3.9 3.8 3.9 4.2  < > 5.4* 5.1 5.5*  CL 96 93* 96 96  < > 102 101 101  CO2 33* 34* 34* 32  < > 25 23 26   GLUCOSE 129* 175* 218* 269*  < > 125* 101* 99  BUN 30* 27* 26* 24*  < > 23 25* 27*  CREATININE 1.51* 1.38* 1.34* 1.19*  < > 1.16* 1.07 1.15*  CALCIUM 11.8* 12.1* 11.9* 11.4*  11.1*  < > 12.5* 12.8* 12.9*  MG  --  1.6  --   --   --   --   --   --   PHOS  --   --   --  2.7  --   --   --   --   < > = values in this interval not displayed.  Liver Function Tests:  Recent Labs  07/12/12 0139 07/24/12 0944  AST 14 17  ALT 16 18  ALKPHOS 199*  207*  BILITOT 0.4 0.3  PROT 6.9 7.2  ALBUMIN 2.6* 2.8*    CBC:  Recent Labs  07/11/12 1943 07/12/12 0139  10/13/12 0150  10/20/12 0407 01/05/13 0930 01/07/13 1930  WBC 8.9 8.1  < > 6.8  < > 10.8* 6.9 14.2*  NEUTROABS 6.1 5.8  --  4.5  --   --   --   --   HGB 9.6* 8.9*  < > 9.7*  < > 9.1* 11.3* 10.3*  HCT 29.0* 28.1*  < > 31.7*  < > 29.1* 35.3* 34.3*  MCV 80.1 79.2  < > 81.7  < > 80.4 79.5 81.1  PLT 386 364  < > 267  < > 241 238 252  < > = values in this interval not displayed.   Assessment/Plan 1. Cellulitis of right foot Wound infection:  Treated with cipro and florastor 02/27/13 x 14 days Improved 2. PVD (peripheral vascular disease) Not a good candidate for revascularization due to poor circulation and unlikely to heal 3. DM (diabetes mellitus) Under fair control here No changes needed  Goals of care: full code Labs/tests ordered:  Cbc, bmp

## 2013-04-17 ENCOUNTER — Non-Acute Institutional Stay (SKILLED_NURSING_FACILITY): Payer: Medicaid Other | Admitting: Internal Medicine

## 2013-04-17 ENCOUNTER — Encounter: Payer: Self-pay | Admitting: Internal Medicine

## 2013-04-17 DIAGNOSIS — S98919A Complete traumatic amputation of unspecified foot, level unspecified, initial encounter: Secondary | ICD-10-CM

## 2013-04-17 DIAGNOSIS — Z89431 Acquired absence of right foot: Secondary | ICD-10-CM

## 2013-04-17 DIAGNOSIS — I739 Peripheral vascular disease, unspecified: Secondary | ICD-10-CM

## 2013-04-17 DIAGNOSIS — I2589 Other forms of chronic ischemic heart disease: Secondary | ICD-10-CM

## 2013-04-17 DIAGNOSIS — I255 Ischemic cardiomyopathy: Secondary | ICD-10-CM

## 2013-04-17 DIAGNOSIS — N183 Chronic kidney disease, stage 3 unspecified: Secondary | ICD-10-CM

## 2013-04-17 DIAGNOSIS — D638 Anemia in other chronic diseases classified elsewhere: Secondary | ICD-10-CM

## 2013-04-17 DIAGNOSIS — E119 Type 2 diabetes mellitus without complications: Secondary | ICD-10-CM

## 2013-04-17 NOTE — Progress Notes (Signed)
MRN: [id number] Name: [name] [name]  Sex: female Age: 67 y.o. DOB: [birth year]  PSC #: Ronni Rumble Facility/Room: 128B Level Of Care: SNF Provider: Merrilee Seashore D Emergency Contacts: Extended Emergency Contact Information Primary Emergency Contact: Twotrees,Ricky Address: [address]          [place] [address] [place] Home Phone: [phone] Mobile Phone: [phone] Relation: Significant other Secondary Emergency Contact: Nori Riis [place] Home Phone: [phone] Relation: Daughter  Code Status: FULL  Allergies: Penicillins and Sulfa antibiotics  Chief Complaint  Patient presents with  . nursing home discharge    HPI: Patient is 67 y.o. female who is being discharged to home tomorrow.  Past Medical History  Diagnosis Date  . 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 DVT, bilateral lower limbs 05/2012  . 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 GERD (gastroesophageal reflux disease)   . History of blood transfusion   . Coronary artery disease     Severe Multivessel Disase; 3 V Disease wtih LAD distribution Viability &  inferior scar.  . Numbness in feet 06/29/12    Right worse than left  . DVT (deep venous thrombosis) 06/29/12  . CHF (congestive heart failure)   . Venous embolism and thrombosis 01/09/13  . Diabetes mellitus type 2, uncontrolled, with complications 01/09/13    Very poorly controlled  . Coronary atherosclerosis of unspecified type of vessel, native or graft   . Blood transfusion, without reported diagnosis 01/09/13  . Gangrene of foot 10/16/12    Right     Past Surgical History  Procedure Laterality Date  . Vaginal hysterectomy  ~ 1980  . Cesarean section  1979  . Lumbar disc surgery  1980's  . Facial reconstruction surgery    . Cardiac catheterization    . Amputation Right 10/16/2012    Procedure: AMPUTATION  FOOT;  Surgeon: Kathryne Hitch, MD;  Location: [facility];  Service: Orthopedics;  Laterality: Right;  transmetatarsal amp  . I&d extremity Right 01/05/2013    Procedure: I & D Right foot, Revision Transmet Amputation;  Surgeon: Kathryne Hitch, MD;  Location: [facility];  Service: Orthopedics;  Laterality: Right;  . Spine surgery  1980's    Lumbar disc      Medication List       This list is accurate as of: 04/17/13  2:11 PM.  Always use your most recent med list.               aspirin 81 MG EC tablet  Take 1 tablet (81 mg total) by mouth daily.     furosemide 40 MG tablet  Commonly known as:  LASIX  Take 40 mg by mouth 2 (two) times daily.     isosorbide mononitrate 30 MG 24 hr tablet  Commonly known as:  IMDUR  Take 30 mg by mouth daily as needed (for weakness).     metFORMIN 500 MG tablet  Commonly known as:  GLUCOPHAGE  Take 500 mg by mouth 2 (two) times daily.     multivitamin with minerals tablet  Take 1 tablet by mouth daily.     vitamin C 500 MG tablet  Commonly known as:  ASCORBIC ACID  Take 500 mg by mouth every 12 (twelve) hours.        Meds ordered this encounter  Medications  . Multiple Vitamins-Minerals (MULTIVITAMIN WITH MINERALS) tablet  Sig: Take 1 tablet by mouth daily.  . vitamin C (ASCORBIC ACID) 500 MG tablet    Sig: Take 500 mg by mouth every 12 (twelve) hours.  . furosemide (LASIX) 40 MG tablet    Sig: Take 40 mg by mouth 2 (two) times daily.     There is no immunization history on file for this patient.  History  Substance Use Topics  . Smoking status: Former Smoker -- 0.50 packs/day for 20 years    Types: Cigarettes  . Smokeless tobacco: Never Used     Comment: 07/06/12 "stopped smoking cigarettes years ago"  . Alcohol Use: No    Family history is noncontributory    Review of Systems  DATA OBTAINED from pt; she has no c/o ;she is ready to go home    Filed Vitals:   04/17/13 1341  BP: 135/65  Pulse: 70  Temp:  97.9 F (36.6 C)  Resp: 22    Physical Exam  GENERAL APPEARANCE: Alert, conversant. Appropriately groomed. No acute distress.  SKIN: No diaphoresis rash,  HEAD: Normocephalic, atraumatic  EYES: Conjunctiva/lids clear. Pupils round, reactive. EOMs intact.  EARS: External exam WNL, canals clear. Hearing grossly normal.  NOSE: No deformity or discharge.  MOUTH/THROAT: Lips w/o lesions. RESPIRATORY: Breathing is even, unlabored. Lung sounds are clear   CARDIOVASCULAR: Heart RRR no murmurs, rubs or gallops. No peripheral edema. GASTROINTESTINAL: Abdomen is soft, non-tender, not distended w/ normal bowel sounds. MUSCULOSKELETAL:R foot already in wrap-pt says it is doing really well NEUROLOGIC: Oriented X3. Cranial nerves 2-12 grossly intact. Moves all extremities no tremor. PSYCHIATRIC: Mood and affect appropriate to situation, no behavioral issues  Patient Active Problem List   Diagnosis Date Noted  . Visit for wound check 02/28/2013  . Peripheral vascular disease, unspecified 02/28/2013  . S/P transmetatarsal amputation of foot 01/05/2013  . Hypoxemia 10/19/2012  . Hypokalemia 10/19/2012  . Medical non-compliance secondary to finances 10/18/2012  . Chronic combined systolic and diastolic CHF (congestive heart failure) 10/17/2012  . Chronic kidney disease, stage 3 10/17/2012  . Gangrene of foot, Rt foot transmetatarsal amputation 10/16/12 10/13/2012  . Hyperkalemia, not on ACE or ARB.  CK and Lactic acid WNL 07/18/2012  . Anemia of chronic disease 07/12/2012  . CAD, 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 She needs CABG 07/06/2012  . 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 ischemic Rt foot 06/30/2012  . DM, Type 2 IDDM 06/29/2012  . PVD (peripheral vascular disease) 06/29/2012  . Ischemic cardiomyopathy, EF 25% Oct 2013- improved to 30% by 07/20/12 echo 06/29/2012  . LBBB (left bundle branch block) 06/21/2012    Functional assessment:   CBC-   WBC - 7.5  10.7/33.3PLT-  346 BMP 134, 4.5, 25, 107, 23/0.90, ca-10.4 HbA1c  6.6  On 01/26/2013 Wound cx 02/27/2013-   E. Cloacae  sens to cipro - pt txed 14 days Assessment and Plan  Very nice lady with multiple medical problems who is being d/c to home with home health care, of course, multiple DME , and wound care nurse and f/u with PCP.  Margit Hanks, MD

## 2013-04-18 DIAGNOSIS — L97409 Non-pressure chronic ulcer of unspecified heel and midfoot with unspecified severity: Secondary | ICD-10-CM

## 2013-04-18 DIAGNOSIS — T8189XA Other complications of procedures, not elsewhere classified, initial encounter: Secondary | ICD-10-CM

## 2013-04-18 DIAGNOSIS — E1159 Type 2 diabetes mellitus with other circulatory complications: Secondary | ICD-10-CM

## 2013-04-18 DIAGNOSIS — L8995 Pressure ulcer of unspecified site, unstageable: Secondary | ICD-10-CM

## 2013-04-26 ENCOUNTER — Inpatient Hospital Stay (HOSPITAL_COMMUNITY)
Admission: EM | Admit: 2013-04-26 | Discharge: 2013-05-09 | DRG: 644 | Disposition: A | Discharge status: Home / Self Care | Payer: Medicare Other | Attending: Internal Medicine | Admitting: Internal Medicine

## 2013-04-26 ENCOUNTER — Encounter (HOSPITAL_COMMUNITY): Payer: Self-pay | Admitting: Emergency Medicine

## 2013-04-26 DIAGNOSIS — Z87891 Personal history of nicotine dependence: Secondary | ICD-10-CM

## 2013-04-26 DIAGNOSIS — I5042 Chronic combined systolic (congestive) and diastolic (congestive) heart failure: Secondary | ICD-10-CM

## 2013-04-26 DIAGNOSIS — I509 Heart failure, unspecified: Secondary | ICD-10-CM | POA: Diagnosis present

## 2013-04-26 DIAGNOSIS — B964 Proteus (mirabilis) (morganii) as the cause of diseases classified elsewhere: Secondary | ICD-10-CM | POA: Diagnosis present

## 2013-04-26 DIAGNOSIS — I96 Gangrene, not elsewhere classified: Secondary | ICD-10-CM | POA: Diagnosis present

## 2013-04-26 DIAGNOSIS — Z89439 Acquired absence of unspecified foot: Secondary | ICD-10-CM

## 2013-04-26 DIAGNOSIS — S98919A Complete traumatic amputation of unspecified foot, level unspecified, initial encounter: Secondary | ICD-10-CM

## 2013-04-26 DIAGNOSIS — Z89431 Acquired absence of right foot: Secondary | ICD-10-CM

## 2013-04-26 DIAGNOSIS — Z794 Long term (current) use of insulin: Secondary | ICD-10-CM

## 2013-04-26 DIAGNOSIS — IMO0001 Reserved for inherently not codable concepts without codable children: Secondary | ICD-10-CM

## 2013-04-26 DIAGNOSIS — E213 Hyperparathyroidism, unspecified: Principal | ICD-10-CM

## 2013-04-26 DIAGNOSIS — I2510 Atherosclerotic heart disease of native coronary artery without angina pectoris: Secondary | ICD-10-CM

## 2013-04-26 DIAGNOSIS — E119 Type 2 diabetes mellitus without complications: Secondary | ICD-10-CM

## 2013-04-26 DIAGNOSIS — I447 Left bundle-branch block, unspecified: Secondary | ICD-10-CM

## 2013-04-26 DIAGNOSIS — I2589 Other forms of chronic ischemic heart disease: Secondary | ICD-10-CM | POA: Diagnosis present

## 2013-04-26 DIAGNOSIS — N183 Chronic kidney disease, stage 3 unspecified: Secondary | ICD-10-CM

## 2013-04-26 DIAGNOSIS — D638 Anemia in other chronic diseases classified elsewhere: Secondary | ICD-10-CM

## 2013-04-26 DIAGNOSIS — Z86718 Personal history of other venous thrombosis and embolism: Secondary | ICD-10-CM

## 2013-04-26 DIAGNOSIS — K219 Gastro-esophageal reflux disease without esophagitis: Secondary | ICD-10-CM | POA: Diagnosis present

## 2013-04-26 DIAGNOSIS — I255 Ischemic cardiomyopathy: Secondary | ICD-10-CM

## 2013-04-26 DIAGNOSIS — Z9181 History of falling: Secondary | ICD-10-CM

## 2013-04-26 DIAGNOSIS — Z880 Allergy status to penicillin: Secondary | ICD-10-CM

## 2013-04-26 DIAGNOSIS — N390 Urinary tract infection, site not specified: Secondary | ICD-10-CM

## 2013-04-26 DIAGNOSIS — R296 Repeated falls: Secondary | ICD-10-CM
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
## 2013-04-26 NOTE — ED Notes (Signed)
Bed: WA09 Expected date: 04/26/13 Expected time:  Means of arrival:  Comments: EMS: Fall

## 2013-04-27 ENCOUNTER — Telehealth: Payer: Self-pay

## 2013-04-27 ENCOUNTER — Emergency Department (HOSPITAL_COMMUNITY): Payer: Medicare Other

## 2013-04-27 ENCOUNTER — Encounter (HOSPITAL_COMMUNITY): Payer: Self-pay | Admitting: Internal Medicine

## 2013-04-27 DIAGNOSIS — S98919A Complete traumatic amputation of unspecified foot, level unspecified, initial encounter: Secondary | ICD-10-CM

## 2013-04-27 DIAGNOSIS — I2589 Other forms of chronic ischemic heart disease: Secondary | ICD-10-CM

## 2013-04-27 DIAGNOSIS — W19XXXA Unspecified fall, initial encounter: Secondary | ICD-10-CM

## 2013-04-27 DIAGNOSIS — R296 Repeated falls: Secondary | ICD-10-CM

## 2013-04-27 DIAGNOSIS — I447 Left bundle-branch block, unspecified: Secondary | ICD-10-CM

## 2013-04-27 DIAGNOSIS — E119 Type 2 diabetes mellitus without complications: Secondary | ICD-10-CM

## 2013-04-27 LAB — CBC WITH DIFFERENTIAL/PLATELET
Basophils Absolute: 0 10*3/uL (ref 0.0–0.1)
Eosinophils Absolute: 0.1 10*3/uL (ref 0.0–0.7)
Eosinophils Relative: 1 % (ref 0–5)
HCT: 40.7 % (ref 36.0–46.0)
Lymphocytes Relative: 25 % (ref 12–46)
MCH: 27 pg (ref 26.0–34.0)
MCHC: 31.9 g/dL (ref 30.0–36.0)
MCV: 84.6 fL (ref 78.0–100.0)
Monocytes Absolute: 0.9 10*3/uL (ref 0.1–1.0)
Platelets: 207 10*3/uL (ref 150–400)
RDW: 20.1 % — ABNORMAL HIGH (ref 11.5–15.5)
WBC: 8.9 10*3/uL (ref 4.0–10.5)

## 2013-04-27 LAB — CBC
HCT: 40.4 % (ref 36.0–46.0)
Hemoglobin: 12.8 g/dL (ref 12.0–15.0)
RBC: 4.74 MIL/uL (ref 3.87–5.11)

## 2013-04-27 LAB — CREATININE, SERUM
Creatinine, Ser: 0.9 mg/dL (ref 0.50–1.10)
GFR calc Af Amer: 76 mL/min — ABNORMAL LOW (ref >90)
GFR calc non Af Amer: 65 mL/min — ABNORMAL LOW (ref >90)

## 2013-04-27 LAB — PHOSPHORUS: Phosphorus: 2.2 mg/dL — ABNORMAL LOW (ref 2.3–4.6)

## 2013-04-27 LAB — URINE MICROSCOPIC-ADD ON

## 2013-04-27 LAB — BASIC METABOLIC PANEL
CO2: 24 mEq/L (ref 19–32)
Calcium: 12 mg/dL — ABNORMAL HIGH (ref 8.4–10.5)
Creatinine, Ser: 0.89 mg/dL (ref 0.50–1.10)
GFR calc non Af Amer: 66 mL/min — ABNORMAL LOW (ref >90)

## 2013-04-27 LAB — URINALYSIS, ROUTINE W REFLEX MICROSCOPIC
Glucose, UA: 100 mg/dL — AB
Ketones, ur: NEGATIVE mg/dL
Protein, ur: >300 mg/dL — AB
Urobilinogen, UA: 4 mg/dL — ABNORMAL HIGH (ref 0.0–1.0)

## 2013-04-27 LAB — TROPONIN I
Troponin I: <0.3 ng/mL (ref <0.30)
Troponin I: <0.3 ng/mL (ref <0.30)

## 2013-04-27 LAB — TSH: TSH: 3.014 u[IU]/mL (ref 0.350–4.500)

## 2013-04-27 LAB — GLUCOSE, CAPILLARY

## 2013-04-27 MED ORDER — SODIUM CHLORIDE 0.9 % IV SOLN: 250.0000 mL | INTRAVENOUS | Status: DC | PRN

## 2013-04-27 MED ORDER — LEVOFLOXACIN IN D5W 500 MG/100ML IV SOLN
500.0000 mg | INTRAVENOUS | Status: DC
  Administered 2013-04-27 – 2013-04-29 (×3): 500 mg via INTRAVENOUS
  Filled 2013-04-27 (×4): qty 100

## 2013-04-27 MED ORDER — INSULIN ASPART 100 UNIT/ML ~~LOC~~ SOLN: 0.0000 [IU] | Freq: Every day | SUBCUTANEOUS | Status: DC

## 2013-04-27 MED ORDER — ISOSORBIDE MONONITRATE ER 30 MG PO TB24
30.0000 mg | ORAL_TABLET | Freq: Every day | ORAL | Status: DC | PRN
  Filled 2013-04-27: qty 1

## 2013-04-27 MED ORDER — HEPARIN SODIUM (PORCINE) 5000 UNIT/ML IJ SOLN
5000.0000 [IU] | Freq: Three times a day (TID) | INTRAMUSCULAR | Status: DC
  Administered 2013-04-27 – 2013-04-28 (×2): 5000 [IU] via SUBCUTANEOUS
  Filled 2013-04-27 (×40): qty 1

## 2013-04-27 MED ORDER — SODIUM CHLORIDE 0.9 % IJ SOLN
3.0000 mL | Freq: Two times a day (BID) | INTRAMUSCULAR | Status: DC
  Administered 2013-05-09: 3 mL via INTRAVENOUS

## 2013-04-27 MED ORDER — SODIUM CHLORIDE 0.9 % IJ SOLN: 3.0000 mL | INTRAMUSCULAR | Status: DC | PRN

## 2013-04-27 MED ORDER — SODIUM CHLORIDE 0.9 % IV SOLN
INTRAVENOUS | Status: AC
  Administered 2013-04-27: 15:00:00 via INTRAVENOUS

## 2013-04-27 MED ORDER — CALCITONIN (SALMON) 200 UNIT/ML IJ SOLN
50.0000 [IU] | Freq: Every day | INTRAMUSCULAR | Status: DC
  Administered 2013-04-27 – 2013-04-28 (×2): 50 [IU] via INTRAMUSCULAR
  Filled 2013-04-27 (×2): qty 0.25

## 2013-04-27 MED ORDER — DIPHENHYDRAMINE HCL 25 MG PO CAPS
25.0000 mg | ORAL_CAPSULE | Freq: Four times a day (QID) | ORAL | Status: DC | PRN
  Administered 2013-04-27: 25 mg via ORAL
  Filled 2013-04-27: qty 1

## 2013-04-27 MED ORDER — INSULIN ASPART 100 UNIT/ML ~~LOC~~ SOLN
0.0000 [IU] | Freq: Three times a day (TID) | SUBCUTANEOUS | Status: DC
  Administered 2013-04-27 (×2): 1 [IU] via SUBCUTANEOUS
  Administered 2013-04-27: 2 [IU] via SUBCUTANEOUS
  Administered 2013-04-28 – 2013-04-29 (×4): 1 [IU] via SUBCUTANEOUS
  Administered 2013-04-29 – 2013-04-30 (×2): 2 [IU] via SUBCUTANEOUS
  Administered 2013-05-01 (×3): 1 [IU] via SUBCUTANEOUS
  Administered 2013-05-02: 13:00:00 2 [IU] via SUBCUTANEOUS
  Administered 2013-05-02 (×2): 1 [IU] via SUBCUTANEOUS
  Administered 2013-05-03: 2 [IU] via SUBCUTANEOUS
  Administered 2013-05-03: 09:00:00 1 [IU] via SUBCUTANEOUS
  Administered 2013-05-03: 18:00:00 2 [IU] via SUBCUTANEOUS
  Administered 2013-05-04: 1 [IU] via SUBCUTANEOUS
  Administered 2013-05-04 (×2): 2 [IU] via SUBCUTANEOUS
  Administered 2013-05-05 – 2013-05-07 (×5): 1 [IU] via SUBCUTANEOUS

## 2013-04-27 MED ORDER — MORPHINE SULFATE 2 MG/ML IJ SOLN
2.0000 mg | INTRAMUSCULAR | Status: DC | PRN
  Administered 2013-05-01 – 2013-05-07 (×6): 2 mg via INTRAVENOUS
  Filled 2013-04-27 (×8): qty 1

## 2013-04-27 MED ORDER — SODIUM CHLORIDE 0.9 % IJ SOLN
3.0000 mL | Freq: Two times a day (BID) | INTRAMUSCULAR | Status: DC
  Administered 2013-04-27 – 2013-05-06 (×9): 3 mL via INTRAVENOUS

## 2013-04-27 MED ORDER — ASPIRIN EC 81 MG PO TBEC
81.0000 mg | DELAYED_RELEASE_TABLET | Freq: Every day | ORAL | Status: DC
  Administered 2013-04-27 – 2013-05-09 (×13): 81 mg via ORAL
  Filled 2013-04-27 (×13): qty 1

## 2013-04-27 NOTE — ED Notes (Signed)
Was checking for room to write note.  Did not mean to click  In and Out cath.

## 2013-04-27 NOTE — ED Notes (Signed)
Unsuccessful with In and Out cath.  Informed RN will try again later.   Pt had just eliminated bladder.
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
## 2013-04-27 NOTE — ED Notes (Signed)
Hospitalist, Dr. Conley Rolls, at bedside.
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
## 2013-04-28 DIAGNOSIS — N390 Urinary tract infection, site not specified: Secondary | ICD-10-CM

## 2013-04-28 DIAGNOSIS — D638 Anemia in other chronic diseases classified elsewhere: Secondary | ICD-10-CM

## 2013-04-28 LAB — BASIC METABOLIC PANEL
Calcium: 11.7 mg/dL — ABNORMAL HIGH (ref 8.4–10.5)
GFR calc Af Amer: 86 mL/min — ABNORMAL LOW (ref >90)
GFR calc non Af Amer: 74 mL/min — ABNORMAL LOW (ref >90)
Glucose, Bld: 139 mg/dL — ABNORMAL HIGH (ref 70–99)
Sodium: 135 mEq/L (ref 135–145)

## 2013-04-28 LAB — PTH, INTACT AND CALCIUM: PTH: 201 pg/mL — ABNORMAL HIGH (ref 14.0–72.0)

## 2013-04-28 LAB — GLUCOSE, CAPILLARY
Glucose-Capillary: 110 mg/dL — ABNORMAL HIGH (ref 70–99)
Glucose-Capillary: 149 mg/dL — ABNORMAL HIGH (ref 70–99)

## 2013-04-28 MED ORDER — SODIUM CHLORIDE 0.9 % IV SOLN
INTRAVENOUS | Status: DC
  Administered 2013-04-28 – 2013-04-29 (×2): via INTRAVENOUS
  Administered 2013-04-30: 50 mL/h via INTRAVENOUS

## 2013-04-28 MED ORDER — CALCITONIN (SALMON) 200 UNIT/ML IJ SOLN
100.0000 [IU] | Freq: Every day | INTRAMUSCULAR | Status: DC
  Administered 2013-04-29 – 2013-05-07 (×9): 100 [IU] via INTRAMUSCULAR
  Filled 2013-04-28 (×15): qty 0.5

## 2013-04-28 MED ORDER — DIPHENHYDRAMINE HCL 25 MG PO CAPS
25.0000 mg | ORAL_CAPSULE | ORAL | Status: DC
  Administered 2013-04-28 – 2013-05-02 (×4): 25 mg via ORAL
  Filled 2013-04-28 (×5): qty 1

## 2013-04-28 MED ORDER — DIPHENHYDRAMINE HCL 25 MG PO CAPS
25.0000 mg | ORAL_CAPSULE | Freq: Two times a day (BID) | ORAL | Status: DC | PRN
  Administered 2013-04-29: 22:00:00 25 mg via ORAL
  Filled 2013-04-28: qty 1

## 2013-04-28 NOTE — Progress Notes (Signed)
[facility] PROGRESS NOTE  HOWARD PATTON WUJ:[id number] DOB: Jul 13, 1946 DOA: 04/26/2013 PCP: No primary provider on file.  Assessment/Plan: Active Problems: Hypercalcemia -Gradually improving on subcutaneous calcitonin, will resume cautious (given her history of cardiomyopathy/CHF)hydration -Elevated PTH consistent with hyperparathyroidism and hypophosphatemia noted -follow upon pending labs and pt will need endocrinology inpatient versus outpatient  UTI (urinary tract infection) -Continue Levaquin with premedication and follow culture  s/p Fall   DM, Type 2 IDDM -Continue sliding scale insulin   Ischemic cardiomyopathy/CAD,  EF 25% Oct 2013- improved to 30% by 07/20/12 echo -Stable, continue monitoring of fluid status was gentle hydration for #1 as above S/P transmetatarsal amputation of foot      Code Status: full Family Communication: none at bedside  Disposition Plan: snf vs home   Consultants:  none  Procedures:  none  Antibiotics:   Levaquin started on 9/4  HPI/Subjective: Patient denies chest pain, dizziness weakness and no shortness of breath.  Objective: Filed Vitals:   04/28/13 1317  BP: 114/76  Pulse: 85  Temp: 97.5 F (36.4 C)  Resp: 18    Intake/Output Summary (Last 24 hours) at 04/28/13 1418 Last data filed at 04/28/13 1300  Gross per 24 hour  Intake 1601.67 ml  Output    700 ml  Net 901.67 ml   Filed Weights   04/27/13 0305  Weight: 76.3 kg (168 lb 3.4 oz)    Exam:  General: alert & oriented x 3 In NAD Cardiovascular: RRR, nl S1 s2 Respiratory: CTAB Abdomen: soft +BS NT/ND, no masses palpable Extremities: No cyanosis and no edema-status post right forefoot amputation with heel protectors on      Data Reviewed: Basic Metabolic Panel:  Recent Labs Lab 04/27/13 0105 04/27/13 0515 04/27/13 1950 04/28/13 0351  NA 134*  --   --  135  K 4.3  --   --  3.9  CL 103  --   --  105  CO2 24  --   --  25  GLUCOSE 165*  --    --  139*  BUN 32*  --   --  25*  CREATININE 0.89 0.90  --  0.81  CALCIUM 12.0*  --   --  11.7*  PHOS  --   --  2.2*  --    Liver Function Tests: No results found for this basename: AST, ALT, ALKPHOS, BILITOT, PROT, ALBUMIN,  in the last 168 hours No results found for this basename: LIPASE, AMYLASE,  in the last 168 hours No results found for this basename: AMMONIA,  in the last 168 hours CBC:  Recent Labs Lab 04/27/13 0105 04/27/13 0515  WBC 8.9 7.4  NEUTROABS 5.6  --   HGB 13.0 12.8  HCT 40.7 40.4  MCV 84.6 85.2  PLT 207 202   Cardiac Enzymes:  Recent Labs Lab 04/27/13 0105 04/27/13 0515 04/27/13 1048 04/27/13 1510  TROPONINI <0.30 <0.30 <0.30 <0.30   BNP (last 3 results)  Recent Labs  07/17/12 0738 07/20/12 0520 10/15/12 2008  PROBNP 4495.0* 4108.0* 19285.0*   CBG:  Recent Labs Lab 04/27/13 1153 04/27/13 1604 04/27/13 2056 04/28/13 0727 04/28/13 1144  GLUCAP 180* 122* 140* 110* 141*    No results found for this or any previous visit (from the past 240 hour(s)).   Studies: Dg Chest 2 View  04/27/2013   *RADIOLOGY REPORT*  Clinical Data: Fall.  CHEST - 2 VIEW  Comparison: 01/05/2013.  Findings: Chronic cardiopericardial enlargement.  Similar to prior, low volume lungs  with band-like opacities. Prominent perihilar vessels. Small pleural effusions.  No overt edema in the upper lungs.  No evidence of acute fracture.  IMPRESSION: 1. Low volume lungs with basilar atelectasis and pleural fluid. 2.  Pulmonary venous congestion.   Original Report Authenticated By: Tiburcio Pea    Scheduled Meds: . aspirin EC  81 mg Oral Daily  . calcitonin  50 Units Intramuscular Daily  . heparin  5,000 Units Subcutaneous Q8H  . insulin aspart  0-5 Units Subcutaneous QHS  . insulin aspart  0-9 Units Subcutaneous TID WC  . levofloxacin (LEVAQUIN) IV  500 mg Intravenous Q24H  . sodium chloride  3 mL Intravenous Q12H  . sodium chloride  3 mL Intravenous Q12H   Continuous  Infusions:   Principal Problem:   Falling Active Problems:   LBBB (left bundle branch block)   DM, Type 2 IDDM   Ischemic cardiomyopathy, EF 25% Oct 2013- improved to 30% by 07/20/12 echo   CAD, 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 She needs CABG   Gangrene of foot, Rt foot transmetatarsal amputation 10/16/12   S/P transmetatarsal amputation of foot    Time spent: 35    Ason Heslin C  [facility] Pager [phone]. If 7PM-7AM, please contact night-coverage at [email or web address], password [facility] 04/28/2013, 2:18 PM  LOS: 2 days

## 2013-04-29 DIAGNOSIS — E213 Hyperparathyroidism, unspecified: Principal | ICD-10-CM

## 2013-04-29 LAB — BASIC METABOLIC PANEL
Calcium: 11.6 mg/dL — ABNORMAL HIGH (ref 8.4–10.5)
GFR calc Af Amer: 59 mL/min — ABNORMAL LOW (ref >90)
GFR calc non Af Amer: 51 mL/min — ABNORMAL LOW (ref >90)
Glucose, Bld: 128 mg/dL — ABNORMAL HIGH (ref 70–99)
Potassium: 4.8 mEq/L (ref 3.5–5.1)
Sodium: 132 mEq/L — ABNORMAL LOW (ref 135–145)

## 2013-04-29 LAB — GLUCOSE, CAPILLARY
Glucose-Capillary: 141 mg/dL — ABNORMAL HIGH (ref 70–99)
Glucose-Capillary: 141 mg/dL — ABNORMAL HIGH (ref 70–99)

## 2013-04-29 MED ORDER — FUROSEMIDE 10 MG/ML IJ SOLN
40.0000 mg | Freq: Once | INTRAMUSCULAR | Status: AC
  Administered 2013-04-29: 40 mg via INTRAVENOUS
  Filled 2013-04-29: qty 4

## 2013-04-29 MED ORDER — DIPHENHYDRAMINE HCL 25 MG PO CAPS
25.0000 mg | ORAL_CAPSULE | Freq: Four times a day (QID) | ORAL | Status: DC | PRN
  Administered 2013-05-01 – 2013-05-09 (×19): 25 mg via ORAL
  Filled 2013-04-29 (×21): qty 1

## 2013-04-29 NOTE — Progress Notes (Signed)
Patient has been refusing heparin injection despite explanation of it's benefits. Patient believed that since she's been taking aspirin that, that alone will help in preventing blood clots.

## 2013-04-29 NOTE — Progress Notes (Addendum)
[facility] PROGRESS NOTE  [name] [name] WNU:[id number] DOB: 1945-10-09 DOA: 04/26/2013 PCP: No primary provider on file.  Assessment/Plan: Active Problems: Hypercalcemia/Hyperparathyroidism - no change today, increase subcutaneous calcitonin, continue cautious (given her history of cardiomyopathy/CHF)hydration and give dose of lasix -Elevated PTH and hypophosphatemia consistent with hyperparathyroidism , will obtain alkaline phosphatase level follow further Eval as appropriate  -no endocrinologist available this weekend for consultation, follow. Proteus Mirabilis UTI (urinary tract infection) -Continue Levaquin with premedication and follow culture sensitivity  s/p Fall -plansTo go home with home health PT as per social service note   DM, Type 2 IDDM -Continue sliding scale insulin   Ischemic cardiomyopathy/CAD,  EF 25% Oct 2013- improved to 30% by 07/20/12 echo -Stable, continue monitoring of fluid status was gentle hydration for #1 as above S/P transmetatarsal amputation of foot      Code Status: full Family Communication: none at bedside  Disposition Plan: snf vs home   Consultants:  none  Procedures:  none  Antibiotics:   Levaquin started on 9/4  HPI/Subjective: Patient itching with antibiotics/Levaquin, denies any rash no shortness of breath  Objective: Filed Vitals:   04/29/13 1307  BP: 116/81  Pulse: 81  Temp: 97.3 F (36.3 C)  Resp: 18    Intake/Output Summary (Last 24 hours) at 04/29/13 1430 Last data filed at 04/29/13 1300  Gross per 24 hour  Intake    580 ml  Output    525 ml  Net     55 ml   Filed Weights   04/27/13 0305 04/29/13 0612  Weight: 76.3 kg (168 lb 3.4 oz) 77.5 kg (170 lb 13.7 oz)    Exam:  General: alert & oriented x 3 In NAD Cardiovascular: RRR, nl S1 s2 Respiratory: CTAB Abdomen: soft +BS NT/ND, no masses palpable Extremities: No cyanosis and trace edema, status post right forefoot amputation with heel  protectors on      Data Reviewed: Basic Metabolic Panel:  Recent Labs Lab 04/27/13 0105 04/27/13 0515 04/27/13 1510 04/27/13 1950 04/28/13 0351 04/29/13 0417  NA 134*  --   --   --  135 132*  K 4.3  --   --   --  3.9 4.8  CL 103  --   --   --  105 102  CO2 24  --   --   --  25 25  GLUCOSE 165*  --   --   --  139* 128*  BUN 32*  --   --   --  25* 28*  CREATININE 0.89 0.90  --   --  0.81 1.11*  CALCIUM 12.0*  --  12.1*  --  11.7* 11.6*  PHOS  --   --   --  2.2*  --   --    Liver Function Tests: No results found for this basename: AST, ALT, ALKPHOS, BILITOT, PROT, ALBUMIN,  in the last 168 hours No results found for this basename: LIPASE, AMYLASE,  in the last 168 hours No results found for this basename: AMMONIA,  in the last 168 hours CBC:  Recent Labs Lab 04/27/13 0105 04/27/13 0515  WBC 8.9 7.4  NEUTROABS 5.6  --   HGB 13.0 12.8  HCT 40.7 40.4  MCV 84.6 85.2  PLT 207 202   Cardiac Enzymes:  Recent Labs Lab 04/27/13 0105 04/27/13 0515 04/27/13 1048 04/27/13 1510  TROPONINI <0.30 <0.30 <0.30 <0.30   BNP (last 3 results)  Recent Labs  07/17/12 0738 07/20/12 0520 10/15/12 2008  PROBNP 4495.0* 4108.0* 19285.0*   CBG:  Recent Labs Lab 04/28/13 0727 04/28/13 1144 04/28/13 1639 04/28/13 2120 04/29/13 0755  GLUCAP 110* 141* 149* 123* 141*    Recent Results (from the past 240 hour(s))  URINE CULTURE     Status: None   Collection Time    04/27/13 11:34 PM      Result Value Range Status   Specimen Description URINE, CLEAN CATCH   Final   Special Requests NONE   Final   Culture  Setup Time     Final   Value: 04/28/2013 05:41     Performed at [facility] Count     Final   Value: >=100,000 COLONIES/ML     Performed at [facility]   Culture     Final   Value: PROTEUS MIRABILIS     Performed at [facility]   Report Status PENDING   Incomplete     Studies: No results found.  Scheduled Meds: . aspirin EC   81 mg Oral Daily  . calcitonin  100 Units Intramuscular Daily  . diphenhydrAMINE  25 mg Oral Q24H  . heparin  5,000 Units Subcutaneous Q8H  . insulin aspart  0-5 Units Subcutaneous QHS  . insulin aspart  0-9 Units Subcutaneous TID WC  . levofloxacin (LEVAQUIN) IV  500 mg Intravenous Q24H  . sodium chloride  3 mL Intravenous Q12H  . sodium chloride  3 mL Intravenous Q12H   Continuous Infusions: . sodium chloride 50 mL/hr at 04/28/13 1853    Principal Problem:   Falling Active Problems:   LBBB (left bundle branch block)   DM, Type 2 IDDM   Ischemic cardiomyopathy, EF 25% Oct 2013- improved to 30% by 07/20/12 echo   CAD, 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 She needs CABG   Gangrene of foot, Rt foot transmetatarsal amputation 10/16/12   S/P transmetatarsal amputation of foot   Hypercalcemia   UTI (urinary tract infection)    Time spent: 25    [facility] C  [facility] Pager [phone]. If 7PM-7AM, please contact night-coverage at [email or web address], password [facility] 04/29/2013, 2:30 PM  LOS: 3 days

## 2013-04-29 NOTE — Progress Notes (Signed)
Asked by Pt's daughter to meet with her and Pt to discuss d/c options.  Pt did not remember meeting with CSW Unice Bailey on the 4th.  CSW, Pt and daughter discussed d/c options and Pt is aware that she can't d/c home unless she has 24-hr care.  She doesn't really want to go to a SNF, either, as she doesn't want to sign over her M[employer]ca[employer] check.   Provided Pt and daughter a SNF list.  Pt and daughter to review and to discuss d/c plan.  Weekday CSW to follow.  Providence Crosby, LCSWA Clinical Social Work [phone]

## 2013-04-29 NOTE — Progress Notes (Signed)
PT Cancellation Note  Patient Details Name: [name] [name] MRN: [id number] DOB: May 08, 1946   Cancelled Treatment:    Reason Eval/Treat Not Completed: Patient declined, no reason specified. Pt politely refused due to waiting on lunch (2:30) and she refused to do anything until after lunch. I checked to make sure lunch was ordered and it had been. Discussed importance of moving and getting out of bed , pt stated, "I can't", continued to explain that is what we are here for to help you get stronger in order to begin doing some of these things.    Hanley Seamen, [place] Pager: [phone] 04/29/2013  04/29/2013, 5:47 PM

## 2013-04-30 LAB — COMPREHENSIVE METABOLIC PANEL
AST: 26 U/L (ref 0–37)
Albumin: 2 g/dL — ABNORMAL LOW (ref 3.5–5.2)
Alkaline Phosphatase: 263 U/L — ABNORMAL HIGH (ref 39–117)
BUN: 29 mg/dL — ABNORMAL HIGH (ref 6–23)
Potassium: 4 mEq/L (ref 3.5–5.1)
Sodium: 135 mEq/L (ref 135–145)
Total Protein: 6.1 g/dL (ref 6.0–8.3)

## 2013-04-30 LAB — GLUCOSE, CAPILLARY
Glucose-Capillary: 122 mg/dL — ABNORMAL HIGH (ref 70–99)
Glucose-Capillary: 140 mg/dL — ABNORMAL HIGH (ref 70–99)
Glucose-Capillary: 170 mg/dL — ABNORMAL HIGH (ref 70–99)

## 2013-04-30 MED ORDER — LEVOFLOXACIN 500 MG PO TABS
500.0000 mg | ORAL_TABLET | Freq: Every day | ORAL | Status: DC
  Administered 2013-04-30: 500 mg via ORAL
  Filled 2013-04-30 (×4): qty 1

## 2013-04-30 MED ORDER — FUROSEMIDE 10 MG/ML IJ SOLN
20.0000 mg | Freq: Once | INTRAMUSCULAR | Status: AC
  Administered 2013-04-30: 20 mg via INTRAVENOUS
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
## 2013-04-30 NOTE — Progress Notes (Signed)
Pt has been refusing heparin since admission.  DVT risk was explained in detail to the patient.  Pt is determined- she will not take heparin.

## 2013-04-30 NOTE — Progress Notes (Signed)
04/30/2013 1120 Reviewed referral and plan is dc to SNF. CSW following pt for SNF. Waiting final dc position.  Isidoro Donning RN CCM Case Mgmt phone [phone]

## 2013-04-30 NOTE — Progress Notes (Signed)
[facility] PROGRESS NOTE  ESSENCE MERLE XBJ:[id number] DOB: 08-31-1945 DOA: 04/26/2013 PCP: No primary provider on file.  Assessment/Plan: Active Problems: Hypercalcemia/Hyperparathyroidism -Calcium trending down today, continue subcutaneous calcitonin, continue cautious (given her history of cardiomyopathy/CHF)hydration and give dose of lasix -Elevated PTH and hypophosphatemia consistent with hyperparathyroidism,alkaline phosphatase elevated at 263 level follow further Eval as appropriate  -no endocrinologist available this weekend for consultation, followup in am. Proteus Mirabilis UTI (urinary tract infection) -Continue Levaquin with premedication and follow culture sensitivity  s/p Fall - home with home health PT vs SNF, follow   DM, Type 2 IDDM -Continue sliding scale insulin   Ischemic cardiomyopathy/CAD,  EF 25% Oct 2013- improved to 30% by 07/20/12 echo -Stable, continue monitoring of fluid status was gentle hydration for #1 as above S/P transmetatarsal amputation of foot      Code Status: full Family Communication: none at bedside  Disposition Plan: snf vs home   Consultants:  none  Procedures:  none  Antibiotics:   Levaquin started on 9/4  HPI/Subjective: Denies any new complaints.  Objective: Filed Vitals:   04/30/13 0500  BP: 113/74  Pulse: 82  Temp: 97.6 F (36.4 C)  Resp: 18    Intake/Output Summary (Last 24 hours) at 04/30/13 1252 Last data filed at 04/30/13 3086  Gross per 24 hour  Intake   1085 ml  Output   1475 ml  Net   -390 ml   Filed Weights   04/27/13 0305 04/29/13 0612 04/30/13 0500  Weight: 76.3 kg (168 lb 3.4 oz) 77.5 kg (170 lb 13.7 oz) 77.1 kg (169 lb 15.6 oz)    Exam:  General: alert & oriented x 3 In NAD Cardiovascular: RRR, nl S1 s2 Respiratory: CTAB Abdomen: soft +BS NT/ND, no masses palpable Extremities: No cyanosis and trace edema, status post right forefoot amputation with heel protectors on       Data Reviewed: Basic Metabolic Panel:  Recent Labs Lab 04/27/13 0105 04/27/13 0515 04/27/13 1510 04/27/13 1950 04/28/13 0351 04/29/13 0417 04/30/13 0530  NA 134*  --   --   --  135 132* 135  K 4.3  --   --   --  3.9 4.8 4.0  CL 103  --   --   --  105 102 106  CO2 24  --   --   --  25 25 23   GLUCOSE 165*  --   --   --  139* 128* 110*  BUN 32*  --   --   --  25* 28* 29*  CREATININE 0.89 0.90  --   --  0.81 1.11* 1.16*  CALCIUM 12.0*  --  12.1*  --  11.7* 11.6* 10.8*  PHOS  --   --   --  2.2*  --   --   --    Liver Function Tests:  Recent Labs Lab 04/30/13 0530  AST 26  ALT 19  ALKPHOS 263*  BILITOT 0.8  PROT 6.1  ALBUMIN 2.0*   No results found for this basename: LIPASE, AMYLASE,  in the last 168 hours No results found for this basename: AMMONIA,  in the last 168 hours CBC:  Recent Labs Lab 04/27/13 0105 04/27/13 0515  WBC 8.9 7.4  NEUTROABS 5.6  --   HGB 13.0 12.8  HCT 40.7 40.4  MCV 84.6 85.2  PLT 207 202   Cardiac Enzymes:  Recent Labs Lab 04/27/13 0105 04/27/13 0515 04/27/13 1048 04/27/13 1510  TROPONINI <0.30 <0.30 <0.30 <0.30  BNP (last 3 results)  Recent Labs  07/17/12 0738 07/20/12 0520 10/15/12 2008  PROBNP 4495.0* 4108.0* 19285.0*   CBG:  Recent Labs Lab 04/29/13 0755 04/29/13 1130 04/29/13 1709 04/29/13 2206 04/30/13 0745  GLUCAP 141* 173* 141* 143* 122*    Recent Results (from the past 240 hour(s))  URINE CULTURE     Status: None   Collection Time    04/27/13 11:34 PM      Result Value Range Status   Specimen Description URINE, CLEAN CATCH   Final   Special Requests NONE   Final   Culture  Setup Time     Final   Value: 04/28/2013 05:41     Performed at [facility] Count     Final   Value: >=100,000 COLONIES/ML     Performed at [facility]   Culture     Final   Value: PROTEUS MIRABILIS     Performed at [facility]   Report Status PENDING   Incomplete      Studies: No results found.  Scheduled Meds: . aspirin EC  81 mg Oral Daily  . calcitonin  100 Units Intramuscular Daily  . diphenhydrAMINE  25 mg Oral Q24H  . heparin  5,000 Units Subcutaneous Q8H  . insulin aspart  0-5 Units Subcutaneous QHS  . insulin aspart  0-9 Units Subcutaneous TID WC  . levofloxacin  500 mg Oral QHS  . sodium chloride  3 mL Intravenous Q12H  . sodium chloride  3 mL Intravenous Q12H   Continuous Infusions: . sodium chloride 50 mL/hr at 04/29/13 1439    Principal Problem:   Falling Active Problems:   LBBB (left bundle branch block)   DM, Type 2 IDDM   Ischemic cardiomyopathy, EF 25% Oct 2013- improved to 30% by 07/20/12 echo   CAD, 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 She needs CABG   Gangrene of foot, Rt foot transmetatarsal amputation 10/16/12   S/P transmetatarsal amputation of foot   Hypercalcemia   UTI (urinary tract infection)    Time spent: 25    [facility] C  [facility] Pager [phone]. If 7PM-7AM, please contact night-coverage at [email or web address], password [facility] 04/30/2013, 12:52 PM  LOS: 4 days

## 2013-04-30 NOTE — Progress Notes (Signed)
PHARMACIST - PHYSICIAN COMMUNICATION CONCERNING: Antibiotic IV to Oral Route Change Policy  RECOMMENDATION: This patient is receiving Levaquin by the intravenous route.  Based on criteria approved by the Pharmacy and Therapeutics Committee, the antibiotic(s) is/are being converted to the equivalent oral dose form(s).   DESCRIPTION: These criteria include:  Patient being treated for a respiratory tract infection, urinary tract infection, or cellulitis  The patient is not neutropenic and does not exhibit a GI malabsorption state  The patient is eating (either orally or via tube) and/or has been taking other orally administered medications for a least 24 hours  The patient is improving clinically and has a Tmax < 100.5  If you have questions about this conversion, please contact the Pharmacy Department  []   [phone] )  [facility] []   [phone] )  [facility]  []   [phone] )  [facility] [x]   [phone] )  [facility]    Geoffry Paradise, PharmD, [place] Pager: [phone] 11:04 AM Pharmacy #: [phone]

## 2013-05-01 LAB — UIFE/LIGHT CHAINS/TP QN, 24-HR UR
Albumin, U: DETECTED
Alpha 1, Urine: DETECTED — AB
Alpha 2, Urine: DETECTED — AB
Beta, Urine: DETECTED — AB
Free Lambda Lt Chains,Ur: 2.34 mg/dL — ABNORMAL HIGH (ref 0.02–0.67)

## 2013-05-01 LAB — BASIC METABOLIC PANEL
CO2: 23 mEq/L (ref 19–32)
Calcium: 10.6 mg/dL — ABNORMAL HIGH (ref 8.4–10.5)
GFR calc non Af Amer: 50 mL/min — ABNORMAL LOW (ref >90)
Potassium: 3.8 mEq/L (ref 3.5–5.1)
Sodium: 135 mEq/L (ref 135–145)

## 2013-05-01 LAB — PROTEIN ELECTROPHORESIS, SERUM
Alpha-1-Globulin: 5.3 % — ABNORMAL HIGH (ref 2.9–4.9)
Alpha-2-Globulin: 11.6 % (ref 7.1–11.8)
Beta 2: 8.7 % — ABNORMAL HIGH (ref 3.2–6.5)
Beta Globulin: 7 % (ref 4.7–7.2)
Gamma Globulin: 25.1 % — ABNORMAL HIGH (ref 11.1–18.8)

## 2013-05-01 LAB — GLUCOSE, CAPILLARY
Glucose-Capillary: 149 mg/dL — ABNORMAL HIGH (ref 70–99)
Glucose-Capillary: 139 mg/dL — ABNORMAL HIGH (ref 70–99)

## 2013-05-01 LAB — URINE CULTURE

## 2013-05-01 MED ORDER — FUROSEMIDE 10 MG/ML IJ SOLN
20.0000 mg | Freq: Once | INTRAMUSCULAR | Status: AC
  Administered 2013-05-01: 20 mg via INTRAVENOUS
  Filled 2013-05-01: qty 2

## 2013-05-01 MED ORDER — DEXTROSE 5 % IV SOLN
1.0000 g | Freq: Three times a day (TID) | INTRAVENOUS | Status: DC
  Administered 2013-05-01 – 2013-05-09 (×25): 1 g via INTRAVENOUS
  Filled 2013-05-01 (×27): qty 1

## 2013-05-01 MED ORDER — COLLAGENASE 250 UNIT/GM EX OINT
TOPICAL_OINTMENT | Freq: Every day | CUTANEOUS | Status: DC
  Administered 2013-05-01 – 2013-05-09 (×8): via TOPICAL
  Filled 2013-05-01: qty 30

## 2013-05-01 NOTE — Progress Notes (Signed)
[facility] PROGRESS NOTE  [name] [name] GEX:[id number] DOB: [birth year] DOA: 04/26/2013 PCP: No primary provider on file.  Assessment/Plan: Active Problems: Hypercalcemia/Hyperparathyroidism -Calcium trending down today, continue subcutaneous calcitonin, will Hep-Lock IV fluids (given her history of cardiomyopathy/CHF) she appears edematous today give dose of lasix and follow -Elevated PTH and hypophosphatemia consistent with hyperparathyroidism,alkaline phosphatase elevated at 263 level follow further Eval as appropriate  -Consulted/as patient with endocrine/Dr. Lucianne Muss and he recommends followup with him in the office the eval and determine possibility surgical treatment of a hyperparathyroid right -he recommends for her to see him in one to 2 weeks  Proteus Mirabilis UTI (urinary tract infection) -Will change to them as Proteus resistant to Levaquin and patient with allergies to other meds  s/p Fall - Per social work today patient does not want to give up her Medicaid check and so SNF, case manager to help with setting up outpatient antibiotics and home health   DM, Type 2 IDDM -Continue sliding scale insulin   Ischemic cardiomyopathy/CAD,  EF 25% Oct 2013- improved to 30% by 07/20/12 echo -As bove, with peripheral edema on exam>> S/P transmetatarsal amputation of foot      Code Status: full Family Communication: none at bedside  Disposition Plan: DC to home with home health services in a.m.   Consultants:  none  Procedures:  none  Antibiotics:   Levaquin started on 9/4  HPI/Subjective: Complaining of swelling in hands.denies Shortness of breath  Objective: Filed Vitals:   05/01/13 0501  BP: 121/73  Pulse: 84  Temp: 98 F (36.7 C)  Resp: 19    Intake/Output Summary (Last 24 hours) at 05/01/13 1431 Last data filed at 05/01/13 0830  Gross per 24 hour  Intake   1290 ml  Output   1675 ml  Net   -385 ml   Filed Weights   04/29/13 0612 04/30/13 0500  05/01/13 0501  Weight: 77.5 kg (170 lb 13.7 oz) 77.1 kg (169 lb 15.6 oz) 79.924 kg (176 lb 3.2 oz)    Exam:  General: alert & oriented x 3 In NAD Cardiovascular: RRR, nl S1 s2 Respiratory: CTAB Abdomen: soft +BS NT/ND, no masses palpable Extremities: No cyanosis and +1 edema, status post right forefoot amputation with heel protectors on      Data Reviewed: Basic Metabolic Panel:  Recent Labs Lab 04/27/13 0105 04/27/13 0515 04/27/13 1510 04/27/13 1950 04/28/13 0351 04/29/13 0417 04/30/13 0530 05/01/13 0505  NA 134*  --   --   --  135 132* 135 135  K 4.3  --   --   --  3.9 4.8 4.0 3.8  CL 103  --   --   --  105 102 106 106  CO2 24  --   --   --  25 25 23 23   GLUCOSE 165*  --   --   --  139* 128* 110* 132*  BUN 32*  --   --   --  25* 28* 29* 34*  CREATININE 0.89 0.90  --   --  0.81 1.11* 1.16* 1.13*  CALCIUM 12.0*  --  12.1*  --  11.7* 11.6* 10.8* 10.6*  PHOS  --   --   --  2.2*  --   --   --   --    Liver Function Tests:  Recent Labs Lab 04/30/13 0530  AST 26  ALT 19  ALKPHOS 263*  BILITOT 0.8  PROT 6.1  ALBUMIN 2.0*   No results found  for this basename: LIPASE, AMYLASE,  in the last 168 hours No results found for this basename: AMMONIA,  in the last 168 hours CBC:  Recent Labs Lab 04/27/13 0105 04/27/13 0515  WBC 8.9 7.4  NEUTROABS 5.6  --   HGB 13.0 12.8  HCT 40.7 40.4  MCV 84.6 85.2  PLT 207 202   Cardiac Enzymes:  Recent Labs Lab 04/27/13 0105 04/27/13 0515 04/27/13 1048 04/27/13 1510  TROPONINI <0.30 <0.30 <0.30 <0.30   BNP (last 3 results)  Recent Labs  07/17/12 0738 07/20/12 0520 10/15/12 2008  PROBNP 4495.0* 4108.0* 19285.0*   CBG:  Recent Labs Lab 04/30/13 1200 04/30/13 1704 04/30/13 2053 05/01/13 0758 05/01/13 1213  GLUCAP 140* 177* 170* 121* 149*    Recent Results (from the past 240 hour(s))  URINE CULTURE     Status: None   Collection Time    04/27/13 11:34 PM      Result Value Range Status   Specimen  Description URINE, CLEAN CATCH   Final   Special Requests NONE   Final   Culture  Setup Time     Final   Value: 04/28/2013 05:41     Performed at [facility] Count     Final   Value: >=100,000 COLONIES/ML     Performed at [facility]   Culture     Final   Value: PROTEUS MIRABILIS     Performed at [facility]   Report Status 05/01/2013 FINAL   Final   Organism ID, Bacteria PROTEUS MIRABILIS   Final     Studies: No results found.  Scheduled Meds: . aspirin EC  81 mg Oral Daily  . aztreonam  1 g Intravenous Q8H  . calcitonin  100 Units Intramuscular Daily  . collagenase   Topical Daily  . diphenhydrAMINE  25 mg Oral Q24H  . heparin  5,000 Units Subcutaneous Q8H  . insulin aspart  0-5 Units Subcutaneous QHS  . insulin aspart  0-9 Units Subcutaneous TID WC  . sodium chloride  3 mL Intravenous Q12H  . sodium chloride  3 mL Intravenous Q12H   Continuous Infusions: . sodium chloride 50 mL/hr (04/30/13 2210)    Principal Problem:   Falling Active Problems:   LBBB (left bundle branch block)   DM, Type 2 IDDM   Ischemic cardiomyopathy, EF 25% Oct 2013- improved to 30% by 07/20/12 echo   CAD, 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 She needs CABG   Gangrene of foot, Rt foot transmetatarsal amputation 10/16/12   S/P transmetatarsal amputation of foot   Hypercalcemia   UTI (urinary tract infection)    Time spent: 25    [facility]  [facility] Pager [phone]. If 7PM-7AM, please contact night-coverage at [email or web address], password [facility] 05/01/2013, 2:31 PM  LOS: 5 days
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
## 2013-05-01 NOTE — Consult Note (Addendum)
WOC consult Note Reason for Consult: Non healing surgical wound Right foot.  Toe amputation site. Eschar to right heel.   Wound type: Surgical wound right foot.  Pressure ulcer right heel, present on admission.  Pressure Ulcer POA: Yes Measurement: Right foot, surgical wound measures 4 cm x 3 cm x 1 cm.  Undermining from 4:00 to 10:00, extends 1.5 cm.  Eschar to right heel 4 cm x 5 cm.  Intact. No drainage.  Wound bed: surgical wound:  20% slough, 80% pink, non granulating. Right heel; unable to visualize wound bed, 100% eschar.  Drainage (amount, consistency, odor)  Right foot surgical site, minimal serosanguinous.  No drainage from right heel.  Periwound: right foot surgical site, intact.  Right heel has slight erythema around periwound.  Dressing procedure/placement/frequency: Right foot surgical site; Recommend Santyl to slough in wound bed, top with NS moist dressing.  Change daily.  Right heel is intact and not draining at this time.  Patient denies pain.  Will offload heels and leave eschar intact unless wound begins draining or eschar becomes boggy.  Prevalon boot in place.   WOC nursing team will  follow at this time to monitor debridement of surgical wound and status of heel eschar. Maple Hudson RN BSN CWON Pager [phone]

## 2013-05-01 NOTE — Progress Notes (Signed)
CSW spoke with patient re: discharge planning. Patient states that she plans to return home with home health services at discharge. Patient is aware that she has exhausted her 100 Medicare days and would have to sign over her Medicaid check to the SNF as a payor. Patient states that she is not willing to do that.   CSW spoke with Dr. Suanne Marker - anticipating discharge tomorrow. Patient states that she will try to get ahold of her friend, Maureen Chatters to see if he could pick her up. CSW to follow-up tomorrow.   Unice Bailey, LCSW [facility] Clinical Social Worker cell #: [phone]

## 2013-05-02 LAB — BASIC METABOLIC PANEL
BUN: 33 mg/dL — ABNORMAL HIGH (ref 6–23)
CO2: 24 mEq/L (ref 19–32)
Calcium: 10.9 mg/dL — ABNORMAL HIGH (ref 8.4–10.5)
Chloride: 104 mEq/L (ref 96–112)
Creatinine, Ser: 1.04 mg/dL (ref 0.50–1.10)

## 2013-05-02 LAB — GLUCOSE, CAPILLARY: Glucose-Capillary: 137 mg/dL — ABNORMAL HIGH (ref 70–99)

## 2013-05-02 MED ORDER — ACETAMINOPHEN 325 MG PO TABS
650.0000 mg | ORAL_TABLET | Freq: Once | ORAL | Status: AC
  Administered 2013-05-02: 18:00:00 650 mg via ORAL
  Filled 2013-05-02: qty 2

## 2013-05-02 MED ORDER — ONDANSETRON HCL 4 MG/2ML IJ SOLN
4.0000 mg | Freq: Four times a day (QID) | INTRAMUSCULAR | Status: DC | PRN
  Administered 2013-05-02 – 2013-05-05 (×2): 4 mg via INTRAVENOUS
  Filled 2013-05-02 (×2): qty 2

## 2013-05-02 MED ORDER — FUROSEMIDE 10 MG/ML IJ SOLN
40.0000 mg | Freq: Once | INTRAMUSCULAR | Status: AC
  Administered 2013-05-02: 18:00:00 40 mg via INTRAVENOUS
  Filled 2013-05-02: qty 4

## 2013-05-02 NOTE — Progress Notes (Signed)
PT Cancellation Note  Patient Details Name: URANIA PEARLMAN MRN: [id number] DOB: [birth year]   Cancelled Treatment:    Reason Eval/Treat Not Completed: Patient declined, no reason specified. Pt stated she's going home today and really doesn't want to get up to recliner right now.    Ralene Bathe Kistler 05/02/2013, 1:08 PM [phone]

## 2013-05-02 NOTE — Progress Notes (Signed)
Spoke with pt today with CSW concerning discharge plans. [facility] will not take the pt back for [facility]. Pt with no PCP. Offered to make an appointment fro pt with a PCP,  Or [facility]. Pt states I can not get there and I will not go. Explained to pt that she need 5 more days of IV antibiotics. Pt states she has no choice, medicare days are used up and she will not give up her medicaid to go to SNF. Home Health Agencies will  Not take a pt with no PCP.

## 2013-05-02 NOTE — Progress Notes (Signed)
[facility] PROGRESS NOTE  [name] [name] ZOX:[id number] DOB: [birth year] DOA: 04/26/2013 PCP: No primary provider on file.  Assessment/Plan: Active Problems: Hypercalcemia/Hyperparathyroidism -Calcium trending down today, continue subcutaneous calcitonin, Hep-Locked IV fluids 9/8(given her history of cardiomyopathy/CHF) and peripheral edema  Give another dose of lasix and follow -Elevated PTH and hypophosphatemia consistent with hyperparathyroidism,alkaline phosphatase elevated at 263 level follow further Eval as appropriate  -Consulted/as patient with endocrine/Dr. Lucianne Muss and he recommends followup with him in the office the eval and determine possibility surgical treatment of a hyperparathyroid right -he recommends for her to see him in one to 2 weeks from discharge  Proteus Mirabilis UTI (urinary tract infection) -Continue Aztreonam(to complete 5-7 days) as Proteus resistant to Levaquin and patient with allergies to other meds -Patient will need to complete IV antibiotic course  inpatient as no home health agency will see her outpt and can't go to SNF- see below  s/p Fall - Per social work today patient does not want to give up her Medicaid check and so SNF  DM, Type 2 IDDM -Continue sliding scale insulin   Ischemic cardiomyopathy/CAD,  EF 25% Oct 2013- improved to 30% by 07/20/12 echo -As above, with peripheral edema on exam>> S/P transmetatarsal amputation of foot      Code Status: full Family Communication: none at bedside  Disposition Plan: DC to home with home health services in a.m. Will need to followup with Dr. Kumar/endocrinologist when discharged as above   Consultants:  none  Procedures:  none  Antibiotics:   Levaquin started on 9/4>>9/8  Aztreonam started on 9/8  HPI/Subjective: c/o swelling in hands, denies Shortness of breath  Objective: Filed Vitals:   05/02/13 1435  BP: 107/94  Pulse: 83  Temp: 97.7 F (36.5 C)  Resp: 18     Intake/Output Summary (Last 24 hours) at 05/02/13 1751 Last data filed at 05/02/13 1437  Gross per 24 hour  Intake    530 ml  Output   1150 ml  Net   -620 ml   Filed Weights   04/30/13 0500 05/01/13 0501 05/02/13 0456  Weight: 77.1 kg (169 lb 15.6 oz) 79.924 kg (176 lb 3.2 oz) 80.8 kg (178 lb 2.1 oz)    Exam:  General: alert & oriented x 3 In NAD Cardiovascular: RRR, nl S1 s2 Respiratory: CTAB, no crackles and no wheezes Abdomen: soft +BS NT/ND, no masses palpable Extremities: No cyanosis and +1-2 edema, status post right forefoot amputation with heel protectors on      Data Reviewed: Basic Metabolic Panel:  Recent Labs Lab 04/27/13 1950 04/28/13 0351 04/29/13 0417 04/30/13 0530 05/01/13 0505 05/02/13 0505  NA  --  135 132* 135 135 134*  K  --  3.9 4.8 4.0 3.8 3.8  CL  --  105 102 106 106 104  CO2  --  25 25 23 23 24   GLUCOSE  --  139* 128* 110* 132* 140*  BUN  --  25* 28* 29* 34* 33*  CREATININE  --  0.81 1.11* 1.16* 1.13* 1.04  CALCIUM  --  11.7* 11.6* 10.8* 10.6* 10.9*  PHOS 2.2*  --   --   --   --   --    Liver Function Tests:  Recent Labs Lab 04/30/13 0530  AST 26  ALT 19  ALKPHOS 263*  BILITOT 0.8  PROT 6.1  ALBUMIN 2.0*   No results found for this basename: LIPASE, AMYLASE,  in the last 168 hours No results found for this  basename: AMMONIA,  in the last 168 hours CBC:  Recent Labs Lab 04/27/13 0105 04/27/13 0515  WBC 8.9 7.4  NEUTROABS 5.6  --   HGB 13.0 12.8  HCT 40.7 40.4  MCV 84.6 85.2  PLT 207 202   Cardiac Enzymes:  Recent Labs Lab 04/27/13 0105 04/27/13 0515 04/27/13 1048 04/27/13 1510  TROPONINI <0.30 <0.30 <0.30 <0.30   BNP (last 3 results)  Recent Labs  07/17/12 0738 07/20/12 0520 10/15/12 2008  PROBNP 4495.0* 4108.0* 19285.0*   CBG:  Recent Labs Lab 05/01/13 1654 05/01/13 2109 05/02/13 0715 05/02/13 1129 05/02/13 1702  GLUCAP 139* 159* 121* 151* 137*    Recent Results (from the past 240 hour(s))   URINE CULTURE     Status: None   Collection Time    04/27/13 11:34 PM      Result Value Range Status   Specimen Description URINE, CLEAN CATCH   Final   Special Requests NONE   Final   Culture  Setup Time     Final   Value: 04/28/2013 05:41     Performed at [facility] Count     Final   Value: >=100,000 COLONIES/ML     Performed at [facility]   Culture     Final   Value: PROTEUS MIRABILIS     Performed at [facility]   Report Status 05/01/2013 FINAL   Final   Organism ID, Bacteria PROTEUS MIRABILIS   Final     Studies: No results found.  Scheduled Meds: . acetaminophen  650 mg Oral Once  . aspirin EC  81 mg Oral Daily  . aztreonam  1 g Intravenous Q8H  . calcitonin  100 Units Intramuscular Daily  . collagenase   Topical Daily  . diphenhydrAMINE  25 mg Oral Q24H  . furosemide  40 mg Intravenous Once  . heparin  5,000 Units Subcutaneous Q8H  . insulin aspart  0-5 Units Subcutaneous QHS  . insulin aspart  0-9 Units Subcutaneous TID WC  . sodium chloride  3 mL Intravenous Q12H  . sodium chloride  3 mL Intravenous Q12H   Continuous Infusions:    Principal Problem:   Falling Active Problems:   LBBB (left bundle branch block)   DM, Type 2 IDDM   Ischemic cardiomyopathy, EF 25% Oct 2013- improved to 30% by 07/20/12 echo   CAD, 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 She needs CABG   Gangrene of foot, Rt foot transmetatarsal amputation 10/16/12   S/P transmetatarsal amputation of foot   Hypercalcemia   UTI (urinary tract infection)    Time spent: 25    [facility]  [facility] Pager [phone]. If 7PM-7AM, please contact night-coverage at [email or web address], password [facility] 05/02/2013, 5:51 PM  LOS: 6 days

## 2013-05-03 LAB — GLUCOSE, CAPILLARY
Glucose-Capillary: 145 mg/dL — ABNORMAL HIGH (ref 70–99)
Glucose-Capillary: 138 mg/dL — ABNORMAL HIGH (ref 70–99)
Glucose-Capillary: 189 mg/dL — ABNORMAL HIGH (ref 70–99)

## 2013-05-03 LAB — VITAMIN D 1,25 DIHYDROXY: Vitamin D2 1, 25 (OH)2: <8 pg/mL

## 2013-05-03 LAB — PTH-RELATED PEPTIDE: PTH-related peptide: 16 pg/mL (ref 14–27)

## 2013-05-03 MED ORDER — IBUPROFEN 800 MG PO TABS
400.0000 mg | ORAL_TABLET | Freq: Four times a day (QID) | ORAL | Status: DC | PRN
  Administered 2013-05-03 – 2013-05-08 (×7): 400 mg via ORAL
  Filled 2013-05-03 (×8): qty 1

## 2013-05-03 MED ORDER — FUROSEMIDE 10 MG/ML IJ SOLN
40.0000 mg | Freq: Once | INTRAMUSCULAR | Status: AC
  Administered 2013-05-03: 40 mg via INTRAVENOUS
  Filled 2013-05-03: qty 4

## 2013-05-03 MED ORDER — FAMOTIDINE 20 MG PO TABS
20.0000 mg | ORAL_TABLET | Freq: Every day | ORAL | Status: DC
  Administered 2013-05-05 – 2013-05-09 (×5): 20 mg via ORAL
  Filled 2013-05-03 (×6): qty 1

## 2013-05-03 NOTE — Progress Notes (Signed)
[facility] PROGRESS NOTE  [name] [name]:[id number] DOB: 09-23-1945 DOA: 04/26/2013 PCP: No primary provider on file.  Assessment/Plan: Active Problems: Hypercalcemia/Hyperparathyroidism -Calcium trending down today, continue subcutaneous calcitonin, will Hep-Lock IV fluids (given her history of cardiomyopathy/CHF) she appears edematous today give dose of lasix and follow -Elevated PTH and hypophosphatemia consistent with hyperparathyroidism,alkaline phosphatase elevated at 263 level follow further Eval as appropriate  -Consulted/as patient with endocrine/Dr. Lucianne Muss and he recommends followup with him in the office the eval and determine possibility surgical treatment of a hyperparathyroid right -he recommends for her to see him in one to 2 weeks  Proteus Mirabilis UTI (urinary tract infection) -Urine culture with Proteus Mirabilis resistant to Levaquin and patient with allergies to other meds. Patient been started on and aztreonam secondary to his multiple allergies. Follow.  s/p Fall - Per social work today patient does not want to give up her Medicaid check and so SNF, case manager to help with setting up outpatient antibiotics and home health   DM, Type 2 IDDM -Continue sliding scale insulin   Ischemic cardiomyopathy/CAD,  EF 25% Oct 2013- improved to 30% by 07/20/12 echo -As bove, with peripheral edema on exam>> S/P transmetatarsal amputation of foot      Code Status: full Family Communication: none at bedside  Disposition Plan: DC to home when medically stable   Consultants:  none  Procedures:  none  Antibiotics:   Levaquin started on 9/4---> 9/8 and/14  IV  Aztreonam 05/01/2013  HPI/Subjective: Complaining of swelling in hands  Denies Shortness of breath  Objective: Filed Vitals:   05/03/13 0500  BP: 117/81  Pulse: 84  Temp: 97.9 F (36.6 C)  Resp: 16    Intake/Output Summary (Last 24 hours) at 05/03/13 1319 Last data filed at 05/03/13 1114  Gross per 24 hour  Intake 608.34 ml  Output   1000 ml  Net -391.66 ml   Filed Weights   05/01/13 0501 05/02/13 0456 05/03/13 0500  Weight: 79.924 kg (176 lb 3.2 oz) 80.8 kg (178 lb 2.1 oz) 80.876 kg (178 lb 4.8 oz)    Exam:  General: alert & oriented x 3 In NAD Cardiovascular: RRR, nl S1 s2 Respiratory: CTAB Abdomen: soft +BS NT/ND, no masses palpable Extremities: No cyanosis and +1 edema, status post right forefoot amputation with heel protectors on      Data Reviewed: Basic Metabolic Panel:  Recent Labs Lab 04/27/13 1950 04/28/13 0351 04/29/13 0417 04/30/13 0530 05/01/13 0505 05/02/13 0505  NA  --  135 132* 135 135 134*  K  --  3.9 4.8 4.0 3.8 3.8  CL  --  105 102 106 106 104  CO2  --  25 25 23 23 24   GLUCOSE  --  139* 128* 110* 132* 140*  BUN  --  25* 28* 29* 34* 33*  CREATININE  --  0.81 1.11* 1.16* 1.13* 1.04  CALCIUM  --  11.7* 11.6* 10.8* 10.6* 10.9*  PHOS 2.2*  --   --   --   --   --    Liver Function Tests:  Recent Labs Lab 04/30/13 0530  AST 26  ALT 19  ALKPHOS 263*  BILITOT 0.8  PROT 6.1  ALBUMIN 2.0*   No results found for this basename: LIPASE, AMYLASE,  in the last 168 hours No results found for this basename: AMMONIA,  in the last 168 hours CBC:  Recent Labs Lab 04/27/13 0105 04/27/13 0515  WBC 8.9 7.4  NEUTROABS 5.6  --  HGB 13.0 12.8  HCT 40.7 40.4  MCV 84.6 85.2  PLT 207 202   Cardiac Enzymes:  Recent Labs Lab 04/27/13 0105 04/27/13 0515 04/27/13 1048 04/27/13 1510  TROPONINI <0.30 <0.30 <0.30 <0.30   BNP (last 3 results)  Recent Labs  07/17/12 0738 07/20/12 0520 10/15/12 2008  PROBNP 4495.0* 4108.0* 19285.0*   CBG:  Recent Labs Lab 05/02/13 1129 05/02/13 1702 05/02/13 2152 05/03/13 0758 05/03/13 1215  GLUCAP 151* 137* 145* 138* 189*    Recent Results (from the past 240 hour(s))  URINE CULTURE     Status: None   Collection Time    04/27/13 11:34 PM      Result Value Range Status   Specimen  Description URINE, CLEAN CATCH   Final   Special Requests NONE   Final   Culture  Setup Time     Final   Value: 04/28/2013 05:41     Performed at [facility] Count     Final   Value: >=100,000 COLONIES/ML     Performed at [facility]   Culture     Final   Value: PROTEUS MIRABILIS     Performed at [facility]   Report Status 05/01/2013 FINAL   Final   Organism ID, Bacteria PROTEUS MIRABILIS   Final     Studies: No results found.  Scheduled Meds: . aspirin EC  81 mg Oral Daily  . aztreonam  1 g Intravenous Q8H  . calcitonin  100 Units Intramuscular Daily  . collagenase   Topical Daily  . heparin  5,000 Units Subcutaneous Q8H  . insulin aspart  0-5 Units Subcutaneous QHS  . insulin aspart  0-9 Units Subcutaneous TID WC  . sodium chloride  3 mL Intravenous Q12H  . sodium chloride  3 mL Intravenous Q12H   Continuous Infusions:    Principal Problem:   Falling Active Problems:   LBBB (left bundle branch block)   DM, Type 2 IDDM   Ischemic cardiomyopathy, EF 25% Oct 2013- improved to 30% by 07/20/12 echo   CAD, 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 She needs CABG   Gangrene of foot, Rt foot transmetatarsal amputation 10/16/12   S/P transmetatarsal amputation of foot   Hypercalcemia   UTI (urinary tract infection)    Time spent:> 30 mins    [facility]  [facility] Pager [phone]. If 7PM-7AM, please contact night-coverage at [email or web address], password [facility] 05/03/2013, 1:19 PM  LOS: 7 days

## 2013-05-04 DIAGNOSIS — I509 Heart failure, unspecified: Secondary | ICD-10-CM

## 2013-05-04 DIAGNOSIS — I5042 Chronic combined systolic (congestive) and diastolic (congestive) heart failure: Secondary | ICD-10-CM

## 2013-05-04 LAB — GLUCOSE, CAPILLARY
Glucose-Capillary: 152 mg/dL — ABNORMAL HIGH (ref 70–99)
Glucose-Capillary: 172 mg/dL — ABNORMAL HIGH (ref 70–99)

## 2013-05-04 LAB — CBC
HCT: 41 % (ref 36.0–46.0)
Hemoglobin: 12.9 g/dL (ref 12.0–15.0)
MCV: 86.3 fL (ref 78.0–100.0)
RBC: 4.75 MIL/uL (ref 3.87–5.11)
RDW: 21.2 % — ABNORMAL HIGH (ref 11.5–15.5)
WBC: 7.5 10*3/uL (ref 4.0–10.5)

## 2013-05-04 LAB — BASIC METABOLIC PANEL
CO2: 25 mEq/L (ref 19–32)
Chloride: 107 mEq/L (ref 96–112)
Creatinine, Ser: 1.12 mg/dL — ABNORMAL HIGH (ref 0.50–1.10)
GFR calc Af Amer: 58 mL/min — ABNORMAL LOW (ref >90)
Potassium: 4.3 mEq/L (ref 3.5–5.1)
Sodium: 137 mEq/L (ref 135–145)

## 2013-05-04 NOTE — Progress Notes (Signed)
Appointment for PCP made with [facility]. Julio Sicks, Monday 05/08/13 at 2:00 PM. [facility] has agreed to take pt for Home Health needs. Pt was active with [facility], who will not provide service to pt. Information given to pt who agreed with PCP appointment and Home Health Agency.

## 2013-05-04 NOTE — Progress Notes (Signed)
PT Cancellation Note  Patient Details Name: [name] [name] MRN: [id number] DOB: 1946-05-16   Cancelled Treatment:    Reason Eval/Treat Not Completed: Patient declined, no reason specified. Pt refused to get OOB 2* R foot pain, pain meds requested. Discussed benefits of mobility and risks of prolonged immobility, pt voiced understanding.    Ralene Bathe Kistler 05/04/2013, 1:15 PM [phone]

## 2013-05-04 NOTE — Progress Notes (Signed)
[facility] PROGRESS NOTE  [name] [name]:[id number] DOB: Apr 09, 1946 DOA: 04/26/2013 PCP: No primary provider on file.  Assessment/Plan: Active Problems: Hypercalcemia/Hyperparathyroidism -Calcium trending down today. Corrected calcium 12.2. Continue subcutaneous calcitonin. Hep-Lock IV fluids (given her history of cardiomyopathy/CHF) she appears edematous today given dose of lasix yesterday.  -Elevated PTH and hypophosphatemia consistent with hyperparathyroidism,alkaline phosphatase elevated at 263 level follow further Eval as appropriate  -Dr Suanne Marker Consulted/as patient with endocrine/Dr. Lucianne Muss and he recommends followup with him in the office the eval and determine possibility surgical treatment of a hyperparathyroid right -he recommends for her to see him in one to 2 weeks  Proteus Mirabilis UTI (urinary tract infection) -Urine culture with Proteus Mirabilis resistant to Levaquin and patient with allergies to other meds. Patient been started on and aztreonam D3/7-10 secondary to his multiple allergies. Follow.  s/p Fall - Per social work patient does not want to give up her Medicaid check.   DM, Type 2 IDDM -Continue sliding scale insulin   Ischemic cardiomyopathy/CAD,  EF 25% Oct 2013- improved to 30% by 07/20/12 echo -As bove, with peripheral edema on exam>> S/P transmetatarsal amputation of foot      Code Status: full Family Communication: none at bedside  Disposition Plan: DC to home after IV antibiotic course.   Consultants:  none  Procedures:  none  Antibiotics:   Levaquin started on 9/4---> 9/8 and/14  IV  Aztreonam 05/01/2013  HPI/Subjective: Complaining of swelling in hands unchanged.  Denies Shortness of breath  Objective: Filed Vitals:   05/04/13 0429  BP: 115/74  Pulse: 91  Temp: 97.5 F (36.4 C)  Resp: 18    Intake/Output Summary (Last 24 hours) at 05/04/13 1122 Last data filed at 05/04/13 0600  Gross per 24 hour  Intake   1434 ml   Output   1150 ml  Net    284 ml   Filed Weights   05/02/13 0456 05/03/13 0500 05/04/13 0500  Weight: 80.8 kg (178 lb 2.1 oz) 80.876 kg (178 lb 4.8 oz) 80.749 kg (178 lb 0.3 oz)    Exam:  General: alert & oriented x 3 In NAD Cardiovascular: RRR, nl S1 s2 Respiratory: Bibasilar crackles Abdomen: soft +BS NT/ND, no masses palpable Extremities: No cyanosis and +1 edema, status post right forefoot amputation with heel protectors on      Data Reviewed: Basic Metabolic Panel:  Recent Labs Lab 04/27/13 1950  04/29/13 0417 04/30/13 0530 05/01/13 0505 05/02/13 0505 05/04/13 0439  NA  --   < > 132* 135 135 134* 137  K  --   < > 4.8 4.0 3.8 3.8 4.3  CL  --   < > 102 106 106 104 107  CO2  --   < > 25 23 23 24 25   GLUCOSE  --   < > 128* 110* 132* 140* 160*  BUN  --   < > 28* 29* 34* 33* 37*  CREATININE  --   < > 1.11* 1.16* 1.13* 1.04 1.12*  CALCIUM  --   < > 11.6* 10.8* 10.6* 10.9* 10.6*  PHOS 2.2*  --   --   --   --   --   --   < > = values in this interval not displayed. Liver Function Tests:  Recent Labs Lab 04/30/13 0530  AST 26  ALT 19  ALKPHOS 263*  BILITOT 0.8  PROT 6.1  ALBUMIN 2.0*   No results found for this basename: LIPASE, AMYLASE,  in the  last 168 hours No results found for this basename: AMMONIA,  in the last 168 hours CBC:  Recent Labs Lab 05/04/13 0439  WBC 7.5  HGB 12.9  HCT 41.0  MCV 86.3  PLT 158   Cardiac Enzymes:  Recent Labs Lab 04/27/13 1510  TROPONINI <0.30   BNP (last 3 results)  Recent Labs  07/17/12 0738 07/20/12 0520 10/15/12 2008  PROBNP 4495.0* 4108.0* 19285.0*   CBG:  Recent Labs Lab 05/03/13 0758 05/03/13 1215 05/03/13 1736 05/03/13 2157 05/04/13 0725  GLUCAP 138* 189* 154* 152* 126*    Recent Results (from the past 240 hour(s))  URINE CULTURE     Status: None   Collection Time    04/27/13 11:34 PM      Result Value Range Status   Specimen Description URINE, CLEAN CATCH   Final   Special Requests  NONE   Final   Culture  Setup Time     Final   Value: 04/28/2013 05:41     Performed at [facility] Count     Final   Value: >=100,000 COLONIES/ML     Performed at [facility]   Culture     Final   Value: PROTEUS MIRABILIS     Performed at [facility]   Report Status 05/01/2013 FINAL   Final   Organism ID, Bacteria PROTEUS MIRABILIS   Final     Studies: No results found.  Scheduled Meds: . aspirin EC  81 mg Oral Daily  . aztreonam  1 g Intravenous Q8H  . calcitonin  100 Units Intramuscular Daily  . collagenase   Topical Daily  . famotidine  20 mg Oral Daily  . heparin  5,000 Units Subcutaneous Q8H  . insulin aspart  0-5 Units Subcutaneous QHS  . insulin aspart  0-9 Units Subcutaneous TID WC  . sodium chloride  3 mL Intravenous Q12H  . sodium chloride  3 mL Intravenous Q12H   Continuous Infusions:    Principal Problem:   Falling Active Problems:   LBBB (left bundle branch block)   DM, Type 2 IDDM   Ischemic cardiomyopathy, EF 25% Oct 2013- improved to 30% by 07/20/12 echo   CAD, 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 If 7PM-7AM, please contact night-coverage at [email or web address], password [facility] 05/04/2013, 11:22 AM  LOS: 8 days

## 2013-05-04 NOTE — Progress Notes (Addendum)
Encouraged/educated patient on the importance of getting OOB. Reluctantly, pt agreed. Pt required steadying assist x2 on each side while using walker to [facility] and then to chair. RN encouraged patient to sit up for dinner and then RN would assist patient back to bed.  J.Masud Holub, RN

## 2013-05-05 LAB — COMPREHENSIVE METABOLIC PANEL
Alkaline Phosphatase: 306 U/L — ABNORMAL HIGH (ref 39–117)
BUN: 39 mg/dL — ABNORMAL HIGH (ref 6–23)
CO2: 26 mEq/L (ref 19–32)
GFR calc Af Amer: 65 mL/min — ABNORMAL LOW (ref >90)
GFR calc non Af Amer: 56 mL/min — ABNORMAL LOW (ref >90)
Glucose, Bld: 172 mg/dL — ABNORMAL HIGH (ref 70–99)
Potassium: 4.2 mEq/L (ref 3.5–5.1)
Total Bilirubin: 0.7 mg/dL (ref 0.3–1.2)
Total Protein: 5.8 g/dL — ABNORMAL LOW (ref 6.0–8.3)

## 2013-05-05 MED ORDER — FUROSEMIDE 10 MG/ML IJ SOLN
60.0000 mg | Freq: Once | INTRAMUSCULAR | Status: AC
  Administered 2013-05-05: 60 mg via INTRAVENOUS
  Filled 2013-05-05: qty 6

## 2013-05-05 NOTE — Progress Notes (Signed)
Pt states that she can not go to a doctor because she has no way. Pt continues to state that she would not keep appointment. However, pt agree to allow doctor or NP to come to her. Spoke with Marletta Lor NP of [facility] [phone], who will take pt on for PCP. Information faxed to [phone]. She will need to know when pt is discharged. Thanks to [facility] for their hard work to assist this pt with [facility] needs.

## 2013-05-05 NOTE — Progress Notes (Signed)
CSW continues to follow for discharge planning. Though patient refuses to go back to SNF (does not want to sign over her Medicaid check as she has exhausted her Medicare days @ [facility]), patient will need [facility] transport home most likely.   CSW anticipating discharge Monday after her last dose of her IV antibiotic. Patient states that she will try to get ahold of her friend, Maureen Chatters to see if he could pick her up. CSW to follow-up Monday.    Unice Bailey, LCSW [facility] Clinical Social Worker cell #: [phone]

## 2013-05-05 NOTE — Progress Notes (Signed)
PT Cancellation Note  ___Treatment cancelled today due to medical issues with patient which prohibited   therapy  ___ Treatment cancelled today due to patient receiving procedure or test   ___ Treatment cancelled today due to patient's refusal to participate   _X_ Treatment cancelled today due to pt's request.  "I need time to think and my daughter is coming later".              Felecia Shelling  PTA [facility]  Acute  Rehab Pager      [phone]

## 2013-05-05 NOTE — Progress Notes (Signed)
[facility] PROGRESS NOTE  [name] [name] OZH:[id number] DOB: [birth year] DOA: 04/26/2013 PCP: No primary provider on file.  Assessment/Plan: Active Problems: Hypercalcemia/Hyperparathyroidism -Calcium trending down today. Corrected calcium 12.2. Continue subcutaneous calcitonin. Hep-Lock IV fluids (given her history of cardiomyopathy/CHF) she appears edematous today given dose of lasix yesterday.  -Elevated PTH and hypophosphatemia consistent with hyperparathyroidism,alkaline phosphatase elevated at 263 level follow further Eval as appropriate  -Dr Suanne Marker Consulted/ with endocrine/Dr. Lucianne Muss and he recommends followup with him in the office the eval and determine possibility surgical treatment of a hyperparathyroid right -he recommends for her to see him in one to 2 weeks  Proteus Mirabilis UTI (urinary tract infection) -Urine culture with Proteus Mirabilis resistant to Levaquin and patient with allergies to other meds. Patient been started on and aztreonam D4/7-10 secondary to his multiple allergies. Follow.  s/p Fall - Per social work patient does not want to give up her Medicaid check.   DM, Type 2 IDDM -Continue sliding scale insulin   Ischemic cardiomyopathy/CAD,  EF 25% Oct 2013- improved to 30% by 07/20/12 echo -As bove, with peripheral edema on exam>> S/P transmetatarsal amputation of foot      Code Status: full Family Communication: none at bedside  Disposition Plan: DC to home after IV antibiotic course.   Consultants:  none  Procedures:  none  Antibiotics:   Levaquin started on 9/4---> 9/8 and/14  IV  Aztreonam 05/01/2013  HPI/Subjective: Complaining of swelling in hands unchanged.  Denies Shortness of breath  Objective: Filed Vitals:   05/05/13 0629  BP: 110/70  Pulse: 87  Temp: 98.3 F (36.8 C)  Resp: 18    Intake/Output Summary (Last 24 hours) at 05/05/13 1209 Last data filed at 05/05/13 0900  Gross per 24 hour  Intake    610 ml  Output     600 ml  Net     10 ml   Filed Weights   05/03/13 0500 05/04/13 0500 05/05/13 0629  Weight: 80.876 kg (178 lb 4.8 oz) 80.749 kg (178 lb 0.3 oz) 83.553 kg (184 lb 3.2 oz)    Exam:  General: alert & oriented x 3 In NAD Cardiovascular: RRR, nl S1 s2 Respiratory: Bibasilar crackles Abdomen: soft +BS NT/ND, no masses palpable Extremities: No cyanosis and +1 edema, status post right forefoot amputation with heel protectors on      Data Reviewed: Basic Metabolic Panel:  Recent Labs Lab 04/30/13 0530 05/01/13 0505 05/02/13 0505 05/04/13 0439 05/05/13 0412  NA 135 135 134* 137 136  K 4.0 3.8 3.8 4.3 4.2  CL 106 106 104 107 106  CO2 23 23 24 25 26   GLUCOSE 110* 132* 140* 160* 172*  BUN 29* 34* 33* 37* 39*  CREATININE 1.16* 1.13* 1.04 1.12* 1.02  CALCIUM 10.8* 10.6* 10.9* 10.6* 10.4   Liver Function Tests:  Recent Labs Lab 04/30/13 0530 05/05/13 0412  AST 26 31  ALT 19 20  ALKPHOS 263* 306*  BILITOT 0.8 0.7  PROT 6.1 5.8*  ALBUMIN 2.0* 1.9*   No results found for this basename: LIPASE, AMYLASE,  in the last 168 hours No results found for this basename: AMMONIA,  in the last 168 hours CBC:  Recent Labs Lab 05/04/13 0439  WBC 7.5  HGB 12.9  HCT 41.0  MCV 86.3  PLT 158   Cardiac Enzymes: No results found for this basename: CKTOTAL, CKMB, CKMBINDEX, TROPONINI,  in the last 168 hours BNP (last 3 results)  Recent Labs  07/17/12 0738 07/20/12 0520  10/15/12 2008  PROBNP 4495.0* 4108.0* 19285.0*   CBG:  Recent Labs Lab 05/04/13 0725 05/04/13 1224 05/04/13 1728 05/04/13 2057 05/05/13 0751  GLUCAP 126* 174* 172* 169* 150*    Recent Results (from the past 240 hour(s))  URINE CULTURE     Status: None   Collection Time    04/27/13 11:34 PM      Result Value Range Status   Specimen Description URINE, CLEAN CATCH   Final   Special Requests NONE   Final   Culture  Setup Time     Final   Value: 04/28/2013 05:41     Performed at [facility] Count     Final   Value: >=100,000 COLONIES/ML     Performed at [facility]   Culture     Final   Value: PROTEUS MIRABILIS     Performed at [facility]   Report Status 05/01/2013 FINAL   Final   Organism ID, Bacteria PROTEUS MIRABILIS   Final     Studies: No results found.  Scheduled Meds: . aspirin EC  81 mg Oral Daily  . aztreonam  1 g Intravenous Q8H  . calcitonin  100 Units Intramuscular Daily  . collagenase   Topical Daily  . famotidine  20 mg Oral Daily  . heparin  5,000 Units Subcutaneous Q8H  . insulin aspart  0-5 Units Subcutaneous QHS  . insulin aspart  0-9 Units Subcutaneous TID WC  . sodium chloride  3 mL Intravenous Q12H  . sodium chloride  3 mL Intravenous Q12H   Continuous Infusions:    Principal Problem:   Falling Active Problems:   LBBB (left bundle branch block)   DM, Type 2 IDDM   Ischemic cardiomyopathy, EF 25% Oct 2013- improved to 30% by 07/20/12 echo   CAD, 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 She needs CABG   Gangrene of foot, Rt foot transmetatarsal amputation 10/16/12   S/P transmetatarsal amputation of foot   Hypercalcemia   UTI (urinary tract infection)    Time spent:> 30 mins    [facility]  [facility] Pager [phone]. If 7PM-7AM, please contact night-coverage at [email or web address], password [facility] 05/05/2013, 12:09 PM  LOS: 9 days

## 2013-05-06 DIAGNOSIS — I2510 Atherosclerotic heart disease of native coronary artery without angina pectoris: Secondary | ICD-10-CM

## 2013-05-06 LAB — GLUCOSE, CAPILLARY
Glucose-Capillary: 169 mg/dL — ABNORMAL HIGH (ref 70–99)
Glucose-Capillary: 166 mg/dL — ABNORMAL HIGH (ref 70–99)
Glucose-Capillary: 137 mg/dL — ABNORMAL HIGH (ref 70–99)

## 2013-05-06 LAB — BASIC METABOLIC PANEL
BUN: 41 mg/dL — ABNORMAL HIGH (ref 6–23)
CO2: 23 mEq/L (ref 19–32)
Calcium: 10.7 mg/dL — ABNORMAL HIGH (ref 8.4–10.5)
Chloride: 105 mEq/L (ref 96–112)
Creatinine, Ser: 0.96 mg/dL (ref 0.50–1.10)
Glucose, Bld: 149 mg/dL — ABNORMAL HIGH (ref 70–99)

## 2013-05-06 LAB — CBC
HCT: 39 % (ref 36.0–46.0)
Hemoglobin: 12.5 g/dL (ref 12.0–15.0)
MCH: 27.6 pg (ref 26.0–34.0)
MCV: 86.1 fL (ref 78.0–100.0)
RBC: 4.53 MIL/uL (ref 3.87–5.11)
WBC: 8.4 10*3/uL (ref 4.0–10.5)

## 2013-05-06 NOTE — Progress Notes (Signed)
Pt has been refusing Heparin injection.

## 2013-05-06 NOTE — Progress Notes (Signed)
[facility] PROGRESS NOTE  JANNELY HENTHORN ZOX:[id number] DOB: September 15, 1945 DOA: 04/26/2013 PCP: No primary provider on file.  Assessment/Plan: Active Problems: Hypercalcemia/Hyperparathyroidism -Calcium trending down today. Corrected calcium 12.2. Continue subcutaneous calcitonin. Hep-Lock IV fluids (given her history of cardiomyopathy/CHF) she appears edematous today given dose of lasix yesterday.  -Elevated PTH and hypophosphatemia consistent with hyperparathyroidism,alkaline phosphatase elevated at 263 level follow further Eval as appropriate  -Dr Suanne Marker Consulted/ with endocrine/Dr. Lucianne Muss and he recommends followup with him in the office the eval and determine possibility surgical treatment of a hyperparathyroid right -he recommends for her to see him in one to 2 weeks  Proteus Mirabilis UTI (urinary tract infection) -Urine culture with Proteus Mirabilis resistant to Levaquin and patient with allergies to other meds. Patient been started on and aztreonam D5/7-10 secondary to his multiple allergies. Follow.  s/p Fall - Per social work patient does not want to give up her Medicaid check.   DM, Type 2 IDDM -Continue sliding scale insulin   Ischemic cardiomyopathy/CAD,  EF 25% Oct 2013- improved to 30% by 07/20/12 echo -As bove, with peripheral edema on exam>> S/P transmetatarsal amputation of foot      Code Status: full Family Communication: none at bedside  Disposition Plan: DC to home after IV antibiotic course.   Consultants:  none  Procedures:  none  Antibiotics:   Levaquin started on 9/4---> 9/8 and/14  IV  Aztreonam 05/01/2013  HPI/Subjective: Complaining of swelling in hands unchanged.  Denies Shortness of breath  Objective: Filed Vitals:   05/06/13 1555  BP: 114/78  Pulse: 94  Temp: 97.4 F (36.3 C)  Resp: 20    Intake/Output Summary (Last 24 hours) at 05/06/13 1622 Last data filed at 05/06/13 1500  Gross per 24 hour  Intake    290 ml  Output    1150 ml  Net   -860 ml   Filed Weights   05/04/13 0500 05/05/13 0629 05/06/13 0534  Weight: 80.749 kg (178 lb 0.3 oz) 83.553 kg (184 lb 3.2 oz) 84.9 kg (187 lb 2.7 oz)    Exam:  General: alert & oriented x 3 In NAD Cardiovascular: RRR, nl S1 s2 Respiratory: Bibasilar crackles Abdomen: soft +BS NT/ND, no masses palpable Extremities: No cyanosis and +1 edema, status post right forefoot amputation with heel protectors on      Data Reviewed: Basic Metabolic Panel:  Recent Labs Lab 05/01/13 0505 05/02/13 0505 05/04/13 0439 05/05/13 0412 05/06/13 0543  NA 135 134* 137 136 136  K 3.8 3.8 4.3 4.2 4.1  CL 106 104 107 106 105  CO2 23 24 25 26 23   GLUCOSE 132* 140* 160* 172* 149*  BUN 34* 33* 37* 39* 41*  CREATININE 1.13* 1.04 1.12* 1.02 0.96  CALCIUM 10.6* 10.9* 10.6* 10.4 10.7*   Liver Function Tests:  Recent Labs Lab 04/30/13 0530 05/05/13 0412  AST 26 31  ALT 19 20  ALKPHOS 263* 306*  BILITOT 0.8 0.7  PROT 6.1 5.8*  ALBUMIN 2.0* 1.9*   No results found for this basename: LIPASE, AMYLASE,  in the last 168 hours No results found for this basename: AMMONIA,  in the last 168 hours CBC:  Recent Labs Lab 05/04/13 0439 05/06/13 0543  WBC 7.5 8.4  HGB 12.9 12.5  HCT 41.0 39.0  MCV 86.3 86.1  PLT 158 158   Cardiac Enzymes: No results found for this basename: CKTOTAL, CKMB, CKMBINDEX, TROPONINI,  in the last 168 hours BNP (last 3 results)  Recent Labs  07/17/12 0738 07/20/12 0520 10/15/12 2008  PROBNP 4495.0* 4108.0* 19285.0*   CBG:  Recent Labs Lab 05/05/13 1202 05/05/13 1622 05/05/13 2203 05/06/13 0747 05/06/13 1223  GLUCAP 144* 140* 121* 144* 169*    Recent Results (from the past 240 hour(s))  URINE CULTURE     Status: None   Collection Time    04/27/13 11:34 PM      Result Value Range Status   Specimen Description URINE, CLEAN CATCH   Final   Special Requests NONE   Final   Culture  Setup Time     Final   Value: 04/28/2013 05:41      Performed at [facility] Count     Final   Value: >=100,000 COLONIES/ML     Performed at [facility]   Culture     Final   Value: PROTEUS MIRABILIS     Performed at [facility]   Report Status 05/01/2013 FINAL   Final   Organism ID, Bacteria PROTEUS MIRABILIS   Final     Studies: No results found.  Scheduled Meds: . aspirin EC  81 mg Oral Daily  . aztreonam  1 g Intravenous Q8H  . calcitonin  100 Units Intramuscular Daily  . collagenase   Topical Daily  . famotidine  20 mg Oral Daily  . heparin  5,000 Units Subcutaneous Q8H  . insulin aspart  0-5 Units Subcutaneous QHS  . insulin aspart  0-9 Units Subcutaneous TID WC  . sodium chloride  3 mL Intravenous Q12H  . sodium chloride  3 mL Intravenous Q12H   Continuous Infusions:    Principal Problem:   Falling Active Problems:   LBBB (left bundle branch block)   DM, Type 2 IDDM   Ischemic cardiomyopathy, EF 25% Oct 2013- improved to 30% by 07/20/12 echo   CAD, 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 She needs CABG   Gangrene of foot, Rt foot transmetatarsal amputation 10/16/12   S/P transmetatarsal amputation of foot   Hypercalcemia   UTI (urinary tract infection)    Time spent:> 30 mins    [facility]  [facility] Pager [phone]. If 7PM-7AM, please contact night-coverage at [email or web address], password [facility] 05/06/2013, 4:22 PM  LOS: 10 days

## 2013-05-07 LAB — BASIC METABOLIC PANEL
CO2: 23 mEq/L (ref 19–32)
Calcium: 10.7 mg/dL — ABNORMAL HIGH (ref 8.4–10.5)
Glucose, Bld: 128 mg/dL — ABNORMAL HIGH (ref 70–99)
Potassium: 4.4 mEq/L (ref 3.5–5.1)
Sodium: 135 mEq/L (ref 135–145)

## 2013-05-07 LAB — GLUCOSE, CAPILLARY
Glucose-Capillary: 136 mg/dL — ABNORMAL HIGH (ref 70–99)
Glucose-Capillary: 131 mg/dL — ABNORMAL HIGH (ref 70–99)
Glucose-Capillary: 128 mg/dL — ABNORMAL HIGH (ref 70–99)

## 2013-05-07 NOTE — Progress Notes (Signed)
[facility] PROGRESS NOTE  [name] [name] YQM:[id number] DOB: 1946-07-30 DOA: 04/26/2013 PCP: No primary provider on file.  Assessment/Plan: Active Problems: Hypercalcemia/Hyperparathyroidism -Calcium fluctuating. Corrected calcium 12.2. Continue subcutaneous calcitonin. Hep-Lock IV fluids (given her history of cardiomyopathy/CHF) she appears edematous today given dose of lasix yesterday.  -Elevated PTH and hypophosphatemia consistent with hyperparathyroidism,alkaline phosphatase elevated at 263 level follow further Eval as appropriate  -Dr Suanne Marker Consulted/ with endocrine/Dr. Lucianne Muss and he recommends followup with him in the office the eval and determine possibility surgical treatment of a hyperparathyroid right -he recommends for her to see him in one to 2 weeks  Proteus Mirabilis UTI (urinary tract infection) -Urine culture with Proteus Mirabilis resistant to Levaquin and patient with allergies to other meds. Patient been started on and aztreonam D6/7-10 secondary to his multiple allergies. Follow.  s/p Fall - Per social work patient does not want to give up her Medicaid check.   DM, Type 2 IDDM -Continue sliding scale insulin   Ischemic cardiomyopathy/CAD,  EF 25% Oct 2013- improved to 30% by 07/20/12 echo -As bove, with peripheral edema on exam>> S/P transmetatarsal amputation of foot      Code Status: full Family Communication: none at bedside  Disposition Plan: DC to home after IV antibiotic course.   Consultants:  none  Procedures:  none  Antibiotics:   Levaquin started on 9/4---> 9/8 and/14  IV  Aztreonam 05/01/2013  HPI/Subjective: Complaining of swelling in hands unchanged.  Denies Shortness of breath  Objective: Filed Vitals:   05/07/13 0500  BP: 105/71  Pulse: 81  Temp: 97.5 F (36.4 C)  Resp: 20    Intake/Output Summary (Last 24 hours) at 05/07/13 1413 Last data filed at 05/07/13 1100  Gross per 24 hour  Intake    650 ml  Output   1100  ml  Net   -450 ml   Filed Weights   05/05/13 0629 05/06/13 0534 05/07/13 0500  Weight: 83.553 kg (184 lb 3.2 oz) 84.9 kg (187 lb 2.7 oz) 88.6 kg (195 lb 5.2 oz)    Exam:  General: alert & oriented x 3 In NAD Cardiovascular: RRR, nl S1 s2 Respiratory: CTAB Abdomen: soft +BS NT/ND, no masses palpable Extremities: No cyanosis and +1 edema, status post right forefoot amputation with heel protectors on      Data Reviewed: Basic Metabolic Panel:  Recent Labs Lab 05/02/13 0505 05/04/13 0439 05/05/13 0412 05/06/13 0543 05/07/13 0455  NA 134* 137 136 136 135  K 3.8 4.3 4.2 4.1 4.4  CL 104 107 106 105 106  CO2 24 25 26 23 23   GLUCOSE 140* 160* 172* 149* 128*  BUN 33* 37* 39* 41* 39*  CREATININE 1.04 1.12* 1.02 0.96 0.92  CALCIUM 10.9* 10.6* 10.4 10.7* 10.7*   Liver Function Tests:  Recent Labs Lab 05/05/13 0412  AST 31  ALT 20  ALKPHOS 306*  BILITOT 0.7  PROT 5.8*  ALBUMIN 1.9*   No results found for this basename: LIPASE, AMYLASE,  in the last 168 hours No results found for this basename: AMMONIA,  in the last 168 hours CBC:  Recent Labs Lab 05/04/13 0439 05/06/13 0543  WBC 7.5 8.4  HGB 12.9 12.5  HCT 41.0 39.0  MCV 86.3 86.1  PLT 158 158   Cardiac Enzymes: No results found for this basename: CKTOTAL, CKMB, CKMBINDEX, TROPONINI,  in the last 168 hours BNP (last 3 results)  Recent Labs  07/17/12 0738 07/20/12 0520 10/15/12 2008  PROBNP 4495.0* 4108.0* 19285.0*  CBG:  Recent Labs Lab 05/06/13 1223 05/06/13 1749 05/06/13 2213 05/07/13 0735 05/07/13 1144  GLUCAP 169* 166* 137* 136* 131*    Recent Results (from the past 240 hour(s))  URINE CULTURE     Status: None   Collection Time    04/27/13 11:34 PM      Result Value Range Status   Specimen Description URINE, CLEAN CATCH   Final   Special Requests NONE   Final   Culture  Setup Time     Final   Value: 04/28/2013 05:41     Performed at [facility] Count     Final    Value: >=100,000 COLONIES/ML     Performed at [facility]   Culture     Final   Value: PROTEUS MIRABILIS     Performed at [facility]   Report Status 05/01/2013 FINAL   Final   Organism ID, Bacteria PROTEUS MIRABILIS   Final     Studies: No results found.  Scheduled Meds: . aspirin EC  81 mg Oral Daily  . aztreonam  1 g Intravenous Q8H  . calcitonin  100 Units Intramuscular Daily  . collagenase   Topical Daily  . famotidine  20 mg Oral Daily  . heparin  5,000 Units Subcutaneous Q8H  . insulin aspart  0-5 Units Subcutaneous QHS  . insulin aspart  0-9 Units Subcutaneous TID WC  . sodium chloride  3 mL Intravenous Q12H  . sodium chloride  3 mL Intravenous Q12H   Continuous Infusions:    Principal Problem:   Falling Active Problems:   LBBB (left bundle branch block)   DM, Type 2 IDDM   Ischemic cardiomyopathy, EF 25% Oct 2013- improved to 30% by 07/20/12 echo   CAD, 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 She needs CABG   Gangrene of foot, Rt foot transmetatarsal amputation 10/16/12   S/P transmetatarsal amputation of foot   Hypercalcemia   UTI (urinary tract infection)    Time spent:> 30 mins    [facility]  [facility] Pager [phone]. If 7PM-7AM, please contact night-coverage at [email or web address], password [facility] 05/07/2013, 2:13 PM  LOS: 11 days

## 2013-05-07 NOTE — Progress Notes (Signed)
Asked by MD and Pt's daughter to speak with Pt, once again, re: SNF.  Pt's daughter and MD concerned about Pt's ability to care for herself.  Spoke with Pt at length re: SNF and her d/c plans.  Pt is adamant that she will not going to SNF, stating, "No, no, no, no, no, no, no, no, no, no, no I will not go to rehab.  Never again."  Pt began crying out of frustration and stated, "I just want to be left alone.  That's all I've ever wanted."  Pt was frustrated with MD and doesn't feel that he has her best interest at heart, although she cannot articulate what she thinks his angle is, with regard to him continuing to recommend SNF.  She reported that she doesn't trust him and that she's not interested in hearing, anymore, his, or anyone else's, SNF recommendations.  Pt repeatedly stated that her rehab experience was horrendous and that all they want is "money."  Pt stated that she is able to care for herself and that she wants to be given the opportunity to do so without any outside help, including HH.  Pt cited her ability to ambulate independently as reason enough to go home.  CSW did attempt to discuss with Pt other SNFs that may be available to her, to which she replied, "No.  I don't care.  I'm not going anywhere."  Pt stated that she has exhausted her M'care days and, thus, she can't go to SNF, even if she wanted to.  CSW attempted to discuss with Pt the option to sign her check over to a SNF, to which she replied, "There's not much left of it, anyway."    CSW thanked Pt for her time and offered CSW services to Pt in the future, should Pt change her mind.  Pt thanked CSW for time and assistance.  Notified MD of CSW's discussion with Pt.  Providence Crosby, LCSWA Clinical Social Work [phone]

## 2013-05-08 ENCOUNTER — Inpatient Hospital Stay (HOSPITAL_COMMUNITY): Payer: Medicare Other

## 2013-05-08 LAB — BASIC METABOLIC PANEL
BUN: 40 mg/dL — ABNORMAL HIGH (ref 6–23)
CO2: 22 mEq/L (ref 19–32)
Calcium: 10.8 mg/dL — ABNORMAL HIGH (ref 8.4–10.5)
Chloride: 103 mEq/L (ref 96–112)
Creatinine, Ser: 1.11 mg/dL — ABNORMAL HIGH (ref 0.50–1.10)

## 2013-05-08 LAB — GLUCOSE, CAPILLARY: Glucose-Capillary: 141 mg/dL — ABNORMAL HIGH (ref 70–99)

## 2013-05-08 MED ORDER — SODIUM CHLORIDE 0.9 % IV SOLN
60.0000 mg | Freq: Once | INTRAVENOUS | Status: AC
  Administered 2013-05-08: 60 mg via INTRAVENOUS
  Filled 2013-05-08: qty 6.67

## 2013-05-08 MED ORDER — SODIUM CHLORIDE 0.9 % IV SOLN
60.0000 mg | Freq: Once | INTRAVENOUS | Status: DC
  Filled 2013-05-08: qty 20

## 2013-05-08 NOTE — Progress Notes (Signed)
Pt refused dressing change on R foot today.

## 2013-05-08 NOTE — Progress Notes (Signed)
[facility] PROGRESS NOTE  [name] [name] [id number]:[id number] DOB: Jun 23, 1946 DOA: 04/26/2013 PCP: No primary provider on file.  Assessment/Plan: Active Problems: Hypercalcemia/Hyperparathyroidism -Calcium fluctuating. Corrected calcium 12.2. Continue subcutaneous calcitonin. Hep-Lock IV fluids (given her history of cardiomyopathy/CHF) she appears edematous today given dose of lasix yesterday. Give a dose of IV pamidronate. -Elevated PTH and hypophosphatemia consistent with hyperparathyroidism,alkaline phosphatase elevated at 263 level follow further Eval as appropriate  -Dr Suanne Marker Consulted/ with endocrine/Dr. Lucianne Muss and he recommends followup with him in the office the eval and determine possibility surgical treatment of a hyperparathyroid right -he recommends for her to see him in one to 2 weeks  Proteus Mirabilis UTI (urinary tract infection) -Urine culture with Proteus Mirabilis resistant to Levaquin and patient with allergies to other meds. Patient been started on and aztreonam D7/7-10 secondary to his multiple allergies. Follow.  s/p Fall - Per social work patient does not want to give up her Medicaid check.   DM, Type 2 IDDM -Continue sliding scale insulin   Ischemic cardiomyopathy/CAD,  EF 25% Oct 2013- improved to 30% by 07/20/12 echo -As bove, with peripheral edema on exam>> S/P transmetatarsal amputation of foot      Code Status: full Family Communication: none at bedside  Disposition Plan: D/C to home after IV antibiotic course.   Consultants:  none  Procedures:  none  Antibiotics:   Levaquin started on 9/4---> 9/8 and/14  IV  Aztreonam 05/01/2013  HPI/Subjective: Denies Shortness of breath  Objective: Filed Vitals:   05/08/13 0530  BP: 112/67  Pulse: 103  Temp: 97.6 F (36.4 C)  Resp: 20    Intake/Output Summary (Last 24 hours) at 05/08/13 1418 Last data filed at 05/08/13 0900  Gross per 24 hour  Intake    325 ml  Output      0 ml  Net     325 ml   Filed Weights   05/06/13 0534 05/07/13 0500 05/08/13 0530  Weight: 84.9 kg (187 lb 2.7 oz) 88.6 kg (195 lb 5.2 oz) 85.276 kg (188 lb)    Exam:  General: alert & oriented x 3 In NAD Cardiovascular: RRR, nl S1 s2 Respiratory: CTAB Abdomen: soft +BS NT/ND, no masses palpable Extremities: No cyanosis and +1 edema, status post right forefoot amputation with heel protectors on      Data Reviewed: Basic Metabolic Panel:  Recent Labs Lab 05/04/13 0439 05/05/13 0412 05/06/13 0543 05/07/13 0455 05/08/13 0511  NA 137 136 136 135 133*  K 4.3 4.2 4.1 4.4 4.5  CL 107 106 105 106 103  CO2 25 26 23 23 22   GLUCOSE 160* 172* 149* 128* 153*  BUN 37* 39* 41* 39* 40*  CREATININE 1.12* 1.02 0.96 0.92 1.11*  CALCIUM 10.6* 10.4 10.7* 10.7* 10.8*   Liver Function Tests:  Recent Labs Lab 05/05/13 0412  AST 31  ALT 20  ALKPHOS 306*  BILITOT 0.7  PROT 5.8*  ALBUMIN 1.9*   No results found for this basename: LIPASE, AMYLASE,  in the last 168 hours No results found for this basename: AMMONIA,  in the last 168 hours CBC:  Recent Labs Lab 05/04/13 0439 05/06/13 0543  WBC 7.5 8.4  HGB 12.9 12.5  HCT 41.0 39.0  MCV 86.3 86.1  PLT 158 158   Cardiac Enzymes: No results found for this basename: CKTOTAL, CKMB, CKMBINDEX, TROPONINI,  in the last 168 hours BNP (last 3 results)  Recent Labs  07/17/12 0738 07/20/12 0520 10/15/12 2008  PROBNP 4495.0* 4108.0* 19285.0*  CBG:  Recent Labs Lab 05/07/13 1144 05/07/13 1738 05/07/13 2139 05/08/13 0732 05/08/13 1139  GLUCAP 131* 128* 154* 128* 161*    No results found for this or any previous visit (from the past 240 hour(s)).   Studies: No results found.  Scheduled Meds: . aspirin EC  81 mg Oral Daily  . aztreonam  1 g Intravenous Q8H  . calcitonin  100 Units Intramuscular Daily  . collagenase   Topical Daily  . famotidine  20 mg Oral Daily  . heparin  5,000 Units Subcutaneous Q8H  . insulin aspart  0-5 Units  Subcutaneous QHS  . insulin aspart  0-9 Units Subcutaneous TID WC  . pamidronate  60 mg Intravenous Once  . sodium chloride  3 mL Intravenous Q12H  . sodium chloride  3 mL Intravenous Q12H   Continuous Infusions:    Principal Problem:   Falling Active Problems:   LBBB (left bundle branch block)   DM, Type 2 IDDM   Ischemic cardiomyopathy, EF 25% Oct 2013- improved to 30% by 07/20/12 echo   CAD, 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 She needs CABG   Gangrene of foot, Rt foot transmetatarsal amputation 10/16/12   S/P transmetatarsal amputation of foot   Hypercalcemia   UTI (urinary tract infection)    Time spent:> 30 mins    [facility]  [facility] Pager [phone]. If 7PM-7AM, please contact night-coverage at [email or web address], password [facility] 05/08/2013, 2:18 PM  LOS: 12 days

## 2013-05-08 NOTE — Progress Notes (Signed)
Physical Therapy Discharge Patient Details Name: [name] [name] MRN: [id number] DOB: 1946-06-26 Today's Date: 05/08/2013 Time:  -     Patient discharged from PT services secondary to patient has refused 3 (three) consecutive times without medical reason.  Please see latest therapy progress note for current level of functioning and progress toward goals.    Progress and discharge plan discussed with patient and/or caregiver: Patient/Caregiver agrees with plan  GP     Tamala Ser 05/08/2013, 2:18 PM  [phone]

## 2013-05-08 NOTE — Progress Notes (Signed)
Pt woke up this morning very confused. Dr. Janee Morn aware. CT head was ordered. Pt refused to be transported to radiology, CT not done.

## 2013-05-09 LAB — BASIC METABOLIC PANEL
BUN: 42 mg/dL — ABNORMAL HIGH (ref 6–23)
CO2: 22 mEq/L (ref 19–32)
Chloride: 104 mEq/L (ref 96–112)
GFR calc non Af Amer: 54 mL/min — ABNORMAL LOW (ref >90)
Glucose, Bld: 157 mg/dL — ABNORMAL HIGH (ref 70–99)
Potassium: 4.1 mEq/L (ref 3.5–5.1)
Sodium: 134 mEq/L — ABNORMAL LOW (ref 135–145)

## 2013-05-09 LAB — GLUCOSE, CAPILLARY: Glucose-Capillary: 178 mg/dL — ABNORMAL HIGH (ref 70–99)

## 2013-05-09 MED ORDER — FUROSEMIDE 40 MG PO TABS: 40.0000 mg | ORAL_TABLET | Freq: Every day | ORAL | Status: DC

## 2013-05-09 MED ORDER — COLLAGENASE 250 UNIT/GM EX OINT: TOPICAL_OINTMENT | Freq: Every day | CUTANEOUS | Status: AC

## 2013-05-09 MED ORDER — FUROSEMIDE 10 MG/ML IJ SOLN: 40.0000 mg | Freq: Once | INTRAMUSCULAR | Status: DC

## 2013-05-09 MED ORDER — OXYCODONE HCL 5 MG PO TABS: 5.0000 mg | ORAL_TABLET | ORAL | Status: AC | PRN

## 2013-05-09 NOTE — Progress Notes (Signed)
[facility] agreed to service pt for Home Health needs. Pt is aware and continue to decline SNF.
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
## 2013-05-09 NOTE — Progress Notes (Signed)
Pt refused IV Lasix as she is getting in car to ride home, states she will take PO lasix when she get there. Pt remains very firm about what she wants and does not want. Husband at the bedside to transport pt home. Pt did agree to dsg change on LLE before DC home.

## 2013-05-10 NOTE — Progress Notes (Signed)
Patient discharged home with home health services arranged by [facility], [place]. Patient stated that her friend, Maureen Chatters would be picking her up. CSW signing off.   Unice Bailey, LCSW [facility] Clinical Social Worker cell #: [phone]

## 2013-05-12 ENCOUNTER — Encounter (HOSPITAL_COMMUNITY): Payer: Self-pay | Admitting: Emergency Medicine

## 2013-05-12 ENCOUNTER — Emergency Department (HOSPITAL_COMMUNITY): Payer: Medicare Other

## 2013-05-12 ENCOUNTER — Inpatient Hospital Stay (HOSPITAL_COMMUNITY)
Admission: EM | Admit: 2013-05-12 | Discharge: 2013-05-22 | DRG: 291 | Disposition: A | Discharge status: Other Acute Inpatient Hospital | Payer: Medicare Other | Attending: Internal Medicine | Admitting: Internal Medicine

## 2013-05-12 DIAGNOSIS — R296 Repeated falls: Secondary | ICD-10-CM

## 2013-05-12 DIAGNOSIS — J9600 Acute respiratory failure, unspecified whether with hypoxia or hypercapnia: Secondary | ICD-10-CM | POA: Diagnosis not present

## 2013-05-12 DIAGNOSIS — Z79899 Other long term (current) drug therapy: Secondary | ICD-10-CM

## 2013-05-12 DIAGNOSIS — Z9119 Patient's noncompliance with other medical treatment and regimen: Secondary | ICD-10-CM

## 2013-05-12 DIAGNOSIS — I5042 Chronic combined systolic (congestive) and diastolic (congestive) heart failure: Secondary | ICD-10-CM

## 2013-05-12 DIAGNOSIS — Z8249 Family history of ischemic heart disease and other diseases of the circulatory system: Secondary | ICD-10-CM

## 2013-05-12 DIAGNOSIS — Z880 Allergy status to penicillin: Secondary | ICD-10-CM

## 2013-05-12 DIAGNOSIS — I509 Heart failure, unspecified: Secondary | ICD-10-CM

## 2013-05-12 DIAGNOSIS — IMO0001 Reserved for inherently not codable concepts without codable children: Secondary | ICD-10-CM

## 2013-05-12 DIAGNOSIS — I2589 Other forms of chronic ischemic heart disease: Secondary | ICD-10-CM | POA: Diagnosis present

## 2013-05-12 DIAGNOSIS — I079 Rheumatic tricuspid valve disease, unspecified: Secondary | ICD-10-CM | POA: Diagnosis present

## 2013-05-12 DIAGNOSIS — I70209 Unspecified atherosclerosis of native arteries of extremities, unspecified extremity: Secondary | ICD-10-CM | POA: Diagnosis present

## 2013-05-12 DIAGNOSIS — I96 Gangrene, not elsewhere classified: Secondary | ICD-10-CM

## 2013-05-12 DIAGNOSIS — E875 Hyperkalemia: Secondary | ICD-10-CM

## 2013-05-12 DIAGNOSIS — Z602 Problems related to living alone: Secondary | ICD-10-CM

## 2013-05-12 DIAGNOSIS — K219 Gastro-esophageal reflux disease without esophagitis: Secondary | ICD-10-CM | POA: Diagnosis present

## 2013-05-12 DIAGNOSIS — N183 Chronic kidney disease, stage 3 unspecified: Secondary | ICD-10-CM

## 2013-05-12 DIAGNOSIS — Y835 Amputation of limb(s) as the cause of abnormal reaction of the patient, or of later complication, without mention of misadventure at the time of the procedure: Secondary | ICD-10-CM | POA: Diagnosis present

## 2013-05-12 DIAGNOSIS — I739 Peripheral vascular disease, unspecified: Secondary | ICD-10-CM

## 2013-05-12 DIAGNOSIS — Z882 Allergy status to sulfonamides status: Secondary | ICD-10-CM

## 2013-05-12 DIAGNOSIS — D638 Anemia in other chronic diseases classified elsewhere: Secondary | ICD-10-CM

## 2013-05-12 DIAGNOSIS — L89609 Pressure ulcer of unspecified heel, unspecified stage: Secondary | ICD-10-CM | POA: Diagnosis present

## 2013-05-12 DIAGNOSIS — S98919A Complete traumatic amputation of unspecified foot, level unspecified, initial encounter: Secondary | ICD-10-CM

## 2013-05-12 DIAGNOSIS — E876 Hypokalemia: Secondary | ICD-10-CM

## 2013-05-12 DIAGNOSIS — I2510 Atherosclerotic heart disease of native coronary artery without angina pectoris: Secondary | ICD-10-CM

## 2013-05-12 DIAGNOSIS — R680 Hypothermia, not associated with low environmental temperature: Secondary | ICD-10-CM | POA: Diagnosis present

## 2013-05-12 DIAGNOSIS — T8189XA Other complications of procedures, not elsewhere classified, initial encounter: Secondary | ICD-10-CM | POA: Diagnosis present

## 2013-05-12 DIAGNOSIS — Z794 Long term (current) use of insulin: Secondary | ICD-10-CM

## 2013-05-12 DIAGNOSIS — Z87891 Personal history of nicotine dependence: Secondary | ICD-10-CM

## 2013-05-12 DIAGNOSIS — Z89431 Acquired absence of right foot: Secondary | ICD-10-CM

## 2013-05-12 DIAGNOSIS — Z7901 Long term (current) use of anticoagulants: Secondary | ICD-10-CM

## 2013-05-12 DIAGNOSIS — I447 Left bundle-branch block, unspecified: Secondary | ICD-10-CM

## 2013-05-12 DIAGNOSIS — I255 Ischemic cardiomyopathy: Secondary | ICD-10-CM

## 2013-05-12 DIAGNOSIS — Z23 Encounter for immunization: Secondary | ICD-10-CM

## 2013-05-12 DIAGNOSIS — I743 Embolism and thrombosis of arteries of the lower extremities: Secondary | ICD-10-CM

## 2013-05-12 DIAGNOSIS — R0902 Hypoxemia: Secondary | ICD-10-CM

## 2013-05-12 DIAGNOSIS — Z881 Allergy status to other antibiotic agents status: Secondary | ICD-10-CM

## 2013-05-12 DIAGNOSIS — Z91199 Patient's noncompliance with other medical treatment and regimen due to unspecified reason: Secondary | ICD-10-CM

## 2013-05-12 DIAGNOSIS — J189 Pneumonia, unspecified organism: Secondary | ICD-10-CM

## 2013-05-12 DIAGNOSIS — I5043 Acute on chronic combined systolic (congestive) and diastolic (congestive) heart failure: Principal | ICD-10-CM

## 2013-05-12 DIAGNOSIS — R1310 Dysphagia, unspecified: Secondary | ICD-10-CM | POA: Diagnosis present

## 2013-05-12 DIAGNOSIS — N390 Urinary tract infection, site not specified: Secondary | ICD-10-CM

## 2013-05-12 DIAGNOSIS — E119 Type 2 diabetes mellitus without complications: Secondary | ICD-10-CM

## 2013-05-12 DIAGNOSIS — Z5189 Encounter for other specified aftercare: Secondary | ICD-10-CM

## 2013-05-12 DIAGNOSIS — Z9181 History of falling: Secondary | ICD-10-CM

## 2013-05-12 DIAGNOSIS — W19XXXD Unspecified fall, subsequent encounter: Secondary | ICD-10-CM

## 2013-05-12 DIAGNOSIS — Z7982 Long term (current) use of aspirin: Secondary | ICD-10-CM

## 2013-05-12 DIAGNOSIS — E210 Primary hyperparathyroidism: Secondary | ICD-10-CM | POA: Diagnosis present

## 2013-05-12 DIAGNOSIS — L8990 Pressure ulcer of unspecified site, unspecified stage: Secondary | ICD-10-CM | POA: Diagnosis present

## 2013-05-12 LAB — URINALYSIS, ROUTINE W REFLEX MICROSCOPIC
Glucose, UA: NEGATIVE mg/dL
Protein, ur: >300 mg/dL — AB
pH: 5.5 (ref 5.0–8.0)

## 2013-05-12 LAB — COMPREHENSIVE METABOLIC PANEL
AST: 32 U/L (ref 0–37)
Albumin: 2.1 g/dL — ABNORMAL LOW (ref 3.5–5.2)
BUN: 48 mg/dL — ABNORMAL HIGH (ref 6–23)
Creatinine, Ser: 0.89 mg/dL (ref 0.50–1.10)
Total Protein: 6.7 g/dL (ref 6.0–8.3)

## 2013-05-12 LAB — PRO B NATRIURETIC PEPTIDE: Pro B Natriuretic peptide (BNP): 30492 pg/mL — ABNORMAL HIGH (ref 0–125)

## 2013-05-12 LAB — CBC WITH DIFFERENTIAL/PLATELET
Basophils Absolute: 0 10*3/uL (ref 0.0–0.1)
Basophils Relative: 0 % (ref 0–1)
Eosinophils Relative: 0 % (ref 0–5)
HCT: 43.2 % (ref 36.0–46.0)
Lymphocytes Relative: 16 % (ref 12–46)
MCHC: 32.6 g/dL (ref 30.0–36.0)
Monocytes Absolute: 0.7 10*3/uL (ref 0.1–1.0)
Neutro Abs: 6.5 10*3/uL (ref 1.7–7.7)
Platelets: 229 10*3/uL (ref 150–400)
RDW: 21.2 % — ABNORMAL HIGH (ref 11.5–15.5)
WBC: 8.6 10*3/uL (ref 4.0–10.5)

## 2013-05-12 LAB — TROPONIN I
Troponin I: <0.3 ng/mL (ref <0.30)
Troponin I: <0.3 ng/mL (ref <0.30)

## 2013-05-12 LAB — MRSA PCR SCREENING: MRSA by PCR: NEGATIVE

## 2013-05-12 LAB — CG4 I-STAT (LACTIC ACID): Lactic Acid, Venous: 2.17 mmol/L (ref 0.5–2.2)

## 2013-05-12 LAB — POCT I-STAT TROPONIN I

## 2013-05-12 LAB — URINE MICROSCOPIC-ADD ON

## 2013-05-12 LAB — HEMOGLOBIN A1C: Hgb A1c MFr Bld: 7 % — ABNORMAL HIGH (ref <5.7)

## 2013-05-12 MED ORDER — VANCOMYCIN HCL IN DEXTROSE 1-5 GM/200ML-% IV SOLN
1000.0000 mg | Freq: Two times a day (BID) | INTRAVENOUS | Status: DC
  Administered 2013-05-12 – 2013-05-14 (×4): 1000 mg via INTRAVENOUS
  Filled 2013-05-12 (×5): qty 200

## 2013-05-12 MED ORDER — DEXTROSE 5 % IV SOLN
2.0000 g | Freq: Three times a day (TID) | INTRAVENOUS | Status: DC
  Administered 2013-05-12 – 2013-05-14 (×6): 2 g via INTRAVENOUS
  Filled 2013-05-12 (×7): qty 2

## 2013-05-12 MED ORDER — FUROSEMIDE 10 MG/ML IJ SOLN
40.0000 mg | Freq: Two times a day (BID) | INTRAMUSCULAR | Status: DC
  Administered 2013-05-12 – 2013-05-14 (×4): 40 mg via INTRAVENOUS
  Filled 2013-05-12 (×7): qty 4

## 2013-05-12 MED ORDER — ONDANSETRON HCL 4 MG/2ML IJ SOLN
4.0000 mg | Freq: Four times a day (QID) | INTRAMUSCULAR | Status: DC | PRN
  Administered 2013-05-15: 4 mg via INTRAVENOUS
  Filled 2013-05-12 (×2): qty 2

## 2013-05-12 MED ORDER — PRAMOXINE-ZINC OXIDE IN MO 1-12.5 % RE OINT
TOPICAL_OINTMENT | Freq: Three times a day (TID) | RECTAL | Status: DC | PRN
  Filled 2013-05-12: qty 28.3

## 2013-05-12 MED ORDER — SODIUM CHLORIDE 0.9 % IJ SOLN
3.0000 mL | Freq: Two times a day (BID) | INTRAMUSCULAR | Status: DC
  Administered 2013-05-12 – 2013-05-17 (×7): 3 mL via INTRAVENOUS

## 2013-05-12 MED ORDER — SODIUM CHLORIDE 0.9 % IJ SOLN: 3.0000 mL | INTRAMUSCULAR | Status: DC | PRN

## 2013-05-12 MED ORDER — INSULIN ASPART 100 UNIT/ML ~~LOC~~ SOLN: 0.0000 [IU] | Freq: Every day | SUBCUTANEOUS | Status: DC

## 2013-05-12 MED ORDER — PNEUMOCOCCAL VAC POLYVALENT 25 MCG/0.5ML IJ INJ
0.5000 mL | INJECTION | INTRAMUSCULAR | Status: AC
  Administered 2013-05-13: 0.5 mL via INTRAMUSCULAR
  Filled 2013-05-12 (×2): qty 0.5

## 2013-05-12 MED ORDER — LISINOPRIL 2.5 MG PO TABS
2.5000 mg | ORAL_TABLET | Freq: Every day | ORAL | Status: DC
  Administered 2013-05-12: 2.5 mg via ORAL
  Filled 2013-05-12 (×2): qty 1

## 2013-05-12 MED ORDER — FUROSEMIDE 10 MG/ML IJ SOLN
80.0000 mg | Freq: Once | INTRAMUSCULAR | Status: AC
  Administered 2013-05-12: 80 mg via INTRAVENOUS
  Filled 2013-05-12: qty 8

## 2013-05-12 MED ORDER — ACETAMINOPHEN 325 MG PO TABS
650.0000 mg | ORAL_TABLET | ORAL | Status: DC | PRN
  Administered 2013-05-14 – 2013-05-16 (×6): 650 mg via ORAL
  Filled 2013-05-12 (×6): qty 2

## 2013-05-12 MED ORDER — CALCITONIN (SALMON) 200 UNIT/ML IJ SOLN
340.0000 [IU] | Freq: Two times a day (BID) | INTRAMUSCULAR | Status: AC
  Administered 2013-05-12 – 2013-05-14 (×5): 340 [IU] via SUBCUTANEOUS
  Filled 2013-05-12 (×6): qty 1.7

## 2013-05-12 MED ORDER — SENNOSIDES-DOCUSATE SODIUM 8.6-50 MG PO TABS
2.0000 | ORAL_TABLET | Freq: Every evening | ORAL | Status: DC | PRN
  Administered 2013-05-12 – 2013-05-21 (×2): 2 via ORAL
  Filled 2013-05-12 (×2): qty 2

## 2013-05-12 MED ORDER — OXYCODONE HCL 5 MG PO TABS
5.0000 mg | ORAL_TABLET | ORAL | Status: DC | PRN
  Administered 2013-05-12: 5 mg via ORAL
  Filled 2013-05-12: qty 1

## 2013-05-12 MED ORDER — COLLAGENASE 250 UNIT/GM EX OINT
TOPICAL_OINTMENT | Freq: Every day | CUTANEOUS | Status: DC
  Administered 2013-05-12 – 2013-05-18 (×4): via TOPICAL
  Administered 2013-05-19: 1 via TOPICAL
  Administered 2013-05-22: 11:00:00 via TOPICAL
  Filled 2013-05-12: qty 30

## 2013-05-12 MED ORDER — ENOXAPARIN SODIUM 40 MG/0.4ML ~~LOC~~ SOLN
40.0000 mg | Freq: Every day | SUBCUTANEOUS | Status: DC
  Administered 2013-05-12 – 2013-05-21 (×10): 40 mg via SUBCUTANEOUS
  Filled 2013-05-12 (×13): qty 0.4

## 2013-05-12 MED ORDER — INFLUENZA VAC SPLIT QUAD 0.5 ML IM SUSP
0.5000 mL | INTRAMUSCULAR | Status: AC
  Administered 2013-05-13: 0.5 mL via INTRAMUSCULAR
  Filled 2013-05-12 (×2): qty 0.5

## 2013-05-12 MED ORDER — AZTREONAM 1 G IJ SOLR
2.0000 g | Freq: Three times a day (TID) | INTRAMUSCULAR | Status: DC
  Filled 2013-05-12 (×3): qty 2

## 2013-05-12 MED ORDER — ASPIRIN EC 81 MG PO TBEC
81.0000 mg | DELAYED_RELEASE_TABLET | Freq: Every day | ORAL | Status: DC
  Administered 2013-05-12 – 2013-05-22 (×11): 81 mg via ORAL
  Filled 2013-05-12 (×11): qty 1

## 2013-05-12 MED ORDER — SODIUM CHLORIDE 0.9 % IV BOLUS (SEPSIS)
1000.0000 mL | Freq: Once | INTRAVENOUS | Status: DC
  Administered 2013-05-12: 1000 mL via INTRAVENOUS

## 2013-05-12 MED ORDER — INSULIN ASPART 100 UNIT/ML ~~LOC~~ SOLN
0.0000 [IU] | Freq: Three times a day (TID) | SUBCUTANEOUS | Status: DC
  Administered 2013-05-13: 2 [IU] via SUBCUTANEOUS

## 2013-05-12 MED ORDER — SODIUM CHLORIDE 0.9 % IV SOLN
250.0000 mL | INTRAVENOUS | Status: DC | PRN
  Administered 2013-05-12: 250 mL via INTRAVENOUS

## 2013-05-12 NOTE — ED Notes (Signed)
Bed: WA16 Expected date:  Expected time:  Means of arrival:  Comments: EMS fall 

## 2013-05-12 NOTE — H&P (Signed)
[facility] History and Physical  [name] [name] MVH:[id number] DOB: October 18, 1945 DOA: 05/12/2013   PCP: No primary provider on file.   Chief Complaint: sob and unable to get up after fall  HPI:  67 year old female with a history of ischemic dilated cardiomyopathy, right popliteal artery occlusion, and DVT previously treated with Coumadin, diabetes mellitus, hyperparathyroidism who presented to the emergency department after a fall at home. According to the patient, there was no syncope. The patient laid on her floor for 2 days unable to get up until she was found by her neighbor today. The patient was recently discharged from [facility] on 05/09/2013 after a 2 week stay for UTI and recurrent falls. The patient has not taken any of her medications and so she was discharged from the hospital including her furosemide, indoor, and aspirin.. She complains of some chest discomfort with shortness of breath. The patient has had some subjective fevers and chills. There is no history of nausea, vomiting, diarrhea, abdominal pain, headaches. Apparently, the patient has been very resistant to placement in a skilled nursing facility against the wishes of her daughter. The patient's daughter at the bedside supplements the history. She states that the patient is essentially bedbound for the past year.  In ED, the patient was found to be fluid overloaded with chest x-ray suggesting left pleural effusion as well as proBNP 30,492. The patient was noted to be hypoxemic at the time of presentation 75% on room air. The patient improved to the mid 90s on 2 L. The patient was given furosemide 80 mg IV x1. BMP was unremarkable. CBC was also unremarkable. The patient's temperature was 92.9. Urinalysis did not suggest UTI. CPK was 116, and iSTAT troponin was negative. CT of the brain did not show any acute infarction, but showed diffuse swelling of the scalp as well as her face.  Assessment/Plan: Acute on chronic systolic  CHF -Secondary to noncompliance with medications -Start furosemide 40 mg IV every 12 hours -Daily weights -Strict I.'s and Os -Echocardiogram 10/17/2012 shows EF 20-25% -Patient had heart catheterization in Nov. 2013 which showed Severe Ischemic Cardiomyopathy with 100% mid occlusion of the RCA and subtotal occlusion of both the circumflex and the LAD with 95+ % lesions with multiple other significant lesions in his vessels Primary hyperparathyroidism with hypercalcemia -Start calcitonin -Continue furosemide -Patient has missed an outpatient endocrine appt (Dr. Lucianne Muss) -Part of the patient's hypercalcemia is also related to her immobility from her recent fall. -Ionized calcium Diabetes mellitus type 2 -NovoLog sliding scale -Check hemoglobin A1c -10/17/2012 hemoglobin A1c 8.8 Chest discomfort -Cycle troponins -EKG shows unchanged left bundle branch block -Although the patient has a history of DVTs, the patient's clinical presentation is more consistent with decompensated CHF at this time with massive anasarca and increasing pleural effusion Hypothermia -Urinalysis did not suggest UTI -Blood cultures x2 sets -Empiric vancomycin and aztreonam -Place patient on warming blanket -Check morning cortisol -TSH 3.014 on 04/27/13 Recurrent falls -likely due to deconditioning -Patient's daughter states that the patient has been essentially bedbound for the past year with extreme weakness, but she has been resistant to going to skilled nursing facility -PT evaluation Right lower extremity wound with right popliteal occlusion -wound care nurse consult      Past Medical History  Diagnosis Date  . 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 DVT, bilateral lower limbs 05/2012  . 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 GERD (  gastroesophageal reflux disease)   . History of blood transfusion   . Coronary artery  disease     Severe Multivessel Disase; 3 V Disease wtih LAD distribution Viability &  inferior scar.  . Numbness in feet 06/29/12    Right worse than left  . DVT (deep venous thrombosis) 06/29/12  . CHF (congestive heart failure)   . Venous embolism and thrombosis 01/09/13  . Diabetes mellitus type 2, uncontrolled, with complications 01/09/13    Very poorly controlled  . Coronary atherosclerosis of unspecified type of vessel, native or graft   . Blood transfusion, without reported diagnosis 01/09/13  . 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 Vaginal hysterectomy  ~ 1980  . Cesarean section  1979  . Lumbar disc surgery  1980's  . Facial reconstruction surgery    . Cardiac catheterization    . Amputation Right 10/16/2012    Procedure: AMPUTATION FOOT;  Surgeon: Kathryne Hitch, MD;  Location: [facility]S;  Service: Orthopedics;  Laterality: Right;  transmetatarsal amp  . I&d extremity Right 01/05/2013    Procedure: I & D Right foot, Revision Transmet Amputation;  Surgeon: Kathryne Hitch, MD;  Location: [facility];  Service: Orthopedics;  Laterality: Right;  . Spine surgery  1980's    Lumbar disc   Social History:  reports that she has quit smoking. Her smoking use included Cigarettes. She has a 10 pack-year smoking history. She has never used smokeless tobacco. She reports that she does not drink alcohol or use illicit drugs.   Family History  Problem Relation Age of Onset  . Cancer Mother   . CAD Mother   . Heart disease Mother     Heart Disease before age 44  . Hyperlipidemia Mother   . Hypertension Mother   . Heart attack Mother   . Cancer Father   . CAD Father      Allergies  Allergen Reactions  . Penicillins Anaphylaxis  . Sulfa Antibiotics Anaphylaxis  . Levofloxacin Itching      Prior to Admission medications   Medication Sig Start Date End Date Taking? Authorizing Provider  aspirin EC 81 MG EC tablet Take 1  tablet (81 mg total) by mouth daily. 07/09/12  Yes Luke K Kilroy, PA-C  collagenase (SANTYL) ointment Apply topically daily. Apply to right foot surgical site.  Apply 1/8 inch thickness to slough in wound bed for debridement. Top with NS moist dressing, dry gauze and wrap gauze (kerlix).  Secure with tape. Use for 2 weeks. 05/09/13  Yes Rodolph Bong, MD  furosemide (LASIX) 40 MG tablet Take 1 tablet (40 mg total) by mouth daily. 05/09/13  Yes Rodolph Bong, MD  isosorbide mononitrate (IMDUR) 30 MG 24 hr tablet Take 30 mg by mouth daily as needed (for weakness).   Yes Historical Provider, MD  metFORMIN (GLUCOPHAGE) 500 MG tablet Take 500 mg by mouth 2 (two) times daily with a meal.   Yes Historical Provider, MD  Multiple Vitamin (MULTIVITAMIN) LIQD Take 5 mLs by mouth daily.   Yes Historical Provider, MD  oxyCODONE (OXY IR/ROXICODONE) 5 MG immediate release tablet Take 1 tablet (5 mg total) by mouth every 4 (four) hours as needed for pain. 05/09/13  Yes Rodolph Bong, MD    Review of Systems:  Constitutional:  No weight loss, night sweats,  Head&Eyes: No headache.  No vision loss.   ENT:  No Difficulty swallowing,Tooth/dental  problems,Sore throat,   Cardio-vascular:  No chest pain,  dizziness, palpitations  GI:  No  abdominal pain, nausea, vomiting, diarrhea, loss of appetite, hematochezia,  Resp:  No shortness of breath with exertion or at rest. No cough. No coughing up of blood  Skin:  no rash or lesions.  GU:  no dysuria, change in color of urine, no urgency or frequency. No flank pain.  Musculoskeletal:  No joint pain or swelling. No decreased range of motion. No back pain.  Psych:  No change in mood or affect. No depression or anxiety. Neurologic: No headache, no dysesthesia, no focal weakness, no vision loss. No syncope  Physical Exam: Filed Vitals:   05/12/13 1053 05/12/13 1453  BP: 113/67 115/68  Pulse: 85 84  Temp: 92.9 F (33.8 C) 94 F (34.4 C)  TempSrc:  Rectal Rectal  Resp: 23 23  SpO2: 75% 92%   General:  A&O x 2, NAD, nontoxic, pleasant/cooperative Head/Eye: No conjunctival hemorrhage, no icterus, [place]/AT, No nystagmus ENT:  No icterus,  No thrush, good dentition, no pharyngeal exudate Neck:  +thyromegaly, no lymphadenpathy, no bruits; +JVD CV:  RRR, no rub, no gallop, no S3 Lung:  Bibasilar crackles. No wheezing. Good air movement. 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 without any necrosis, pus, lymphangitis, crepitance, odor   Labs on Admission:  Basic Metabolic Panel:  Recent Labs Lab 05/06/13 0543 05/07/13 0455 05/08/13 0511 05/09/13 0343 05/12/13 1055  NA 136 135 133* 134* 137  K 4.1 4.4 4.5 4.1 4.4  CL 105 106 103 104 106  CO2 23 23 22 22 19   GLUCOSE 149* 128* 153* 157* 87  BUN 41* 39* 40* 42* 48*  CREATININE 0.96 0.92 1.11* 1.05 0.89  CALCIUM 10.7* 10.7* 10.8* 10.8* 11.4*   Liver Function Tests:  Recent Labs Lab 05/12/13 1055  AST 32  ALT 29  ALKPHOS 257*  BILITOT 1.1  PROT 6.7  ALBUMIN 2.1*   No results found for this basename: LIPASE, AMYLASE,  in the last 168 hours No results found for this basename: AMMONIA,  in the last 168 hours CBC:  Recent Labs Lab 05/06/13 0543 05/12/13 1055  WBC 8.4 8.6  NEUTROABS  --  6.5  HGB 12.5 14.1  HCT 39.0 43.2  MCV 86.1 83.6  PLT 158 229   Cardiac Enzymes:  Recent Labs Lab 05/12/13 1055  CKTOTAL 116   BNP: No components found with this basename: POCBNP,  CBG:  Recent Labs Lab 05/08/13 1139 05/08/13 1735 05/08/13 2112 05/09/13 0757 05/09/13 1136  GLUCAP 161* 151* 141* 165* 178*    Radiological Exams on Admission: Dg Chest 2 View  05/12/2013   CLINICAL DATA:  Shortness of breath, pain, fall  EXAM: CHEST  2 VIEW  COMPARISON:  04/27/2013  FINDINGS: Cardiomegaly again noted. Stable linear scarring right midlung. There is small left pleural effusion  with left lower lobe atelectasis or infiltrate.  IMPRESSION: Small left pleural effusion with left lower lobe atelectasis or infiltrate. Stable scarring or atelectasis in right midlung.   Electronically Signed   By: Natasha Mead   On: 05/12/2013 12:09   Ct Head Wo Contrast  05/12/2013   CLINICAL DATA:  Fall, LOC  EXAM: CT HEAD WITHOUT CONTRAST  TECHNIQUE: Contiguous axial images were obtained from the base of the skull through the vertex without intravenous contrast.  COMPARISON:  04/04/2008  FINDINGS: There is skull swelling and subcutaneous stranding  in left parietal region. There is soft tissue swelling and subcutaneous stranding left temporal region and left periorbital preseptal region. Left face/zygomatic region soft tissue swelling and subcutaneous stranding.  No intracranial hemorrhage, mass effect or midline shift. No paranasal sinuses air-fluid levels. No intraventricular hemorrhage. Mild cerebral atrophy. Mild periventricular white matter decreased attenuation probable due to chronic small vessel ischemic changes.  IMPRESSION: 1. There is scalp swelling and subcutaneous stranding left parietal region left temporal region left face and left periorbital. 2. No skull fracture. No intracranial hemorrhage, mass effect or midline shift. 3. Mild cerebral atrophy. Mild periventricular white matter decreased attenuation probable due to chronic small vessel ischemic changes.   Electronically Signed   By: Natasha Mead   On: 05/12/2013 12:45    EKG: Independently reviewed. Sinus, LBBB    Time spent:70 minutes Code Status:   FULL Family Communication:   Daughter at bedside   Mohamad Bruso, DO  [facility] Pager [phone]  If 7PM-7AM, please contact night-coverage [email or web address] Password [facility] 05/12/2013, 2:57 PM

## 2013-05-12 NOTE — Progress Notes (Signed)
UR completed 

## 2013-05-12 NOTE — Progress Notes (Signed)
[facility] ED CM noted CM consult. CM spoke with Admission RN, [name] [name] who states pt informed her [facility] was called to see pt but [name] [name] was never seen related to pt not being able to open door, concern voiced that pt is not taking her medications.  Pt discussed with Admission nurse she is ready for short term rehab  CM spoke with the pt and her daughter at the bedside CM reviewed in details medicare guidelines, home health [facility]) (length of stay in home, types of [facility] staff available, coverage, primary caregiver, up to 24 hrs before services may be started), Private duty nursing (PDN-coverage, length of stay in the home types of staff available), assisted living (ASL- coverage, services offered) nd Skilled nursing facilities (snf- coverage and services offered)   CM provided pt with a list of [place] home health agencies, PDN, and snfs and offered the daughter copies but the pt stated "she does not need any just me"    Discussed pt to be further evaluated by unit therapists (PT/OT) for recommendation of level of care and share this with attending MD and unit CM  The pt states she inquired about who pays for each type of coverage again the Daughter states the pt is confused at intervals States pt had previous been in [facility].   Daughter was offered copies of home health, PDN and snf lists prior to leaving [facility] ED

## 2013-05-12 NOTE — Progress Notes (Signed)
ANTIBIOTIC CONSULT NOTE - INITIAL  Pharmacy Consult for Vancomycin Indication: rule out pneumonia  Allergies  Allergen Reactions  . Penicillins Anaphylaxis  . Sulfa Antibiotics Anaphylaxis  . Levofloxacin Itching    Patient Measurements:    Vital Signs: Temp: 94 F (34.4 C) (09/19 1453) Temp src: Rectal (09/19 1453) BP: 115/68 mmHg (09/19 1453) Pulse Rate: 84 (09/19 1453) Intake/Output from previous day:   Intake/Output from this shift:    Labs:  Recent Labs  05/12/13 1055  WBC 8.6  HGB 14.1  PLT 229  CREATININE 0.89   The CrCl is unknown because both a height and weight (above a minimum accepted value) are required for this calculation. No results found for this basename: Rolm Gala, VANCORANDOM, GENTTROUGH, GENTPEAK, GENTRANDOM, TOBRATROUGH, TOBRAPEAK, TOBRARND, AMIKACINPEAK, AMIKACINTROU, AMIKACIN,  in the last 72 hours   Microbiology: Recent Results (from the past 720 hour(s))  URINE CULTURE     Status: None   Collection Time    04/27/13 11:34 PM      Result Value Range Status   Specimen Description URINE, CLEAN CATCH   Final   Special Requests NONE   Final   Culture  Setup Time     Final   Value: 04/28/2013 05:41     Performed at [facility] Count     Final   Value: >=100,000 COLONIES/ML     Performed at [facility]   Culture     Final   Value: PROTEUS MIRABILIS     Performed at [facility]   Report Status 05/01/2013 FINAL   Final   Organism ID, Bacteria PROTEUS MIRABILIS   Final    Medical History: Past Medical History  Diagnosis Date  . 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 DVT, bilateral lower limbs 05/2012  . 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 GERD (gastroesophageal reflux disease)   . History of blood transfusion   . Coronary artery disease     Severe Multivessel Disase; 3 V Disease wtih LAD  distribution Viability &  inferior scar.  . Numbness in feet 06/29/12    Right worse than left  . DVT (deep venous thrombosis) 06/29/12  . CHF (congestive heart failure)   . Venous embolism and thrombosis 01/09/13  . Diabetes mellitus type 2, uncontrolled, with complications 01/09/13    Very poorly controlled  . Coronary atherosclerosis of unspecified type of vessel, native or graft   . Blood transfusion, without reported diagnosis 01/09/13  . Gangrene of foot 10/16/12    Right      Assessment: 50 yoF presents to ED following fall at home for which she may have been lying at home on floor for a few days before neighbor found her.  Patient is hypothermic and tachypneic with recent discharge from hospital 3 days ago due to multiple falls.  Will be admitted to ICU and will start antibiotics for possible HCAP with vancomycin and aztreonam (pt has allergy to penicillin).  SCr 0.89, CrCl~70 ml/min  Weight 86 kg  WBC 8.6, Rectal temperature 92.9 this morning  Blood cultures sent.  Goal of Therapy:  Vancomycin trough level 15-20 mcg/ml  Plan:  Vancomycin 1g IV q12h. F/u SCr, trough levels, and clinical course.  Clance Boll 05/12/2013,4:38 PM

## 2013-05-12 NOTE — ED Notes (Signed)
Pt's eyes swollen shut, very edematous in extremities, pt very dry

## 2013-05-12 NOTE — ED Notes (Signed)
[facility]: from home, fell two days and has been there ever since. BG 62

## 2013-05-12 NOTE — ED Provider Notes (Signed)
CSN: [id number]     Arrival date & time 05/12/13  1024 History   First MD Initiated Contact with Patient 05/12/13 1032     Chief Complaint  Patient presents with  . Fall   (Consider location/radiation/quality/duration/timing/severity/associated sxs/prior Treatment) Patient is a 67 y.o. female presenting with fall. The history is provided by the patient.  Fall This is a recurrent problem. Episode onset: thinks she fell 2 days ago adn has been lying on her floor till neighbor found her today. The problem occurs constantly. The problem has not changed since onset.Associated symptoms include chest pain and shortness of breath. Pertinent negatives include no abdominal pain. Associated symptoms comments: Productive cough.  Generalized body pain.  States she remembered falling but then thinks she passed out.. Nothing aggravates the symptoms. Nothing relieves the symptoms. She has tried nothing for the symptoms. The treatment provided no relief.    Past Medical History  Diagnosis Date  . 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 DVT, bilateral lower limbs 05/2012  . 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 GERD (gastroesophageal reflux disease)   . History of blood transfusion   . Coronary artery disease     Severe Multivessel Disase; 3 V Disease wtih LAD distribution Viability &  inferior scar.  . Numbness in feet 06/29/12    Right worse than left  . DVT (deep venous thrombosis) 06/29/12  . CHF (congestive heart failure)   . Venous embolism and thrombosis 01/09/13  . Diabetes mellitus type 2, uncontrolled, with complications 01/09/13    Very poorly controlled  . Coronary atherosclerosis of unspecified type of vessel, native or graft   . Blood transfusion, without reported diagnosis 01/09/13  . 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 Vaginal hysterectomy  ~ 1980  .  Cesarean section  1979  . Lumbar disc surgery  1980's  . Facial reconstruction surgery    . Cardiac catheterization    . Amputation Right 10/16/2012    Procedure: AMPUTATION FOOT;  Surgeon: Kathryne Hitch, MD;  Location: [facility];  Service: Orthopedics;  Laterality: Right;  transmetatarsal amp  . I&d extremity Right 01/05/2013    Procedure: I & D Right foot, Revision Transmet Amputation;  Surgeon: Kathryne Hitch, MD;  Location: [facility];  Service: Orthopedics;  Laterality: Right;  . Spine surgery  1980's    Lumbar disc   Family History  Problem Relation Age of Onset  . Cancer Mother   . CAD Mother   . Heart disease Mother     Heart Disease before age 78  . Hyperlipidemia Mother   . Hypertension Mother   . Heart attack Mother   . Cancer Father   . CAD Father    History  Substance Use Topics  . Smoking status: Former Smoker -- 0.50 packs/day for 20 years    Types: Cigarettes  . Smokeless tobacco: Never Used     Comment: 07/06/12 "stopped smoking cigarettes years ago"  . Alcohol Use: No   OB History   Grav Para Term Preterm Abortions TAB SAB Ect Mult Living                 Review of Systems  Respiratory: Positive for cough and shortness of breath.        Productive cough  Cardiovascular: Positive for chest pain.  Gastrointestinal: Negative  for abdominal pain.  Musculoskeletal:       Recent amputation of the right foot with open wound  All other systems reviewed and are negative.    Allergies  Penicillins; Sulfa antibiotics; and Levofloxacin  Home Medications   Current Outpatient Rx  Name  Route  Sig  Dispense  Refill  . aspirin EC 81 MG EC tablet   Oral   Take 1 tablet (81 mg total) by mouth daily.         . collagenase (SANTYL) ointment   Topical   Apply topically daily. Apply to right foot surgical site.  Apply 1/8 inch thickness to slough in wound bed for debridement. Top with NS moist dressing, dry gauze and wrap gauze (kerlix).  Secure with  tape. Use for 2 weeks.   90 g   0   . furosemide (LASIX) 40 MG tablet   Oral   Take 1 tablet (40 mg total) by mouth daily.   30 tablet   4   . isosorbide mononitrate (IMDUR) 30 MG 24 hr tablet   Oral   Take 30 mg by mouth daily as needed (for weakness).         . metFORMIN (GLUCOPHAGE) 500 MG tablet   Oral   Take 500 mg by mouth 2 (two) times daily with a meal.         . Multiple Vitamin (MULTIVITAMIN) LIQD   Oral   Take 5 mLs by mouth daily.         Marland Kitchen oxyCODONE (OXY IR/ROXICODONE) 5 MG immediate release tablet   Oral   Take 1 tablet (5 mg total) by mouth every 4 (four) hours as needed for pain.   20 tablet   0    BP 113/67  Pulse 85  Temp(Src) 92.9 F (33.8 C) (Rectal)  Resp 23  SpO2 75% Physical Exam  Nursing note and vitals reviewed. Constitutional: She is oriented to person, place, and time. She appears well-developed and well-nourished. No distress.  HENT:  Head: Normocephalic and atraumatic.  Mouth/Throat: Oropharynx is clear and moist.  Eyes: Conjunctivae and EOM are normal. Pupils are equal, round, and reactive to light.  Periorbital edema bilaterally without ecchymosis  Neck: Normal range of motion. Neck supple.  Cardiovascular: Normal rate, regular rhythm and intact distal pulses.   No murmur heard. Pulmonary/Chest: Tachypnea noted. No respiratory distress. She has wheezes. She has rhonchi. She has no rales.  Abdominal: Soft. She exhibits no distension. There is no tenderness. There is no rebound and no guarding.  Musculoskeletal: Normal range of motion. She exhibits tenderness. She exhibits no edema.  Partial foot amputation with small open wound distally.  Pressures sores over the lateral left leg and abrasions to bilateral knees.  Full ROM of bilateral hips and knees.  No signs of back wounds or injury  Neurological: She is alert and oriented to person, place, and time.  Skin: Skin is warm and dry. No rash noted. No erythema.  Psychiatric: She  has a normal mood and affect. Her behavior is normal.    ED Course  Procedures (including critical care time) Labs Review Labs Reviewed  CBC WITH DIFFERENTIAL - Abnormal; Notable for the following:    RBC 5.17 (*)    RDW 21.2 (*)    All other components within normal limits  COMPREHENSIVE METABOLIC PANEL - Abnormal; Notable for the following:    BUN 48 (*)    Calcium 11.4 (*)    Albumin 2.1 (*)  Alkaline Phosphatase 257 (*)    GFR calc non Af Amer 66 (*)    GFR calc Af Amer 77 (*)    All other components within normal limits  URINALYSIS, ROUTINE W REFLEX MICROSCOPIC - Abnormal; Notable for the following:    Color, Urine AMBER (*)    APPearance CLOUDY (*)    Hgb urine dipstick TRACE (*)    Bilirubin Urine SMALL (*)    Protein, ur >300 (*)    Leukocytes, UA SMALL (*)    All other components within normal limits  PRO B NATRIURETIC PEPTIDE - Abnormal; Notable for the following:    Pro B Natriuretic peptide (BNP) 30492.0 (*)    All other components within normal limits  URINE MICROSCOPIC-ADD ON - Abnormal; Notable for the following:    Bacteria, UA FEW (*)    All other components within normal limits  URINE CULTURE  CK  CG4 I-STAT (LACTIC ACID)  POCT I-STAT TROPONIN I   Imaging Review No results found.   Date: 05/12/2013  Rate: 70  Rhythm: normal sinus rhythm  QRS Axis: normal  Intervals: normal  ST/T Wave abnormalities: nonspecific ST/T changes  Conduction Disutrbances:left bundle branch block  Narrative Interpretation:   Old EKG Reviewed: unchanged   MDM  No diagnosis found.  Patient presented by EMS after a fall at home for an unknown amount of time. She thinks she fell a few days ago and had been lying at home on the floor until her neighbor found her today. She has areas of pressure sores on her lower legs where she was lying on the floor as well as swollen eyelids but no specific head trauma. She does not know if she had syncope or mechanical fall but she  does remember colon. Upon arrival here patient was having 75% on room air tachypnea and hypothermic at 92.9 rectally. Heart rate and blood pressure is normal. Patient is complaining of respiratory symptoms at this time and some rhonchi on exam. She is alert and oriented. Concern for dehydration, rhabdomyolysis, electrolyte abnormalities, malnutrition , HCAP vs chf.  Low suspicion for fractured bones at this time.  Patient just recently discharged from the hospital 3 days ago due to multiple falls.  Oxygen improved to greater than 90% on 2 L  Pt given gentle IV fluids due to dilated cardiomyopathy.  Head CT, chest x-ray, CBC, CMP, CK, UA, troponin, lactic acid pending  12:06 PM Pt's labs consistent with CHF and CXR with evidence of pleural effusion which is most likely causing hypoxia.  No elevation of WBC or lactate to suggestinfection.  Also pt has not taken lasix.  Will give IV lasix and admit.  Gwyneth Sprout, MD 05/17/13 1754

## 2013-05-13 DIAGNOSIS — Z9119 Patient's noncompliance with other medical treatment and regimen: Secondary | ICD-10-CM

## 2013-05-13 DIAGNOSIS — M7989 Other specified soft tissue disorders: Secondary | ICD-10-CM

## 2013-05-13 DIAGNOSIS — M79609 Pain in unspecified limb: Secondary | ICD-10-CM

## 2013-05-13 DIAGNOSIS — E119 Type 2 diabetes mellitus without complications: Secondary | ICD-10-CM

## 2013-05-13 LAB — BASIC METABOLIC PANEL
BUN: 49 mg/dL — ABNORMAL HIGH (ref 6–23)
CO2: 21 mEq/L (ref 19–32)
Calcium: 10.6 mg/dL — ABNORMAL HIGH (ref 8.4–10.5)
GFR calc non Af Amer: 63 mL/min — ABNORMAL LOW (ref >90)
Glucose, Bld: 125 mg/dL — ABNORMAL HIGH (ref 70–99)

## 2013-05-13 LAB — URINE CULTURE: Colony Count: 6000

## 2013-05-13 LAB — CBC
Hemoglobin: 12.5 g/dL (ref 12.0–15.0)
MCH: 27.4 pg (ref 26.0–34.0)
MCHC: 32.4 g/dL (ref 30.0–36.0)
MCV: 84.6 fL (ref 78.0–100.0)
Platelets: 209 10*3/uL (ref 150–400)
RBC: 4.56 MIL/uL (ref 3.87–5.11)

## 2013-05-13 LAB — GLUCOSE, CAPILLARY
Glucose-Capillary: 96 mg/dL (ref 70–99)
Glucose-Capillary: 101 mg/dL — ABNORMAL HIGH (ref 70–99)

## 2013-05-13 MED ORDER — INSULIN ASPART 100 UNIT/ML ~~LOC~~ SOLN
0.0000 [IU] | Freq: Three times a day (TID) | SUBCUTANEOUS | Status: DC
  Administered 2013-05-13 – 2013-05-14 (×2): 2 [IU] via SUBCUTANEOUS
  Administered 2013-05-14 – 2013-05-16 (×3): 1 [IU] via SUBCUTANEOUS
  Administered 2013-05-18: 2 [IU] via SUBCUTANEOUS
  Administered 2013-05-19: 1 [IU] via SUBCUTANEOUS
  Administered 2013-05-19: 2 [IU] via SUBCUTANEOUS
  Administered 2013-05-19 – 2013-05-20 (×2): 1 [IU] via SUBCUTANEOUS
  Administered 2013-05-20: 2 [IU] via SUBCUTANEOUS
  Administered 2013-05-20: 3 [IU] via SUBCUTANEOUS
  Administered 2013-05-21 (×3): 2 [IU] via SUBCUTANEOUS

## 2013-05-13 MED ORDER — RANOLAZINE ER 500 MG PO TB12
500.0000 mg | ORAL_TABLET | Freq: Two times a day (BID) | ORAL | Status: DC
  Administered 2013-05-13 – 2013-05-22 (×18): 500 mg via ORAL
  Filled 2013-05-13 (×21): qty 1

## 2013-05-13 MED ORDER — DIGOXIN 0.0625 MG HALF TABLET
0.0625 mg | ORAL_TABLET | Freq: Every day | ORAL | Status: DC
  Administered 2013-05-13 – 2013-05-22 (×10): 0.0625 mg via ORAL
  Filled 2013-05-13 (×10): qty 1

## 2013-05-13 NOTE — Progress Notes (Signed)
VASCULAR LAB PRELIMINARY  PRELIMINARY  PRELIMINARY  PRELIMINARY  Bilateral lower extremity venous Dopplers completed.    Preliminary report:  There is no DVT or SVT noted in the bilateral lower extremities.  [name] [name], RVT 05/13/2013, 12:04 PM

## 2013-05-13 NOTE — Progress Notes (Signed)
  Echocardiogram 2D Echocardiogram has been performed.  Arvil Chaco 05/13/2013, 9:57 AM

## 2013-05-13 NOTE — Evaluation (Signed)
Physical Therapy Evaluation Patient Details Name: [name] [name] MRN: [id number] DOB: March 14, 1946 Today's Date: 05/13/2013 Time: 1914-7829 PT Time Calculation (min): 10 min  PT Assessment / Plan / Recommendation History of Present Illness  67 year old female with a history of ischemic dilated cardiomyopathy, right popliteal artery occlusion, and DVT previously treated with Coumadin, diabetes mellitus, hyperparathyroidism who presented to the emergency department after a fall at home. According to the patient, there was no syncope. The patient laid on her floor for 2 days unable to get up until she was found by her neighbor. Pt with hx of R transmet amputation feb. 2014  Clinical Impression  Pt is very lethargic and unable to participate with PT today. Anticipate her recovery may be slow and she will need continued PT at SNF to regain functional independence    PT Assessment  Patient needs continued PT services    Follow Up Recommendations  SNF    Does the patient have the potential to tolerate intense rehabilitation      Barriers to Discharge Decreased caregiver support      Equipment Recommendations  None recommended by PT    Recommendations for Other Services     Frequency Min 3X/week    Precautions / Restrictions Restrictions RLE Weight Bearing: Touchdown weight bearing Other Position/Activity Restrictions: TDWB indicated from chart last admission   Pertinent Vitals/Pain Pt expresses pain with movement of RLE      Mobility  Bed Mobility Bed Mobility: Not assessed Transfers Transfers: Not assessed Ambulation/Gait Ambulation/Gait Assistance: Not tested (comment)    Exercises General Exercises - Lower Extremity Hip ABduction/ADduction: PROM;Both;5 reps;Supine Hip Flexion/Marching: PROM;Both;5 reps;Supine Shoulder Exercises Shoulder Flexion: PROM;Both;5 reps;Supine   PT Diagnosis: Generalized weakness  PT Problem List: Decreased strength;Decreased activity  tolerance;Decreased mobility;Decreased cognition;Decreased safety awareness PT Treatment Interventions: Functional mobility training;DME instruction;Gait training;Therapeutic activities;Therapeutic exercise;Patient/family education;Wheelchair mobility training     PT Goals(Current goals can be found in the care plan section) Acute Rehab PT Goals Patient Stated Goal: pt unable to state PT Goal Formulation: Patient unable to participate in goal setting Time For Goal Achievement: 05/27/13 Potential to Achieve Goals: Fair  Visit Information  Last PT Received On: 05/13/13 Assistance Needed: +2 History of Present Illness: 67 year old female with a history of ischemic dilated cardiomyopathy, right popliteal artery occlusion, and DVT previously treated with Coumadin, diabetes mellitus, hyperparathyroidism who presented to the emergency department after a fall at home. According to the patient, there was no syncope. The patient laid on her floor for 2 days unable to get up until she was found by her neighbor. Pt with hx of R transmet amputation feb. 2014       Prior Functioning  Home Living Family/patient expects to be discharged to:: Private residence Living Arrangements: Alone Type of Home: House Home Access: Stairs to enter [employer] of Steps: 2, plans to have ramp build Home Equipment: Wheelchair - [employer] - 2 wheels (transfer board) Additional Comments: was at [employer] until 04/18/13 then went home with [facility]; sponge bathed at home with assist from [facility] aide (infomation from chart last admission) Prior Function Comments: used WC and walked short distances without assistive device, worked on Hydrologist transfers with [employer] Communication Communication: Other (comment) (pt lethargic, did not speak)    Cognition  Cognition Arousal/Alertness: Lethargic (attempts to open eyes)    Extremity/Trunk Assessment Lower Extremity Assessment Lower  Extremity Assessment: Generalized weakness;RLE deficits/detail;LLE deficits/detail RLE Deficits / Details: transmet amp wrapped in bulky dressing.  PT  writhes in pain with ROM of LLE.  Pt did not actively assist with ROM RLE: Unable to fully assess due to pain LLE Deficits / Details: pt with edema in foot and leg. Did not actively assist with ROM   Balance Balance Balance Assessed: No  End of Session PT - End of Session Equipment Utilized During Treatment: Oxygen Activity Tolerance: Patient limited by lethargy Patient left: in bed Nurse Communication: Mobility status  GP    Bayard Hugger. [place], [place] [phone] 05/13/2013, 12:16 PM

## 2013-05-13 NOTE — Evaluation (Signed)
Clinical/Bedside Swallow Evaluation Patient Details  Name: [name] [name] MRN: [id number] Date of Birth: 1946/05/18  Today's Date: 05/13/2013 Time: 4098-1191 SLP Time Calculation (min): 24 min  Past Medical History:  Past Medical History  Diagnosis Date  . 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 DVT, bilateral lower limbs 05/2012  . 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 GERD (gastroesophageal reflux disease)   . History of blood transfusion   . Coronary artery disease     Severe Multivessel Disase; 3 V Disease wtih LAD distribution Viability &  inferior scar.  . Numbness in feet 06/29/12    Right worse than left  . DVT (deep venous thrombosis) 06/29/12  . CHF (congestive heart failure)   . Venous embolism and thrombosis 01/09/13  . Diabetes mellitus type 2, uncontrolled, with complications 01/09/13    Very poorly controlled  . Coronary atherosclerosis of unspecified type of vessel, native or graft   . Blood transfusion, without reported diagnosis 01/09/13  . Gangrene of foot 10/16/12    Right    Past Surgical History:  Past Surgical History  Procedure Laterality Date  . Vaginal hysterectomy  ~ 1980  . Cesarean section  1979  . Lumbar disc surgery  1980's  . Facial reconstruction surgery    . Cardiac catheterization    . Amputation Right 10/16/2012    Procedure: AMPUTATION FOOT;  Surgeon: Kathryne Hitch, MD;  Location: [facility];  Service: Orthopedics;  Laterality: Right;  transmetatarsal amp  . I&d extremity Right 01/05/2013    Procedure: I & D Right foot, Revision Transmet Amputation;  Surgeon: Kathryne Hitch, MD;  Location: [facility];  Service: Orthopedics;  Laterality: Right;  . Spine surgery  1980's    Lumbar disc   HPI:  67 year old female with a history of ischemic dilated cardiomyopathy, right popliteal artery occlusion, and DVT previously treated with Coumadin, diabetes  mellitus, hyperparathyroidism who presented to the emergency department after a fall at home. According to the patient, there was no syncope. The patient laid on her floor for 2 days unable to get up until she was found by her neighbor today. The patient was recently discharged [facility] on [facility]/09[facility] after a 2 week stay for UTI and recurrent falls. The patient has not taken any of her medications and so she was discharged from the hospital including her furosemide, indoor, and aspirin.. She complains of some chest discomfort with shortness of breath. The patient has had some subjective fevers and chills. There is no history of nausea, vomiting, diarrhea, abdominal pain, headaches. Apparently, the patient has been very resistant to placement in a skilled nursing facility against the wishes of her daughter. The patient's daughter at the bedside supplements the history. She states that the patient is essentially bedbound for the past year   Assessment / Plan / Recommendation Clinical Impression  Pt presents with appearance of adequate swallow function. Swallow is strong and timely with clear vocal quality following sips. Pt does have a cough, coughed prior to PO trials and approx every 45 seconds throughout assessment, following liquids or solids or following conversation. Given that cough is constantly present and not observed directly after an otherwise normal swallow, do not suspect aspiration. Discussed with daughter, pt and RN. They agree to report any concerning observations during PO intake. Instructed dtr in aspiration precautions. SLP will f/u on Monday for tolerance.     Aspiration Risk  Mild    Diet Recommendation Regular;Thin liquid   Liquid Administration via: Cup;Straw Medication Administration: Whole meds with liquid Supervision: Patient able to self feed;Full supervision/cueing for compensatory strategies Compensations: Slow rate;Small sips/bites Postural Changes and/or Swallow Maneuvers:  Seated upright 90 degrees    Other  Recommendations Oral Care Recommendations: Oral care BID   Follow Up Recommendations  None    Frequency and Duration min 1 x/week  1 week   Pertinent Vitals/Pain NA    SLP Swallow Goals Patient will consume recommended diet without observed clinical signs of aspiration with: Minimal assistance Swallow Study Goal #1 - Progress: Progressing toward goal   Swallow Study Prior Functional Status       General HPI: 67 year old female with a history of ischemic dilated cardiomyopathy, right popliteal artery occlusion, and DVT previously treated with Coumadin, diabetes mellitus, hyperparathyroidism who presented to the emergency department after a fall at home. According to the patient, there was no syncope. The patient laid on her floor for 2 days unable to get up until she was found by her neighbor today. The patient was recently discharged from [facility] on 05/09/2013 after a 2 week stay for UTI and recurrent falls. The patient has not taken any of her medications and so she was discharged from the hospital including her furosemide, indoor, and aspirin.. She complains of some chest discomfort with shortness of breath. The patient has had some subjective fevers and chills. There is no history of nausea, vomiting, diarrhea, abdominal pain, headaches. Apparently, the patient has been very resistant to placement in a skilled nursing facility against the wishes of her daughter. The patient's daughter at the bedside supplements the history. She states that the patient is essentially bedbound for the past year Type of Study: Bedside swallow evaluation Previous Swallow Assessment: none in chart Diet Prior to this Study: Regular;Thin liquids Temperature Spikes Noted: No Respiratory Status: Supplemental O2 delivered via (comment) History of Recent Intubation: No Behavior/Cognition: Alert;Cooperative;Pleasant mood;Confused Oral Cavity - Dentition: Adequate natural  dentition Self-Feeding Abilities: Able to feed self Patient Positioning: Upright in bed Baseline Vocal Quality: Clear Volitional Cough: Strong;Congested Volitional Swallow: Able to elicit    Oral/Motor/Sensory Function Overall Oral Motor/Sensory Function: Appears within functional limits for tasks assessed   Ice Chips     Thin Liquid Thin Liquid: Within functional limits Presentation: Cup;Straw    Nectar Thick Nectar Thick Liquid: Not tested   Honey Thick Honey Thick Liquid: Not tested   Puree Puree: Within functional limits   Solid   GO    Solid: Within functional limits      [facility], [place] CCC-SLP [phone]  Claudine Mouton 05/13/2013,4:24 PM

## 2013-05-13 NOTE — Progress Notes (Signed)
[facility] PROGRESS NOTE  [name] CRASS ZOX:[id number] DOB: [birth year] DOA: 05/12/2013 PCP: No primary provider on file.  Assessment/Plan: Acute on chronic systolic CHF  -Secondary to noncompliance with medications  -Continue furosemide 40 mg IV every 12 hours  -Daily weights--patient has been 10.9 kg since 04/27/2013 -Strict I.'s and Os--difficult as pt is incontinent -Echocardiogram 10/17/2012 shows EF 20-25%  -Patient had heart catheterization in Nov. 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 Lucianne Muss)  -Part of the patient's hypercalcemia is also related to her immobility from her recent fall.  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, the patient's clinical presentation is more consistent with decompensated CHF at this time with massive anasarca and increasing pleural effusion  Hypothermia  -Urinalysis did not suggest UTI  -Blood cultures x2 sets  -Empiric vancomycin and aztreonam  -Check morning cortisol  -TSH 3.014 on 04/27/13  Recurrent falls  -likely due to deconditioning  -Patient's daughter states that the patient has been essentially bedbound for the past year with extreme weakness, but she had been resistant to going to skilled nursing facility  -PT evaluation  Dysphagia -Nursing reports coughing with meals -speech therapy consult for  swallow evaluation Right lower extremity wound with right popliteal occlusion  -wound care nurse consult  Family Communication:   Pt at beside Disposition Plan:   SNF  Antibiotics:   vancomycin 05/12/2013>>>  Aztreonam 05/12/2013>>>       Procedures/Studies: Dg Chest 2 View  05/12/2013   CLINICAL DATA:  Shortness of breath, pain, fall  EXAM: CHEST  2 VIEW  COMPARISON:  04/27/2013  FINDINGS: Cardiomegaly again noted. Stable linear scarring right midlung. There is small left pleural effusion with left lower lobe atelectasis or infiltrate.  IMPRESSION: Small left pleural effusion with left lower lobe atelectasis or infiltrate. Stable scarring or atelectasis in right midlung.   Electronically Signed   By: Natasha Mead   On: 05/12/2013 12:09   Dg Chest 2 View  04/27/2013   *RADIOLOGY REPORT*  Clinical Data: Fall.  CHEST - 2 VIEW  Comparison: 01/05/2013.  Findings: Chronic cardiopericardial enlargement.  Similar to prior, low volume lungs with band-like opacities. Prominent perihilar vessels. Small pleural effusions.  No overt edema in the upper lungs.  No evidence of acute fracture.  IMPRESSION: 1. Low volume lungs with basilar atelectasis and pleural fluid. 2.  Pulmonary venous congestion.   Original Report Authenticated By: Tiburcio Pea   Ct Head Wo Contrast  05/12/2013   CLINICAL DATA:  Fall, LOC  EXAM: CT HEAD WITHOUT CONTRAST  TECHNIQUE: Contiguous axial images were obtained from the base of the skull through the vertex without intravenous contrast.  COMPARISON:  04/04/2008  FINDINGS: There is skull swelling and subcutaneous stranding in left parietal region. There is soft tissue swelling and subcutaneous stranding left temporal region and left periorbital preseptal region. Left face/zygomatic region soft tissue swelling and subcutaneous stranding.  No intracranial hemorrhage, mass effect or midline shift. No paranasal  sinuses air-fluid levels. No intraventricular hemorrhage. Mild cerebral  atrophy. Mild periventricular white matter decreased attenuation probable due to chronic small vessel ischemic changes.  IMPRESSION: 1. There is scalp swelling and subcutaneous stranding left parietal region left temporal region left face and left periorbital. 2. No skull fracture. No intracranial hemorrhage, mass effect or midline shift. 3. Mild cerebral atrophy. Mild periventricular white matter decreased attenuation probable due to chronic small vessel ischemic changes.   Electronically Signed   By: Natasha Mead   On: 05/12/2013 12:45         Subjective: Patient is sleepy this morning but awakens easily. She is pleasantly confused. Denies any headache, fevers, chills, chest pain, shortness of breath, abdominal pain. No reports of vomiting. Nursing reports coughing with meals.  Objective: Filed Vitals:   05/13/13 0100 05/13/13 0400 05/13/13 0425 05/13/13 0800  BP: 100/65 96/56  95/73  Pulse: 84 82  73  Temp:   97.6 F (36.4 C) 97.4 F (36.3 C)  TempSrc:   Oral Oral  Resp: 31 19  17   Height:      Weight:   87.2 kg (192 lb 3.9 oz)   SpO2: 96% 95%  95%    Intake/Output Summary (Last 24 hours) at 05/13/13 1103 Last data filed at 05/13/13 0500  Gross per 24 hour  Intake     50 ml  Output    875 ml  Net   -825 ml   Weight change:  Exam:   General:  Pt is alert, follows commands appropriately, not in acute distress  HEENT: No icterus, No thrush,[place]/AT  Cardiovascular: RRR, S1/S2, no rubs, no gallops  Respiratory: Diminished breath sounds left base. Right-clear to auscultation. No wheezing.  Abdomen: Soft/+BS, non tender, non distended, no guarding; anasarca  Extremities: 2+ edema, No lymphangitis, No petechiae, No rashes, no synovitis  Data Reviewed: Basic Metabolic Panel:  Recent Labs Lab 05/07/13 0455 05/08/13 0511 05/09/13 0343 05/12/13 1055 05/13/13 0347  NA 135 133* 134* 137 138  K 4.4 4.5 4.1 4.4 3.9  CL 106 103 104 106 107  CO2 23 22 22 19 21   GLUCOSE  128* 153* 157* 87 125*  BUN 39* 40* 42* 48* 49*  CREATININE 0.92 1.11* 1.05 0.89 0.92  CALCIUM 10.7* 10.8* 10.8* 11.4* 10.6*   Liver Function Tests:  Recent Labs Lab 05/12/13 1055  AST 32  ALT 29  ALKPHOS 257*  BILITOT 1.1  PROT 6.7  ALBUMIN 2.1*   No results found for this basename: LIPASE, AMYLASE,  in the last 168 hours No results found for this basename: AMMONIA,  in the last 168 hours CBC:  Recent Labs Lab 05/12/13 1055 05/13/13 0347  WBC 8.6 7.5  NEUTROABS 6.5  --   HGB 14.1 12.5  HCT 43.2 38.6  MCV 83.6 84.6  PLT 229 209   Cardiac Enzymes:  Recent Labs Lab 05/12/13 1055 05/12/13 1633 05/12/13 2215  CKTOTAL 116  --   --   TROPONINI  --  <0.30 <0.30   BNP: No components found with this basename: POCBNP,  CBG:  Recent Labs Lab 05/09/13 0757 05/09/13 1136 05/12/13 1624 05/12/13 2206 05/13/13 0801  GLUCAP 165* 178* 78 96 143*    Recent Results (from the past 240 hour(s))  MRSA PCR SCREENING     Status: None   Collection Time    05/12/13  3:51 PM      Result Value Range Status   MRSA by PCR NEGATIVE  NEGATIVE Final  Comment:            The GeneXpert MRSA Assay (FDA     approved for NASAL specimens     only), is one component of a     comprehensive MRSA colonization     surveillance program. It is not     intended to diagnose MRSA     infection nor to guide or     monitor treatment for     MRSA infections.     Scheduled Meds: . aspirin EC  81 mg Oral Daily  . aztreonam  2 g Intravenous Q8H  . calcitonin  340 Units Subcutaneous Q12H  . collagenase   Topical Daily  . enoxaparin (LOVENOX) injection  40 mg Subcutaneous QHS  . furosemide  40 mg Intravenous BID  . insulin aspart  0-9 Units Subcutaneous TID WC  . sodium chloride  3 mL Intravenous Q12H  . vancomycin  1,000 mg Intravenous Q12H   Continuous Infusions:    Delmer Kowalski, DO  [facility] Pager [phone]  If 7PM-7AM, please contact  night-coverage [email or web address] Password [email or web address] 05/13/2013, 11:03 AM   LOS: 1 day

## 2013-05-13 NOTE — Consult Note (Signed)
Reason for Consult:CHF Referring Physician: TRH   Note: The HPI is limited by the patient status. She is not very communicative. She appears somnolent but responds to commands.  Most of the history is obtained from chart review and by the patient's daughter.   HPI: The patient is a 67 y/o female whom we initially saw in consult at [facility] in October 2013 after she presented with 2 weeks of lower extremity edema and SOB. At that time, apparently she had not seen an MD in over 30 years. Her EKG showed LBBB. Her BNP was 10k. She was admitted and diuresed. Lower extremity dopplers revealed a Rt post tibial vein DVT. Heparin and Coumadin were started and she was also treated for superimposed cellulitis. An arterial doppler of the LE revealed Rt popliteal artery on 06/29/12 after she complained of a cold Rt foot. It was ultimately decided diagnostic cath was indicated. Her Coumadin was held, Heparin continued, and she was transferred to our service at [facility] 07/05/12. Cath revealed severe 3 CAD. The pt did have transient renal insufficiency during this period that started after she was diuresed. Her Scr remained stable post cath. Dr Morton Peters saw her in consult 07/06/12. He did not feel she was currently a candidate for CABG with multiple active medical issues- DVT,cellulitis, UTI, and uncontrolled diabetes.  She returned to the [facility] ER on 9/19 after being found by her neighbor, on the floor of her home, after an apparent fall and was also found to be dyspneic. Apparently she was admitted and discharged recently for a UTI and recurrent falls. She had been a resident at a SNF, but recently voluntarily checked herself out and opted to care for herself at home. The patient's daughter, who is by her bedside, reports that the patient has a tendency of being very noncompliant and often refuses to take any of her prescribed medications. When the patient was found at home, it was suspected that she had been on the floor for at  least 2 days.  On arrival to the ED, she was noted to be hypoxic with O2 stats in the 70s. She was placed on 2L of supplemental O2 and her hypoxia resolved. Work-up in the ED revealed that she was volume overloaded. A CXR demonstrated a left pleural effusion. BNP was elevated at 30K.  Both a BMP and CBC were unremarkable. U/A was negative for UTI and a CT of the head was also negative.   The patient appears to be resting comfortably and is in no acute distress. She does not appear to be in any pain. No use of accessory muscles. She appears somnolent but responses to simple commands.    Past Medical History  Diagnosis Date  . 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 DVT, bilateral lower limbs 05/2012  . 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 GERD (gastroesophageal reflux disease)   . History of blood transfusion   . Coronary artery disease     Severe Multivessel Disase; 3 V Disease wtih LAD distribution Viability &  inferior scar.  . Numbness in feet 06/29/12    Right worse than left  . DVT (deep venous thrombosis) 06/29/12  . CHF (congestive heart failure)   . Venous embolism and thrombosis 01/09/13  . Diabetes mellitus type 2, uncontrolled, with complications 01/09/13    Very poorly controlled  . Coronary atherosclerosis of unspecified type of vessel, native or graft   .  Blood transfusion, without reported diagnosis 01/09/13  . Gangrene of foot 10/16/12    Right     Past Surgical History  Procedure Laterality Date  . Vaginal hysterectomy  ~ 1980  . Cesarean section  1979  . Lumbar disc surgery  1980's  . Facial reconstruction surgery    . Cardiac catheterization    . Amputation Right 10/16/2012    Procedure: AMPUTATION FOOT;  Surgeon: Kathryne Hitch, MD;  Location: [facility]S;  Service: Orthopedics;  Laterality: Right;  transmetatarsal amp  . I&d extremity Right 01/05/2013    Procedure: I & D Right  foot, Revision Transmet Amputation;  Surgeon: Kathryne Hitch, MD;  Location: [facility];  Service: Orthopedics;  Laterality: Right;  . Spine surgery  1980's    Lumbar disc    Family History  Problem Relation Age of Onset  . Cancer Mother   . CAD Mother   . Heart disease Mother     Heart Disease before age 69  . Hyperlipidemia Mother   . Hypertension Mother   . Heart attack Mother   . Cancer Father   . CAD Father     Social History:  reports that she has quit smoking. Her smoking use included Cigarettes. She has a 10 pack-year smoking history. She has never used smokeless tobacco. She reports that she does not drink alcohol or use illicit drugs.  Allergies:  Allergies  Allergen Reactions  . Penicillins Anaphylaxis  . Sulfa Antibiotics Anaphylaxis  . Levofloxacin Itching    Medications:  Prior to Admission medications   Medication Sig Start Date End Date Taking? Authorizing Provider  aspirin EC 81 MG EC tablet Take 1 tablet (81 mg total) by mouth daily. 07/09/12  Yes Luke K Kilroy, PA-C  collagenase (SANTYL) ointment Apply topically daily. Apply to right foot surgical site.  Apply 1/8 inch thickness to slough in wound bed for debridement. Top with NS moist dressing, dry gauze and wrap gauze (kerlix).  Secure with tape. Use for 2 weeks. 05/09/13  Yes Rodolph Bong, MD  furosemide (LASIX) 40 MG tablet Take 1 tablet (40 mg total) by mouth daily. 05/09/13  Yes Rodolph Bong, MD  isosorbide mononitrate (IMDUR) 30 MG 24 hr tablet Take 30 mg by mouth daily as needed (for weakness).   Yes Historical Provider, MD  metFORMIN (GLUCOPHAGE) 500 MG tablet Take 500 mg by mouth 2 (two) times daily with a meal.   Yes Historical Provider, MD  Multiple Vitamin (MULTIVITAMIN) LIQD Take 5 mLs by mouth daily.   Yes Historical Provider, MD  oxyCODONE (OXY IR/ROXICODONE) 5 MG immediate release tablet Take 1 tablet (5 mg total) by mouth every 4 (four) hours as needed for pain. 05/09/13  Yes  Rodolph Bong, MD     Results for orders placed during the hospital encounter of 05/12/13 (from the past 48 hour(s))  CBC WITH DIFFERENTIAL     Status: Abnormal   Collection Time    05/12/13 10:55 AM      Result Value Range   WBC 8.6  4.0 - 10.5 K/uL   RBC 5.17 (*) 3.87 - 5.11 MIL/uL   Hemoglobin 14.1  12.0 - 15.0 g/dL   HCT 45.4  09.8 - 11.9 %   MCV 83.6  78.0 - 100.0 fL   MCH 27.3  26.0 - 34.0 pg   MCHC 32.6  30.0 - 36.0 g/dL   RDW 14.7 (*) 82.9 - 56.2 %   Platelets 229  150 -  400 K/uL   Neutrophils Relative % 76  43 - 77 %   Neutro Abs 6.5  1.7 - 7.7 K/uL   Lymphocytes Relative 16  12 - 46 %   Lymphs Abs 1.3  0.7 - 4.0 K/uL   Monocytes Relative 9  3 - 12 %   Monocytes Absolute 0.7  0.1 - 1.0 K/uL   Eosinophils Relative 0  0 - 5 %   Eosinophils Absolute 0.0  0.0 - 0.7 K/uL   Basophils Relative 0  0 - 1 %   Basophils Absolute 0.0  0.0 - 0.1 K/uL   RBC Morphology BURR CELLS    COMPREHENSIVE METABOLIC PANEL     Status: Abnormal   Collection Time    05/12/13 10:55 AM      Result Value Range   Sodium 137  135 - 145 mEq/L   Potassium 4.4  3.5 - 5.1 mEq/L   Chloride 106  96 - 112 mEq/L   CO2 19  19 - 32 mEq/L   Glucose, Bld 87  70 - 99 mg/dL   BUN 48 (*) 6 - 23 mg/dL   Creatinine, Ser 7.82  0.50 - 1.10 mg/dL   Calcium 95.6 (*) 8.4 - 10.5 mg/dL   Total Protein 6.7  6.0 - 8.3 g/dL   Albumin 2.1 (*) 3.5 - 5.2 g/dL   AST 32  0 - 37 U/L   ALT 29  0 - 35 U/L   Alkaline Phosphatase 257 (*) 39 - 117 U/L   Total Bilirubin 1.1  0.3 - 1.2 mg/dL   GFR calc non Af Amer 66 (*) >90 mL/min   GFR calc Af Amer 77 (*) >90 mL/min   Comment: (NOTE)     The eGFR has been calculated using the CKD EPI equation.     This calculation has not been validated in all clinical situations.     eGFR's persistently <90 mL/min signify possible Chronic Kidney     Disease.  CK     Status: None   Collection Time    05/12/13 10:55 AM      Result Value Range   Total CK 116  7 - 177 U/L  PRO B  NATRIURETIC PEPTIDE     Status: Abnormal   Collection Time    05/12/13 10:55 AM      Result Value Range   Pro B Natriuretic peptide (BNP) 30492.0 (*) 0 - 125 pg/mL  URINALYSIS, ROUTINE W REFLEX MICROSCOPIC     Status: Abnormal   Collection Time    05/12/13 10:57 AM      Result Value Range   Color, Urine AMBER (*) YELLOW   Comment: BIOCHEMICALS MAY BE AFFECTED BY COLOR   APPearance CLOUDY (*) CLEAR   Specific Gravity, Urine 1.025  1.005 - 1.030   pH 5.5  5.0 - 8.0   Glucose, UA NEGATIVE  NEGATIVE mg/dL   Hgb urine dipstick TRACE (*) NEGATIVE   Bilirubin Urine SMALL (*) NEGATIVE   Ketones, ur NEGATIVE  NEGATIVE mg/dL   Protein, ur >213 (*) NEGATIVE mg/dL   Urobilinogen, UA 1.0  0.0 - 1.0 mg/dL   Nitrite NEGATIVE  NEGATIVE   Leukocytes, UA SMALL (*) NEGATIVE  URINE MICROSCOPIC-ADD ON     Status: Abnormal   Collection Time    05/12/13 10:57 AM      Result Value Range   Squamous Epithelial / LPF RARE  RARE   WBC, UA 7-10  <3 WBC/hpf   RBC /  HPF 0-2  <3 RBC/hpf   Bacteria, UA FEW (*) RARE   Urine-Other FEW YEAST     Comment: AMORPHOUS URATES/PHOSPHATES  URINE CULTURE     Status: None   Collection Time    05/12/13 10:57 AM      Result Value Range   Specimen Description URINE, CATHETERIZED     Special Requests NONE     Culture  Setup Time       Value: 05/12/2013 14:43     Performed at [facility] Count       Value: 6,000 COLONIES/ML     Performed at [facility]   Culture       Value: INSIGNIFICANT GROWTH     Performed at [facility]   Report Status 05/13/2013 FINAL    POCT I-STAT TROPONIN I     Status: None   Collection Time    05/12/13 11:03 AM      Result Value Range   Troponin i, poc 0.00  0.00 - 0.08 ng/mL   Comment 3            Comment: Due to the release kinetics of cTnI,     a negative result within the first hours     of the onset of symptoms does not rule out     myocardial infarction with certainty.     If myocardial  infarction is still suspected,     repeat the test at appropriate intervals.  CG4 I-STAT (LACTIC ACID)     Status: None   Collection Time    05/12/13 11:05 AM      Result Value Range   Lactic Acid, Venous 2.17  0.5 - 2.2 mmol/L  MRSA PCR SCREENING     Status: None   Collection Time    05/12/13  3:51 PM      Result Value Range   MRSA by PCR NEGATIVE  NEGATIVE   Comment:            The GeneXpert MRSA Assay (FDA     approved for NASAL specimens     only), is one component of a     comprehensive MRSA colonization     surveillance program. It is not     intended to diagnose MRSA     infection nor to guide or     monitor treatment for     MRSA infections.  CULTURE, BLOOD (ROUTINE X 2)     Status: None   Collection Time    05/12/13  4:15 PM      Result Value Range   Specimen Description BLOOD RIGHT HAND     Special Requests BOTTLES DRAWN AEROBIC AND ANAEROBIC 10CC     Culture  Setup Time       Value: 05/12/2013 20:24     Performed at [facility]   Culture       Value:        BLOOD CULTURE RECEIVED NO GROWTH TO DATE CULTURE WILL BE HELD FOR 5 DAYS BEFORE ISSUING A FINAL NEGATIVE REPORT     Performed at [facility]   Report Status PENDING    GLUCOSE, CAPILLARY     Status: None   Collection Time    05/12/13  4:24 PM      Result Value Range   Glucose-Capillary 78  70 - 99 mg/dL  CULTURE, BLOOD (ROUTINE X 2)     Status: None   Collection Time  05/12/13  4:30 PM      Result Value Range   Specimen Description BLOOD RIGHT ARM     Special Requests BOTTLES DRAWN AEROBIC ONLY 10CC     Culture  Setup Time       Value: 05/12/2013 20:24     Performed at [facility]   Culture       Value:        BLOOD CULTURE RECEIVED NO GROWTH TO DATE CULTURE WILL BE HELD FOR 5 DAYS BEFORE ISSUING A FINAL NEGATIVE REPORT     Performed at [facility]   Report Status PENDING    TROPONIN I     Status: None   Collection Time    05/12/13  4:33 PM      Result Value  Range   Troponin I <0.30  <0.30 ng/mL   Comment:            Due to the release kinetics of cTnI,     a negative result within the first hours     of the onset of symptoms does not rule out     myocardial infarction with certainty.     If myocardial infarction is still suspected,     repeat the test at appropriate intervals.  CORTISOL-AM, BLOOD     Status: Abnormal   Collection Time    05/12/13  4:33 PM      Result Value Range   Cortisol - AM 23.6 (*) 4.3 - 22.4 ug/dL   Comment: Performed at [facility]  HEMOGLOBIN A1C     Status: Abnormal   Collection Time    05/12/13  4:33 PM      Result Value Range   Hemoglobin A1C 7.0 (*) <5.7 %   Comment: (NOTE)                                                                               According to the ADA Clinical Practice Recommendations for 2011, when     HbA1c is used as a screening test:      >=6.5%   Diagnostic of Diabetes Mellitus               (if abnormal result is confirmed)     5.7-6.4%   Increased risk of developing Diabetes Mellitus     References:Diagnosis and Classification of Diabetes Mellitus,Diabetes     Care,2011,34(Suppl 1):S62-S69 and Standards of Medical Care in             Diabetes - 2011,Diabetes Care,2011,34 (Suppl 1):S11-S61.   Mean Plasma Glucose 154 (*) <117 mg/dL   Comment: Performed at [facility]  GLUCOSE, CAPILLARY     Status: None   Collection Time    05/12/13 10:06 PM      Result Value Range   Glucose-Capillary 96  70 - 99 mg/dL  TROPONIN I     Status: None   Collection Time    05/12/13 10:15 PM      Result Value Range   Troponin I <0.30  <0.30 ng/mL   Comment:            Due to the release kinetics of cTnI,  a negative result within the first hours     of the onset of symptoms does not rule out     myocardial infarction with certainty.     If myocardial infarction is still suspected,     repeat the test at appropriate intervals.  BASIC METABOLIC PANEL     Status: Abnormal    Collection Time    05/13/13  3:47 AM      Result Value Range   Sodium 138  135 - 145 mEq/L   Potassium 3.9  3.5 - 5.1 mEq/L   Chloride 107  96 - 112 mEq/L   CO2 21  19 - 32 mEq/L   Glucose, Bld 125 (*) 70 - 99 mg/dL   BUN 49 (*) 6 - 23 mg/dL   Creatinine, Ser 1.61  0.50 - 1.10 mg/dL   Calcium 09.6 (*) 8.4 - 10.5 mg/dL   GFR calc non Af Amer 63 (*) >90 mL/min   GFR calc Af Amer 74 (*) >90 mL/min   Comment: (NOTE)     The eGFR has been calculated using the CKD EPI equation.     This calculation has not been validated in all clinical situations.     eGFR's persistently <90 mL/min signify possible Chronic Kidney     Disease.  CBC     Status: Abnormal   Collection Time    05/13/13  3:47 AM      Result Value Range   WBC 7.5  4.0 - 10.5 K/uL   RBC 4.56  3.87 - 5.11 MIL/uL   Hemoglobin 12.5  12.0 - 15.0 g/dL   HCT 04.5  40.9 - 81.1 %   MCV 84.6  78.0 - 100.0 fL   MCH 27.4  26.0 - 34.0 pg   MCHC 32.4  30.0 - 36.0 g/dL   RDW 91.4 (*) 78.2 - 95.6 %   Platelets 209  150 - 400 K/uL  GLUCOSE, CAPILLARY     Status: Abnormal   Collection Time    05/13/13  8:01 AM      Result Value Range   Glucose-Capillary 143 (*) 70 - 99 mg/dL  GLUCOSE, CAPILLARY     Status: Abnormal   Collection Time    05/13/13 12:08 PM      Result Value Range   Glucose-Capillary 126 (*) 70 - 99 mg/dL    Dg Chest 2 View  10/07/863   CLINICAL DATA:  Shortness of breath, pain, fall  EXAM: CHEST  2 VIEW  COMPARISON:  04/27/2013  FINDINGS: Cardiomegaly again noted. Stable linear scarring right midlung. There is small left pleural effusion with left lower lobe atelectasis or infiltrate.  IMPRESSION: Small left pleural effusion with left lower lobe atelectasis or infiltrate. Stable scarring or atelectasis in right midlung.   Electronically Signed   By: Natasha Mead   On: 05/12/2013 12:09   Ct Head Wo Contrast  05/12/2013   CLINICAL DATA:  Fall, LOC  EXAM: CT HEAD WITHOUT CONTRAST  TECHNIQUE: Contiguous axial images were  obtained from the base of the skull through the vertex without intravenous contrast.  COMPARISON:  04/04/2008  FINDINGS: There is skull swelling and subcutaneous stranding in left parietal region. There is soft tissue swelling and subcutaneous stranding left temporal region and left periorbital preseptal region. Left face/zygomatic region soft tissue swelling and subcutaneous stranding.  No intracranial hemorrhage, mass effect or midline shift. No paranasal sinuses air-fluid levels. No intraventricular hemorrhage. Mild cerebral atrophy. Mild periventricular white matter decreased attenuation probable  due to chronic small vessel ischemic changes.  IMPRESSION: 1. There is scalp swelling and subcutaneous stranding left parietal region left temporal region left face and left periorbital. 2. No skull fracture. No intracranial hemorrhage, mass effect or midline shift. 3. Mild cerebral atrophy. Mild periventricular white matter decreased attenuation probable due to chronic small vessel ischemic changes.   Electronically Signed   By: Natasha Mead   On: 05/12/2013 12:45    Review of Systems  Unable to perform ROS: patient nonverbal   Blood pressure 91/68, pulse 77, temperature 97.4 F (36.3 C), temperature source Oral, resp. rate 19, height 5\' 6"  (1.676 m), weight 192 lb 3.9 oz (87.2 kg), SpO2 96.00%. Physical Exam  Constitutional: She appears well-developed and well-nourished. No distress.  Neck: No JVD present.  Cardiovascular: Normal rate, regular rhythm, normal heart sounds and intact distal pulses.  Exam reveals no gallop and no friction rub.   No murmur heard. Pulses:      Radial pulses are 2+ on the right side, and 2+ on the left side.       Dorsalis pedis pulses are 2+ on the left side. Right dorsalis pedis pulse not accessible.  Respiratory: Effort normal.  Pulmonary exam limited by patient's status. Unable to auscultate posterior thorax. Cannot exclude rales.   GI: Soft. Bowel sounds are normal. She  exhibits mass. She exhibits no distension.  Musculoskeletal: She exhibits edema (1+ bilateral LEE).  Skin: Skin is warm and dry. She is not diaphoretic.    Assessment/Plan: Active Problems:   LBBB (left bundle branch block)   DM, Type 2 IDDM   Ischemic cardiomyopathy, EF 25% 05/13/13   CAD, 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 She needs CABG   Hypoxemia   Falling   Hypercalcemia   Systolic and diastolic CHF, acute on chronic   History of noncompliance with medical treatment, presenting hazards to health  Plan:  67 y/o female with known CAD and severe systolic dysfunction with an EF of 30-35% (06/2012), admitted for a/c combined systolic and diastolic CHF. Likely subsequent to medical noncompliance. BNP >30K, CXR consistent with CHF. Repeat 2D echo shows worsened systolic function with an EF of 20-25%. She has been reciving IV Lasix, 40 mg BID. She has diuresed 1.3 L since admission yesterday. She appears to have significant peripheral edema on exam and will need further diuresis, however will need to use caution as she is borderline hypotensive. Her weight, when she was discharged in November 2013 for CHF, was 160 lb. Her weight today is 192. Will need to try to diurese down to euvolemic state if possible. Continue with daily weights, strict I/Os and low sodium diet. She will need to be on appropriate HF meds. Would not recommend initiating a BB at this time during acute exacerbation. She needs to be on an ACE/ARB for systolic dysfunction, however this is also limited by BP. Compliance will be a big issue. Would strongly encourage SNF care at time of discharge. MD to follow.    Allayne Butcher, PA-C 05/13/2013, 3:54 PM     Patient seen and examined. Agree with assessment and plan. 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 She has a severe ischemic cardiomyopathy and had cath by Dr. Herbie Baltimore in Nov 2013. She has PVD and in Oct was admitted with popliteal DVT and in  Feb 2014 underwent R transmetatarsal amputation. She has been noncompliant with meds. She was recently found after a fall  by her neighbor ? 2 days duration following a fall. She was admitted and found to be hypoxic in CHF with BNP >30K. Echo has shown EF now 20 -25%.  BP is low limiting medication titration. Will initiate ranexa at 500 mg bid since ischemic etiology based on anatomy undoubtedly a contributing factor. Will add low dose lanoxin at 0.0625 mg. May benefit from dobutamine initiation, but will not start yet. Remotely she had been on coumadin but with fall risk this may not be prudent to resume. Titrate meds as hemodynamic allow.    Lennette Bihari, MD, [facility] 05/13/2013 3:56 PM

## 2013-05-13 NOTE — Progress Notes (Signed)
   CARE MANAGEMENT NOTE 05/13/2013  Patient:  [name] [name], [name] [name]   Account Number:  [id number]  Date Initiated:  05/13/2013  Documentation initiated by:  [facility]  Subjective/Objective Assessment:   Acute on chronic systolic CHF     Action/Plan:   SNF vs HH   Anticipated DC Date:     Anticipated DC Plan:  HOME W HOME HEALTH SERVICES      DC Planning Services  CM consult      Choice offered to / List presented to:             Status of service:  In process, will continue to follow Medicare Important Message given?   (If response is "NO", the following Medicare IM given date fields will be blank) Date Medicare IM given:   Date Additional Medicare IM given:    Discharge Disposition:    Per UR Regulation:    If discussed at Long Length of Stay Meetings, dates discussed:    Comments:  05/13/2013 1415 Pt was set up with [facility]. Waiting final dc disposition.   Isidoro Donning RN CCM Case Mgmt phone [phone]

## 2013-05-14 ENCOUNTER — Inpatient Hospital Stay (HOSPITAL_COMMUNITY): Payer: Medicare Other

## 2013-05-14 DIAGNOSIS — I739 Peripheral vascular disease, unspecified: Secondary | ICD-10-CM

## 2013-05-14 DIAGNOSIS — I5043 Acute on chronic combined systolic (congestive) and diastolic (congestive) heart failure: Secondary | ICD-10-CM | POA: Diagnosis present

## 2013-05-14 DIAGNOSIS — Z9119 Patient's noncompliance with other medical treatment and regimen: Secondary | ICD-10-CM

## 2013-05-14 LAB — CORTISOL-AM, BLOOD: Cortisol - AM: 21.6 ug/dL (ref 4.3–22.4)

## 2013-05-14 LAB — CALCIUM, IONIZED: Calcium, Ion: 1.48 mmol/L — ABNORMAL HIGH (ref 1.13–1.30)

## 2013-05-14 LAB — CBC
MCHC: 31.7 g/dL (ref 30.0–36.0)
RDW: 21.6 % — ABNORMAL HIGH (ref 11.5–15.5)

## 2013-05-14 LAB — BASIC METABOLIC PANEL
Calcium: 10.2 mg/dL (ref 8.4–10.5)
GFR calc Af Amer: 80 mL/min — ABNORMAL LOW (ref >90)
GFR calc non Af Amer: 69 mL/min — ABNORMAL LOW (ref >90)
Potassium: 3.4 mEq/L — ABNORMAL LOW (ref 3.5–5.1)
Sodium: 137 mEq/L (ref 135–145)

## 2013-05-14 LAB — GLUCOSE, CAPILLARY: Glucose-Capillary: 141 mg/dL — ABNORMAL HIGH (ref 70–99)

## 2013-05-14 MED ORDER — FUROSEMIDE 10 MG/ML IJ SOLN
60.0000 mg | Freq: Two times a day (BID) | INTRAMUSCULAR | Status: DC
  Administered 2013-05-14 – 2013-05-17 (×6): 60 mg via INTRAVENOUS
  Filled 2013-05-14 (×9): qty 6

## 2013-05-14 NOTE — Progress Notes (Signed)
[facility] PROGRESS NOTE  SHAKEITA VANDEVANDER ZOX:[id number] DOB: 10/29/1945 DOA: 05/12/2013 PCP: No primary provider on file.  Assessment/Plan: Acute on chronic systolic CHF  -Secondary to noncompliance with medications  -Continue furosemide 40 mg IV every 12 hours  -Daily weights--patient has gained 10.9 kg since 04/27/2013 - No significant drop in weight since admission  -Strict I.'s and Os--difficult as pt is incontinent  -Echocardiogram 05/13/2013 shows EF 20-25%  -Patient had heart catheterization in Nov. 2013 which showed Severe Ischemic Cardiomyopathy with 100% mid occlusion of the RCA and subtotal occlusion of both the circumflex and the LAD with 95+ % lesions with multiple other significant lesions in his vessels  -Repeat chest x-ray--slight worsening left pleural effusion -venous duplex LE--negative DVT - Increase furosemide to 60 mg IV every 12 hours--blood pressure has limited aggressive diuresis -Appreciate cardiology evaluation--continue digoxin, Ranexa--may need dobutamine/milrinone Primary hyperparathyroidism with hypercalcemia  - Discontinue calcitonin after today's dosing  -Continue furosemide IV  -Patient has missed an outpatient endocrine appt (Dr. Lucianne Muss)  -Part of the patient's hypercalcemia is also related to her immobility from her recent fall.  -Ionized calcium  Diabetes mellitus type 2  -NovoLog sliding scale--change to sensitive scale  - 05/10/2013 hemoglobin A1c--7.0  Chest discomfort  -Cycle troponins--neg  -EKG shows unchanged left bundle branch block  -Although the patient has a history of DVTs, the patient's clinical presentation is more consistent with decompensated CHF at this time with massive anasarca and increasing pleural effusion  Hypothermia  -Urinalysis did not suggest UTI  -Blood cultures x2 sets--negative to date  - Discontinue antibiotics and observe  -Check morning cortisol--pending -TSH 3.014 on 04/27/13  Recurrent falls  -likely  due to deconditioning  -Patient's daughter states that the patient has been essentially bedbound for the past year with extreme weakness, but she had been resistant to going to skilled nursing facility  -PT evaluation  Dysphagia  -Nursing reports coughing with meals  -appreciate speech therapy eval -continue regular diet  Right lower extremity wound with right popliteal occlusion  -wound care nurse consult  Family Communication: daughter updated at beside  Disposition Plan: SNF  Antibiotics:  vancomycin 05/12/2013>>> 05/14/2013  Aztreonam 05/12/2013>>>05/14/2013         Procedures/Studies: Dg Chest 2 View  05/12/2013   CLINICAL DATA:  Shortness of breath, pain, fall  EXAM: CHEST  2 VIEW  COMPARISON:  04/27/2013  FINDINGS: Cardiomegaly again noted. Stable linear scarring right midlung. There is small left pleural effusion with left lower lobe atelectasis or infiltrate.  IMPRESSION: Small left pleural effusion with left lower lobe atelectasis or infiltrate. Stable scarring or atelectasis in right midlung.   Electronically Signed   By: Natasha Mead   On: 05/12/2013 12:09   Dg Chest 2 View  04/27/2013   *RADIOLOGY REPORT*  Clinical Data: Fall.  CHEST - 2 VIEW  Comparison: 01/05/2013.  Findings: Chronic cardiopericardial enlargement.  Similar to prior, low volume lungs with band-like opacities. Prominent perihilar vessels. Small pleural effusions.  No overt edema in the upper lungs.  No evidence of acute fracture.  IMPRESSION: 1. Low volume lungs with basilar atelectasis and pleural fluid. 2.  Pulmonary venous congestion.   Original Report Authenticated By: Tiburcio Pea   Ct Head Wo Contrast  05/12/2013   CLINICAL DATA:  Fall, LOC  EXAM: CT HEAD WITHOUT CONTRAST  TECHNIQUE: Contiguous axial images were obtained from the base of the skull through the vertex without intravenous contrast.  COMPARISON:  04/04/2008  FINDINGS: There is skull swelling  and subcutaneous stranding in left parietal  region. There is soft tissue swelling and subcutaneous stranding left temporal region and left periorbital preseptal region. Left face/zygomatic region soft tissue swelling and subcutaneous stranding.  No intracranial hemorrhage, mass effect or midline shift. No paranasal sinuses air-fluid levels. No intraventricular hemorrhage. Mild cerebral atrophy. Mild periventricular white matter decreased attenuation probable due to chronic small vessel ischemic changes.  IMPRESSION: 1. There is scalp swelling and subcutaneous stranding left parietal region left temporal region left face and left periorbital. 2. No skull fracture. No intracranial hemorrhage, mass effect or midline shift. 3. Mild cerebral atrophy. Mild periventricular white matter decreased attenuation probable due to chronic small vessel ischemic changes.   Electronically Signed   By: Natasha Mead   On: 05/12/2013 12:45   Dg Chest Port 1 View  05/14/2013   CLINICAL DATA:  Pleural effusion  EXAM: PORTABLE CHEST - 1 VIEW  COMPARISON:  05/12/2013  FINDINGS: Cardiomegaly with mild interstitial edema and moderate left pleural effusion, increased.  Suspected small left pleural effusion, new, with trace fluid along the right fissure.  Underlying left lower lobe opacity, likely atelectasis, pneumonia not excluded.  IMPRESSION: Cardiomegaly with mild interstitial edema.  Moderate left and small right pleural effusions, increased.  Underlying left lower lobe opacity, likely atelectasis.   Electronically Signed   By: Charline Bills M.D.   On: 05/14/2013 07:29         Subjective: Patient states that she is breathing a little better but has significant dyspnea with minimal exertion. Denies any dizziness, fevers, chills, nausea, vomiting, diarrhea, abdominal pain. She still has some intermittent chest discomfort.  Objective: Filed Vitals:   05/14/13 0919 05/14/13 0920 05/14/13 0956 05/14/13 1200  BP:      Pulse:   85   Temp: 95 F (35 C) 96.7 F (35.9  C)  96.1 F (35.6 C)  TempSrc: Oral Rectal  Rectal  Resp:      Height:      Weight:      SpO2:        Intake/Output Summary (Last 24 hours) at 05/14/13 1416 Last data filed at 05/14/13 1354  Gross per 24 hour  Intake    130 ml  Output   1300 ml  Net  -1170 ml   Weight change: 0.4 kg (14.1 oz) Exam:   General:  Pt is alert, follows commands appropriately, not in acute distress  HEENT: No icterus, No thrush, [place]/AT  Cardiovascular: RRR, S1/S2, no rubs, no gallops  Respiratory: Bibasilar crackles. Diminished breath sounds at the bases. No wheezing.  Abdomen: Soft/+BS, non tender, non distended, no guarding  Extremities: 3+ LE edema, No lymphangitis, No petechiae, No rashes, no synovitis  Data Reviewed: Basic Metabolic Panel:  Recent Labs Lab 05/08/13 0511 05/09/13 0343 05/12/13 1055 05/13/13 0347 05/14/13 0354  NA 133* 134* 137 138 137  K 4.5 4.1 4.4 3.9 3.4*  CL 103 104 106 107 106  CO2 22 22 19 21 24   GLUCOSE 153* 157* 87 125* 106*  BUN 40* 42* 48* 49* 44*  CREATININE 1.11* 1.05 0.89 0.92 0.86  CALCIUM 10.8* 10.8* 11.4* 10.6* 10.2   Liver Function Tests:  Recent Labs Lab 05/12/13 1055  AST 32  ALT 29  ALKPHOS 257*  BILITOT 1.1  PROT 6.7  ALBUMIN 2.1*   No results found for this basename: LIPASE, AMYLASE,  in the last 168 hours No results found for this basename: AMMONIA,  in the last 168 hours CBC:  Recent Labs Lab 05/12/13 1055 05/13/13 0347 05/14/13 0354  WBC 8.6 7.5 5.4  NEUTROABS 6.5  --   --   HGB 14.1 12.5 12.6  HCT 43.2 38.6 39.8  MCV 83.6 84.6 85.4  PLT 229 209 196   Cardiac Enzymes:  Recent Labs Lab 05/12/13 1055 05/12/13 1633 05/12/13 2215  CKTOTAL 116  --   --   TROPONINI  --  <0.30 <0.30   BNP: No components found with this basename: POCBNP,  CBG:  Recent Labs Lab 05/12/13 2206 05/13/13 0801 05/13/13 1208 05/13/13 1625 05/14/13 0916  GLUCAP 96 143* 126* 101* 141*    Recent Results (from the past 240  hour(s))  URINE CULTURE     Status: None   Collection Time    05/12/13 10:57 AM      Result Value Range Status   Specimen Description URINE, CATHETERIZED   Final   Special Requests NONE   Final   Culture  Setup Time     Final   Value: 05/12/2013 14:43     Performed at [facility] Count     Final   Value: 6,000 COLONIES/ML     Performed at [facility]   Culture     Final   Value: INSIGNIFICANT GROWTH     Performed at [facility]   Report Status 05/13/2013 FINAL   Final  MRSA PCR SCREENING     Status: None   Collection Time    05/12/13  3:51 PM      Result Value Range Status   MRSA by PCR NEGATIVE  NEGATIVE Final   Comment:            The GeneXpert MRSA Assay (FDA     approved for NASAL specimens     only), is one component of a     comprehensive MRSA colonization     surveillance program. It is not     intended to diagnose MRSA     infection nor to guide or     monitor treatment for     MRSA infections.  CULTURE, BLOOD (ROUTINE X 2)     Status: None   Collection Time    05/12/13  4:15 PM      Result Value Range Status   Specimen Description BLOOD RIGHT HAND   Final   Special Requests BOTTLES DRAWN AEROBIC AND ANAEROBIC 10CC   Final   Culture  Setup Time     Final   Value: 05/12/2013 20:24     Performed at [facility]   Culture     Final   Value:        BLOOD CULTURE RECEIVED NO GROWTH TO DATE CULTURE WILL BE HELD FOR 5 DAYS BEFORE ISSUING A FINAL NEGATIVE REPORT     Performed at [facility]   Report Status PENDING   Incomplete  CULTURE, BLOOD (ROUTINE X 2)     Status: None   Collection Time    05/12/13  4:30 PM      Result Value Range Status   Specimen Description BLOOD RIGHT ARM   Final   Special Requests BOTTLES DRAWN AEROBIC ONLY 10CC   Final   Culture  Setup Time     Final   Value: 05/12/2013 20:24     Performed at [facility]   Culture     Final   Value:        BLOOD CULTURE RECEIVED NO GROWTH  TO  DATE CULTURE WILL BE HELD FOR 5 DAYS BEFORE ISSUING A FINAL NEGATIVE REPORT     Performed at [facility]   Report Status PENDING   Incomplete     Scheduled Meds: . aspirin EC  81 mg Oral Daily  . aztreonam  2 g Intravenous Q8H  . calcitonin  340 Units Subcutaneous Q12H  . collagenase   Topical Daily  . digoxin  0.0625 mg Oral Daily  . enoxaparin (LOVENOX) injection  40 mg Subcutaneous QHS  . furosemide  60 mg Intravenous BID  . insulin aspart  0-9 Units Subcutaneous TID WC  . ranolazine  500 mg Oral BID  . sodium chloride  3 mL Intravenous Q12H  . vancomycin  1,000 mg Intravenous Q12H   Continuous Infusions:    Jeremy Ditullio, DO  [facility] Pager [phone]  If 7PM-7AM, please contact night-coverage [email or web address] Password [email or web address] 05/14/2013, 2:16 PM   LOS: 2 days

## 2013-05-14 NOTE — Progress Notes (Signed)
Subjective:  Breathing a bit better today, still dyspneic with minimal exertion. Also notes intermittent chest discomfort. No F/C/S, etc.  Objective:  Vital Signs in the last 24 hours: Temp:  [95 F (35 C)-97.5 F (36.4 C)] 96.1 F (35.6 C) (09/21 1200) Pulse Rate:  [76-85] 83 (09/21 1000) Resp:  [18-26] 18 (09/21 1000) BP: (91-106)/(55-68) 105/55 mmHg (09/21 1000) SpO2:  [93 %-98 %] 95 % (09/21 1000) Weight:  [192 lb 3.9 oz (87.2 kg)] 192 lb 3.9 oz (87.2 kg) (09/21 0500)  Intake/Output from previous day: 09/20 0701 - 09/21 0700 In: 190 [I.V.:90; IV Piggyback:100] Out: 1650 [Urine:1650] Intake/Output from this shift: Total I/O In: 80 [I.V.:80] Out: 550 [Urine:550]  Physical Exam: General appearance: alert, appears older than stated age, no distress, uncooperative and follows commands,but does not attempt to understand  Neck: no adenopathy, no carotid bruit, supple, symmetrical, trachea midline, thyroid not enlarged, symmetric, no tenderness/mass/nodules and cannot truly see JVP due to body habitus; + HJR Lungs: poor effort with decrased BS in both bases with dullness to percussion. Heart: S1, S2 normal and distant Heart Sounds - no obvious M/R - sot S4 Abdomen: soft, non-tender; bowel sounds normal; no masses,  no organomegaly and mild distension with sub Q edema; no clear ascites. Extremities: edema 3+ to thighs & also UE -L>R Pulses: 2+ and symmetric Neurologic: Mental status: alertness: alert, orientation: time, date, person, place, city, president, affect: blunted, flat and after long talk - she is fearful of her health condition & is scared to consider CABG. Cranial nerves: normal  Lab Results:  Recent Labs  05/13/13 0347 05/14/13 0354  WBC 7.5 5.4  HGB 12.5 12.6  PLT 209 196    Recent Labs  05/13/13 0347 05/14/13 0354  NA 138 137  K 3.9 3.4*  CL 107 106  CO2 21 24  GLUCOSE 125* 106*  BUN 49* 44*  CREATININE 0.92 0.86    Recent Labs  05/12/13 1633  05/12/13 2215  TROPONINI <0.30 <0.30   Hepatic Function Panel  Recent Labs  05/12/13 1055  PROT 6.7  ALBUMIN 2.1*  AST 32  ALT 29  ALKPHOS 257*  BILITOT 1.1   Imaging: Imaging results have been reviewed  Cardiac Studies:  Assessment/Plan:  Principal Problem:   Acute on chronic combined systolic and diastolic congestive heart failure, NYHA class 3 Active Problems:   Ischemic cardiomyopathy, EF 25% 05/13/13   CAD, 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 She needs CABG   DM, Type 2 IDDM   LBBB (left bundle branch block)   Hypoxemia   Falling   Hypercalcemia   Systolic and diastolic CHF, acute on chronic   History of noncompliance with medical treatment, presenting hazards to health  The patient is known to our service - but was lost to f/u.  Was supposed to go to CHF clinic also. Found lying on the floor - ? 2 days; says she was knocked over & could not get up (does not want to get into details)  Moderate diuresis yesterday & BP/ Renal Fx  holding steady.  --> we have a long way to go before we are done.  ~11L is a good estimate. (I wonder if we may need to consider thoracentesis of pleural effusion).  I agree with increased dose of Lasix - may add metolazone for additional diuresis.   Add Aldactone.  Reassess UOP & BP tomorrow - may well benefit from inotropic support, but with her severe (non-revascularized) CAD, I am not sure that  she could tolerate that.    It would be nice to allow for initiation of afterload reduction.  -- this has been a problem in the past; but had been of 6.25 mg bid Coreg   (had not been on ACE-I/ARB due to renal insufficiency - no longer present)   Ranexa added for ICM/CAD; will add back IMDUR.  Will need statin  Will discuss with colleagues (Dr. Royann Shivers - listed as Primary Cardiologist, although she never came to office) & consider consulting Advanced HF service for assistance.  I doubt that any advanced Rx would be offered due to her history of  non-adherence / & lack of follow-up.  I agree with Dr. Tresa Endo re: re-initiating anticoagulation with warfarin & recent fall. She wore LifeVest temporarily, but sent it back after it "shocked her". She did not f/u with Dr. Donata Clay to discuss +/- CABG,  (or Dr. Hart Rochester for PVD); She also was non-adherent with warfarin & getting INR checks (s/p Bilateral LE DVTs with ? PE). -->she is simply terrified of surgery - vascular or CABG  States that she did not come to appts b/c "she felt better"   Unfortunately, she simply is not safe to go home, but has refused SNF in the past & checked out of her recent one.   LOS: 2 days    Mcclain Shall W 05/14/2013, 4:45 PM

## 2013-05-15 DIAGNOSIS — I2510 Atherosclerotic heart disease of native coronary artery without angina pectoris: Secondary | ICD-10-CM

## 2013-05-15 DIAGNOSIS — I743 Embolism and thrombosis of arteries of the lower extremities: Secondary | ICD-10-CM

## 2013-05-15 LAB — GLUCOSE, CAPILLARY: Glucose-Capillary: 128 mg/dL — ABNORMAL HIGH (ref 70–99)

## 2013-05-15 LAB — TROPONIN I
Troponin I: <0.3 ng/mL (ref <0.30)
Troponin I: <0.3 ng/mL (ref <0.30)

## 2013-05-15 LAB — BASIC METABOLIC PANEL
BUN: 43 mg/dL — ABNORMAL HIGH (ref 6–23)
Chloride: 106 mEq/L (ref 96–112)
Creatinine, Ser: 0.9 mg/dL (ref 0.50–1.10)
GFR calc Af Amer: 76 mL/min — ABNORMAL LOW (ref >90)
GFR calc non Af Amer: 65 mL/min — ABNORMAL LOW (ref >90)
Glucose, Bld: 102 mg/dL — ABNORMAL HIGH (ref 70–99)

## 2013-05-15 MED ORDER — ISOSORBIDE MONONITRATE ER 30 MG PO TB24
30.0000 mg | ORAL_TABLET | Freq: Every day | ORAL | Status: DC
  Filled 2013-05-15: qty 1

## 2013-05-15 MED ORDER — METOLAZONE 5 MG PO TABS
5.0000 mg | ORAL_TABLET | Freq: Every day | ORAL | Status: DC
  Administered 2013-05-15 – 2013-05-17 (×3): 5 mg via ORAL
  Filled 2013-05-15 (×4): qty 1

## 2013-05-15 MED ORDER — NITROGLYCERIN 2 % TD OINT
1.0000 [in_us] | TOPICAL_OINTMENT | Freq: Three times a day (TID) | TRANSDERMAL | Status: DC
  Administered 2013-05-15 – 2013-05-18 (×9): 1 [in_us] via TOPICAL
  Filled 2013-05-15 (×2): qty 30

## 2013-05-15 NOTE — Progress Notes (Signed)
CARE MANAGEMENT NOTE 05/15/2013  Patient:  KAMY, POINSETT   Account Number:  [id number]  Date Initiated:  05/13/2013  Documentation initiated by:  [facility]  Subjective/Objective Assessment:   Acute on chronic systolic CHF     Action/Plan:   SNF vs HH   Anticipated DC Date:  05/18/2013   Anticipated DC Plan:  HOME W HOME HEALTH SERVICES      DC Planning Services  CM consult      Choice offered to / List presented to:             Status of service:  In process, will continue to follow Medicare Important Message given?   (If response is "NO", the following Medicare IM given date fields will be blank) Date Medicare IM given:   Date Additional Medicare IM given:    Discharge Disposition:    Per UR Regulation:    If discussed at Long Length of Stay Meetings, dates discussed:    Comments:  [id number]/Mauro Arps Stark Jock, BSN, CCM [phone] Chart Reviewed for discharge and hospital needs. Discharge needs at time of review:  Nonepatient refused to answer door or p[hone at home when [facility] to [facility].  hhc may not be an option. Review of patient progress due on [id number].   05/13/2013 1415 Pt was set up with [facility]. Waiting final dc disposition.   Isidoro Donning RN CCM Case Mgmt phone [phone]

## 2013-05-15 NOTE — Progress Notes (Signed)
Subjective: + chest pressure. Assoc. With mild SOB  Objective: Vital signs in last 24 hours: Temp:  [96.1 F (35.6 C)-97.6 F (36.4 C)] 97.6 F (36.4 C) (09/22 0400) Pulse Rate:  [73-87] 87 (09/22 0800) Resp:  [16-25] 23 (09/22 0800) BP: (94-115)/(50-72) 103/55 mmHg (09/22 0800) SpO2:  [92 %-98 %] 92 % (09/22 0800) Weight:  [188 lb 4.4 oz (85.4 kg)] 188 lb 4.4 oz (85.4 kg) (09/22 0700) Weight change: -3 lb 15.5 oz (-1.8 kg) Last BM Date: 05/15/13 Intake/Output from previous day: -700 ( -2815 since admit)  Wt 188.4 down from 192 09/21 0701 - 09/22 0700 In: 360 [P.O.:240; I.V.:120] Out: 950 [Urine:950] Intake/Output this shift:    PE: General:Pleasant affect, NAD Skin:Warm and dry, brisk capillary refill HEENT:normocephalic, sclera clear, mucus membranes moist Heart:S1S2 RRR without murmur, gallup, rub or click Lungs:clear though diminished without rales, rhonchi, or wheezes ZOX:WRUE, non tender, + BS, do not palpate liver spleen or masses Ext:3+ lower ext edema up through abd  Neuro:alert and oriented, MAE, follows commands, + facial symmetry   Lab Results:  Recent Labs  05/13/13 0347 05/14/13 0354  WBC 7.5 5.4  HGB 12.5 12.6  HCT 38.6 39.8  PLT 209 196   BMET  Recent Labs  05/14/13 0354 05/15/13 0355  NA 137 138  K 3.4* 3.6  CL 106 106  CO2 24 25  GLUCOSE 106* 102*  BUN 44* 43*  CREATININE 0.86 0.90  CALCIUM 10.2 10.0    Recent Labs  05/12/13 1633 05/12/13 2215  TROPONINI <0.30 <0.30    No results found for this basename: CHOL, HDL, LDLCALC, LDLDIRECT, TRIG, CHOLHDL   Lab Results  Component Value Date   HGBA1C 7.0* 05/12/2013     Lab Results  Component Value Date   TSH 3.014 04/27/2013    Hepatic Function Panel  Recent Labs  05/12/13 1055  PROT 6.7  ALBUMIN 2.1*  AST 32  ALT 29  ALKPHOS 257*  BILITOT 1.1     Studies/Results: Dg Chest Port 1 View  05/14/2013   CLINICAL DATA:  Pleural effusion  EXAM: PORTABLE CHEST - 1  VIEW  COMPARISON:  05/12/2013  FINDINGS: Cardiomegaly with mild interstitial edema and moderate left pleural effusion, increased.  Suspected small left pleural effusion, new, with trace fluid along the right fissure.  Underlying left lower lobe opacity, likely atelectasis, pneumonia not excluded.  IMPRESSION: Cardiomegaly with mild interstitial edema.  Moderate left and small right pleural effusions, increased.  Underlying left lower lobe opacity, likely atelectasis.   Electronically Signed   By: Charline Bills M.D.   On: 05/14/2013 07:29    2D Echo 05/13/13: Study Conclusions - Left ventricle: The cavity size was mildly dilated. Systolic function was severely reduced. The estimated ejection fraction was in the range of 20% to 25%. Diffuse hypokinesis. Doppler parameters are consistent with abnormal left ventricular relaxation (grade 1 diastolic dysfunction). - Left atrium: The atrium was mildly dilated. - Right ventricle: The cavity size was mildly dilated. Wall thickness was normal. - Right atrium: The atrium was mildly dilated. - Tricuspid valve: Moderate regurgitation. - Pulmonary arteries: PA peak pressure: 39mm Hg (S). Impressions:  - Compared to the prior study, there has been no significant interval change.     Medications: I have reviewed the patient's current medications. Scheduled Meds: . aspirin EC  81 mg Oral Daily  . collagenase   Topical Daily  . digoxin  0.0625 mg Oral Daily  . enoxaparin (LOVENOX)  injection  40 mg Subcutaneous QHS  . furosemide  60 mg Intravenous BID  . insulin aspart  0-9 Units Subcutaneous TID WC  . ranolazine  500 mg Oral BID  . sodium chloride  3 mL Intravenous Q12H   Continuous Infusions:  PRN Meds:.sodium chloride, acetaminophen, ondansetron (ZOFRAN) IV, pramoxine-mineral oil-zinc, senna-docusate, sodium chloride  Assessment/Plan: Principal Problem:   Acute on chronic combined systolic and diastolic congestive heart failure, NYHA class  3 Active Problems:   LBBB (left bundle branch block)   DM, Type 2 IDDM   Ischemic cardiomyopathy, EF 25% 05/13/13   CAD, 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 She needs CABG   Hypoxemia   Falling   Hypercalcemia   Systolic and diastolic CHF, acute on chronic   History of noncompliance with medical treatment, presenting hazards to health  PLAN:  Pt's memory poor does not remember daughter arriving yesterday.   Chest pressure today.  Recheck ekg, add ntg paste for angina and afterload reduction.  Recheck troponin.  Will add zaroxolyn to meds  LOS: 3 days   Time spent with pt. :15 minutes. [facility] R  Nurse Practitioner Certified Pager [phone] 05/15/2013, 10:08 AM   Agree with note written by Nada Boozer RNP  Slow diuresis. ISCM with worsening EF, DVT, medical non compliance. Not a coumadin candidate. Exam notable for decreased BS at bases bilaterally as well as 2-3+ pitting edema. Agree with increasing IV lasix and adding zaroxolyn. Pt prob not able to take care of herself at home and would benefit from SNF. BP borderline to optimize HF meds. No CP today. Topical nitrate. Would benefit from formal CHF service consult.  Runell Gess 05/15/2013 4:50 PM

## 2013-05-15 NOTE — Progress Notes (Addendum)
[facility] PROGRESS NOTE  NERY KALISZ ZOX:[id number] DOB: [birth year] DOA: 05/12/2013 PCP: No primary provider on file.  Assessment/Plan: Acute on chronic systolic CHF/ischemic cardiomyopathy -remains quite fluid overloaded -Secondary to noncompliance with medications  -Daily weights--patient has gained 10.9 kg since 04/27/2013  - neg 1.8kg with increase of furosemide yesterday (9/21) -I.'s and Os--difficult as pt is incontinent  -Echocardiogram 05/13/2013 shows EF 20-25%  -Patient had heart catheterization in Nov. 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 Lucianne Muss)  -Part of the patient's hypercalcemia is also related to her immobility from her recent fall.  -Ionized calcium--1.48 (9/21) Diabetes mellitus type 2  -controlled -NovoLog sliding scale--change to sensitive scale  - 05/10/2013 hemoglobin A1c--7.0  Chest discomfort  -Cycle troponins--neg  -EKG shows unchanged left bundle branch block  -some improvement with Ranexa and TD nitrates Hypothermia  -improved -Urinalysis did not suggest UTI  -Blood cultures x2 sets--negative to date  - Discontinue antibiotics and observe  -Check morning cortisol--21.6 -TSH 3.014 on 04/27/13  Recurrent falls  -likely due to deconditioning  -Patient's daughter states that the patient has been essentially bedbound for the  past year with extreme weakness, but she had been resistant to going to skilled nursing facility  -PT evaluation  Dysphagia  -Nursing reports coughing with meals  -appreciate speech therapy eval  -continue regular diet  Right lower extremity wound with right popliteal occlusion  -appreciate wound care consult  Family Communication: daughter updated at beside  Disposition Plan: SNF  Antibiotics:  vancomycin 05/12/2013>>> 05/14/2013  Aztreonam 05/12/2013>>>05/14/2013      Family Communication:   Pt at beside Disposition Plan:   Home when medically stable      Procedures/Studies: Dg Chest 2 View  05/12/2013   CLINICAL DATA:  Shortness of breath, pain, fall  EXAM: CHEST  2 VIEW  COMPARISON:  04/27/2013  FINDINGS: Cardiomegaly again noted. Stable linear scarring right midlung. There is small left pleural effusion with left lower lobe atelectasis or infiltrate.  IMPRESSION: Small left pleural effusion with left lower lobe atelectasis or infiltrate. Stable scarring or atelectasis in right midlung.   Electronically Signed   By: Natasha Mead   On: 05/12/2013 12:09   Dg Chest 2 View  04/27/2013   *RADIOLOGY REPORT*  Clinical Data: Fall.  CHEST - 2 VIEW  Comparison: 01/05/2013.  Findings: Chronic cardiopericardial enlargement.  Similar to prior, low volume lungs with band-like opacities. Prominent perihilar vessels. Small pleural effusions.  No overt edema in the upper lungs.  No evidence of acute fracture.  IMPRESSION: 1. Low volume lungs with basilar atelectasis and pleural fluid. 2.  Pulmonary venous congestion.   Original Report Authenticated By: Tiburcio Pea   Ct Head Wo Contrast  05/12/2013   CLINICAL DATA:  Fall, LOC  EXAM: CT HEAD WITHOUT CONTRAST  TECHNIQUE: Contiguous axial images were obtained from the base of the skull through the vertex without intravenous contrast.  COMPARISON:  04/04/2008  FINDINGS: There is skull swelling and subcutaneous  stranding in left parietal region. There  is soft tissue swelling and subcutaneous stranding left temporal region and left periorbital preseptal region. Left face/zygomatic region soft tissue swelling and subcutaneous stranding.  No intracranial hemorrhage, mass effect or midline shift. No paranasal sinuses air-fluid levels. No intraventricular hemorrhage. Mild cerebral atrophy. Mild periventricular white matter decreased attenuation probable due to chronic small vessel ischemic changes.  IMPRESSION: 1. There is scalp swelling and subcutaneous stranding left parietal region left temporal region left face and left periorbital. 2. No skull fracture. No intracranial hemorrhage, mass effect or midline shift. 3. Mild cerebral atrophy. Mild periventricular white matter decreased attenuation probable due to chronic small vessel ischemic changes.   Electronically Signed   By: Natasha Mead   On: 05/12/2013 12:45   Dg Chest Port 1 View  05/14/2013   CLINICAL DATA:  Pleural effusion  EXAM: PORTABLE CHEST - 1 VIEW  COMPARISON:  05/12/2013  FINDINGS: Cardiomegaly with mild interstitial edema and moderate left pleural effusion, increased.  Suspected small left pleural effusion, new, with trace fluid along the right fissure.  Underlying left lower lobe opacity, likely atelectasis, pneumonia not excluded.  IMPRESSION: Cardiomegaly with mild interstitial edema.  Moderate left and small right pleural effusions, increased.  Underlying left lower lobe opacity, likely atelectasis.   Electronically Signed   By: Charline Bills M.D.   On: 05/14/2013 07:29         Subjective: This fevers, chills, nausea, vomiting, diarrhea. She still has some intermittent chest discomfort which is low but better with Ranexa and transdermal nitrates. Breathing a little bit better but still has shortness of breath with minimal exertion. No abdominal pain or diarrhea.  Objective: Filed Vitals:   05/15/13 0800 05/15/13 1000 05/15/13 1100 05/15/13 1200  BP: 103/55 109/61 101/56    Pulse: 87 86 82   Temp: 97.6 F (36.4 C)   97.6 F (36.4 C)  TempSrc: Oral   Oral  Resp: 23 22 24    Height:      Weight:      SpO2: 92% 95% 95%     Intake/Output Summary (Last 24 hours) at 05/15/13 1418 Last data filed at 05/15/13 0700  Gross per 24 hour  Intake    360 ml  Output    800 ml  Net   -440 ml   Weight change: -1.8 kg (-3 lb 15.5 oz) Exam:   General:  Pt is alert, follows commands appropriately, not in acute distress  HEENT: No icterus, No thrush,  [place]/AT  Cardiovascular: RRR, S1/S2, no rubs, no gallops  Respiratory: Bibasilar crackles. No wheezes good air movement.  Abdomen: Soft/+BS, mild RLQ tender without rebound, non distended, no guarding  Extremities: 2+ edema, No lymphangitis, No petechiae, No rashes, no synovitis  Data Reviewed: Basic Metabolic Panel:  Recent Labs Lab 05/09/13 0343 05/12/13 1055 05/13/13 0347 05/14/13 0354 05/15/13 0355  NA 134* 137 138 137 138  K 4.1 4.4 3.9 3.4* 3.6  CL 104 106 107 106 106  CO2 22 19 21 24 25   GLUCOSE 157* 87 125* 106* 102*  BUN 42* 48* 49* 44* 43*  CREATININE 1.05 0.89 0.92 0.86 0.90  CALCIUM 10.8* 11.4* 10.6* 10.2 10.0   Liver Function Tests:  Recent Labs Lab 05/12/13 1055  AST 32  ALT 29  ALKPHOS 257*  BILITOT 1.1  PROT 6.7  ALBUMIN 2.1*   No results found for this basename: LIPASE, AMYLASE,  in the last 168 hours No results found for this basename: AMMONIA,  in the last 168 hours CBC:  Recent Labs Lab 05/12/13 1055 05/13/13 0347 05/14/13 0354  WBC 8.6 7.5 5.4  NEUTROABS 6.5  --   --   HGB 14.1 12.5 12.6  HCT 43.2 38.6 39.8  MCV 83.6 84.6 85.4  PLT 229 209 196   Cardiac Enzymes:  Recent Labs Lab 05/12/13 1055 05/12/13 1633 05/12/13 2215 05/15/13 1100  CKTOTAL 116  --   --   --   TROPONINI  --  <0.30 <0.30 <0.30   BNP: No components found with this basename: POCBNP,  CBG:  Recent Labs Lab 05/14/13 0916 05/14/13 1252 05/14/13 1655 05/15/13 0755 05/15/13 1233   GLUCAP 141* 164* 118* 101* 128*    Recent Results (from the past 240 hour(s))  URINE CULTURE     Status: None   Collection Time    05/12/13 10:57 AM      Result Value Range Status   Specimen Description URINE, CATHETERIZED   Final   Special Requests NONE   Final   Culture  Setup Time     Final   Value: 05/12/2013 14:43     Performed at [facility] Count     Final   Value: 6,000 COLONIES/ML     Performed at [facility]   Culture     Final   Value: INSIGNIFICANT GROWTH     Performed at [facility]   Report Status 05/13/2013 FINAL   Final  MRSA PCR SCREENING     Status: None   Collection Time    05/12/13  3:51 PM      Result Value Range Status   MRSA by PCR NEGATIVE  NEGATIVE Final   Comment:            The GeneXpert MRSA Assay (FDA     approved for NASAL specimens     only), is one component of a     comprehensive MRSA colonization     surveillance program. It is not     intended to diagnose MRSA     infection nor to guide or     monitor treatment for     MRSA infections.  CULTURE, BLOOD (ROUTINE X 2)     Status: None   Collection Time    05/12/13  4:15 PM      Result Value Range Status   Specimen Description BLOOD RIGHT HAND   Final   Special Requests BOTTLES DRAWN AEROBIC AND ANAEROBIC 10CC   Final   Culture  Setup Time     Final   Value: 05/12/2013 20:24     Performed at [facility]   Culture     Final   Value:        BLOOD CULTURE RECEIVED NO GROWTH TO DATE CULTURE WILL BE HELD FOR 5 DAYS BEFORE ISSUING A FINAL NEGATIVE REPORT     Performed at [facility]   Report Status PENDING   Incomplete  CULTURE, BLOOD (ROUTINE X 2)     Status: None   Collection Time    05/12/13  4:30 PM      Result Value Range Status   Specimen Description BLOOD RIGHT ARM   Final   Special Requests BOTTLES DRAWN AEROBIC ONLY 10CC   Final   Culture  Setup Time     Final   Value: 05/12/2013 20:24     Performed at [facility]    Culture     Final  Value:        BLOOD CULTURE RECEIVED NO GROWTH TO DATE CULTURE WILL BE HELD FOR 5 DAYS BEFORE ISSUING A FINAL NEGATIVE REPORT     Performed at [facility]   Report Status PENDING   Incomplete     Scheduled Meds: . aspirin EC  81 mg Oral Daily  . collagenase   Topical Daily  . digoxin  0.0625 mg Oral Daily  . enoxaparin (LOVENOX) injection  40 mg Subcutaneous QHS  . furosemide  60 mg Intravenous BID  . insulin aspart  0-9 Units Subcutaneous TID WC  . metolazone  5 mg Oral QAC breakfast  . nitroGLYCERIN  1 inch Topical Q8H  . ranolazine  500 mg Oral BID  . sodium chloride  3 mL Intravenous Q12H   Continuous Infusions:    Ivett Luebbe, DO  [facility] Pager [phone]  If 7PM-7AM, please contact night-coverage [email or web address] Password [email or web address] 05/15/2013, 2:18 PM   LOS: 3 days

## 2013-05-15 NOTE — Progress Notes (Signed)
05/15/13 MMcGibboney, RN, BSN A call from [facility] rep Corrie Dandy, request this note to be placed in chart.  On 05/11/13 Mary from [facility] asked if pt went home because their RN had been out to pt's home twice, [facility] called several times to the pt's home with no answer. Mary from [facility] states that she spoke with the pt's daughter who did not know pt was discharged home. Daughter instructed [facility] to call pt's friend Lars Mage to ask if pt went  home.  This CM called  MR. [facility] who revealed that the pt was at home. Daughter and [facility] RN went to pt's home to check on pt.

## 2013-05-15 NOTE — Consult Note (Signed)
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 There is stable eschar (unstageable) to the right heel.  Last seen by our department on 05/01/13. Wound type:arterial insufficiency, chronic non-healing surgical wound.  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 Pressure Ulcer POA: Yes Measurement: Right heel eschar measures 4cm x 5cm.  SUrgical wound measures 4cm x 3cm x .5cm. Wound WUJ:WJXB pink with 25% stringy yellow slough present on surgical sound Drainage (amount, consistency, odor) small amount serous exudate on old dressing Periwound:intact, pale Dressing procedure/placement/frequency:I will pain the stable eschar on the right heel with a betadine swab and hope to keep it dry, stable and infection-free.  I will begin a silver hydrofiber to the incision site in hopes to add an antimicrobial donation to the non-healing wound, absorb any exudate and reduce dressing changes to every M-W-and Friday. A Prevalon pressure redistribution boot seen by my partner on 05/01/13 is not here at this time, so I have ordered another.  Patient is reluctant to turn (needs much encouragement from nursing staff) and is not eating very well at the moment.  Perhaps consider a nutritional consult if this does not improve. WOC nursing team will not follow, but will remain available to this patient, the nursing and medical team.  Please re-consult if needed. Thanks, Ladona Mow, MSN, RN, GNP, [place], CWON-AP [phone])

## 2013-05-15 NOTE — Progress Notes (Signed)
Speech Language Pathology Dysphagia Treatment Patient Details Name: [name] [name] MRN: [id number] DOB: 1945-11-23 Today's Date: 05/15/2013 Time: 5284-1324 SLP Time Calculation (min): 10 min  Assessment / Plan / Recommendation Clinical Impression  Pt SLP visit to assess tolerance of po diet given report of pt coughing during intake.  Pt observed feeding herself left over grapes and water via straw=no s/s of aspiration noted.  Timely mastication with clear voice throughout.  She does admit to issues with swallowing for first few days of admit that she states this has resolved.  SLP educated to pt aspiraiton precautions given she did admit to occasional dyspnea with po intake.  Used teach back for comprehension with success for rest to decr episodic aspiration events.  Pt states swallowing ability is near baseline function, she carries dx of GERD but denies this to be an issue.    All education completed, no further SLP warranted as pt educated.  Thanks    Diet Recommendation  Continue with Current Diet: Regular;Thin liquid    SLP Plan All goals met   Pertinent Vitals/Pain Afebrile, decreased   Swallowing Goals  SLP Swallowing Goals Patient will consume recommended diet without observed clinical signs of aspiration with: Minimal assistance Swallow Study Goal #1 - Progress: Met  General Temperature Spikes Noted: No Respiratory Status: Supplemental O2 delivered via (comment) Behavior/Cognition: Alert;Pleasant mood;Confused Oral Cavity - Dentition: Adequate natural dentition;Dentures, top Patient Positioning: Upright in bed  Oral Cavity - Oral Hygiene Does patient have any of the following "at risk" factors?: Oxygen therapy - cannula, mask, simple oxygen devices Patient is HIGH RISK - Oral Care Protocol followed (see row info): Yes Patient is AT RISK - Oral Care Protocol followed (see row info): Yes   Dysphagia Treatment Treatment focused on: Skilled observation of diet  tolerance;Patient/family/caregiver education Family/Caregiver Educated: pt Treatment Methods/Modalities: Skilled observation;Differential diagnosis Patient observed directly with PO's: Yes Type of PO's observed: Regular;Thin liquids Feeding: Needs set up Liquids provided via: Straw Type of cueing: Verbal Amount of cueing: Minimal   GO     Donavan Burnet, MS [facility] SLP [phone]

## 2013-05-15 NOTE — Progress Notes (Signed)
PT Cancellation Note  Patient Details Name: [name] [name] MRN: [id number] DOB: 1946/06/17   Cancelled Treatment:    Reason Eval/Treat Not Completed: Medical issues which prohibited therapy (chest pain)will check back this PM as tme permits.    Rada Hay 05/15/2013, 11:20 AM Blanchard Kelch PT [phone]

## 2013-05-16 DIAGNOSIS — I5042 Chronic combined systolic (congestive) and diastolic (congestive) heart failure: Secondary | ICD-10-CM

## 2013-05-16 LAB — GLUCOSE, CAPILLARY
Glucose-Capillary: 118 mg/dL — ABNORMAL HIGH (ref 70–99)
Glucose-Capillary: 128 mg/dL — ABNORMAL HIGH (ref 70–99)
Glucose-Capillary: 117 mg/dL — ABNORMAL HIGH (ref 70–99)
Glucose-Capillary: 96 mg/dL (ref 70–99)

## 2013-05-16 LAB — TROPONIN I
Troponin I: <0.3 ng/mL (ref <0.30)
Troponin I: <0.3 ng/mL (ref <0.30)
Troponin I: <0.3 ng/mL (ref <0.30)

## 2013-05-16 LAB — BASIC METABOLIC PANEL
BUN: 40 mg/dL — ABNORMAL HIGH (ref 6–23)
Calcium: 10.1 mg/dL (ref 8.4–10.5)
GFR calc Af Amer: 68 mL/min — ABNORMAL LOW (ref >90)
GFR calc non Af Amer: 59 mL/min — ABNORMAL LOW (ref >90)
Potassium: 3.8 mEq/L (ref 3.5–5.1)
Sodium: 137 mEq/L (ref 135–145)

## 2013-05-16 NOTE — Progress Notes (Signed)
PT Cancellation Note  Patient Details Name: [name] [name] MRN: [id number] DOB: [birth year]   Cancelled Treatment:    Reason Eval/Treat Not Completed: Patient declined,  (Stated : "I do not want PT. I will sit up in the bed.")   Rada Hay 05/16/2013, 9:43 AM Blanchard Kelch PT [phone]

## 2013-05-16 NOTE — Progress Notes (Addendum)
[facility] PROGRESS NOTE  CARMENCITA CUSIC WGN:[id number] DOB: [birth year] DOA: 05/12/2013 PCP: No primary provider on file.  Brief history  67 year old female with a history of ischemic dilated cardiomyopathy, right popliteal artery occlusion, and DVT previously treated with Coumadin, diabetes mellitus, hyperparathyroidism who presented to the emergency department after a mechanical fall at home.  Recently discharged from [facility] on 05/09/2013 after a 2 week stay for UTI and recurrent falls. The patient did not taken any of her medications and since she was discharged from the hospital including her furosemide, imdur, and aspirin. The patient was noted with acute on chronic systolic CHF with proBNP 30,492. She was hypoxemic and hypothermic in ED. Cultures were done and the patient was started on empiric antibiotics which were discontinued after the patient clinically improved and cultures were negative. The patient was diuresed, but this has been limited due to the patient's soft blood pressures. The patient's existing cardiologist was consulted [facility]). They recommended transfer to [facility] for  Consult with Heart Failure team.  Assessment/Plan: Acute on chronic systolic CHF/ischemic cardiomyopathy  -remains quite fluid overloaded  -Secondary to noncompliance with medications  -Daily weights--patient had gained 10.9 kg since 04/27/2013  - neg 3.6kg since admission, but soft BP has limited aggressive diuresis -I.'s and Os--difficult as pt is incontinent  -Echocardiogram 05/13/2013 shows EF 20-25%  -Patient had heart catheterization in Nov. 2013 which showed Severe Ischemic Cardiomyopathy with 100% mid occlusion of the RCA and subtotal occlusion of both the circumflex and the LAD with 95+ % lesions with multiple other significant lesions in his vessels-->seen by Dr. Donata Clay in Nov for possible CABG, but deferred due to acute DVT and cellulitis of leg at that time -venous duplex LE--negative DVT  -  agree with zaroxolyn; may need to continue to push up IV lasix dose  -Appreciate cardiology evaluation--continue digoxin, Ranexa--may need dobutamine/milrinone  -add aldactone if BP tolerates -previously noncompliant with lifevest - spoke with cardiology-->transfer to [facility] for heart failure team consult Primary hyperparathyroidism with hypercalcemia  - Discontinued calcitonin--had 48 hrs  -Continue furosemide IV  -Patient has missed an outpatient endocrine appt (Dr. Lucianne Muss)  -Part of the patient's hypercalcemia is also related to her immobility from her recent fall.  -Ionized calcium--1.48 (9/21)-->1.40 (9/23)  Diabetes mellitus type 2  -controlled  -NovoLog sliding scale--change to sensitive scale  - 05/10/2013 hemoglobin A1c--7.0  Chest discomfort  -Cycle troponins--neg  -EKG shows unchanged left bundle branch block  -some improvement with Ranexa and TD nitrates  Hypothermia  -improved  -Urinalysis did not suggest UTI  -Blood cultures x2 sets--negative to date  - Discontinued empiric antibiotics and observe  -Check morning cortisol--21.6  -TSH 3.014 on 04/27/13  Recurrent falls  -likely due to deconditioning  -Patient's daughter states that the patient has been essentially bedbound for the past year with extreme weakness, but she had been resistant to going to skilled nursing facility  -PT evaluation  Dysphagia  -Nursing reports coughing with meals  -appreciate speech therapy eval  -continue regular diet  Hx of R-popliteal DVT -diagnosed 06/22/12 -noncompliant with coumadin -repeat doppler 05/13/13--neg for DVT -not a candidate for anticoag due to falls and noncompliance Right lower extremity wound with right popliteal occlusion  -appreciate wound care consult  -noncompliant with f/u with vascular surgery--previously did not want surgery Family Communication: daughter updated at beside  Disposition Plan: SNF  Antibiotics:  vancomycin 05/12/2013>>> 05/14/2013  Aztreonam  05/12/2013>>>05/14/2013           Procedures/Studies: Dg Chest  2 View  05/12/2013   CLINICAL DATA:  Shortness of breath, pain, fall  EXAM: CHEST  2 VIEW  COMPARISON:  04/27/2013  FINDINGS: Cardiomegaly again noted. Stable linear scarring right midlung. There is small left pleural effusion with left lower lobe atelectasis or infiltrate.  IMPRESSION: Small left pleural effusion with left lower lobe atelectasis or infiltrate. Stable scarring or atelectasis in right midlung.   Electronically Signed   By: Natasha Mead   On: 05/12/2013 12:09   Dg Chest 2 View  04/27/2013   *RADIOLOGY REPORT*  Clinical Data: Fall.  CHEST - 2 VIEW  Comparison: 01/05/2013.  Findings: Chronic cardiopericardial enlargement.  Similar to prior, low volume lungs with band-like opacities. Prominent perihilar vessels. Small pleural effusions.  No overt edema in the upper lungs.  No evidence of acute fracture.  IMPRESSION: 1. Low volume lungs with basilar atelectasis and pleural fluid. 2.  Pulmonary venous congestion.   Original Report Authenticated By: Tiburcio Pea   Ct Head Wo Contrast  05/12/2013   CLINICAL DATA:  Fall, LOC  EXAM: CT HEAD WITHOUT CONTRAST  TECHNIQUE: Contiguous axial images were obtained from the base of the skull through the vertex without intravenous contrast.  COMPARISON:  04/04/2008  FINDINGS: There is skull swelling and subcutaneous stranding in left parietal region. There is soft tissue swelling and subcutaneous stranding left temporal region and left periorbital preseptal region. Left face/zygomatic region soft tissue swelling and subcutaneous stranding.  No intracranial hemorrhage, mass effect or midline shift. No paranasal sinuses air-fluid levels. No intraventricular hemorrhage. Mild cerebral atrophy. Mild periventricular white matter decreased attenuation probable due to chronic small vessel ischemic changes.  IMPRESSION: 1. There is scalp swelling and subcutaneous stranding left parietal region left  temporal region left face and left periorbital. 2. No skull fracture. No intracranial hemorrhage, mass effect or midline shift. 3. Mild cerebral atrophy. Mild periventricular white matter decreased attenuation probable due to chronic small vessel ischemic changes.   Electronically Signed   By: Natasha Mead   On: 05/12/2013 12:45   Dg Chest Port 1 View  05/14/2013   CLINICAL DATA:  Pleural effusion  EXAM: PORTABLE CHEST - 1 VIEW  COMPARISON:  05/12/2013  FINDINGS: Cardiomegaly with mild interstitial edema and moderate left pleural effusion, increased.  Suspected small left pleural effusion, new, with trace fluid along the right fissure.  Underlying left lower lobe opacity, likely atelectasis, pneumonia not excluded.  IMPRESSION: Cardiomegaly with mild interstitial edema.  Moderate left and small right pleural effusions, increased.  Underlying left lower lobe opacity, likely atelectasis.   Electronically Signed   By: Charline Bills M.D.   On: 05/14/2013 07:29         Subjective: Patient states the chest discomfort has been improved since the start of transdermal nitrate. Patient currently denies fevers, chills, nausea, vomiting, diarrhea, abdominal pain, dysuria. No dizziness or headache.  Objective: Filed Vitals:   05/16/13 0400 05/16/13 0500 05/16/13 0800 05/16/13 0900  BP: 96/75  108/60   Pulse: 73  75 80  Temp: 97.5 F (36.4 C)  97.5 F (36.4 C)   TempSrc: Oral  Oral   Resp: 16  21 17   Height:      Weight:  83.6 kg (184 lb 4.9 oz)    SpO2: 96%  95% 96%    Intake/Output Summary (Last 24 hours) at 05/16/13 1210 Last data filed at 05/16/13 0440  Gross per 24 hour  Intake    123 ml  Output   1500 ml  Net  -1377 ml   Weight change: -1.8 kg (-3 lb 15.5 oz) Exam:   General:  Pt is alert, follows commands appropriately, not in acute distress  HEENT: No icterus, No thrush,  [place]/AT  Cardiovascular: RRR, S1/S2, no rubs, no gallops  Respiratory: Diminished breath sounds at the  bases. Bibasilar crackles. No wheezing.  Abdomen: Soft/+BS, non tender, non distended, no guarding  Extremities: 2+ LE edema, No lymphangitis, No petechiae, No rashes, no synovitis  Data Reviewed: Basic Metabolic Panel:  Recent Labs Lab 05/12/13 1055 05/13/13 0347 05/14/13 0354 05/15/13 0355 05/16/13 0449  NA 137 138 137 138 137  K 4.4 3.9 3.4* 3.6 3.8  CL 106 107 106 106 101  CO2 19 21 24 25 28   GLUCOSE 87 125* 106* 102* 88  BUN 48* 49* 44* 43* 40*  CREATININE 0.89 0.92 0.86 0.90 0.98  CALCIUM 11.4* 10.6* 10.2 10.0 10.1   Liver Function Tests:  Recent Labs Lab 05/12/13 1055  AST 32  ALT 29  ALKPHOS 257*  BILITOT 1.1  PROT 6.7  ALBUMIN 2.1*   No results found for this basename: LIPASE, AMYLASE,  in the last 168 hours No results found for this basename: AMMONIA,  in the last 168 hours CBC:  Recent Labs Lab 05/12/13 1055 05/13/13 0347 05/14/13 0354  WBC 8.6 7.5 5.4  NEUTROABS 6.5  --   --   HGB 14.1 12.5 12.6  HCT 43.2 38.6 39.8  MCV 83.6 84.6 85.4  PLT 229 209 196   Cardiac Enzymes:  Recent Labs Lab 05/12/13 1055  05/15/13 1100 05/15/13 1636 05/15/13 2205 05/16/13 0449 05/16/13 1015  CKTOTAL 116  --   --   --   --   --   --   TROPONINI  --   < > <0.30 <0.30 <0.30 <0.30 <0.30  < > = values in this interval not displayed. BNP: No components found with this basename: POCBNP,  CBG:  Recent Labs Lab 05/14/13 1655 05/15/13 0755 05/15/13 1233 05/15/13 1616 05/16/13 0816  GLUCAP 118* 101* 128* 110* 86    Recent Results (from the past 240 hour(s))  URINE CULTURE     Status: None   Collection Time    05/12/13 10:57 AM      Result Value Range Status   Specimen Description URINE, CATHETERIZED   Final   Special Requests NONE   Final   Culture  Setup Time     Final   Value: 05/12/2013 14:43     Performed at [facility] Count     Final   Value: 6,000 COLONIES/ML     Performed at [facility]   Culture     Final    Value: INSIGNIFICANT GROWTH     Performed at [facility]   Report Status 05/13/2013 FINAL   Final  MRSA PCR SCREENING     Status: None   Collection Time    05/12/13  3:51 PM      Result Value Range Status   MRSA by PCR NEGATIVE  NEGATIVE Final   Comment:            The GeneXpert MRSA Assay (FDA     approved for NASAL specimens     only), is one component of a     comprehensive MRSA colonization     surveillance program. It is not     intended to diagnose MRSA     infection nor to guide or  monitor treatment for     MRSA infections.  CULTURE, BLOOD (ROUTINE X 2)     Status: None   Collection Time    05/12/13  4:15 PM      Result Value Range Status   Specimen Description BLOOD RIGHT HAND   Final   Special Requests BOTTLES DRAWN AEROBIC AND ANAEROBIC 10CC   Final   Culture  Setup Time     Final   Value: 05/12/2013 20:24     Performed at [facility]   Culture     Final   Value:        BLOOD CULTURE RECEIVED NO GROWTH TO DATE CULTURE WILL BE HELD FOR 5 DAYS BEFORE ISSUING A FINAL NEGATIVE REPORT     Performed at [facility]   Report Status PENDING   Incomplete  CULTURE, BLOOD (ROUTINE X 2)     Status: None   Collection Time    05/12/13  4:30 PM      Result Value Range Status   Specimen Description BLOOD RIGHT ARM   Final   Special Requests BOTTLES DRAWN AEROBIC ONLY 10CC   Final   Culture  Setup Time     Final   Value: 05/12/2013 20:24     Performed at [facility]   Culture     Final   Value:        BLOOD CULTURE RECEIVED NO GROWTH TO DATE CULTURE WILL BE HELD FOR 5 DAYS BEFORE ISSUING A FINAL NEGATIVE REPORT     Performed at [facility]   Report Status PENDING   Incomplete     Scheduled Meds: . aspirin EC  81 mg Oral Daily  . collagenase   Topical Daily  . digoxin  0.0625 mg Oral Daily  . enoxaparin (LOVENOX) injection  40 mg Subcutaneous QHS  . furosemide  60 mg Intravenous BID  . insulin aspart  0-9 Units Subcutaneous  TID WC  . metolazone  5 mg Oral QAC breakfast  . nitroGLYCERIN  1 inch Topical Q8H  . ranolazine  500 mg Oral BID  . sodium chloride  3 mL Intravenous Q12H   Continuous Infusions:    Analycia Khokhar, DO  [facility] Pager [phone]  If 7PM-7AM, please contact night-coverage [email or web address] Password [email or web address] 05/16/2013, 12:10 PM   LOS: 4 days

## 2013-05-17 DIAGNOSIS — J9600 Acute respiratory failure, unspecified whether with hypoxia or hypercapnia: Secondary | ICD-10-CM

## 2013-05-17 DIAGNOSIS — W19XXXA Unspecified fall, initial encounter: Secondary | ICD-10-CM

## 2013-05-17 DIAGNOSIS — I2510 Atherosclerotic heart disease of native coronary artery without angina pectoris: Secondary | ICD-10-CM

## 2013-05-17 LAB — TROPONIN I: Troponin I: <0.3 ng/mL (ref <0.30)

## 2013-05-17 MED ORDER — FUROSEMIDE 10 MG/ML IJ SOLN
10.0000 mg/h | INTRAVENOUS | Status: DC
  Administered 2013-05-17 – 2013-05-19 (×2): 10 mg/h via INTRAVENOUS
  Filled 2013-05-17 (×9): qty 25

## 2013-05-17 MED ORDER — POTASSIUM CHLORIDE CRYS ER 20 MEQ PO TBCR
20.0000 meq | EXTENDED_RELEASE_TABLET | Freq: Two times a day (BID) | ORAL | Status: DC
  Administered 2013-05-17 – 2013-05-22 (×11): 20 meq via ORAL
  Filled 2013-05-17 (×11): qty 1

## 2013-05-17 MED ORDER — ACETAMINOPHEN 325 MG PO TABS
650.0000 mg | ORAL_TABLET | ORAL | Status: DC | PRN
Start: 2013-05-17 — End: 2013-05-22
  Administered 2013-05-18 – 2013-05-22 (×5): 650 mg via ORAL
  Filled 2013-05-17 (×5): qty 2

## 2013-05-17 MED ORDER — ATORVASTATIN CALCIUM 40 MG PO TABS
40.0000 mg | ORAL_TABLET | Freq: Every day | ORAL | Status: DC
  Administered 2013-05-17 – 2013-05-21 (×5): 40 mg via ORAL
  Filled 2013-05-17 (×7): qty 1

## 2013-05-17 NOTE — Consult Note (Signed)
Advanced Heart Failure Team Consult Note  Referring Physician: Dr Tat Primary Cardiologist:  [facility]  Reason for Consultation: Heart Failure  HPI:   The Heart Failure team was asked to provide a consult by Dr Tat for heart failure management.   Mrs Diluzio is a 67 year old with history of ICM, chronic systolic heart failure EF 20-25% 09/2012, diffuse CAD cath 06/2012 100% mid occlusion of the RCA and subtotal occlusion of both the circumflex and the LAD, diffuse PAD S/P R transmetatarsal amputation, DM2 and DVT previously treated with Coumadin but noncompliant.  She has previously been referred to CVTS and [facility] but she did not f/u with them or with [facility] as an outpatient.   May 2014 admitted for foot amputation. Discharged to [facility].  On 04/17/13 discharged to home. On 9/3 to 9/16 admitted with fall and Proteus UTI. Refused multiple things including PT.   She presented  to [facility] emergency department after a fall at home. She had been on the floor for 2 days until a neighbor found her. In the ED she was hypoxic with O2 sats 75%, temp 92.9. on room air. CT of head - no acute finding ins. CXR with L pleural effusion with LLL infiltrate and cardiomegaly. Started on 40 mg of  IV lasix every 12 hours. Admit pro BNP 30,000, CPK 116 CEs negative.    She has been seen by [facility] team and diuretic regimen increased to 60 mg lasix every 12 hours and metolazone. Weight down 11 pounds. 191->180 (baseline weight difficult to ascertain from previous records - on discharge in May was 170 pounds). Breathing better. Renal function stable.  Says she currently lives alone at home and neighbors come to check on her. Wheelchair bound. Refuses to go back to SNF but considering AL. Says she doesn't trust doctors.  Doppler 05/13/13 negative for clot   Review of Systems: [y] = yes, [ ]  = no   General: Weight gain [place].Etienne ]; Weight loss [ ] ; Anorexia [ ] ; Fatigue [Y ]; Fever [ ] ; Chills [ ] ; Weakness [ ]   Cardiac: Chest  pain/pressure [ ] ; Resting SOB [Y ]; Exertional SOB [Y ]; Orthopnea Gilian.Kraft ]; Pedal Edema [Y ]; Palpitations [ ] ; Syncope [ ] ; Presyncope [ ] ; Paroxysmal nocturnal dyspnea[ ]   Pulmonary: Cough [ Y]; Wheezing[ ] ; Hemoptysis[ ] ; Sputum [ ] ; Snoring [ ]   GI: Vomiting[ ] ; Dysphagia[ ] ; Melena[ ] ; Hematochezia [ ] ; Heartburn[ ] ; Abdominal pain [ ] ; Constipation [ ] ; Diarrhea [ ] ; BRBPR [ ]   GU: Hematuria[ ] ; Dysuria [ ] ; Nocturia[ ]   Vascular: Pain in legs with walking [ ] ; Pain in feet with lying flat [ ] ; Non-healing sores [ ] ; Stroke [ ] ; TIA [ ] ; Slurred speech [ ] ;  Neuro: Headaches[ ] ; Vertigo[ ] ; Seizures[ ] ; Paresthesias[ ] ;Blurred vision [ ] ; Diplopia [ ] ; Vision changes [ ]   Ortho/Skin: Arthritis [ ] ; Joint pain [ ] ; Muscle pain [ ] ; Joint swelling [ ] ; Back Pain [Y ]; Rash [ ]   Psych: Depression[ ] ; Anxiety[ ]   Heme: Bleeding problems [ ] ; Clotting disorders [ ] ; Anemia [ ]   Endocrine: Diabetes [ Y]; Thyroid dysfunction[ ]   Home Medications Prior to Admission medications   Medication Sig Start Date End Date Taking? Authorizing Provider  aspirin EC 81 MG EC tablet Take 1 tablet (81 mg total) by mouth daily. 07/09/12  Yes Luke K Kilroy, PA-C  collagenase (SANTYL) ointment Apply topically daily. Apply to right foot surgical site.  Apply 1/8 inch thickness to slough in wound bed for debridement. Top with NS moist dressing, dry gauze and wrap gauze (kerlix).  Secure with tape. Use for 2 weeks. 05/09/13  Yes Rodolph Bong, MD  furosemide (LASIX) 40 MG tablet Take 1 tablet (40 mg total) by mouth daily. 05/09/13  Yes Rodolph Bong, MD  isosorbide mononitrate (IMDUR) 30 MG 24 hr tablet Take 30 mg by mouth daily as needed (for weakness).   Yes Historical Provider, MD  metFORMIN (GLUCOPHAGE) 500 MG tablet Take 500 mg by mouth 2 (two) times daily with a meal.   Yes Historical Provider, MD  Multiple Vitamin (MULTIVITAMIN) LIQD Take 5 mLs by mouth daily.   Yes Historical Provider, MD  oxyCODONE (OXY  IR/ROXICODONE) 5 MG immediate release tablet Take 1 tablet (5 mg total) by mouth every 4 (four) hours as needed for pain. 05/09/13  Yes Rodolph Bong, MD    Past Medical History: Past Medical History  Diagnosis Date  . 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 DVT, bilateral lower limbs 05/2012  . 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 GERD (gastroesophageal reflux disease)   . History of blood transfusion   . Coronary artery disease     Severe Multivessel Disase; 3 V Disease wtih LAD distribution Viability &  inferior scar.  . Numbness in feet 06/29/12    Right worse than left  . DVT (deep venous thrombosis) 06/29/12  . CHF (congestive heart failure)   . Venous embolism and thrombosis 01/09/13  . Diabetes mellitus type 2, uncontrolled, with complications 01/09/13    Very poorly controlled  . Coronary atherosclerosis of unspecified type of vessel, native or graft   . Blood transfusion, without reported diagnosis 01/09/13  . Gangrene of foot 10/16/12    Right     Past Surgical History: Past Surgical History  Procedure Laterality Date  . Vaginal hysterectomy  ~ 1980  . Cesarean section  1979  . Lumbar disc surgery  1980's  . Facial reconstruction surgery    . Cardiac catheterization    . Amputation Right 10/16/2012    Procedure: AMPUTATION FOOT;  Surgeon: Kathryne Hitch, MD;  Location: [facility] ORS;  Service: Orthopedics;  Laterality: Right;  transmetatarsal amp  . I&d extremity Right 01/05/2013    Procedure: I & D Right foot, Revision Transmet Amputation;  Surgeon: Kathryne Hitch, MD;  Location: [facility] ORS;  Service: Orthopedics;  Laterality: Right;  . Spine surgery  1980's    Lumbar disc    Family History: Family History  Problem Relation Age of Onset  . Cancer Mother   . CAD Mother   . Heart disease Mother     Heart Disease before age 13  . Hyperlipidemia Mother   . Hypertension  Mother   . Heart attack Mother   . Cancer Father   . CAD Father     Social History: History   Social History  . Marital Status: Widowed    Spouse Name: N/A    Number of Children: N/A  . Years of Education: N/A   Social History Main Topics  . Smoking status: Former Smoker -- 0.50 packs/day for 20 years    Types: Cigarettes  . Smokeless tobacco: Never Used     Comment: 07/06/12 "stopped smoking cigarettes years ago"  . Alcohol Use: No  . Drug Use: No  . Sexual Activity: Yes  Other Topics Concern  . None   Social History Narrative  . None    Allergies:  Allergies  Allergen Reactions  . Penicillins Anaphylaxis  . Sulfa Antibiotics Anaphylaxis  . Levofloxacin Itching    Objective:    Vital Signs:   Temp:  [97.3 F (36.3 C)-98.6 F (37 C)] 97.3 F (36.3 C) (09/24 1227) Pulse Rate:  [74-81] 76 (09/24 1227) Resp:  [12-23] 18 (09/24 1227) BP: (102-117)/(56-83) 102/64 mmHg (09/24 1227) SpO2:  [93 %-98 %] 93 % (09/24 1227) Weight:  [82 kg (180 lb 12.4 oz)] 82 kg (180 lb 12.4 oz) (09/24 0500) Last BM Date: 05/17/13  Weight change: Filed Weights   05/15/13 0700 05/16/13 0500 05/17/13 0500  Weight: 85.4 kg (188 lb 4.4 oz) 83.6 kg (184 lb 4.9 oz) 82 kg (180 lb 12.4 oz)    Intake/Output:   Intake/Output Summary (Last 24 hours) at 05/17/13 1308 Last data filed at 05/17/13 1200  Gross per 24 hour  Intake    240 ml  Output   1700 ml  Net  -1460 ml     Physical Exam: General:  Chronically ill appearing. No resp difficulty HEENT: normal Neck: supple. JVP 10 . Carotids 2+ bilat; no bruits. No lymphadenopathy or thryomegaly appreciated. Cor: PMI nondisplaced. Regular rate & rhythm. No rubs, gallops or murmurs. Porbable soft s3 Lungs: clear Abdomen: soft, nontender, nondistended. No hepatosplenomegaly. No bruits or masses. Good bowel sounds. Extremities: no cyanosis, clubbing, rash, edema RLE/ LLE 2+ edema. L > R R foot dressing in place.  LUE 2+ edema  Neuro:  alert & orientedx3, cranial nerves grossly intact. moves all 4 extremities w/o difficulty. Affect pleasant  Telemetry: SR   Labs: Basic Metabolic Panel:  Recent Labs Lab 05/12/13 1055 05/13/13 0347 05/14/13 0354 05/15/13 0355 05/16/13 0449  NA 137 138 137 138 137  K 4.4 3.9 3.4* 3.6 3.8  CL 106 107 106 106 101  CO2 19 21 24 25 28   GLUCOSE 87 125* 106* 102* 88  BUN 48* 49* 44* 43* 40*  CREATININE 0.89 0.92 0.86 0.90 0.98  CALCIUM 11.4* 10.6* 10.2 10.0 10.1    Liver Function Tests:  Recent Labs Lab 05/12/13 1055  AST 32  ALT 29  ALKPHOS 257*  BILITOT 1.1  PROT 6.7  ALBUMIN 2.1*   No results found for this basename: LIPASE, AMYLASE,  in the last 168 hours No results found for this basename: AMMONIA,  in the last 168 hours  CBC:  Recent Labs Lab 05/12/13 1055 05/13/13 0347 05/14/13 0354  WBC 8.6 7.5 5.4  NEUTROABS 6.5  --   --   HGB 14.1 12.5 12.6  HCT 43.2 38.6 39.8  MCV 83.6 84.6 85.4  PLT 229 209 196    Cardiac Enzymes:  Recent Labs Lab 05/12/13 1055  05/15/13 1636 05/15/13 2205 05/16/13 0449 05/16/13 1015 05/16/13 1638  CKTOTAL 116  --   --   --   --   --   --   TROPONINI  --   < > <0.30 <0.30 <0.30 <0.30 <0.30  < > = values in this interval not displayed.  BNP: BNP (last 3 results)  Recent Labs  10/15/12 2008 05/12/13 1055 05/16/13 0449  PROBNP 19285.0* 30492.0* 25364.0*    CBG:  Recent Labs Lab 05/16/13 0816 05/16/13 1204 05/16/13 1655 05/16/13 1951 05/16/13 2209  GLUCAP 86 118* 128* 117* 96    Coagulation Studies: No results found for this basename: LABPROT, INR,  in the last  72 hours  Other results: EKG: NSR 83 LBBB (1142 ms) +PVCc Imaging: No results found.   Medications:     Current Medications: . aspirin EC  81 mg Oral Daily  . atorvastatin  40 mg Oral q1800  . collagenase   Topical Daily  . digoxin  0.0625 mg Oral Daily  . enoxaparin (LOVENOX) injection  40 mg Subcutaneous QHS  . insulin aspart  0-9  Units Subcutaneous TID WC  . nitroGLYCERIN  1 inch Topical Q8H  . potassium chloride  20 mEq Oral BID  . ranolazine  500 mg Oral BID  . sodium chloride  3 mL Intravenous Q12H    Infusions: . furosemide (LASIX) infusion 10 mg/hr (05/17/13 1152)     Assessment:   1. A/C Systolic Heart Failure - due to ICM EF 20-25% 05/13/2013 2. Acute respiratory failure - O2 sats 75% in ED  3. Fall 4.  CAD S/P Cath 06/2012 100% mid occlusion of the RCA and subtotal occlusion of both the circumflex and the LAD with 95+ % lesions with multiple other significant lesions in his vessels 5. DM  6. LBBB 7. History of bilateral DVTs 05/2012  was on coumadin but she stopped taking . Dopplers negative 05/13/13 8. Noncompliance, severe 9. PAD - S/P R transmetatarsal amputation.   Plan/Discussion:    Ms. Wakefield is diuresing well on well current regimen. I had a long talk with her about her situation and she is adamant that she does not want aggressive follow-up and also refuses SNFdespite the fact she lives alone and is essentially immobile.  She is not candidate for CABG and obviously not a candidate for advanced therapies and will not f/u with HF Clinic.   At this point I think all there is to offer her is to get her back to her dry weight and try to get her to a facility where she can have some supervision so that she doesn't continue to bounce back. I feel strongly that there is no way she can return home safely. Best option may be Palliative Care but she does not seem receptive to this currently.  Would continue diuretics to dry to get weight down to 170 or less. BP too low to add ACE or b-blocker at this time. Would consider stopping digoxin given lack of f/u. Also consider u/s LUE to exclude DVT.  The HF team will sign off as there is little for [place] to offer at this point.    Length of Stay: 5  Malkia Nippert BensimhonMD 05/17/2013, 1:08 PM  Advanced Heart Failure Team Pager [phone] (M-F; 7a - 4p)  Please  contact [facility] for night-coverage after hours (4p -7a ) and weekends on [email or web address]

## 2013-05-17 NOTE — Care Management Note (Signed)
    Page 1 of 1   05/17/2013     10:32:25 AM   CARE MANAGEMENT NOTE 05/17/2013  Patient:  [name] [name], [name] [name]   Account Number:  [id number]  Date Initiated:  05/13/2013  Documentation initiated by:  [facility]  Subjective/Objective Assessment:   Acute on chronic systolic CHF     DC Planning Services  CM consult      Comments:  05/17/13 0924 Verdis Prime RN MSN BSN CCM Pt transferred to [facility] SDU for systolic failure mgmt.  Met @ bedside with pt and dtr, [name] [name] [phone]).  Pt and dtr very interested in SNF for rehab and/or ALF.  Pt was previously @ [facility] and used her 100 days as of 8/26.  Per pt and CHL, pt also has Medicaid.  Pt and dtr aware pt is not safe @ home alone and dtr unable to provide 24/7 care.  Noted pt declined to work with PT @ [facility] - discussed with pt who now agrees to work with PT and OT. CSW informed of above.  [id number] Earlene Plater, RN, BSN, [place] [phone] Chart Reviewed for discharge and hospital needs. Discharge needs at time of review:  Nonepatient refused to answer door or p[hone at home when [facility] attempted to start [facility].  hhc may not be an option. Review of patient progress due on [id number].   05/13/2013 1415 Pt was set up with [facility]. Waiting final dc disposition.   Isidoro Donning RN CCM Case Mgmt phone [phone]

## 2013-05-17 NOTE — Progress Notes (Signed)
PT Cancellation Note  Patient Details Name: [name] [name] MRN: [id number] DOB: 24-Jun-1946   Cancelled Treatment:    Reason Eval/Treat Not Completed: Medical issues which prohibited therapy. RN deferred at this time due to pt going on lasix drip and in a medical crisis at this time. PT to return as able.   Marcene Brawn 05/17/2013, 11:29 AM

## 2013-05-17 NOTE — Progress Notes (Signed)
NP asked to check on pt after transfer from [facility]. Pt has multiple chronic medical issues and is admitted for acute on chronic systolic CHF and ischemic cardiomyopathy. Cardiology saw pt and requested transfer to [facility] so that CHF team could follow pt.  Pt says she feels fine. Says her swelling is "going down". No SOB or resp distress. No chest pain. VSS. No issues since transfer per RN. [facility] and cardio will follow this am.  Jimmye Norman, NP [facility]

## 2013-05-17 NOTE — Progress Notes (Signed)
[facility] Progress Note [place] TEAM 1 - Stepdown/ICU TEAM   ALAIRA LEVEL ZOX:[id number] DOB: 1946-01-08 DOA: 05/12/2013 PCP: No primary provider on file.  Admit HPI / Brief Narrative: 67 year old female with a history of ischemic dilated cardiomyopathy, right popliteal artery occlusion, and DVT previously treated with Coumadin, diabetes mellitus, hyperparathyroidism who presented to the emergency department after a fall at home. According to the patient, there was no syncope. The patient laid on her floor for 2 days unable to get up until she was found by her neighbor. The patient was recently discharged from [facility] on 05/09/2013 after a 2 week stay for UTI and recurrent falls. The patient had not taken any of her medications and since she was discharged from the hospital including her furosemide, imdur, and aspirin. She complainsedof some chest discomfort with shortness of breath. Apparently, the patient had been very resistant to placement in a skilled nursing facility against the wishes of her daughter.   In ED, the patient was found to be fluid overloaded with chest x-ray suggesting left pleural effusion as well as proBNP 30,492. The patient was noted to be hypoxemic at 75% on room air. The patient was given furosemide 80 mg IV x1. BMP was unremarkable. CBC was also unremarkable. The patient's temperature was 92.9. Urinalysis did not suggest UTI. CPK was 116, and iSTAT troponin was negative. CT of the brain did not show any acute infarction, but showed diffuse swelling of the scalp as well as her face.   Assessment/Plan:  Acute on chronic systolic congestive heart failure/ischemic cardiomyopathy EF 25% February 2014 - the patient was noncompliant with medications - heart failure team consulted and managing  Diabetes mellitus type 2 CBGs well-controlled  Primary hyperparathyroidism with hypercalcemia Patient missed a scheduled outpatient endocrine evaluation - with ongoing  diuresis and calcitonin x48 hours calcium actually normal at this time - follow trend  Chest pain with history coronary artery disease x3 vessels November 2013 Patient did not followup with thoracic surgery as recommended  Hypothermia Urinalysis unrevealing - no evidence of acute infection - temperature has been normal since admission  History of B DVT October 2013 Was noncompliant with Coumadin therapy - repeat Doppler September 2014 negative for DVT - not felt to be a candidate for future anticoagulation due to noncompliance and frequent falls  Recurrent falls  Peripheral arterial disease to include 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 Wound care following - patient has been noncompliant with following up with vascular surgery since TMA  History of severe noncompliance  Code Status: FULL Family Communication: no family present at time of exam Disposition Plan: SDU  Consultants: Heart failure team Cardiology - [facility]  Procedures: None  Antibiotics: Vancomycin 9/19 >> 9/20 Aztreonam 9/19 >> 9/21  DVT prophylaxis: lovenox  HPI/Subjective: The patient continues to report intermittent shortness of breath.  She has no new complaints at the present time.  She denies current chest pain.  Objective: Blood pressure 102/64, pulse 76, temperature 97.3 F (36.3 C), temperature source Oral, resp. rate 18, height 5\' 6"  (1.676 m), weight 82 kg (180 lb 12.4 oz), SpO2 93.00%.  Intake/Output Summary (Last 24 hours) at 05/17/13 1343 Last data filed at 05/17/13 1200  Gross per 24 hour  Intake    240 ml  Output   1700 ml  Net  -1460 ml   Exam: General: No acute respiratory distress when laying in bed Lungs: Fine crackles diffusely/more prominent in bilateral bases Cardiovascular: Regular rate and rhythm without  murmur gallop or rub  Abdomen: Nontender, nondistended, soft, bowel sounds positive, no rebound, no ascites, no appreciable mass Extremities: Diffuse edema  at 3+ bilateral lower extremities and 1+ bilateral upper extremities  Data Reviewed: Basic Metabolic Panel:  Recent Labs Lab 05/12/13 1055 05/13/13 0347 05/14/13 0354 05/15/13 0355 05/16/13 0449  NA 137 138 137 138 137  K 4.4 3.9 3.4* 3.6 3.8  CL 106 107 106 106 101  CO2 19 21 24 25 28   GLUCOSE 87 125* 106* 102* 88  BUN 48* 49* 44* 43* 40*  CREATININE 0.89 0.92 0.86 0.90 0.98  CALCIUM 11.4* 10.6* 10.2 10.0 10.1   Liver Function Tests:  Recent Labs Lab 05/12/13 1055  AST 32  ALT 29  ALKPHOS 257*  BILITOT 1.1  PROT 6.7  ALBUMIN 2.1*   CBC:  Recent Labs Lab 05/12/13 1055 05/13/13 0347 05/14/13 0354  WBC 8.6 7.5 5.4  NEUTROABS 6.5  --   --   HGB 14.1 12.5 12.6  HCT 43.2 38.6 39.8  MCV 83.6 84.6 85.4  PLT 229 209 196   Cardiac Enzymes:  Recent Labs Lab 05/12/13 1055  05/15/13 1636 05/15/13 2205 05/16/13 0449 05/16/13 1015 05/16/13 1638  CKTOTAL 116  --   --   --   --   --   --   TROPONINI  --   < > <0.30 <0.30 <0.30 <0.30 <0.30  < > = values in this interval not displayed. BNP (last 3 results)  Recent Labs  10/15/12 2008 05/12/13 1055 05/16/13 0449  PROBNP 19285.0* 30492.0* 25364.0*   CBG:  Recent Labs Lab 05/16/13 0816 05/16/13 1204 05/16/13 1655 05/16/13 1951 05/16/13 2209  GLUCAP 86 118* 128* 117* 96    Recent Results (from the past 240 hour(s))  URINE CULTURE     Status: None   Collection Time    05/12/13 10:57 AM      Result Value Range Status   Specimen Description URINE, CATHETERIZED   Final   Special Requests NONE   Final   Culture  Setup Time     Final   Value: 05/12/2013 14:43     Performed at [facility] Count     Final   Value: 6,000 COLONIES/ML     Performed at [facility]   Culture     Final   Value: INSIGNIFICANT GROWTH     Performed at [facility]   Report Status 05/13/2013 FINAL   Final  MRSA PCR SCREENING     Status: None   Collection Time    05/12/13  3:51 PM       Result Value Range Status   MRSA by PCR NEGATIVE  NEGATIVE Final   Comment:            The GeneXpert MRSA Assay (FDA     approved for NASAL specimens     only), is one component of a     comprehensive MRSA colonization     surveillance program. It is not     intended to diagnose MRSA     infection nor to guide or     monitor treatment for     MRSA infections.  CULTURE, BLOOD (ROUTINE X 2)     Status: None   Collection Time    05/12/13  4:15 PM      Result Value Range Status   Specimen Description BLOOD RIGHT HAND   Final   Special Requests BOTTLES DRAWN AEROBIC AND  ANAEROBIC 10CC   Final   Culture  Setup Time     Final   Value: 05/12/2013 20:24     Performed at [facility]   Culture     Final   Value:        BLOOD CULTURE RECEIVED NO GROWTH TO DATE CULTURE WILL BE HELD FOR 5 DAYS BEFORE ISSUING A FINAL NEGATIVE REPORT     Performed at [facility]   Report Status PENDING   Incomplete  CULTURE, BLOOD (ROUTINE X 2)     Status: None   Collection Time    05/12/13  4:30 PM      Result Value Range Status   Specimen Description BLOOD RIGHT ARM   Final   Special Requests BOTTLES DRAWN AEROBIC ONLY 10CC   Final   Culture  Setup Time     Final   Value: 05/12/2013 20:24     Performed at [facility]   Culture     Final   Value:        BLOOD CULTURE RECEIVED NO GROWTH TO DATE CULTURE WILL BE HELD FOR 5 DAYS BEFORE ISSUING A FINAL NEGATIVE REPORT     Performed at [facility]   Report Status PENDING   Incomplete     Studies:  Recent x-ray studies have been reviewed in detail by the Attending Physician  Scheduled Meds:  Scheduled Meds: . aspirin EC  81 mg Oral Daily  . atorvastatin  40 mg Oral q1800  . collagenase   Topical Daily  . digoxin  0.0625 mg Oral Daily  . enoxaparin (LOVENOX) injection  40 mg Subcutaneous QHS  . insulin aspart  0-9 Units Subcutaneous TID WC  . nitroGLYCERIN  1 inch Topical Q8H  . potassium chloride  20 mEq Oral BID   . ranolazine  500 mg Oral BID  . sodium chloride  3 mL Intravenous Q12H    Time spent on care of this patient: 35 mins   [facility] T  [facility] Office  [phone] Pager - Text Page per Loretha Stapler as per below:  On-Call/Text Page:      [email or web address]      password [email or web address]  If 7PM-7AM, please contact night-coverage [email or web address] Password [email or web address] 05/17/2013, 1:43 PM   LOS: 5 days

## 2013-05-18 DIAGNOSIS — E876 Hypokalemia: Secondary | ICD-10-CM

## 2013-05-18 DIAGNOSIS — S98919A Complete traumatic amputation of unspecified foot, level unspecified, initial encounter: Secondary | ICD-10-CM

## 2013-05-18 LAB — CULTURE, BLOOD (ROUTINE X 2)
Culture: NO GROWTH
Culture: NO GROWTH

## 2013-05-18 LAB — BASIC METABOLIC PANEL
BUN: 41 mg/dL — ABNORMAL HIGH (ref 6–23)
CO2: 33 mEq/L — ABNORMAL HIGH (ref 19–32)
Chloride: 96 mEq/L (ref 96–112)
GFR calc Af Amer: 64 mL/min — ABNORMAL LOW (ref >90)
Glucose, Bld: 177 mg/dL — ABNORMAL HIGH (ref 70–99)
Potassium: 4 mEq/L (ref 3.5–5.1)
Sodium: 134 mEq/L — ABNORMAL LOW (ref 135–145)

## 2013-05-18 LAB — GLUCOSE, CAPILLARY
Glucose-Capillary: 131 mg/dL — ABNORMAL HIGH (ref 70–99)
Glucose-Capillary: 152 mg/dL — ABNORMAL HIGH (ref 70–99)
Glucose-Capillary: 189 mg/dL — ABNORMAL HIGH (ref 70–99)

## 2013-05-18 MED ORDER — DEXTROSE 5 % IV SOLN
1.0000 g | Freq: Three times a day (TID) | INTRAVENOUS | Status: DC
  Administered 2013-05-18 – 2013-05-22 (×13): 1 g via INTRAVENOUS
  Filled 2013-05-18 (×16): qty 1

## 2013-05-18 MED ORDER — VANCOMYCIN HCL IN DEXTROSE 1-5 GM/200ML-% IV SOLN
1000.0000 mg | Freq: Two times a day (BID) | INTRAVENOUS | Status: DC
  Administered 2013-05-18 – 2013-05-22 (×9): 1000 mg via INTRAVENOUS
  Filled 2013-05-18 (×11): qty 200

## 2013-05-18 MED ORDER — ISOSORBIDE MONONITRATE ER 30 MG PO TB24
30.0000 mg | ORAL_TABLET | Freq: Every day | ORAL | Status: DC | PRN
  Filled 2013-05-18: qty 1

## 2013-05-18 MED ORDER — DIPHENHYDRAMINE HCL 50 MG/ML IJ SOLN
INTRAMUSCULAR | Status: AC
  Administered 2013-05-18: 50 mg
  Filled 2013-05-18: qty 1

## 2013-05-18 NOTE — Progress Notes (Signed)
[facility]  Subjective: No complaints. She states that she initially refused f/u care at HF clinic, but is now reconsidering. She wishes to discuss these options with her daughter first.   Objective: Vital signs in last 24 hours: Temp:  [97.3 F (36.3 C)-97.6 F (36.4 C)] 97.6 F (36.4 C) (09/25 0450) Pulse Rate:  [76-78] 78 (09/25 0450) Resp:  [18-23] 19 (09/25 0450) BP: (95-105)/(62-64) 105/63 mmHg (09/25 0450) SpO2:  [88 %-95 %] 88 % (09/25 0450) Weight:  [180 lb 12.4 oz (82 kg)] 180 lb 12.4 oz (82 kg) (09/25 0450) Last BM Date: 05/17/13  Intake/Output from previous day: 09/24 0701 - 09/25 0700 In: 711.3 [P.O.:640; I.V.:71.3] Out: 3175 [Urine:3175] Intake/Output this shift: Total I/O In: 300 [P.O.:300] Out: -   Medications Current Facility-Administered Medications  Medication Dose Route Frequency Provider Last Rate Last Dose  . acetaminophen (TYLENOL) tablet 650 mg  650 mg Oral Q4H PRN Lonia Blood, MD      . aspirin EC tablet 81 mg  81 mg Oral Daily Catarina Hartshorn, MD   81 mg at 05/18/13 1000  . atorvastatin (LIPITOR) tablet 40 mg  40 mg Oral q1800 Lonia Blood, MD   40 mg at 05/17/13 1800  . aztreonam (AZACTAM) 1 g in dextrose 5 % 50 mL IVPB  1 g Intravenous Q8H Saima Rizwan, MD      . collagenase (SANTYL) ointment   Topical Daily Catarina Hartshorn, MD      . digoxin (LANOXIN) tablet 0.0625 mg  0.0625 mg Oral Daily Brittainy Simmons, PA-C   0.0625 mg at 05/18/13 1000  . enoxaparin (LOVENOX) injection 40 mg  40 mg Subcutaneous QHS Catarina Hartshorn, MD   40 mg at 05/17/13 2230  . furosemide (LASIX) 250 mg in dextrose 5 % 250 mL infusion  10 mg/hr Intravenous Continuous Dolores Patty, MD 10 mL/hr at 05/17/13 1152 10 mg/hr at 05/17/13 1152  . insulin aspart (novoLOG) injection 0-9 Units  0-9 Units Subcutaneous TID WC Catarina Hartshorn, MD   1 Units at 05/16/13 1721  . isosorbide mononitrate (IMDUR) 24 hr tablet 30 mg  30 mg Oral Daily PRN Calvert Cantor, MD       . ondansetron (ZOFRAN) injection 4 mg  4 mg Intravenous Q6H PRN Catarina Hartshorn, MD   4 mg at 05/15/13 1804  . potassium chloride SA (K-DUR,KLOR-CON) CR tablet 20 mEq  20 mEq Oral BID Dolores Patty, MD   20 mEq at 05/18/13 1000  . pramoxine-mineral oil-zinc (TUCKS) rectal ointment   Rectal TID PRN Jinger Neighbors, NP      . ranolazine (RANEXA) 12 hr tablet 500 mg  500 mg Oral BID Brittainy Simmons, PA-C   500 mg at 05/18/13 1000  . senna-docusate (Senokot-S) tablet 2 tablet  2 tablet Oral QHS PRN Jinger Neighbors, NP   2 tablet at 05/12/13 2144  . vancomycin (VANCOCIN) IVPB 1000 mg/200 mL premix  1,000 mg Intravenous Q12H Calvert Cantor, MD        PE: General appearance: alert, cooperative and no distress Lungs: clear to auscultation bilaterally Heart: regular rate and rhythm, S1, S2 normal, no murmur, click, rub or gallop Extremities: 2+ bilateral LEE Pulses: 2+ and symmetric Skin: warm and dry Neurologic: Grossly normal  Lab Results:  No results found for this basename: WBC, HGB, HCT, PLT,  in the last 72 hours BMET  Recent Labs  05/16/13 0449  NA 137  K 3.8  CL 101  CO2 28  GLUCOSE 88  BUN 40*  CREATININE 0.98  CALCIUM 10.1    Assessment/Plan  Principal Problem:   Acute on chronic combined systolic and diastolic congestive heart failure, NYHA class 3 Active Problems:   LBBB (left bundle branch block)   DM, Type 2 IDDM   Ischemic cardiomyopathy, EF 25% 05/13/13   CAD, 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 She needs CABG   Hypoxemia   Falling   Hypercalcemia   Systolic and diastolic CHF, acute on chronic   History of noncompliance with medical treatment, presenting hazards to health  Plan: Pt diuresed 2.5 L yesterday and 7.9 L total. Pt was seen and examined by HF team today. Pt is refusing f/u care at HF clinic. HF team recs are for continued diuretics for fluid removal, until pt reaches dry weight (170lbs). Her weight today is 180 lb. Continue on IV Lasix drip. BP is in the low  100s. No ACE/ARB or BB at this time due to BP. Renal function is stable. Continue with strict I/Os, and daily weights. We appreciate HF team's input.     LOS: 6 days    Brittainy M. Sharol Harness, PA-C 05/18/2013 11:48 AM  Pt seen & examined. Agree with findings, exam & recommendations above.  Plan is simple -- continue to diurese; as volume comes down, will try to re-initiate CHF medications beyond Digoxin.  K>4, Mg > 2  Severe MV CAD - CABG (albeit not a great option) is her only revascularization option.    Ranexa added back.  Appreciate CHF Svc input -- will ask them to revisit after she talks with her DTR.    Very poor overall insight.  I do agree that I so not think she will every be appropriate for d/c to home.  ? Assited Living.  Would consider initiating D/c planning for early next week.  Marykay Lex, M.D., M.S. [facility] [address] [place], [place]  [address]  [phone] Pager # [phone] 05/18/2013 6:36 PM

## 2013-05-18 NOTE — Progress Notes (Signed)
Physical Therapy Treatment Patient Details Name: [name] [name] MRN: [id number] DOB: [birth year] Today's Date: 05/18/2013 Time: 0825-0853 PT Time Calculation (min): 28 min  PT Assessment / Plan / Recommendation  History of Present Illness 67 year old female with a history of ischemic dilated cardiomyopathy, right popliteal artery occlusion, and DVT previously treated with Coumadin, diabetes mellitus, hyperparathyroidism who presented to the emergency department after a fall at home. According to the patient, there was no syncope. The patient laid on her floor for 2 days unable to get up until she was found by her neighbor. 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  Pt states she is use to using a sliding board to transfer bed<>chair therefore will attempt next session.  Cont to recommend SNF at d/c to maximize functional recovery & independence.     Follow Up Recommendations  SNF     Does the patient have the potential to tolerate intense rehabilitation     Barriers to Discharge        Equipment Recommendations  None recommended by PT    Recommendations for Other Services    Frequency Min 3X/week   Progress towards PT Goals Progress towards PT goals: Progressing toward goals  Plan Current plan remains appropriate    Precautions / Restrictions Precautions Precautions: Fall Restrictions Weight Bearing Restrictions: No RLE Weight Bearing: Touchdown weight bearing Other Position/Activity Restrictions: TDWB indicated from chart last admission       Mobility  Bed Mobility Bed Mobility: Supine to Sit;Sitting - Scoot to Edge of Bed Supine to Sit: 4: Min assist;HOB elevated;With rails Sitting - Scoot to Edge of Bed: 4: Min assist Details for Bed Mobility Assistance: cues for sequencing & technique.  (A) to bring shoulders/trunk forwards  & use of draw pad to pivot hips around to EOB.  Pt expresses fear of falling Transfers Transfers: Sit to Stand;Stand to Sit;Stand Pivot Transfers Sit to Stand: 1: +2 Total assist;With upper extremity assist;From bed;From chair/3-in-1;With armrests Sit to Stand: Patient Percentage: 70% Stand to Sit: 1: +2 Total assist;With upper extremity assist;With armrests;To chair/3-in-1 Stand to Sit: Patient Percentage: 70% Stand Pivot Transfers: 1: +2 Total assist Stand Pivot Transfers: Patient Percentage: 70% Details for Transfer Assistance: cues for hand placement & technique.  Pt fearful & requires increased time to prepare herself.  Max encouragement & reassurance.  Pt placing more than TDWBing RLE with pivot.   Ambulation/Gait Ambulation/Gait Assistance: Not tested (comment)      PT Goals (current goals can now be found in the care plan section) Acute Rehab PT Goals Time For Goal Achievement: 05/27/13 Potential to Achieve Goals: Fair  Visit Information  Last PT Received On: 05/18/13 Assistance Needed: +2 History of Present Illness: 67 year old female with a history of ischemic dilated cardiomyopathy, right popliteal artery occlusion, and DVT previously treated with Coumadin, diabetes mellitus, hyperparathyroidism who presented to the emergency department after a fall at home. According to the patient, there was no syncope. The patient laid on her floor for 2 days unable to get up until she was found by her neighbor. 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

## 2013-05-18 NOTE — Progress Notes (Signed)
[facility] Progress Note [place] TEAM 1 - Stepdown/ICU TEAM   SYLVI RYBOLT AVW:[id number] DOB: 1946/07/13 DOA: 05/12/2013 PCP: No primary provider on file.  Admit HPI / Brief Narrative: 67 year old female with a history of ischemic dilated cardiomyopathy, right popliteal artery occlusion, and DVT previously treated with Coumadin, diabetes mellitus, hyperparathyroidism who presented to the emergency department after a fall at home. According to the patient, there was no syncope. The patient laid on her floor for 2 days unable to get up until she was found by her neighbor. The patient was recently discharged from [facility] on 05/09/2013 after a 2 week stay for UTI and recurrent falls. The patient had not taken any of her medications and since she was discharged from the hospital including her furosemide, imdur, and aspirin. She complainsedof some chest discomfort with shortness of breath. Apparently, the patient had been very resistant to placement in a skilled nursing facility against the wishes of her daughter.   In ED, the patient was found to be fluid overloaded with chest x-ray suggesting left pleural effusion as well as proBNP 30,492. The patient was noted to be hypoxemic at 75% on room air. The patient was given furosemide 80 mg IV x1. BMP was unremarkable. CBC was also unremarkable. The patient's temperature was 92.9. Urinalysis did not suggest UTI. CPK was 116, and iSTAT troponin was negative. CT of the brain did not show any acute infarction, but showed diffuse swelling of the scalp as well as her face.   Assessment/Plan:  Acute respiratory failure with hypoxia due to : A) Acute on chronic systolic congestive heart failure/ischemic cardiomyopathy EF 25% February 2014 - the patient was noncompliant with medications - heart failure team consulted but have signed off/she apparently told them she wasn't interested in following up with them- later clarified pt does not trust female MD's but  open to working with female practitioners B) Bronchitis vs HCAP New productive cough since admit- CXR 9/21 with LLL opacity- begin empiric tx for HCAP with VAnc and Azactam (allergic to PCN)  Diabetes mellitus type 2 CBGs well-controlled  Primary hyperparathyroidism with hypercalcemia Patient missed a scheduled outpatient endocrine evaluation - with ongoing diuresis and calcitonin x48 hours calcium - calcium slightly elevated  Chest pain with history coronary artery disease x3 vessels November 2013 Patient did not followup with thoracic surgery as recommended  Hypothermia Urinalysis unrevealing - no evidence of acute infection - temperature has been normal since admission  History of B DVT October 2013 Was noncompliant with Coumadin therapy - repeat Doppler September 2014 negative for DVT - not felt to be a candidate for future anticoagulation due to noncompliance and frequent falls  Recurrent falls  Peripheral arterial disease to include 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 Wound care following - patient has been noncompliant with following up with vascular surgery since TMA-says is willing to work with PT- suspect if can ever get HF compensated pt should be able to mobilize  (has boot and walker at home)-cont PT/OT  History of severe noncompliance -pt endorsed is originally from [employer] [place]" and historically female providers are not allowed to care for female pts esp w/o chaparone but she also has a cultural distrust of female MD's  Code Status: FULL Family Communication: no family present at time of exam Disposition Plan: SDU-possibly to LTAC/[facility] (since has female MD) - updated daughter 9/25  Consultants: Heart failure team Cardiology - [facility]  Procedures: None  Antibiotics: Vancomycin 9/19 >> 9/20  Aztreonam 9/19 >> 9/21  DVT prophylaxis: lovenox  HPI/Subjective: Less SOB but still requiring O2- now with  productive cough w/ yellow sputum.  Objective: Blood pressure 105/63, pulse 78, temperature 97.6 F (36.4 C), temperature source Oral, resp. rate 19, height 5\' 6"  (1.676 m), weight 82 kg (180 lb 12.4 oz), SpO2 88.00%.  Intake/Output Summary (Last 24 hours) at 05/18/13 1133 Last data filed at 05/18/13 1000  Gross per 24 hour  Intake 771.33 ml  Output   2775 ml  Net -2003.67 ml   Exam: General: No acute respiratory distress when laying in bed Lungs: Fine crackles diffusely/more prominent in bilateral bases-wet cough- sats decrease to 87% with talking/exertion- cont [place] O2 Cardiovascular: Regular rate and rhythm without murmur gallop or rub  Abdomen: Nontender, nondistended, soft, bowel sounds positive, no rebound, no ascites, no appreciable mass Extremities: Diffuse edema at 3+ Right < left lower extremities and 1+ bilateral upper extremities Rt < left  Data Reviewed: Basic Metabolic Panel:  Recent Labs Lab 05/12/13 1055 05/13/13 0347 05/14/13 0354 05/15/13 0355 05/16/13 0449  NA 137 138 137 138 137  K 4.4 3.9 3.4* 3.6 3.8  CL 106 107 106 106 101  CO2 19 21 24 25 28   GLUCOSE 87 125* 106* 102* 88  BUN 48* 49* 44* 43* 40*  CREATININE 0.89 0.92 0.86 0.90 0.98  CALCIUM 11.4* 10.6* 10.2 10.0 10.1   Liver Function Tests:  Recent Labs Lab 05/12/13 1055  AST 32  ALT 29  ALKPHOS 257*  BILITOT 1.1  PROT 6.7  ALBUMIN 2.1*   CBC:  Recent Labs Lab 05/12/13 1055 05/13/13 0347 05/14/13 0354  WBC 8.6 7.5 5.4  NEUTROABS 6.5  --   --   HGB 14.1 12.5 12.6  HCT 43.2 38.6 39.8  MCV 83.6 84.6 85.4  PLT 229 209 196   Cardiac Enzymes:  Recent Labs Lab 05/12/13 1055  05/15/13 2205 05/16/13 0449 05/16/13 1015 05/16/13 1638 05/17/13 1333  CKTOTAL 116  --   --   --   --   --   --   TROPONINI  --   < > <0.30 <0.30 <0.30 <0.30 <0.30  < > = values in this interval not displayed. BNP (last 3 results)  Recent Labs  10/15/12 2008 05/12/13 1055 05/16/13 0449  PROBNP  19285.0* 30492.0* 25364.0*   CBG:  Recent Labs Lab 05/16/13 1655 05/16/13 1951 05/16/13 2209 05/17/13 2158 05/18/13 0855  GLUCAP 128* 117* 96 145* 131*    Recent Results (from the past 240 hour(s))  URINE CULTURE     Status: None   Collection Time    05/12/13 10:57 AM      Result Value Range Status   Specimen Description URINE, CATHETERIZED   Final   Special Requests NONE   Final   Culture  Setup Time     Final   Value: 05/12/2013 14:43     Performed at [facility] Count     Final   Value: 6,000 COLONIES/ML     Performed at [facility]   Culture     Final   Value: INSIGNIFICANT GROWTH     Performed at [facility]   Report Status 05/13/2013 FINAL   Final  MRSA PCR SCREENING     Status: None   Collection Time    05/12/13  3:51 PM      Result Value Range Status   MRSA by PCR NEGATIVE  NEGATIVE Final   Comment:  The GeneXpert MRSA Assay (FDA     approved for NASAL specimens     only), is one component of a     comprehensive MRSA colonization     surveillance program. It is not     intended to diagnose MRSA     infection nor to guide or     monitor treatment for     MRSA infections.  CULTURE, BLOOD (ROUTINE X 2)     Status: None   Collection Time    05/12/13  4:15 PM      Result Value Range Status   Specimen Description BLOOD RIGHT HAND   Final   Special Requests BOTTLES DRAWN AEROBIC AND ANAEROBIC 10CC   Final   Culture  Setup Time     Final   Value: 05/12/2013 20:24     Performed at [facility]   Culture     Final   Value: NO GROWTH 5 DAYS     Performed at [facility]   Report Status 05/18/2013 FINAL   Final  CULTURE, BLOOD (ROUTINE X 2)     Status: None   Collection Time    05/12/13  4:30 PM      Result Value Range Status   Specimen Description BLOOD RIGHT ARM   Final   Special Requests BOTTLES DRAWN AEROBIC ONLY 10CC   Final   Culture  Setup Time     Final   Value: 05/12/2013 20:24      Performed at [facility]   Culture     Final   Value: NO GROWTH 5 DAYS     Performed at [facility]   Report Status 05/18/2013 FINAL   Final     Studies:  Recent x-ray studies have been reviewed in detail by the Attending Physician  Scheduled Meds:  Scheduled Meds: . aspirin EC  81 mg Oral Daily  . atorvastatin  40 mg Oral q1800  . aztreonam  1 g Intravenous Q8H  . collagenase   Topical Daily  . digoxin  0.0625 mg Oral Daily  . enoxaparin (LOVENOX) injection  40 mg Subcutaneous QHS  . insulin aspart  0-9 Units Subcutaneous TID WC  . potassium chloride  20 mEq Oral BID  . ranolazine  500 mg Oral BID  . vancomycin  1,000 mg Intravenous Q12H    Time spent on care of this patient: 35 mins   ELLIS,ALLISON L. ANP  [facility] Office  [phone] Pager - Text Page per Loretha Stapler as per below:  On-Call/Text Page:      [email or web address]      password [email or web address]  If 7PM-7AM, please contact night-coverage [email or web address] Password [email or web address] 05/18/2013, 11:33 AM   LOS: 6 days     I have examined the patient, reviewed the chart and modified the above note which I agree with.   Stephani Janak,MD [phone] 05/18/2013, 5:30 PM

## 2013-05-18 NOTE — Progress Notes (Signed)
ANTIBIOTIC CONSULT NOTE - INITIAL  Pharmacy Consult for Aztreonam, Vancomycin Indication: pneumonia  Allergies  Allergen Reactions  . Penicillins Anaphylaxis  . Sulfa Antibiotics Anaphylaxis  . Levofloxacin Itching    Patient Measurements: Height: 5\' 6"  (167.6 cm) Weight: 180 lb 12.4 oz (82 kg) IBW/kg (Calculated) : 59.3  Vital Signs: Temp: 97.6 F (36.4 C) (09/25 0450) Temp src: Oral (09/25 0450) BP: 105/63 mmHg (09/25 0450) Pulse Rate: 78 (09/25 0450) Intake/Output from previous day: 09/24 0701 - 09/25 0700 In: 711.3 [P.O.:640; I.V.:71.3] Out: 3175 [Urine:3175] Intake/Output from this shift: Total I/O In: 200 [P.O.:200] Out: -   Labs:  Recent Labs  05/16/13 0449  CREATININE 0.98   Estimated Creatinine Clearance: 61 ml/min (by C-G formula based on Cr of 0.98). No results found for this basename: Rolm Gala, VANCORANDOM, GENTTROUGH, GENTPEAK, GENTRANDOM, TOBRATROUGH, TOBRAPEAK, TOBRARND, AMIKACINPEAK, AMIKACINTROU, AMIKACIN,  in the last 72 hours   Microbiology: Recent Results (from the past 720 hour(s))  URINE CULTURE     Status: None   Collection Time    04/27/13 11:34 PM      Result Value Range Status   Specimen Description URINE, CLEAN CATCH   Final   Special Requests NONE   Final   Culture  Setup Time     Final   Value: 04/28/2013 05:41     Performed at [facility] Count     Final   Value: >=100,000 COLONIES/ML     Performed at [facility]   Culture     Final   Value: PROTEUS MIRABILIS     Performed at [facility]   Report Status 05/01/2013 FINAL   Final   Organism ID, Bacteria PROTEUS MIRABILIS   Final  URINE CULTURE     Status: None   Collection Time    05/12/13 10:57 AM      Result Value Range Status   Specimen Description URINE, CATHETERIZED   Final   Special Requests NONE   Final   Culture  Setup Time     Final   Value: 05/12/2013 14:43     Performed at [facility] Count      Final   Value: 6,000 COLONIES/ML     Performed at [facility]   Culture     Final   Value: INSIGNIFICANT GROWTH     Performed at [facility]   Report Status 05/13/2013 FINAL   Final  MRSA PCR SCREENING     Status: None   Collection Time    05/12/13  3:51 PM      Result Value Range Status   MRSA by PCR NEGATIVE  NEGATIVE Final   Comment:            The GeneXpert MRSA Assay (FDA     approved for NASAL specimens     only), is one component of a     comprehensive MRSA colonization     surveillance program. It is not     intended to diagnose MRSA     infection nor to guide or     monitor treatment for     MRSA infections.  CULTURE, BLOOD (ROUTINE X 2)     Status: None   Collection Time    05/12/13  4:15 PM      Result Value Range Status   Specimen Description BLOOD RIGHT HAND   Final   Special Requests BOTTLES DRAWN AEROBIC AND ANAEROBIC 10CC  Final   Culture  Setup Time     Final   Value: 05/12/2013 20:24     Performed at [facility]   Culture     Final   Value: NO GROWTH 5 DAYS     Performed at [facility]   Report Status 05/18/2013 FINAL   Final  CULTURE, BLOOD (ROUTINE X 2)     Status: None   Collection Time    05/12/13  4:30 PM      Result Value Range Status   Specimen Description BLOOD RIGHT ARM   Final   Special Requests BOTTLES DRAWN AEROBIC ONLY 10CC   Final   Culture  Setup Time     Final   Value: 05/12/2013 20:24     Performed at [facility]   Culture     Final   Value: NO GROWTH 5 DAYS     Performed at [facility]   Report Status 05/18/2013 FINAL   Final    Medical History: Past Medical History  Diagnosis Date  . 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 DVT, bilateral lower limbs 05/2012  . 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 GERD (gastroesophageal reflux disease)   . History of blood transfusion   . Coronary  artery disease     Severe Multivessel Disase; 3 V Disease wtih LAD distribution Viability &  inferior scar.  . Numbness in feet 06/29/12    Right worse than left  . DVT (deep venous thrombosis) 06/29/12  . CHF (congestive heart failure)   . Venous embolism and thrombosis 01/09/13  . Diabetes mellitus type 2, uncontrolled, with complications 01/09/13    Very poorly controlled  . Coronary atherosclerosis of unspecified type of vessel, native or graft   . Blood transfusion, without reported diagnosis 01/09/13  . 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  She was recently discharged from [facility] on 05/09/13 after a 2 week stay for UTI and recurrent falls.  At discharge she did not take any of her medications.  In the ED she was found to be fluid overload with chest xray suggesting left pleural effusion.  Now restarting broad spectrum antibiotics for possible pneumonia after being transferred from [facility].  CrCl=61 ml / min  Goal of Therapy:  Vancomycin trough level 15-20 mcg/ml Appropriate Aztreonam dosing   Plan:  1) Vancomycin 1 Gram iv Q 12 hours 2) Aztreonam 1 Gram iv Q 8 hours 3) Follow up Scr, cultures, fever curve  Thank you. Okey Regal, PharmD [phone]  Elwin Sleight 05/18/2013,9:50 AM

## 2013-05-19 LAB — BASIC METABOLIC PANEL
Chloride: 93 mEq/L — ABNORMAL LOW (ref 96–112)
GFR calc Af Amer: 59 mL/min — ABNORMAL LOW (ref >90)
GFR calc non Af Amer: 51 mL/min — ABNORMAL LOW (ref >90)
Potassium: 4 mEq/L (ref 3.5–5.1)
Sodium: 135 mEq/L (ref 135–145)

## 2013-05-19 NOTE — Progress Notes (Signed)
Pt. Seen and examined. Agree with the NP/PA-C note as written.  Continue IV diuresis to at least dry weight over the weekend. Plan for SNF or LTAC seems appropriate.    Chrystie Nose, MD, [facility] Attending Cardiologist [facility]

## 2013-05-19 NOTE — Progress Notes (Signed)
[facility]  Subjective: Feeling better. No complaints.   Objective: Vital signs in last 24 hours: Temp:  [97.3 F (36.3 C)-97.6 F (36.4 C)] 97.4 F (36.3 C) (09/26 0824) Pulse Rate:  [79-84] 79 (09/26 0824) Resp:  [14-25] 14 (09/26 0824) BP: (104-111)/(56-66) 110/63 mmHg (09/26 0736) SpO2:  [88 %-96 %] 96 % (09/26 0824) Weight:  [172 lb 6.4 oz (78.2 kg)] 172 lb 6.4 oz (78.2 kg) (09/26 0500) Last BM Date: 05/17/13  Intake/Output from previous day: 09/25 0701 - 09/26 0700 In: 1610 [P.O.:700; I.V.:360; IV Piggyback:550] Out: 3350 [Urine:3350] Intake/Output this shift: Total I/O In: -  Out: 550 [Urine:550]  Medications Current Facility-Administered Medications  Medication Dose Route Frequency Provider Last Rate Last Dose  . acetaminophen (TYLENOL) tablet 650 mg  650 mg Oral Q4H PRN Lonia Blood, MD   650 mg at 05/19/13 2952  . aspirin EC tablet 81 mg  81 mg Oral Daily Catarina Hartshorn, MD   81 mg at 05/18/13 1000  . atorvastatin (LIPITOR) tablet 40 mg  40 mg Oral q1800 Lonia Blood, MD   40 mg at 05/18/13 1800  . aztreonam (AZACTAM) 1 g in dextrose 5 % 50 mL IVPB  1 g Intravenous Q8H Calvert Cantor, MD   1 g at 05/19/13 0255  . collagenase (SANTYL) ointment   Topical Daily Catarina Hartshorn, MD      . digoxin (LANOXIN) tablet 0.0625 mg  0.0625 mg Oral Daily Warren Lindahl, PA-C   0.0625 mg at 05/18/13 1000  . enoxaparin (LOVENOX) injection 40 mg  40 mg Subcutaneous QHS Catarina Hartshorn, MD   40 mg at 05/18/13 2130  . furosemide (LASIX) 250 mg in dextrose 5 % 250 mL infusion  10 mg/hr Intravenous Continuous Dolores Patty, MD 10 mL/hr at 05/17/13 1152 10 mg/hr at 05/17/13 1152  . insulin aspart (novoLOG) injection 0-9 Units  0-9 Units Subcutaneous TID WC Catarina Hartshorn, MD   1 Units at 05/19/13 0848  . isosorbide mononitrate (IMDUR) 24 hr tablet 30 mg  30 mg Oral Daily PRN Calvert Cantor, MD      . ondansetron (ZOFRAN) injection 4 mg  4 mg Intravenous Q6H PRN  Catarina Hartshorn, MD   4 mg at 05/15/13 1804  . potassium chloride SA (K-DUR,KLOR-CON) CR tablet 20 mEq  20 mEq Oral BID Dolores Patty, MD   20 mEq at 05/18/13 2130  . pramoxine-mineral oil-zinc (TUCKS) rectal ointment   Rectal TID PRN Jinger Neighbors, NP      . ranolazine (RANEXA) 12 hr tablet 500 mg  500 mg Oral BID Reiley Keisler, PA-C   500 mg at 05/18/13 2130  . senna-docusate (Senokot-S) tablet 2 tablet  2 tablet Oral QHS PRN Jinger Neighbors, NP   2 tablet at 05/12/13 2144  . vancomycin (VANCOCIN) IVPB 1000 mg/200 mL premix  1,000 mg Intravenous Q12H Calvert Cantor, MD   1,000 mg at 05/18/13 2253    PE: General appearance: alert, cooperative and no distress Lungs: clear to auscultation bilaterally Heart: regular rate and rhythm, S1, S2 normal, no murmur, click, rub or gallop Extremities: 1+ bilateral LEE Pulses: 2+ and symmetric Skin: warm and dry Neurologic: Grossly normal  Lab Results:  No results found for this basename: WBC, HGB, HCT, PLT,  in the last 72 hours BMET  Recent Labs  05/18/13 1100  NA 134*  K 4.0  CL 96  CO2 33*  GLUCOSE 177*  BUN 41*  CREATININE 1.03  CALCIUM 10.3   BNP (last 3 results)  Recent Labs  10/15/12 2008 05/12/13 1055 05/16/13 0449  PROBNP 19285.0* 30492.0* 25364.0*    Filed Weights   05/17/13 0500 05/18/13 0450 05/19/13 0500  Weight: 180 lb 12.4 oz (82 kg) 180 lb 12.4 oz (82 kg) 172 lb 6.4 oz (78.2 kg)    Assessment/Plan  Principal Problem:   Acute on chronic combined systolic and diastolic congestive heart failure, NYHA class 3 Active Problems:   LBBB (left bundle branch block)   DM, Type 2 IDDM   Ischemic cardiomyopathy, EF 25% 05/13/13   CAD, 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 She needs CABG   Hypoxemia   Falling   Hypercalcemia   Systolic and diastolic CHF, acute on chronic   History of noncompliance with medical treatment, presenting hazards to health  Plan: She continues to diurese well. -10.5 L since admission. Her weight is  down to 172 lb today. Dry weight is 170.  Continue with IV diuretics with transition to PO in the next day or so. No BMP ordered for today. I will order. Per Dr. Herbie Baltimore, maintain K >4 and Mg > 2. BP is stable. The patient wishes to discuss HF clinic and SNF with her daughter today, before making decisions.    LOS: 7 days    Jeralyn Nolden M. Delmer Islam 05/19/2013 8:53 AM

## 2013-05-19 NOTE — Progress Notes (Signed)
[facility] Progress Note [place] TEAM 1 - Stepdown/ICU TEAM   [name] [name] JXB:[id number] DOB: Jan 21, 1946 DOA: 05/12/2013 PCP: No primary provider on file.  Admit HPI / Brief Narrative: 67 year old female with a history of ischemic dilated cardiomyopathy, right popliteal artery occlusion, and DVT previously treated with Coumadin, diabetes mellitus, hyperparathyroidism who presented to the emergency department after a fall at home. According to the patient, there was no syncope. The patient laid on her floor for 2 days unable to get up until she was found by her neighbor. The patient was recently discharged from [facility] on 05/09/2013 after a 2 week stay for UTI and recurrent falls. The patient had not taken any of her medications and since she was discharged from the hospital including her furosemide, imdur, and aspirin. She complainsedof some chest discomfort with shortness of breath. Apparently, the patient had been very resistant to placement in a skilled nursing facility against the wishes of her daughter.   In ED, the patient was found to be fluid overloaded with chest x-ray suggesting left pleural effusion as well as proBNP 30,492. The patient was noted to be hypoxemic at 75% on room air. The patient was given furosemide 80 mg IV x1. BMP was unremarkable. CBC was also unremarkable. The patient's temperature was 92.9. Urinalysis did not suggest UTI. CPK was 116, and iSTAT troponin was negative. CT of the brain did not show any acute infarction, but showed diffuse swelling of the scalp as well as her face.   Assessment/Plan:  Acute respiratory failure with hypoxia due to : A) Acute on chronic systolic congestive heart failure/ischemic cardiomyopathy EF 25% February 2014 - the patient was noncompliant with medications - heart failure team consulted but have signed off/she apparently told them she wasn't interested in following up with them- later clarified pt does not trust female MD's but  open to working with female practitioners-diuresing well/ about 10 liters off- will aggressively diurese over WE then dc to LTAC on Monday if bed available B) Bronchitis vs HCAP New productive cough since admit- CXR 9/21 with LLL opacity- begin empiric tx for HCAP with Vanc and Azactam (allergic to PCN)  Diabetes mellitus type 2 CBGs well-controlled  Primary hyperparathyroidism with hypercalcemia Patient missed a scheduled outpatient endocrine evaluation - with ongoing diuresis and calcitonin x48 hours calcium - calcium slightly elevated  Chest pain with history coronary artery disease x3 vessels November 2013 Patient did not followup with thoracic surgery as recommended  Hypothermia Urinalysis unrevealing - no evidence of acute infection - temperature has been normal since admission  History of B DVT October 2013 Was noncompliant with Coumadin therapy - repeat Doppler September 2014 negative for DVT - not felt to be a candidate for future anticoagulation due to noncompliance and frequent falls  Recurrent falls  Peripheral arterial disease to include 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 Wound care following - patient has been noncompliant with following up with vascular surgery since TMA-says is willing to work with PT- suspect if can ever get HF compensated pt should be able to mobilize  (has boot and walker at home)-cont PT/OT  History of severe noncompliance -pt endorsed is originally from [place]" and historically female providers are not allowed to care for female pts esp w/o chaparone but she also has a cultural distrust of female MD's  Code Status: FULL Family Communication: no family present at time of exam Disposition Plan: SDU-possibly to LTAC/[facility] (since has female MD) -anticipate will  be medically ready by Monday- updated daughter 9/25  Consultants: Heart failure team Cardiology -  [facility]  Procedures: None  Antibiotics: Vancomycin 9/19 >> 9/20 Aztreonam 9/19 >> 9/21  DVT prophylaxis: lovenox  HPI/Subjective: No complaints today.  Objective: Blood pressure 110/63, pulse 79, temperature 97.4 F (36.3 C), temperature source Oral, resp. rate 14, height 5\' 6"  (1.676 m), weight 78.2 kg (172 lb 6.4 oz), SpO2 96.00%.  Intake/Output Summary (Last 24 hours) at 05/19/13 1057 Last data filed at 05/19/13 0900  Gross per 24 hour  Intake   1490 ml  Output   3350 ml  Net  -1860 ml   Exam: General: No acute respiratory distress when laying in bed Lungs: Fine crackles diffusely/more prominent in bilateral bases-no longer desat on 02 with talking- cont Kiel O2 Cardiovascular: Regular rate and rhythm without murmur gallop or rub  Abdomen: Nontender, nondistended, soft, bowel sounds positive, no rebound, no ascites, no appreciable mass Extremities: Diffuse edema at 2+ left > right edema bilateral lower extremities and 1+ bilateral upper extremities Rt < left  Data Reviewed: Basic Metabolic Panel:  Recent Labs Lab 05/14/13 0354 05/15/13 0355 05/16/13 0449 05/18/13 1100 05/19/13 0800  NA 137 138 137 134* 135  K 3.4* 3.6 3.8 4.0 4.0  CL 106 106 101 96 93*  CO2 24 25 28  33* 35*  GLUCOSE 106* 102* 88 177* 131*  BUN 44* 43* 40* 41* 40*  CREATININE 0.86 0.90 0.98 1.03 1.10  CALCIUM 10.2 10.0 10.1 10.3 10.5   Liver Function Tests: No results found for this basename: AST, ALT, ALKPHOS, BILITOT, PROT, ALBUMIN,  in the last 168 hours CBC:  Recent Labs Lab 05/13/13 0347 05/14/13 0354  WBC 7.5 5.4  HGB 12.5 12.6  HCT 38.6 39.8  MCV 84.6 85.4  PLT 209 196   Cardiac Enzymes:  Recent Labs Lab 05/15/13 2205 05/16/13 0449 05/16/13 1015 05/16/13 1638 05/17/13 1333  TROPONINI <0.30 <0.30 <0.30 <0.30 <0.30   BNP (last 3 results)  Recent Labs  10/15/12 2008 05/12/13 1055 05/16/13 0449  PROBNP 19285.0* 30492.0* 25364.0*   CBG:  Recent Labs Lab  05/18/13 0855 05/18/13 1306 05/18/13 1705 05/18/13 2206 05/19/13 0822  GLUCAP 131* 152* 189* 156* 131*    Recent Results (from the past 240 hour(s))  URINE CULTURE     Status: None   Collection Time    05/12/13 10:57 AM      Result Value Range Status   Specimen Description URINE, CATHETERIZED   Final   Special Requests NONE   Final   Culture  Setup Time     Final   Value: 05/12/2013 14:43     Performed at [facility] Count     Final   Value: 6,000 COLONIES/ML     Performed at [facility]   Culture     Final   Value: INSIGNIFICANT GROWTH     Performed at [facility]   Report Status 05/13/2013 FINAL   Final  MRSA PCR SCREENING     Status: None   Collection Time    05/12/13  3:51 PM      Result Value Range Status   MRSA by PCR NEGATIVE  NEGATIVE Final   Comment:            The GeneXpert MRSA Assay (FDA     approved for NASAL specimens     only), is one component of a     comprehensive MRSA colonization  surveillance program. It is not     intended to diagnose MRSA     infection nor to guide or     monitor treatment for     MRSA infections.  CULTURE, BLOOD (ROUTINE X 2)     Status: None   Collection Time    05/12/13  4:15 PM      Result Value Range Status   Specimen Description BLOOD RIGHT HAND   Final   Special Requests BOTTLES DRAWN AEROBIC AND ANAEROBIC 10CC   Final   Culture  Setup Time     Final   Value: 05/12/2013 20:24     Performed at [facility]   Culture     Final   Value: NO GROWTH 5 DAYS     Performed at [facility]   Report Status 05/18/2013 FINAL   Final  CULTURE, BLOOD (ROUTINE X 2)     Status: None   Collection Time    05/12/13  4:30 PM      Result Value Range Status   Specimen Description BLOOD RIGHT ARM   Final   Special Requests BOTTLES DRAWN AEROBIC ONLY 10CC   Final   Culture  Setup Time     Final   Value: 05/12/2013 20:24     Performed at [facility]   Culture     Final    Value: NO GROWTH 5 DAYS     Performed at [facility]   Report Status 05/18/2013 FINAL   Final     Studies:  Recent x-ray studies have been reviewed in detail by the Attending Physician  Scheduled Meds:  Scheduled Meds: . aspirin EC  81 mg Oral Daily  . atorvastatin  40 mg Oral q1800  . aztreonam  1 g Intravenous Q8H  . collagenase   Topical Daily  . digoxin  0.0625 mg Oral Daily  . enoxaparin (LOVENOX) injection  40 mg Subcutaneous QHS  . insulin aspart  0-9 Units Subcutaneous TID WC  . potassium chloride  20 mEq Oral BID  . ranolazine  500 mg Oral BID  . vancomycin  1,000 mg Intravenous Q12H    Time spent on care of this patient: 35 mins   ELLIS,ALLISON L. ANP  [facility] Office  [phone] Pager - Text Page per Loretha Stapler as per below:  On-Call/Text Page:      [email or web address]      password [email or web address]  If 7PM-7AM, please contact night-coverage [email or web address] Password [facility] 05/19/2013, 10:57 AM   LOS: 7 days      I have examined the patient, reviewed the chart and modified the above note which I agree with.   Seham Gardenhire,MD [phone] 05/19/2013, 4:32 PM

## 2013-05-19 NOTE — Evaluation (Signed)
Occupational Therapy Evaluation Patient Details Name: [name] [name] MRN: [id number] DOB: [birth year] Today's Date: 05/19/2013 Time: 4098-1191 OT Time Calculation (min): 20 min  OT Assessment / Plan / Recommendation History of present illness 67 year old female with a history of ischemic dilated cardiomyopathy, right popliteal artery occlusion, and DVT previously treated with Coumadin, diabetes mellitus, hyperparathyroidism who presented to the emergency department after a fall at home. According to the patient, there was no syncope. The patient laid on her floor for 2 days unable to get up until she was found by her neighbor. Pt with hx of R transmet amputation feb. 2014   Clinical Impression   Pt admitted for above diagnosis and has the deficits listed below. Pt resistant to therapy and requires great encouragement to participate and attempt to become more I.  Pt would benefit from a trial of OT to see if pt can participate enough to attempt to increase her ease and I with all basic adls so she one day return home.    OT Assessment  Patient needs continued OT Services    Follow Up Recommendations  LTACH;SNF    Barriers to Discharge Decreased caregiver support Pt fell at home before d/c and on floor for 2 days.  Pt unsafe to return home at this time.  Equipment Recommendations  None recommended by OT    Recommendations for Other Services    Frequency  Min 2X/week    Precautions / Restrictions Precautions Precautions: Fall Precaution Comments: severe fear of falling Restrictions Weight Bearing Restrictions: No RUE Weight Bearing: Weight bearing as tolerated LUE Weight Bearing: Weight bearing as tolerated RLE Weight Bearing: Touchdown weight bearing LLE Weight Bearing: Weight bearing as tolerated Other Position/Activity Restrictions: TDWB indicated from chart last admission   Pertinent Vitals/Pain Pt c/o pain in her bottom when sitting.  Pt moved in chair to be more  comfortable.    ADL  Eating/Feeding: Simulated;Set up Where Assessed - Eating/Feeding: Chair Grooming: Performed;Set up Where Assessed - Grooming: Supported sitting Upper Body Bathing: Simulated;Set up Where Assessed - Upper Body Bathing: Supported sitting Lower Body Bathing: Simulated;Maximal assistance Where Assessed - Lower Body Bathing: Supported sit to stand Upper Body Dressing: Simulated;Minimal assistance Where Assessed - Upper Body Dressing: Supported sitting Lower Body Dressing: Simulated;+1 Total assistance Where Assessed - Lower Body Dressing: Supported sit to Pharmacist, hospital: Other (comment) (pt refused to stand to transfer or move out of chair.) Transfers/Ambulation Related to ADLs: Pt stated as soon as therapist arrived that she was NOT getting up under any circumstances. ADL Comments: Pt able to do most UE adls.  LE adls more difficult as pt is refusing to stand at this time.    OT Diagnosis: Generalized weakness;Acute pain  OT Problem List: Decreased strength;Decreased activity tolerance;Impaired balance (sitting and/or standing);Decreased safety awareness;Decreased knowledge of use of DME or AE;Pain OT Treatment Interventions: Self-care/ADL training;Therapeutic activities;DME and/or AE instruction   OT Goals(Current goals can be found in the care plan section) Acute Rehab OT Goals Patient Stated Goal: wants to stay in hospital until SHE is ready to leave. OT Goal Formulation: With patient Time For Goal Achievement: 06/02/13 Potential to Achieve Goals: Fair ADL Goals Pt Will Perform Lower Body Bathing: with min assist;sit to/from stand;sitting/lateral leans Pt Will Perform Lower Body Dressing: with min assist;sitting/lateral leans;sit to/from stand Pt Will Transfer to Toilet: with mod assist;stand pivot transfer;bedside commode Additional ADL Goal #1: Pt will transfer sit to stand with mod A x1 in prep for more activities in standing.  Additional ADL Goal #2:  Pt will complete task in standing with mod assist for 2 minutes.  Visit Information  Last OT Received On: 05/19/13 Assistance Needed: +2 (if attempting to ambulate) History of Present Illness: 67 year old female with a history of ischemic dilated cardiomyopathy, right popliteal artery occlusion, and DVT previously treated with Coumadin, diabetes mellitus, hyperparathyroidism who presented to the emergency department after a fall at home. According to the patient, there was no syncope. The patient laid on her floor for 2 days unable to get up until she was found by her neighbor. Pt with hx of R transmet amputation feb. 2014       Prior Functioning     Home Living Family/patient expects to be discharged to:: Private residence Living Arrangements: Alone Available Help at Discharge: Family;Available 24 hours/day Type of Home: House Home Access: Stairs to enter [employer] of Steps: 2, plans to have ramp build Entrance Stairs-Rails: None Home Layout: One level Home Equipment: Wheelchair - [employer] - 2 wheels;Bedside commode Additional Comments: was at [facility] until 04/18/13 then went home with [facility]; sponge bathed at home with assist from [facility] aide (infomation from chart last admission) Prior Function Level of Independence: Independent with assistive device(s) Comments: used WC and walked short distances without assistive device, worked on Hydrologist transfers with [employer] Communication Communication: Other (comment) Dominant Hand: Right         Vision/Perception Vision - History Baseline Vision: No visual deficits Patient Visual Report: No change from baseline Vision - Assessment Vision Assessment: Vision not tested   [employer] Arousal/Alertness: Awake/alert Behavior During Therapy:  (increased anxiety with mobility- pt fearful of falling) Overall Cognitive Status: No family/caregiver present to determine baseline cognitive  functioning Area of Impairment: Memory Memory: Decreased short-term memory General Comments: pt reports loss of memory with recent hospitalizations. Otherwise appears henerally intact.    Extremity/Trunk Assessment Upper Extremity Assessment Upper Extremity Assessment: Overall WFL for tasks assessed Lower Extremity Assessment Lower Extremity Assessment: Defer to PT evaluation RLE Deficits / Details: transmet amp wrapped in bulky dressing.  PT writhes in pain with ROM of LLE.  Pt did not actively assist with ROM RLE: Unable to fully assess due to pain LLE Deficits / Details: pt with edema in foot and leg. Did not actively assist with ROM Cervical / Trunk Assessment Cervical / Trunk Assessment: Normal     Mobility Bed Mobility Bed Mobility: Supine to Sit;Sitting - Scoot to Edge of Bed Supine to Sit: 5: Supervision;HOB elevated;With rails Sitting - Scoot to Edge of Bed: 5: Supervision Details for Bed Mobility Assistance: Incr time.   Pt elevated HOB to ~90 degrees despite encouragement to keep it flat.   Transfers Transfers: Not assessed Sit to Stand: 3: Mod assist;With upper extremity assist;With armrests;From bed;From chair/3-in-1 Sit to Stand: Patient Percentage: 70% Stand to Sit: 4: Min assist;With upper extremity assist;With armrests;To chair/3-in-1 Stand to Sit: Patient Percentage: 70% Details for Transfer Assistance: cues for hand placement, use of UE's to offload weight from RLE to maintain TDWBing, & technique.  (A) to achieve standing, balance & rotation of hips around with pivot.       Exercise     Balance Static Sitting Balance Static Sitting - Balance Support: No upper extremity supported;Feet supported Static Sitting - Level of Assistance: 7: Independent Static Sitting - Comment/# of Minutes: 10   End of Session OT - End of Session Activity Tolerance: Other (comment) (Pt self limiting.  Very afraid of  getting up; no standing.) Patient left: in chair;with call  bell/phone within reach Nurse Communication: Mobility status  GO     Hope Budds 05/19/2013, 12:19 PM [phone]

## 2013-05-19 NOTE — Progress Notes (Signed)
Physical Therapy Treatment Patient Details Name: [name] [name] MRN: [id number] DOB: Feb 01, 1946 Today's Date: 05/19/2013 Time: 4098-1191 PT Time Calculation (min): 27 min  PT Assessment / Plan / Recommendation  History of Present Illness 67 year old female with a history of ischemic dilated cardiomyopathy, right popliteal artery occlusion, and DVT previously treated with Coumadin, diabetes mellitus, hyperparathyroidism who presented to the emergency department after a fall at home. According to the patient, there was no syncope. The patient laid on her floor for 2 days unable to get up until she was found by her neighbor. Pt with hx of R transmet amputation feb. 2014   PT Comments   Pt still has not been able to ambulate at this date but did perform bed<>chair transfers better today.   Anxiety/fear of falling limits pt confidence.  Pt does not maintain TDWBing RLE with OOB activity.  Pt deferred sliding board transfer even though she stated that's how she's comfortable with transferring.  Cont to strongly recommend SNF at d/c.     Follow Up Recommendations  SNF     Does the patient have the potential to tolerate intense rehabilitation     Barriers to Discharge        Equipment Recommendations  None recommended by PT    Recommendations for Other Services    Frequency Min 3X/week   Progress towards PT Goals Progress towards PT goals: Progressing toward goals  Plan Current plan remains appropriate    Precautions / Restrictions Precautions Precautions: Fall Precaution Comments: severe fear of falling Restrictions Weight Bearing Restrictions: No RUE Weight Bearing: Weight bearing as tolerated LUE Weight Bearing: Weight bearing as tolerated RLE Weight Bearing: Touchdown weight bearing LLE Weight Bearing: Weight bearing as tolerated Other Position/Activity Restrictions: TDWB indicated from chart last admission       Mobility  Bed Mobility Bed Mobility: Supine to Sit;Sitting -  Scoot to Edge of Bed Supine to Sit: 5: Supervision;HOB elevated;With rails Sitting - Scoot to Edge of Bed: 5: Supervision Details for Bed Mobility Assistance: Incr time.   Pt elevated HOB to ~90 degrees despite encouragement to keep it flat.   Transfers Transfers: Sit to Stand;Stand to [employer] Transfers Sit to Stand: 3: Mod assist;With upper extremity assist;With armrests;From bed;From chair/3-in-1 Stand to Sit: 4: Min assist;With upper extremity assist;With armrests;To chair/3-in-1 Stand Pivot Transfers: 4: Min assist Details for Transfer Assistance: cues for hand placement, use of UE's to offload weight from RLE to maintain TDWBing, & technique.  (A) to achieve standing, balance & rotation of hips around with pivot.   Ambulation/Gait Ambulation/Gait Assistance: Not tested (comment)    Exercises     PT Diagnosis:    PT Problem List:   PT Treatment Interventions:     PT Goals (current goals can now be found in the care plan section) Acute Rehab PT Goals Time For Goal Achievement: 05/27/13 Potential to Achieve Goals: Fair  Visit Information  Last PT Received On: 05/19/13 Assistance Needed: +2 (if attempting to ambulate) History of Present Illness: 66 year old female with a history of ischemic dilated cardiomyopathy, right popliteal artery occlusion, and DVT previously treated with Coumadin, diabetes mellitus, hyperparathyroidism who presented to the emergency department after a fall at home. According to the patient, there was no syncope. The patient laid on her floor for 2 days unable to get up until she was found by her neighbor. Pt with hx of R transmet amputation feb. 2014    Subjective Data      Cognition  Cognition  Arousal/Alertness: Awake/alert Behavior During Therapy:  (increased anxiety with mobility- pt fearful of falling) Overall Cognitive Status: No family/caregiver present to determine baseline cognitive functioning    Balance     End of Session PT - End of  Session Equipment Utilized During Treatment: Gait belt Activity Tolerance: Patient tolerated treatment well Patient left: in chair;with call bell/phone within reach Nurse Communication: Mobility status   GP     Lara Mulch 05/19/2013, 12:10 PM  Verdell Face, PTA [phone] 05/19/2013

## 2013-05-20 ENCOUNTER — Inpatient Hospital Stay (HOSPITAL_COMMUNITY): Payer: Medicare Other

## 2013-05-20 DIAGNOSIS — J984 Other disorders of lung: Secondary | ICD-10-CM

## 2013-05-20 DIAGNOSIS — J189 Pneumonia, unspecified organism: Secondary | ICD-10-CM | POA: Diagnosis present

## 2013-05-20 LAB — GLUCOSE, CAPILLARY
Glucose-Capillary: 149 mg/dL — ABNORMAL HIGH (ref 70–99)
Glucose-Capillary: 197 mg/dL — ABNORMAL HIGH (ref 70–99)

## 2013-05-20 LAB — BASIC METABOLIC PANEL
BUN: 43 mg/dL — ABNORMAL HIGH (ref 6–23)
CO2: 38 mEq/L — ABNORMAL HIGH (ref 19–32)
Calcium: 10.3 mg/dL (ref 8.4–10.5)
Creatinine, Ser: 1.06 mg/dL (ref 0.50–1.10)
GFR calc non Af Amer: 53 mL/min — ABNORMAL LOW (ref >90)
Glucose, Bld: 162 mg/dL — ABNORMAL HIGH (ref 70–99)

## 2013-05-20 NOTE — Progress Notes (Signed)
Subjective:  No CP/SOB  Objective:  Temp:  [97.3 F (36.3 C)-98.1 F (36.7 C)] 98 F (36.7 C) (09/27 0800) Pulse Rate:  [81-84] 84 (09/27 0800) Resp:  [19-24] 24 (09/27 0800) BP: (100-116)/(54-67) 102/62 mmHg (09/27 0800) SpO2:  [88 %-97 %] 97 % (09/27 0518) Weight change:   Intake/Output from previous day: 09/26 0701 - 09/27 0700 In: 730 [P.O.:180; I.V.:100; IV Piggyback:450] Out: 1225 [Urine:1225]  Intake/Output from this shift: Total I/O In: -  Out: 750 [Urine:750]  Physical Exam: General appearance: alert and no distress Neck: no adenopathy, no carotid bruit, no JVD, supple, symmetrical, trachea midline and thyroid not enlarged, symmetric, no tenderness/mass/nodules Lungs: clear to auscultation bilaterally Heart: regular rate and rhythm, S1, S2 normal, no murmur, click, rub or gallop Extremities: extremities normal, atraumatic, no cyanosis or edema  Lab Results: Results for orders placed during the hospital encounter of 05/12/13 (from the past 48 hour(s))  GLUCOSE, CAPILLARY     Status: Abnormal   Collection Time    05/18/13  1:06 PM      Result Value Range   Glucose-Capillary 152 (*) 70 - 99 mg/dL  GLUCOSE, CAPILLARY     Status: Abnormal   Collection Time    05/18/13  5:05 PM      Result Value Range   Glucose-Capillary 189 (*) 70 - 99 mg/dL  GLUCOSE, CAPILLARY     Status: Abnormal   Collection Time    05/18/13 10:06 PM      Result Value Range   Glucose-Capillary 156 (*) 70 - 99 mg/dL   Comment 1 Documented in Chart     Comment 2 Notify RN    BASIC METABOLIC PANEL     Status: Abnormal   Collection Time    05/19/13  8:00 AM      Result Value Range   Sodium 135  135 - 145 mEq/L   Potassium 4.0  3.5 - 5.1 mEq/L   Chloride 93 (*) 96 - 112 mEq/L   CO2 35 (*) 19 - 32 mEq/L   Glucose, Bld 131 (*) 70 - 99 mg/dL   BUN 40 (*) 6 - 23 mg/dL   Creatinine, Ser 8.11  0.50 - 1.10 mg/dL   Calcium 91.4  8.4 - 78.2 mg/dL   GFR calc non Af Amer 51 (*) >90 mL/min   GFR calc Af Amer 59 (*) >90 mL/min   Comment: (NOTE)     The eGFR has been calculated using the CKD EPI equation.     This calculation has not been validated in all clinical situations.     eGFR's persistently <90 mL/min signify possible Chronic Kidney     Disease.  GLUCOSE, CAPILLARY     Status: Abnormal   Collection Time    05/19/13  8:22 AM      Result Value Range   Glucose-Capillary 131 (*) 70 - 99 mg/dL  GLUCOSE, CAPILLARY     Status: Abnormal   Collection Time    05/19/13 12:14 PM      Result Value Range   Glucose-Capillary 192 (*) 70 - 99 mg/dL  GLUCOSE, CAPILLARY     Status: Abnormal   Collection Time    05/19/13  5:09 PM      Result Value Range   Glucose-Capillary 124 (*) 70 - 99 mg/dL  GLUCOSE, CAPILLARY     Status: Abnormal   Collection Time    05/19/13  9:28 PM      Result Value Range   Glucose-Capillary  155 (*) 70 - 99 mg/dL  BASIC METABOLIC PANEL     Status: Abnormal   Collection Time    05/20/13  5:45 AM      Result Value Range   Sodium 134 (*) 135 - 145 mEq/L   Potassium 4.0  3.5 - 5.1 mEq/L   Chloride 90 (*) 96 - 112 mEq/L   CO2 38 (*) 19 - 32 mEq/L   Glucose, Bld 162 (*) 70 - 99 mg/dL   BUN 43 (*) 6 - 23 mg/dL   Creatinine, Ser 2.44  0.50 - 1.10 mg/dL   Calcium 01.0  8.4 - 27.2 mg/dL   GFR calc non Af Amer 53 (*) >90 mL/min   GFR calc Af Amer 62 (*) >90 mL/min   Comment: (NOTE)     The eGFR has been calculated using the CKD EPI equation.     This calculation has not been validated in all clinical situations.     eGFR's persistently <90 mL/min signify possible Chronic Kidney     Disease.  GLUCOSE, CAPILLARY     Status: Abnormal   Collection Time    05/20/13  8:33 AM      Result Value Range   Glucose-Capillary 149 (*) 70 - 99 mg/dL    Imaging: Imaging results have been reviewed  Assessment/Plan:   1. Principal Problem: 2.   Acute on chronic combined systolic and diastolic congestive heart failure, NYHA class 3 3. Active Problems: 4.   LBBB  (left bundle branch block) 5.   DM, Type 2 IDDM 6.   Ischemic cardiomyopathy, EF 25% 05/13/13 7.   CAD, 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 She needs CABG 8.   Hypoxemia 9.   Falling 10.   Hypercalcemia 11.   Systolic and diastolic CHF, acute on chronic 12.   History of noncompliance with medical treatment, presenting hazards to health 13.   Time Spent Directly with Patient:  20 minutes  Length of Stay:  LOS: 8 days  On IV lasix drip. Continues to diurese. I/O -750 last 24 hours. Lungs clear. No periph edema. Pt lives in [place] and doesn't have the transportation to come to OP CHF clinic. Continue current Rx until she reaches dry weight. She may want to follow up in [facility] [place] [facility] with Dr. Mariah Milling.   Runell Gess 05/20/2013, 11:12 AM

## 2013-05-20 NOTE — Progress Notes (Signed)
[facility] Progress Note [place] TEAM 1 - Stepdown/ICU TEAM   [name] [name]:[id number] DOB: Apr 04, 1946 DOA: 05/12/2013 PCP: No primary provider on file.  Admit HPI / Brief Narrative: 67 year old female with a history of ischemic dilated cardiomyopathy, right popliteal artery occlusion, and DVT previously treated with Coumadin, diabetes mellitus, hyperparathyroidism who presented to the emergency department after a fall at home. According to the patient, there was no syncope. The patient laid on her floor for 2 days unable to get up until she was found by her neighbor. The patient was recently discharged from [facility] on 05/09/2013 after a 2 week stay for UTI and recurrent falls. The patient had not taken any of her medications and since she was discharged from the hospital including her furosemide, imdur, and aspirin. She complainsedof some chest discomfort with shortness of breath. Apparently, the patient had been very resistant to placement in a skilled nursing facility against the wishes of her daughter.   In ED, the patient was found to be fluid overloaded with chest x-ray suggesting left pleural effusion as well as proBNP 30,492. The patient was noted to be hypoxemic at 75% on room air. The patient was given furosemide 80 mg IV x1. BMP was unremarkable. CBC was also unremarkable. The patient's temperature was 92.9. Urinalysis did not suggest UTI. CPK was 116, and iSTAT troponin was negative. CT of the brain did not show any acute infarction, but showed diffuse swelling of the scalp as well as her face.   Assessment/Plan:  Acute respiratory failure with hypoxia due to : A) Acute on chronic systolic congestive heart failure/ischemic cardiomyopathy EF 25% February 2014 - the patient was noncompliant with medications - heart failure team consulted but have signed off/she apparently told them she wasn't interested in following up with them- later clarified pt does not trust female MD's but  open to working with female practitioners-diuresing well- will aggressively diurese over WE then dc to LTAC on Monday if bed available - to follow up with Dr Mariah Milling in [place] (if she goes home to to snf in [place]) B) Bronchitis vs HCAP New productive cough since admit- CXR 9/21 with LLL opacity- begin empiric tx for HCAP with Vanc and Azactam (allergic to PCN)  Diabetes mellitus type 2 CBGs well-controlled  Primary hyperparathyroidism with hypercalcemia Patient missed a scheduled outpatient endocrine evaluation - with ongoing diuresis and calcitonin x48 hours calcium - calcium slightly elevated  Chest pain with history coronary artery disease x3 vessels November 2013 Patient did not followup with thoracic surgery as recommended  Hypothermia Urinalysis unrevealing - no evidence of acute infection - temperature has been normal since admission  History of B DVT October 2013 Was noncompliant with Coumadin therapy - repeat Doppler September 2014 negative for DVT - not felt to be a candidate for future anticoagulation due to noncompliance and frequent falls  Recurrent falls - max assist   Peripheral arterial disease to include 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 Wound care following - patient has been noncompliant with following up with vascular surgery since TMA-says is willing to work with PT- suspect if can ever get HF compensated pt should be able to mobilize  (has boot and walker at home)-cont PT/OT  History of severe noncompliance -pt endorsed is originally from [employer] [place]" and historically female providers are not allowed to care for female pts esp w/o chaparone but she also has a cultural distrust of female MD's  Code Status: FULL Family Communication:  no family present at time of exam Disposition Plan: SDU-possibly to LTAC/[facility] (since has female MD) -anticipate will be medically ready by Monday- updated daughter  9/25  Consultants: Heart failure team Cardiology - [facility]  Procedures: None  Antibiotics: Vancomycin 9/19 >> 9/20- resumed on 9/25 Aztreonam 9/19 >> 9/21- resumed on 9/25  DVT prophylaxis: lovenox  HPI/Subjective:  Continues to cough of yellow sputum but overall much better.   Objective: Blood pressure 105/65, pulse 84, temperature 98.2 F (36.8 C), temperature source Oral, resp. rate 24, height 5\' 6"  (1.676 m), weight 78.2 kg (172 lb 6.4 oz), SpO2 97.00%.  Intake/Output Summary (Last 24 hours) at 05/20/13 1449 Last data filed at 05/20/13 1346  Gross per 24 hour  Intake    640 ml  Output   2100 ml  Net  -1460 ml   Exam: General: No acute respiratory distress when laying in bed Lungs: Fine crackles diffusely/more prominent in bilateral bases-no longer desat on 02 with talking- cont  O2 Cardiovascular: Regular rate and rhythm without murmur gallop or rub  Abdomen: Nontender, nondistended, soft, bowel sounds positive, no rebound, no ascites, no appreciable mass Extremities: Diffuse edema at 2+ left > right edema bilateral lower extremities and 1+ bilateral upper extremities Rt < left  Data Reviewed: Basic Metabolic Panel:  Recent Labs Lab 05/15/13 0355 05/16/13 0449 05/18/13 1100 05/19/13 0800 05/20/13 0545  NA 138 137 134* 135 134*  K 3.6 3.8 4.0 4.0 4.0  CL 106 101 96 93* 90*  CO2 25 28 33* 35* 38*  GLUCOSE 102* 88 177* 131* 162*  BUN 43* 40* 41* 40* 43*  CREATININE 0.90 0.98 1.03 1.10 1.06  CALCIUM 10.0 10.1 10.3 10.5 10.3   Liver Function Tests: No results found for this basename: AST, ALT, ALKPHOS, BILITOT, PROT, ALBUMIN,  in the last 168 hours CBC:  Recent Labs Lab 05/14/13 0354  WBC 5.4  HGB 12.6  HCT 39.8  MCV 85.4  PLT 196   Cardiac Enzymes:  Recent Labs Lab 05/15/13 2205 05/16/13 0449 05/16/13 1015 05/16/13 1638 05/17/13 1333  TROPONINI <0.30 <0.30 <0.30 <0.30 <0.30   BNP (last 3 results)  Recent Labs  10/15/12 2008  05/12/13 1055 05/16/13 0449  PROBNP 19285.0* 30492.0* 25364.0*   CBG:  Recent Labs Lab 05/19/13 1214 05/19/13 1709 05/19/13 2128 05/20/13 0833 05/20/13 1225  GLUCAP 192* 124* 155* 149* 213*    Recent Results (from the past 240 hour(s))  URINE CULTURE     Status: None   Collection Time    05/12/13 10:57 AM      Result Value Range Status   Specimen Description URINE, CATHETERIZED   Final   Special Requests NONE   Final   Culture  Setup Time     Final   Value: 05/12/2013 14:43     Performed at [facility] Count     Final   Value: 6,000 COLONIES/ML     Performed at [facility]   Culture     Final   Value: INSIGNIFICANT GROWTH     Performed at [facility]   Report Status 05/13/2013 FINAL   Final  MRSA PCR SCREENING     Status: None   Collection Time    05/12/13  3:51 PM      Result Value Range Status   MRSA by PCR NEGATIVE  NEGATIVE Final   Comment:            The GeneXpert MRSA Assay (FDA  approved for NASAL specimens     only), is one component of a     comprehensive MRSA colonization     surveillance program. It is not     intended to diagnose MRSA     infection nor to guide or     monitor treatment for     MRSA infections.  CULTURE, BLOOD (ROUTINE X 2)     Status: None   Collection Time    05/12/13  4:15 PM      Result Value Range Status   Specimen Description BLOOD RIGHT HAND   Final   Special Requests BOTTLES DRAWN AEROBIC AND ANAEROBIC 10CC   Final   Culture  Setup Time     Final   Value: 05/12/2013 20:24     Performed at [facility]   Culture     Final   Value: NO GROWTH 5 DAYS     Performed at [facility]   Report Status 05/18/2013 FINAL   Final  CULTURE, BLOOD (ROUTINE X 2)     Status: None   Collection Time    05/12/13  4:30 PM      Result Value Range Status   Specimen Description BLOOD RIGHT ARM   Final   Special Requests BOTTLES DRAWN AEROBIC ONLY 10CC   Final   Culture  Setup Time      Final   Value: 05/12/2013 20:24     Performed at [facility]   Culture     Final   Value: NO GROWTH 5 DAYS     Performed at [facility]   Report Status 05/18/2013 FINAL   Final     Studies:  Recent x-ray studies have been reviewed in detail by the Attending Physician  Scheduled Meds:  Scheduled Meds: . aspirin EC  81 mg Oral Daily  . atorvastatin  40 mg Oral q1800  . aztreonam  1 g Intravenous Q8H  . collagenase   Topical Daily  . digoxin  0.0625 mg Oral Daily  . enoxaparin (LOVENOX) injection  40 mg Subcutaneous QHS  . insulin aspart  0-9 Units Subcutaneous TID WC  . potassium chloride  20 mEq Oral BID  . ranolazine  500 mg Oral BID  . vancomycin  1,000 mg Intravenous Q12H    Time spent on care of this patient: 35 mins   Debora Stockdale, MD   If 7PM-7AM, please contact night-coverage [email or web address] Password [facility] 05/20/2013, 2:49 PM   LOS: 8 days

## 2013-05-21 LAB — BASIC METABOLIC PANEL
BUN: 42 mg/dL — ABNORMAL HIGH (ref 6–23)
Creatinine, Ser: 1.14 mg/dL — ABNORMAL HIGH (ref 0.50–1.10)
GFR calc Af Amer: 57 mL/min — ABNORMAL LOW (ref >90)
GFR calc non Af Amer: 49 mL/min — ABNORMAL LOW (ref >90)
Glucose, Bld: 157 mg/dL — ABNORMAL HIGH (ref 70–99)
Potassium: 4.4 mEq/L (ref 3.5–5.1)

## 2013-05-21 LAB — HEPATIC FUNCTION PANEL
AST: 28 U/L (ref 0–37)
Albumin: 1.9 g/dL — ABNORMAL LOW (ref 3.5–5.2)
Alkaline Phosphatase: 230 U/L — ABNORMAL HIGH (ref 39–117)
Total Bilirubin: 0.7 mg/dL (ref 0.3–1.2)
Total Protein: 6.3 g/dL (ref 6.0–8.3)

## 2013-05-21 LAB — GLUCOSE, CAPILLARY
Glucose-Capillary: 179 mg/dL — ABNORMAL HIGH (ref 70–99)
Glucose-Capillary: 161 mg/dL — ABNORMAL HIGH (ref 70–99)
Glucose-Capillary: 147 mg/dL — ABNORMAL HIGH (ref 70–99)

## 2013-05-21 MED ORDER — SALINE SPRAY 0.65 % NA SOLN
1.0000 | NASAL | Status: DC | PRN
  Administered 2013-05-21: 1 via NASAL
  Filled 2013-05-21: qty 44

## 2013-05-21 MED ORDER — FUROSEMIDE 20 MG PO TABS
20.0000 mg | ORAL_TABLET | Freq: Two times a day (BID) | ORAL | Status: DC
  Administered 2013-05-22: 20 mg via ORAL
  Filled 2013-05-21 (×3): qty 1

## 2013-05-21 MED ORDER — SODIUM CHLORIDE 0.9 % IV SOLN: 60.0000 mg | Freq: Once | INTRAVENOUS | Status: DC

## 2013-05-21 NOTE — Progress Notes (Signed)
[facility] Progress Note [place] TEAM 1 - Stepdown/ICU TEAM   ARMANDA FORAND ZOX:[id number] DOB: 1946/05/02 DOA: 05/12/2013 PCP: No primary provider on file.  Admit HPI / Brief Narrative: 67 year old female with a history of ischemic dilated cardiomyopathy, right popliteal artery occlusion, and DVT previously treated with Coumadin, diabetes mellitus, hyperparathyroidism who presented to the emergency department after a fall at home. According to the patient, there was no syncope. The patient laid on her floor for 2 days unable to get up until she was found by her neighbor. The patient was recently discharged from [facility] on 05/09/2013 after a 2 week stay for UTI and recurrent falls. The patient had not taken any of her medications and since she was discharged from the hospital including her furosemide, imdur, and aspirin. She complainsedof some chest discomfort with shortness of breath. Apparently, the patient had been very resistant to placement in a skilled nursing facility against the wishes of her daughter.   In ED, the patient was found to be fluid overloaded with chest x-ray suggesting left pleural effusion as well as proBNP 30,492. The patient was noted to be hypoxemic at 75% on room air. The patient was given furosemide 80 mg IV x1. BMP was unremarkable. CBC was also unremarkable. The patient's temperature was 92.9. Urinalysis did not suggest UTI. CPK was 116, and iSTAT troponin was negative. CT of the brain did not show any acute infarction, but showed diffuse swelling of the scalp as well as her face.   Assessment/Plan:  Acute respiratory failure with hypoxia due to : A) Acute on chronic systolic congestive heart failure/ischemic cardiomyopathy EF 25% February 2014 - the patient was noncompliant with medications - heart failure team consulted but have signed off/she apparently told them she wasn't interested in following up with them- later clarified pt does not trust female MD's but  open to working with female practitioners-diuresing well- will aggressively diurese over WE then dc to LTAC on Monday if bed available - to follow up with Dr Mariah Milling in [place] (if she goes home to to snf in [place]) - has diuresed 13L- stop lasix infusion now (see below regarding hypercalcemia) - start low dose oral lasix B) Bronchitis vs HCAP New productive cough since admit- CXR 9/21 with LLL opacity - empiric tx for HCAP with Vanc and Azactam (allergic to PCN)  - still quite hypoxic despite diuresis and antibiotics- will check CXR in AM  Diabetes mellitus type 2 CBGs well-controlled  Primary hyperparathyroidism with hypercalcemia Patient missed a scheduled outpatient endocrine evaluation - - calcium becoming more elevated now- calculated is 12.5- will need to slow down diuresis (see above) - asymptomatic - recheck in AM  Clay colored stools - LFTs normal  Chest pain with history coronary artery disease x3 vessels November 2013 Patient did not followup with thoracic surgery as recommended  Hypothermia Urinalysis unrevealing - no evidence of acute infection - temperature has been normal since admission  History of B DVT October 2013 Was noncompliant with Coumadin therapy - repeat Doppler September 2014 negative for DVT - not felt to be a candidate for future anticoagulation due to noncompliance and frequent falls  Recurrent falls - max assist   Peripheral arterial disease to include 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 Wound care following - patient has been noncompliant with following up with vascular surgery since TMA-says is willing to work with PT- suspect if can ever get HF compensated pt should be able to mobilize  (has  boot and walker at home)-cont PT/OT  History of severe noncompliance -pt endorsed is originally from [employer] [place]" and historically female providers are not allowed to care for female pts esp  w/o chaparone but she also has a cultural distrust of female MD's  Code Status: FULL Family Communication: no family present at time of exam Disposition Plan: SDU-possibly to LTAC/[facility] (since has female MD) -anticipate will be medically ready by Monday- updated daughter 9/25  Consultants: Heart failure team Cardiology - [facility]  Procedures: None  Antibiotics: Vancomycin 9/19 >> 9/20- resumed on 9/25 Aztreonam 9/19 >> 9/21- resumed on 9/25  DVT prophylaxis: lovenox  HPI/Subjective:  Continues to have a congested cough- no other complaints.   Objective: Blood pressure 103/54, pulse 82, temperature 97.6 F (36.4 C), temperature source Oral, resp. rate 20, height 5\' 6"  (1.676 m), weight 72.9 kg (160 lb 11.5 oz), SpO2 91.00%.  Intake/Output Summary (Last 24 hours) at 05/21/13 1527 Last data filed at 05/21/13 0900  Gross per 24 hour  Intake   1160 ml  Output   3204 ml  Net  -2044 ml   Exam: General: No acute respiratory distress when laying in bed Lungs:decreased breath sounds- no crackles or wheeze Cardiovascular: Regular rate and rhythm without murmur gallop or rub  Abdomen: Nontender, nondistended, soft, bowel sounds positive, no rebound, no ascites, no appreciable mass Extremities: Diffuse edema has resolved - no cyanosis or clubbing  Data Reviewed: Basic Metabolic Panel:  Recent Labs Lab 05/16/13 0449 05/18/13 1100 05/19/13 0800 05/20/13 0545 05/21/13 0540  NA 137 134* 135 134* 133*  K 3.8 4.0 4.0 4.0 4.4  CL 101 96 93* 90* 90*  CO2 28 33* 35* 38* 41*  GLUCOSE 88 177* 131* 162* 157*  BUN 40* 41* 40* 43* 42*  CREATININE 0.98 1.03 1.10 1.06 1.14*  CALCIUM 10.1 10.3 10.5 10.3 10.4   Liver Function Tests:  Recent Labs Lab 05/21/13 1020  AST 28  ALT 18  ALKPHOS 230*  BILITOT 0.7  PROT 6.3  ALBUMIN 1.9*   CBC: No results found for this basename: WBC, NEUTROABS, HGB, HCT, MCV, PLT,  in the last 168 hours Cardiac Enzymes:  Recent Labs Lab  05/15/13 2205 05/16/13 0449 05/16/13 1015 05/16/13 1638 05/17/13 1333  TROPONINI <0.30 <0.30 <0.30 <0.30 <0.30   BNP (last 3 results)  Recent Labs  10/15/12 2008 05/12/13 1055 05/16/13 0449  PROBNP 19285.0* 30492.0* 25364.0*   CBG:  Recent Labs Lab 05/20/13 1225 05/20/13 1716 05/20/13 2132 05/21/13 0806 05/21/13 1213  GLUCAP 213* 169* 197* 156* 179*    Recent Results (from the past 240 hour(s))  URINE CULTURE     Status: None   Collection Time    05/12/13 10:57 AM      Result Value Range Status   Specimen Description URINE, CATHETERIZED   Final   Special Requests NONE   Final   Culture  Setup Time     Final   Value: 05/12/2013 14:43     Performed at [facility] Count     Final   Value: 6,000 COLONIES/ML     Performed at [facility]   Culture     Final   Value: INSIGNIFICANT GROWTH     Performed at [facility]   Report Status 05/13/2013 FINAL   Final  MRSA PCR SCREENING     Status: None   Collection Time    05/12/13  3:51 PM      Result  Value Range Status   MRSA by PCR NEGATIVE  NEGATIVE Final   Comment:            The GeneXpert MRSA Assay (FDA     approved for NASAL specimens     only), is one component of a     comprehensive MRSA colonization     surveillance program. It is not     intended to diagnose MRSA     infection nor to guide or     monitor treatment for     MRSA infections.  CULTURE, BLOOD (ROUTINE X 2)     Status: None   Collection Time    05/12/13  4:15 PM      Result Value Range Status   Specimen Description BLOOD RIGHT HAND   Final   Special Requests BOTTLES DRAWN AEROBIC AND ANAEROBIC 10CC   Final   Culture  Setup Time     Final   Value: 05/12/2013 20:24     Performed at [facility]   Culture     Final   Value: NO GROWTH 5 DAYS     Performed at [facility]   Report Status 05/18/2013 FINAL   Final  CULTURE, BLOOD (ROUTINE X 2)     Status: None   Collection Time    05/12/13   4:30 PM      Result Value Range Status   Specimen Description BLOOD RIGHT ARM   Final   Special Requests BOTTLES DRAWN AEROBIC ONLY 10CC   Final   Culture  Setup Time     Final   Value: 05/12/2013 20:24     Performed at [facility]   Culture     Final   Value: NO GROWTH 5 DAYS     Performed at [facility]   Report Status 05/18/2013 FINAL   Final     Studies:  Recent x-ray studies have been reviewed in detail by the Attending Physician  Scheduled Meds:  Scheduled Meds: . aspirin EC  81 mg Oral Daily  . atorvastatin  40 mg Oral q1800  . aztreonam  1 g Intravenous Q8H  . collagenase   Topical Daily  . digoxin  0.0625 mg Oral Daily  . enoxaparin (LOVENOX) injection  40 mg Subcutaneous QHS  . insulin aspart  0-9 Units Subcutaneous TID WC  . potassium chloride  20 mEq Oral BID  . ranolazine  500 mg Oral BID  . vancomycin  1,000 mg Intravenous Q12H    Time spent on care of this patient: 35 mins   Deadra Diggins, MD   If 7PM-7AM, please contact night-coverage [email or web address] Password [email or web address] 05/21/2013, 3:27 PM   LOS: 9 days

## 2013-05-21 NOTE — Significant Event (Signed)
CRITICAL VALUE ALERT  Critical value received:  CO2 41  Date of notification: 05/21/13  Time of notification: 0645  Critical value read back:yes  Nurse who received alert:  Jeanmarie Plant RN   MD notified (1st page):  Elray Mcgregor NP  Time of first page: [phone]  Responding MD:  Elray Mcgregor NP  Time MD responded: [phone]

## 2013-05-21 NOTE — Progress Notes (Signed)
ANTIBIOTIC CONSULT NOTE - Follow Up  Pharmacy Consult for Aztreonam, Vancomycin Indication: pneumonia  Allergies  Allergen Reactions  . Penicillins Anaphylaxis  . Sulfa Antibiotics Anaphylaxis  . Levofloxacin Itching   Patient Measurements: Height: 5\' 6"  (167.6 cm) Weight: 160 lb 11.5 oz (72.9 kg) IBW/kg (Calculated) : 59.3  Vital Signs: Temp: 97.5 F (36.4 C) (09/28 1209) Temp src: Oral (09/28 1209) BP: 97/51 mmHg (09/28 1209) Pulse Rate: 79 (09/28 1209) Intake/Output from previous day: 09/27 0701 - 09/28 0700 In: 1950 [P.O.:1060; I.V.:240; IV Piggyback:650] Out: 4379 [Urine:4375; Stool:4] Intake/Output from this shift: Total I/O In: 20 [I.V.:20] Out: 250 [Urine:250]  Labs:  Recent Labs  05/19/13 0800 05/20/13 0545 05/21/13 0540  CREATININE 1.10 1.06 1.14*   Estimated Creatinine Clearance: 49.6 ml/min (by C-G formula based on Cr of 1.14).  Microbiology: Recent Results (from the past 720 hour(s))  URINE CULTURE     Status: None   Collection Time    04/27/13 11:34 PM      Result Value Range Status   Specimen Description URINE, CLEAN CATCH   Final   Special Requests NONE   Final   Culture  Setup Time     Final   Value: 04/28/2013 05:41     Performed at [facility] Count     Final   Value: >=100,000 COLONIES/ML     Performed at [facility]   Culture     Final   Value: PROTEUS MIRABILIS     Performed at [facility]   Report Status 05/01/2013 FINAL   Final   Organism ID, Bacteria PROTEUS MIRABILIS   Final  URINE CULTURE     Status: None   Collection Time    05/12/13 10:57 AM      Result Value Range Status   Specimen Description URINE, CATHETERIZED   Final   Special Requests NONE   Final   Culture  Setup Time     Final   Value: 05/12/2013 14:43     Performed at [facility] Count     Final   Value: 6,000 COLONIES/ML     Performed at [facility]   Culture     Final   Value: INSIGNIFICANT  GROWTH     Performed at [facility]   Report Status 05/13/2013 FINAL   Final  MRSA PCR SCREENING     Status: None   Collection Time    05/12/13  3:51 PM      Result Value Range Status   MRSA by PCR NEGATIVE  NEGATIVE Final   Comment:            The GeneXpert MRSA Assay (FDA     approved for NASAL specimens     only), is one component of a     comprehensive MRSA colonization     surveillance program. It is not     intended to diagnose MRSA     infection nor to guide or     monitor treatment for     MRSA infections.  CULTURE, BLOOD (ROUTINE X 2)     Status: None   Collection Time    05/12/13  4:15 PM      Result Value Range Status   Specimen Description BLOOD RIGHT HAND   Final   Special Requests BOTTLES DRAWN AEROBIC AND ANAEROBIC 10CC   Final   Culture  Setup Time     Final   Value:  05/12/2013 20:24     Performed at [facility]   Culture     Final   Value: NO GROWTH 5 DAYS     Performed at [facility]   Report Status 05/18/2013 FINAL   Final  CULTURE, BLOOD (ROUTINE X 2)     Status: None   Collection Time    05/12/13  4:30 PM      Result Value Range Status   Specimen Description BLOOD RIGHT ARM   Final   Special Requests BOTTLES DRAWN AEROBIC ONLY 10CC   Final   Culture  Setup Time     Final   Value: 05/12/2013 20:24     Performed at [facility]   Culture     Final   Value: NO GROWTH 5 DAYS     Performed at [facility]   Report Status 05/18/2013 FINAL   Final   Medical History: Past Medical History  Diagnosis Date  . 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 DVT, bilateral lower limbs 05/2012  . 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 GERD (gastroesophageal reflux disease)   . History of blood transfusion   . Coronary artery disease     Severe Multivessel Disase; 3 V Disease wtih LAD distribution Viability &  inferior scar.  . Numbness  in feet 06/29/12    Right worse than left  . DVT (deep venous thrombosis) 06/29/12  . CHF (congestive heart failure)   . Venous embolism and thrombosis 01/09/13  . Diabetes mellitus type 2, uncontrolled, with complications 01/09/13    Very poorly controlled  . Coronary atherosclerosis of unspecified type of vessel, native or graft   . Blood transfusion, without reported diagnosis 01/09/13  . 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  She was recently discharged [facility] on [facility]09/13 after a 2 week stay for UTI and recurrent falls.  At discharge she did not take any of her medications.  In the ED she was found to be fluid overload with chest xray suggesting left pleural effusion.  She was placed on  broad spectrum antibiotics for possible pneumonia.  She has continued on these without noted complicatons.  Goal of Therapy:  Vancomycin trough level 15-20 mcg/ml Appropriate Aztreonam dosing   Plan:  1)  Continue Vancomycin 1 Gram iv Q 12 hours 2)  Continue Aztreonam 1 Gram iv Q 8 hours 3)  Follow up Scr, cultures, fever curve and length of therapy with antibiotics.  Nadara Mustard, PharmD., MS Clinical Pharmacist Pager:  [phone] Thank you for allowing pharmacy to be part of this patients care team. 05/21/2013,2:57 PM

## 2013-05-22 ENCOUNTER — Inpatient Hospital Stay (HOSPITAL_COMMUNITY): Payer: Medicare Other

## 2013-05-22 LAB — BASIC METABOLIC PANEL
CO2: 37 mEq/L — ABNORMAL HIGH (ref 19–32)
Calcium: 10.3 mg/dL (ref 8.4–10.5)
Chloride: 89 mEq/L — ABNORMAL LOW (ref 96–112)
Creatinine, Ser: 1.06 mg/dL (ref 0.50–1.10)
GFR calc non Af Amer: 53 mL/min — ABNORMAL LOW (ref >90)
Glucose, Bld: 151 mg/dL — ABNORMAL HIGH (ref 70–99)
Sodium: 134 mEq/L — ABNORMAL LOW (ref 135–145)

## 2013-05-22 LAB — GLUCOSE, CAPILLARY: Glucose-Capillary: 245 mg/dL — ABNORMAL HIGH (ref 70–99)

## 2013-05-22 MED ORDER — VANCOMYCIN HCL IN DEXTROSE 1-5 GM/200ML-% IV SOLN: 1000.0000 mg | Freq: Two times a day (BID) | INTRAVENOUS | Status: AC

## 2013-05-22 MED ORDER — DIGOXIN 62.5 MCG PO TABS: 0.0625 mg | ORAL_TABLET | Freq: Every day | ORAL | Status: AC

## 2013-05-22 MED ORDER — ONDANSETRON HCL 4 MG/2ML IJ SOLN: 4.0000 mg | Freq: Four times a day (QID) | INTRAMUSCULAR | Status: AC | PRN

## 2013-05-22 MED ORDER — SALINE SPRAY 0.65 % NA SOLN: 1.0000 | NASAL | Status: AC | PRN

## 2013-05-22 MED ORDER — PRAMOXINE-ZINC OXIDE IN MO 1-12.5 % RE OINT: TOPICAL_OINTMENT | Freq: Three times a day (TID) | RECTAL | Status: AC | PRN

## 2013-05-22 MED ORDER — POTASSIUM CHLORIDE CRYS ER 20 MEQ PO TBCR: 20.0000 meq | EXTENDED_RELEASE_TABLET | Freq: Two times a day (BID) | ORAL | Status: AC

## 2013-05-22 MED ORDER — FUROSEMIDE 10 MG/ML IJ SOLN: 40.0000 mg | Freq: Two times a day (BID) | INTRAMUSCULAR | Status: DC

## 2013-05-22 MED ORDER — AZTREONAM 1 G IJ SOLR: 1.0000 g | Freq: Three times a day (TID) | INTRAMUSCULAR | Status: AC

## 2013-05-22 MED ORDER — ACETAMINOPHEN 325 MG PO TABS: 650.0000 mg | ORAL_TABLET | ORAL | Status: AC | PRN

## 2013-05-22 MED ORDER — INSULIN ASPART 100 UNIT/ML ~~LOC~~ SOLN: SUBCUTANEOUS | Status: DC

## 2013-05-22 MED ORDER — INSULIN ASPART 100 UNIT/ML ~~LOC~~ SOLN: 1.0000 [IU] | Freq: Three times a day (TID) | SUBCUTANEOUS | Status: DC

## 2013-05-22 MED ORDER — SENNOSIDES-DOCUSATE SODIUM 8.6-50 MG PO TABS: 2.0000 | ORAL_TABLET | Freq: Every evening | ORAL | Status: AC | PRN

## 2013-05-22 MED ORDER — ENOXAPARIN SODIUM 40 MG/0.4ML ~~LOC~~ SOLN: 40.0000 mg | Freq: Every day | SUBCUTANEOUS | Status: AC

## 2013-05-22 MED ORDER — INSULIN ASPART 100 UNIT/ML ~~LOC~~ SOLN: SUBCUTANEOUS | Status: AC

## 2013-05-22 MED ORDER — ATORVASTATIN CALCIUM 40 MG PO TABS: 40.0000 mg | ORAL_TABLET | Freq: Every day | ORAL | Status: AC

## 2013-05-22 MED ORDER — RANOLAZINE ER 500 MG PO TB12: 500.0000 mg | ORAL_TABLET | Freq: Two times a day (BID) | ORAL | Status: AC

## 2013-05-22 MED ORDER — FUROSEMIDE 10 MG/ML IJ SOLN: 40.0000 mg | Freq: Two times a day (BID) | INTRAMUSCULAR | Status: AC

## 2013-05-22 NOTE — Progress Notes (Signed)
The patient was discharge prior to my being able to see her.  I did discuss the plan with Ms. Simmons & agree with her note.  Marykay Lex, MD

## 2013-05-22 NOTE — Progress Notes (Signed)
[facility]  Subjective: No complaints.   Objective: Vital signs in last 24 hours: Temp:  [97.2 F (36.2 C)-97.9 F (36.6 C)] 97.6 F (36.4 C) (09/29 0832) Pulse Rate:  [79-93] 85 (09/29 0832) Resp:  [16-31] 18 (09/29 0832) BP: (97-110)/(51-71) 97/60 mmHg (09/29 0832) SpO2:  [75 %-94 %] 92 % (09/29 0832) Last BM Date: 05/21/13  Intake/Output from previous day: 09/28 0701 - 09/29 0700 In: 838.3 [P.O.:180; I.V.:108.3; IV Piggyback:550] Out: 1200 [Urine:1200] Intake/Output this shift: Total I/O In: 120 [P.O.:120] Out: -   Medications Current Facility-Administered Medications  Medication Dose Route Frequency Provider Last Rate Last Dose  . acetaminophen (TYLENOL) tablet 650 mg  650 mg Oral Q4H PRN Lonia Blood, MD   650 mg at 05/22/13 0553  . aspirin EC tablet 81 mg  81 mg Oral Daily Catarina Hartshorn, MD   81 mg at 05/22/13 1105  . atorvastatin (LIPITOR) tablet 40 mg  40 mg Oral q1800 Lonia Blood, MD   40 mg at 05/21/13 1749  . aztreonam (AZACTAM) 1 g in dextrose 5 % 50 mL IVPB  1 g Intravenous Q8H Saima Rizwan, MD   1 g at 05/22/13 1106  . collagenase (SANTYL) ointment   Topical Daily Catarina Hartshorn, MD      . digoxin (LANOXIN) tablet 0.0625 mg  0.0625 mg Oral Daily Chesney Klimaszewski, PA-C   0.0625 mg at 05/22/13 1000  . enoxaparin (LOVENOX) injection 40 mg  40 mg Subcutaneous QHS Catarina Hartshorn, MD   40 mg at 05/21/13 2148  . furosemide (LASIX) injection 40 mg  40 mg Intravenous BID Lonia Blood, MD      . insulin aspart (novoLOG) injection 0-9 Units  0-9 Units Subcutaneous TID WC Catarina Hartshorn, MD   2 Units at 05/21/13 1748  . isosorbide mononitrate (IMDUR) 24 hr tablet 30 mg  30 mg Oral Daily PRN Calvert Cantor, MD      . ondansetron (ZOFRAN) injection 4 mg  4 mg Intravenous Q6H PRN Catarina Hartshorn, MD   4 mg at 05/15/13 1804  . potassium chloride SA (K-DUR,KLOR-CON) CR tablet 20 mEq  20 mEq Oral BID Dolores Patty, MD   20 mEq at 05/22/13 1000  .  pramoxine-mineral oil-zinc (TUCKS) rectal ointment   Rectal TID PRN Jinger Neighbors, NP      . ranolazine (RANEXA) 12 hr tablet 500 mg  500 mg Oral BID Yoshie Kosel, PA-C   500 mg at 05/22/13 1000  . senna-docusate (Senokot-S) tablet 2 tablet  2 tablet Oral QHS PRN Jinger Neighbors, NP   2 tablet at 05/21/13 0341  . sodium chloride (OCEAN) 0.65 % nasal spray 1 spray  1 spray Each Nare PRN Violet Baldy Reidler, PA-C   1 spray at 05/21/13 2149  . vancomycin (VANCOCIN) IVPB 1000 mg/200 mL premix  1,000 mg Intravenous Q12H Calvert Cantor, MD   1,000 mg at 05/22/13 1106    PE: General appearance: alert, cooperative and no distress Lungs: clear to auscultation bilaterally Heart: regular rate and rhythm Extremities: no LEE Pulses: 2+ and symmetric Skin: warm and dry Neurologic: Grossly normal  BMET  Recent Labs  05/20/13 0545 05/21/13 0540 05/22/13 0545  NA 134* 133* 134*  K 4.0 4.4 4.4  CL 90* 90* 89*  CO2 38* 41* 37*  GLUCOSE 162* 157* 151*  BUN 43* 42* 48*  CREATININE 1.06 1.14* 1.06  CALCIUM 10.3 10.4 10.3   Filed Weights   05/19/13 0500  05/21/13 0335 05/21/13 0809  Weight: 172 lb 6.4 oz (78.2 kg) 160 lb 4.4 oz (72.7 kg) 160 lb 11.5 oz (72.9 kg)    Assessment/Plan  Principal Problem:   Acute on chronic combined systolic and diastolic congestive heart failure, NYHA class 3 Active Problems:   LBBB (left bundle branch block)   DM, Type 2 IDDM   Ischemic cardiomyopathy, EF 25% 05/13/13   CAD, 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 She needs CABG   Hypoxemia   Falling   Hypercalcemia   Systolic and diastolic CHF, acute on chronic   History of noncompliance with medical treatment, presenting hazards to health   Pulmonary infection  Plan: Pt is now at her dry weight of 160 lbs. She will be transferred to [facility] today. The patient prefers to see female providers. I will arrange post hospital f/u with Nada Boozer, NP, at [facility]. If the patient ends up back in [place] after  [facility], would recommend following up at the office of Dr. Mariah Milling.     LOS: 10 days    Bayne Fosnaugh M. Sharol Harness, PA-C 05/22/2013 11:08 AM

## 2013-05-22 NOTE — Discharge Summary (Addendum)
Physician Discharge Summary  [name] [name] OZH:[id number] DOB: 12/21/1945 DOA: 05/12/2013  PCP: No primary provider on file.  Admit date: 05/12/2013 Discharge date: 05/22/2013  Time spent: >30 minutes  Recommendations for Outpatient Follow-up:   1. Cardiology has arranged post hospital f/u with Nada Boozer, NP, at [facility]. If the patient ends up back in [place] after [facility], would recommend following up at the office of Dr. Mariah Milling.   2. Needs OP evaluation of persistent mild hypercalcemia  Discharge Diagnoses:    Acute on chronic combined systolic and diastolic congestive heart failure, NYHA class 3 / Ischemic cardiomyopathy, EF 25% 05/13/13 - due to noncompliance w/ medications    LBBB (left bundle branch block)   DM, Type 2 IDDM   CAD, severe 3V at cath 07/05/12 - needs eventual CABG but has refused to f/u    Hypoxemia   Falls   History of noncompliance with medical treatment, presenting hazards to health   HCAP  Discharge Condition: stable  Diet recommendation: Heart Healthy w/ fluid restriction fo 1500-1800cc/day  Filed Weights   05/19/13 0500 05/21/13 0335 05/21/13 0809  Weight: 78.2 kg (172 lb 6.4 oz) 72.7 kg (160 lb 4.4 oz) 72.9 kg (160 lb 11.5 oz)    History of present illness:  67 year old female with a history of ischemic dilated cardiomyopathy, right popliteal artery occlusion, and DVT previously treated with Coumadin, diabetes mellitus, hyperparathyroidism who presented to the emergency department after a fall at home. According to the patient, there was no syncope. The patient laid on her floor for 2 days unable to get up until she was found by her neighbor. The patient was discharged from [facility] on 05/09/2013 after a 2 week stay for UTI and recurrent falls. The patient had not taken any of her medications since she was discharged from the hospital including her furosemide, imdur, and aspirin. She complained of some chest discomfort with shortness of breath. Apparently,  the patient had been very resistant to placement in a skilled nursing facility against the wishes of her daughter.   In ED, the patient was found to be fluid overloaded with chest x-ray suggesting left pleural effusion as well as proBNP 30,492. The patient was noted to be hypoxemic at 75% on room air. The patient was given furosemide 80 mg IV x1. BMP was unremarkable. CBC was also unremarkable. The patient's temperature was 92.9. Urinalysis did not suggest UTI. CPK was 116, and iSTAT troponin was negative. CT of the brain did not show any acute infarction, but showed diffuse swelling of the scalp as well as her face.    Hospital Course:   Acute respiratory failure with hypoxia due to :  A) Acute on chronic systolic congestive heart failure/ischemic cardiomyopathy  -EF 25% February 2014 - the patient was noncompliant with medications - heart failure team consulted but signed off/she apparently told them she wasn't interested in following up with them - later clarified pt does not trust female MD's but open to working with female practitioners - has diuresed 13L-  lasix infusion dc'd 9/28 (see below regarding hypercalcemia) and she was started on low dose oral lasix  -Follow up CXR 9/29 showed worsening edema and bilateral pleural effusions despite patient at prior dry weight of 160 lbs (presented at weight of 191 lbs) so was changed back to Lasix IV BID -recommend follow BMET closely  -transfer to LTAC for expected prolonged tx course for stabilization of CHF   B) Bronchitis vs HCAP  --New productive cough since admit-  CXR 9/21 with LLL opacity so empiric tx for HCAP with Vanc and Azactam (allergic to PCN) initiated Continues to require 5 L oxygen- CXR as above  Diabetes mellitus type 2  -CBGs well-controlled   Primary hyperparathyroidism with hypercalcemia  -Patient missed a scheduled outpatient endocrine evaluation so will need either to reschedule OP endocrine evaluation or pursue work up  while in LTAC - corrected calcium recently up to 12.5 but she is asymptomatic  -PTH 04/27/13 elevated at 201 and PTH related peptide 16 -TSH 3.014 -no neck or thyroid imaging has been accomplished this admission  Chest pain with history coronary artery disease x3 vessels November 2013  -Patient did not followup with thoracic surgery as recommended - Cardiology has seen   Hypothermia  -Urinalysis unrevealing - no evidence of acute infection - temperature has been normal since admission   History of B DVT October 2013  -Was noncompliant with Coumadin therapy - repeat Doppler September 2014 negative for DVT - not felt to be a candidate for future anticoagulation due to noncompliance and frequent falls   Recurrent falls  - max assist  - continue PT/OT at [facility]  Peripheral arterial disease to include 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  -Wound care followed this admission  -patient has been noncompliant with following up with vascular surgery  -pt says she is willing to work with PT- suspect if can ever get HF compensated pt should be able to mobilize (has boot and walker at home) -cont PT/OT   History of severe noncompliance  -pt endorsed is originally from [employer] [employer] [place]" and historically female providers are not allowed to care for female pts esp w/o chaparone but she also has a cultural distrust of female MD's   Procedures: Transthoracic Echocardiography - Left ventricle: The cavity size was mildly dilated. Systolic function was severely reduced. The estimated ejection fraction was in the range of 20% to 25%. Diffuse hypokinesis. Doppler parameters are consistent with abnormal left ventricular relaxation (grade 1 diastolic dysfunction). - Left atrium: The atrium was mildly dilated. - Right ventricle: The cavity size was mildly dilated. Wall thickness was normal. - Right atrium: The atrium was mildly dilated. - Tricuspid valve:  Moderate regurgitation. - Pulmonary arteries: PA peak pressure: 39mm Hg (S).   Consultations:  Cardiology  Heart Failure Team  Discharge Exam: Filed Vitals:   05/22/13 1317  BP: 93/64  Pulse: 86  Temp: 97.4 F (36.3 C)  Resp: 24   General: No acute respiratory distress when laying in bed  Lungs: Bilateral decreased breath sounds- no crackles or wheeze -5 L oxygen Cardiovascular: Regular rate and rhythm without murmur gallop or rub  Abdomen: Nontender, nondistended, soft, bowel sounds positive, no rebound, no ascites, no appreciable mass  Extremities: Diffuse lower extremity edema has improved to 1-2+ - no clubbing   Discharge Instructions     Discharge Orders   Future Appointments Provider Department Dept Phone   05/29/2013 3:00 PM Nada Boozer, NP [facility] [phone]   Future Orders Complete By Expires   Diet - low sodium heart healthy  As directed    Increase activity slowly  As directed        Medication List    STOP taking these medications       furosemide 40 MG tablet  Commonly known as:  LASIX  Replaced by:  furosemide 10 MG/ML injection     metFORMIN 500 MG tablet  Commonly known as:  GLUCOPHAGE  TAKE these medications       acetaminophen 325 MG tablet  Commonly known as:  TYLENOL  Take 2 tablets (650 mg total) by mouth every 4 (four) hours as needed for pain or fever.     aspirin 81 MG EC tablet  Take 1 tablet (81 mg total) by mouth daily.     atorvastatin 40 MG tablet  Commonly known as:  LIPITOR  Take 1 tablet (40 mg total) by mouth daily at 6 PM.     collagenase ointment  Commonly known as:  SANTYL  - Apply topically daily. Apply to right foot surgical site.  Apply 1/8 inch thickness to slough in wound bed for debridement. Top with NS moist dressing, dry gauze and wrap gauze (kerlix).  Secure with tape.  - Use for 2 weeks.     dextrose 5 % SOLN 50 mL with aztreonam 1 G SOLR 1 g  Inject 1 g into the vein every 8  (eight) hours.     Digoxin 62.5 MCG Tabs  Take 0.0625 mg by mouth daily.     enoxaparin 40 MG/0.4ML injection  Commonly known as:  LOVENOX  Inject 0.4 mLs (40 mg total) into the skin daily.     furosemide 10 MG/ML injection  Commonly known as:  LASIX  Inject 4 mLs (40 mg total) into the vein 2 (two) times daily.     insulin aspart 100 UNIT/ML injection  Commonly known as:  novoLOG  - Check CBG AC and HS and give insulin SQ for the following CBG readings:  - CBG 121-150 = 1 unit  - CBG 151-200 = 2 units  - CBG 201-250 = 3 units  - CBG 251-300 = 5 units  - CBG 301-350 = 7 units  - CBG 351-400 = 9 units  - CBG > 400 CALL MD     isosorbide mononitrate 30 MG 24 hr tablet  Commonly known as:  IMDUR  Take 30 mg by mouth daily as needed (for weakness).     multivitamin Liqd  Take 5 mLs by mouth daily.     ondansetron 4 MG/2ML Soln injection  Commonly known as:  ZOFRAN  Inject 2 mLs (4 mg total) into the vein every 6 (six) hours as needed for nausea.     oxyCODONE 5 MG immediate release tablet  Commonly known as:  Oxy IR/ROXICODONE  Take 1 tablet (5 mg total) by mouth every 4 (four) hours as needed for pain.     potassium chloride SA 20 MEQ tablet  Commonly known as:  K-DUR,KLOR-CON  Take 1 tablet (20 mEq total) by mouth 2 (two) times daily.     pramoxine-mineral oil-zinc 1-12.5 % rectal ointment  Commonly known as:  TUCKS  Place rectally 3 (three) times daily as needed.     ranolazine 500 MG 12 hr tablet  Commonly known as:  RANEXA  Take 1 tablet (500 mg total) by mouth 2 (two) times daily.     senna-docusate 8.6-50 MG per tablet  Commonly known as:  Senokot-S  Take 2 tablets by mouth at bedtime as needed.     sodium chloride 0.65 % Soln nasal spray  Commonly known as:  OCEAN  Place 1 spray into the nose as needed for congestion.     vancomycin 1 GM/200ML Soln  Commonly known as:  VANCOCIN  Inject 200 mLs (1,000 mg total) into the vein every 12 (twelve)  hours.       Allergies  Allergen Reactions  .  Penicillins Anaphylaxis  . Sulfa Antibiotics Anaphylaxis  . Levofloxacin Itching   Follow-up Information   Follow up with [facility] R, NP On 05/29/2013. (3:00 pm )    Specialty:  Cardiology   Contact information:   [address] [place] [address] [phone]       The results of significant diagnostics from this hospitalization (including imaging, microbiology, ancillary and laboratory) are listed below for reference.    Significant Diagnostic Studies: Dg Chest 2 View  05/12/2013   CLINICAL DATA:  Shortness of breath, pain, fall  EXAM: CHEST  2 VIEW  COMPARISON:  04/27/2013  FINDINGS: Cardiomegaly again noted. Stable linear scarring right midlung. There is small left pleural effusion with left lower lobe atelectasis or infiltrate.  IMPRESSION: Small left pleural effusion with left lower lobe atelectasis or infiltrate. Stable scarring or atelectasis in right midlung.   Electronically Signed   By: Natasha Mead   On: 05/12/2013 12:09   Dg Chest 2 View  04/27/2013   *RADIOLOGY REPORT*  Clinical Data: Fall.  CHEST - 2 VIEW  Comparison: 01/05/2013.  Findings: Chronic cardiopericardial enlargement.  Similar to prior, low volume lungs with band-like opacities. Prominent perihilar vessels. Small pleural effusions.  No overt edema in the upper lungs.  No evidence of acute fracture.  IMPRESSION: 1. Low volume lungs with basilar atelectasis and pleural fluid. 2.  Pulmonary venous congestion.   Original Report Authenticated By: Tiburcio Pea   Ct Head Wo Contrast  05/12/2013   CLINICAL DATA:  Fall, LOC  EXAM: CT HEAD WITHOUT CONTRAST  TECHNIQUE: Contiguous axial images were obtained from the base of the skull through the vertex without intravenous contrast.  COMPARISON:  04/04/2008  FINDINGS: There is skull swelling and subcutaneous stranding in left parietal region. There is soft tissue swelling and subcutaneous stranding left  temporal region and left periorbital preseptal region. Left face/zygomatic region soft tissue swelling and subcutaneous stranding.  No intracranial hemorrhage, mass effect or midline shift. No paranasal sinuses air-fluid levels. No intraventricular hemorrhage. Mild cerebral atrophy. Mild periventricular white matter decreased attenuation probable due to chronic small vessel ischemic changes.  IMPRESSION: 1. There is scalp swelling and subcutaneous stranding left parietal region left temporal region left face and left periorbital. 2. No skull fracture. No intracranial hemorrhage, mass effect or midline shift. 3. Mild cerebral atrophy. Mild periventricular white matter decreased attenuation probable due to chronic small vessel ischemic changes.   Electronically Signed   By: Natasha Mead   On: 05/12/2013 12:45   Dg Chest Port 1 View  05/22/2013   CLINICAL DATA:  Respiratory failure.  EXAM: PORTABLE CHEST - 1 VIEW  COMPARISON:  05/20/2013  FINDINGS: Diffuse bilateral airspace disease, likely pulmonary edema. Layering effusions bilaterally, moderate to large. This is greater on the left. Cardiomegaly. Aeration worsened since prior study.  IMPRESSION: Moderate to large bilateral effusions, left greater than right. Worsening bilateral airspace opacities, likely worsening edema.   Electronically Signed   By: Charlett Nose M.D.   On: 05/22/2013 06:44   Dg Chest Port 1 View  05/14/2013   CLINICAL DATA:  Pleural effusion  EXAM: PORTABLE CHEST - 1 VIEW  COMPARISON:  05/12/2013  FINDINGS: Cardiomegaly with mild interstitial edema and moderate left pleural effusion, increased.  Suspected small left pleural effusion, new, with trace fluid along the right fissure.  Underlying left lower lobe opacity, likely atelectasis, pneumonia not excluded.  IMPRESSION: Cardiomegaly with mild interstitial edema.  Moderate left and small right pleural effusions, increased.  Underlying left lower lobe opacity, likely atelectasis.    Electronically Signed   By: Charline Bills M.D.   On: 05/14/2013 07:29   Dg Chest Port 1v Same Day  05/20/2013   *RADIOLOGY REPORT*  Clinical Data: CHF, query pneumonia  PORTABLE CHEST - 1 VIEW SAME DAY  Comparison: Prior chest x-ray 05/14/2013  Findings: Single frontal view of the chest demonstrates stable enlargement the cardiopericardial silhouette.  Improving pulmonary edema.  The left greater than right bilateral layering pleural effusions with associated bibasilar opacities.  No pneumothorax. No acute osseous abnormality.  IMPRESSION:  1.  Improving pulmonary edema. 2.  Persistent left larger than right layering pleural effusions. 3.  Bibasilar opacities likely reflect a combination of pleural fluid with atelectasis.  Superimposed infiltrate/pneumonia is difficult to exclude radiographically. 4.  Stable enlargement of the cardiopericardial silhouette which may reflect cardiomegaly and / or pericardial effusion.   Original Report Authenticated By: Malachy Moan, M.D.    Microbiology: Recent Results (from the past 240 hour(s))  MRSA PCR SCREENING     Status: None   Collection Time    05/12/13  3:51 PM      Result Value Range Status   MRSA by PCR NEGATIVE  NEGATIVE Final   Comment:            The GeneXpert MRSA Assay (FDA     approved for NASAL specimens     only), is one component of a     comprehensive MRSA colonization     surveillance program. It is not     intended to diagnose MRSA     infection nor to guide or     monitor treatment for     MRSA infections.  CULTURE, BLOOD (ROUTINE X 2)     Status: None   Collection Time    05/12/13  4:15 PM      Result Value Range Status   Specimen Description BLOOD RIGHT HAND   Final   Special Requests BOTTLES DRAWN AEROBIC AND ANAEROBIC 10CC   Final   Culture  Setup Time     Final   Value: 05/12/2013 20:24     Performed at [facility]   Culture     Final   Value: NO GROWTH 5 DAYS     Performed at [facility]    Report Status 05/18/2013 FINAL   Final  CULTURE, BLOOD (ROUTINE X 2)     Status: None   Collection Time    05/12/13  4:30 PM      Result Value Range Status   Specimen Description BLOOD RIGHT ARM   Final   Special Requests BOTTLES DRAWN AEROBIC ONLY 10CC   Final   Culture  Setup Time     Final   Value: 05/12/2013 20:24     Performed at [facility]   Culture     Final   Value: NO GROWTH 5 DAYS     Performed at [facility]   Report Status 05/18/2013 FINAL   Final     Labs: Basic Metabolic Panel:  Recent Labs Lab 05/18/13 1100 05/19/13 0800 05/20/13 0545 05/21/13 0540 05/22/13 0545  NA 134* 135 134* 133* 134*  K 4.0 4.0 4.0 4.4 4.4  CL 96 93* 90* 90* 89*  CO2 33* 35* 38* 41* 37*  GLUCOSE 177* 131* 162* 157* 151*  BUN 41* 40* 43* 42* 48*  CREATININE 1.03 1.10 1.06 1.14* 1.06  CALCIUM 10.3 10.5 10.3 10.4 10.3  Liver Function Tests:  Recent Labs Lab 05/21/13 1020  AST 28  ALT 18  ALKPHOS 230*  BILITOT 0.7  PROT 6.3  ALBUMIN 1.9*   Cardiac Enzymes:  Recent Labs Lab 05/15/13 2205 05/16/13 0449 05/16/13 1015 05/16/13 1638 05/17/13 1333  TROPONINI <0.30 <0.30 <0.30 <0.30 <0.30   BNP: BNP (last 3 results)  Recent Labs  10/15/12 2008 05/12/13 1055 05/16/13 0449  PROBNP 19285.0* 30492.0* 25364.0*   CBG:  Recent Labs Lab 05/21/13 1213 05/21/13 1726 05/21/13 2210 05/22/13 0830 05/22/13 1152  GLUCAP 179* 161* 147* 125* 245*   Signed:  ELLIS,ALLISON L.  [facility] 05/22/2013, 2:11 PM  I have personally examined this patient and reviewed the entire database. I have reviewed the above note, made any necessary editorial changes, and agree with its content.  Lonia Blood, MD [facility]

## 2013-05-29 ENCOUNTER — Ambulatory Visit: Payer: Medicare Other | Admitting: Cardiology

## 2013-07-03 NOTE — Progress Notes (Signed)
Patient ID: [name] [name], female   DOB: [birth year], 67 y.o.   MRN: [id number]  [place]  Allergies  Allergen Reactions  . Penicillins Anaphylaxis  . Sulfa Antibiotics Anaphylaxis  . Levofloxacin Itching    Chief Complaint  Patient presents with  . Acute Visit    staff concerns    HPI The nursing staff has asked me to see her for her right foot stump. There area is red; hot and inflamed there is no pedal pulse present. There are no reports of any fever present; she has lost weight.      Patient's Medications  New Prescriptions   No medications on file  Previous Medications   ACETAMINOPHEN (TYLENOL) 500 MG TABLET    Take 500 mg by mouth every 6 (six) hours as needed for pain.   ASPIRIN EC 81 MG EC TABLET    Take 1 tablet (81 mg total) by mouth daily.   BISMUTH SUBSALICYLATE (PEPTO BISMOL) 262 MG/15ML SUSPENSION    Take 30 mLs by mouth every 6 (six) hours as needed for indigestion (stomach).   DOCUSATE SODIUM (COLACE) 100 MG CAPSULE    Take 100 mg by mouth daily as needed for constipation.   ISOSORBIDE MONONITRATE (IMDUR) 30 MG 24 HR TABLET    Take 30 mg by mouth daily as needed (for weakness).   METFORMIN (GLUCOPHAGE) 500 MG TABLET    Take 500 mg by mouth 2 (two) times daily.    RANITIDINE (ZANTAC) 150 MG TABLET    Take 150 mg by mouth daily as needed for heartburn.  Modified Medications   No medications on file  Discontinued Medications   No medications on file    SIGNIFICANT DIAGNOSTIC EXAMS   01-05-13: chest x-ray: Progressive volume loss and bilateral pleural effusions suggesting  fluid overload.  01-20-13: right lower extremity arterial ultrasound: occlusion right popliteal artery.    LABS REVIEWED:   01-09-13: ca++ 12.9  01-17-13: wbc 7.4; hgb 11.1; hct 35.1; mcv 77.8 ;plt 326; glucose 101; bun 21; creat 0.93; k+ 4.6 Na++ 138; alk phos 159; albumin 2.5; ca++ 11.6  01-20-13: glucose 101; bun 24; creat 1.01; k+4.4; na++ 138; BNP 2656.6   .Review of Systems   Unable to perform ROS   Physical Exam  Constitutional: She appears well-developed and well-nourished.  Neck: Neck supple. No thyromegaly present.  Cardiovascular: Normal rate and regular rhythm.   +1 pp on left; absent  on right  Respiratory: Effort normal and breath sounds normal.  GI: Soft. Bowel sounds are normal. She exhibits no distension. There is no tenderness.  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  She know time; date; place; does have word confusion present  Skin: Skin is warm and dry.  Right foot dressing has old bloody drainage present her right foot is red warm to touch with necrosis present.   Psychiatric: She has a normal mood and affect.    ASSESSMENT/PLAN  Right foot cellulitis: will begin doxycycline 100 mg twice daily for 3 weeks with florastor twice daily for 3 weeks and will monitor her status. With her pvd; she will need to return to her surgeon asap

## 2013-07-18 LAB — CALCIUM, IONIZED

## 2013-07-24 DEATH — deceased

## 2013-09-11 ENCOUNTER — Encounter: Payer: Self-pay | Admitting: Internal Medicine

## 2013-11-10 IMAGING — CR DG CHEST 2V
2 series · 2 of 2 positions shown · non-contrast
Comparison: 07/11/2012

CLINICAL DATA: Cardiomyopathy.

CHEST - 2 VIEW

[w chest pa]
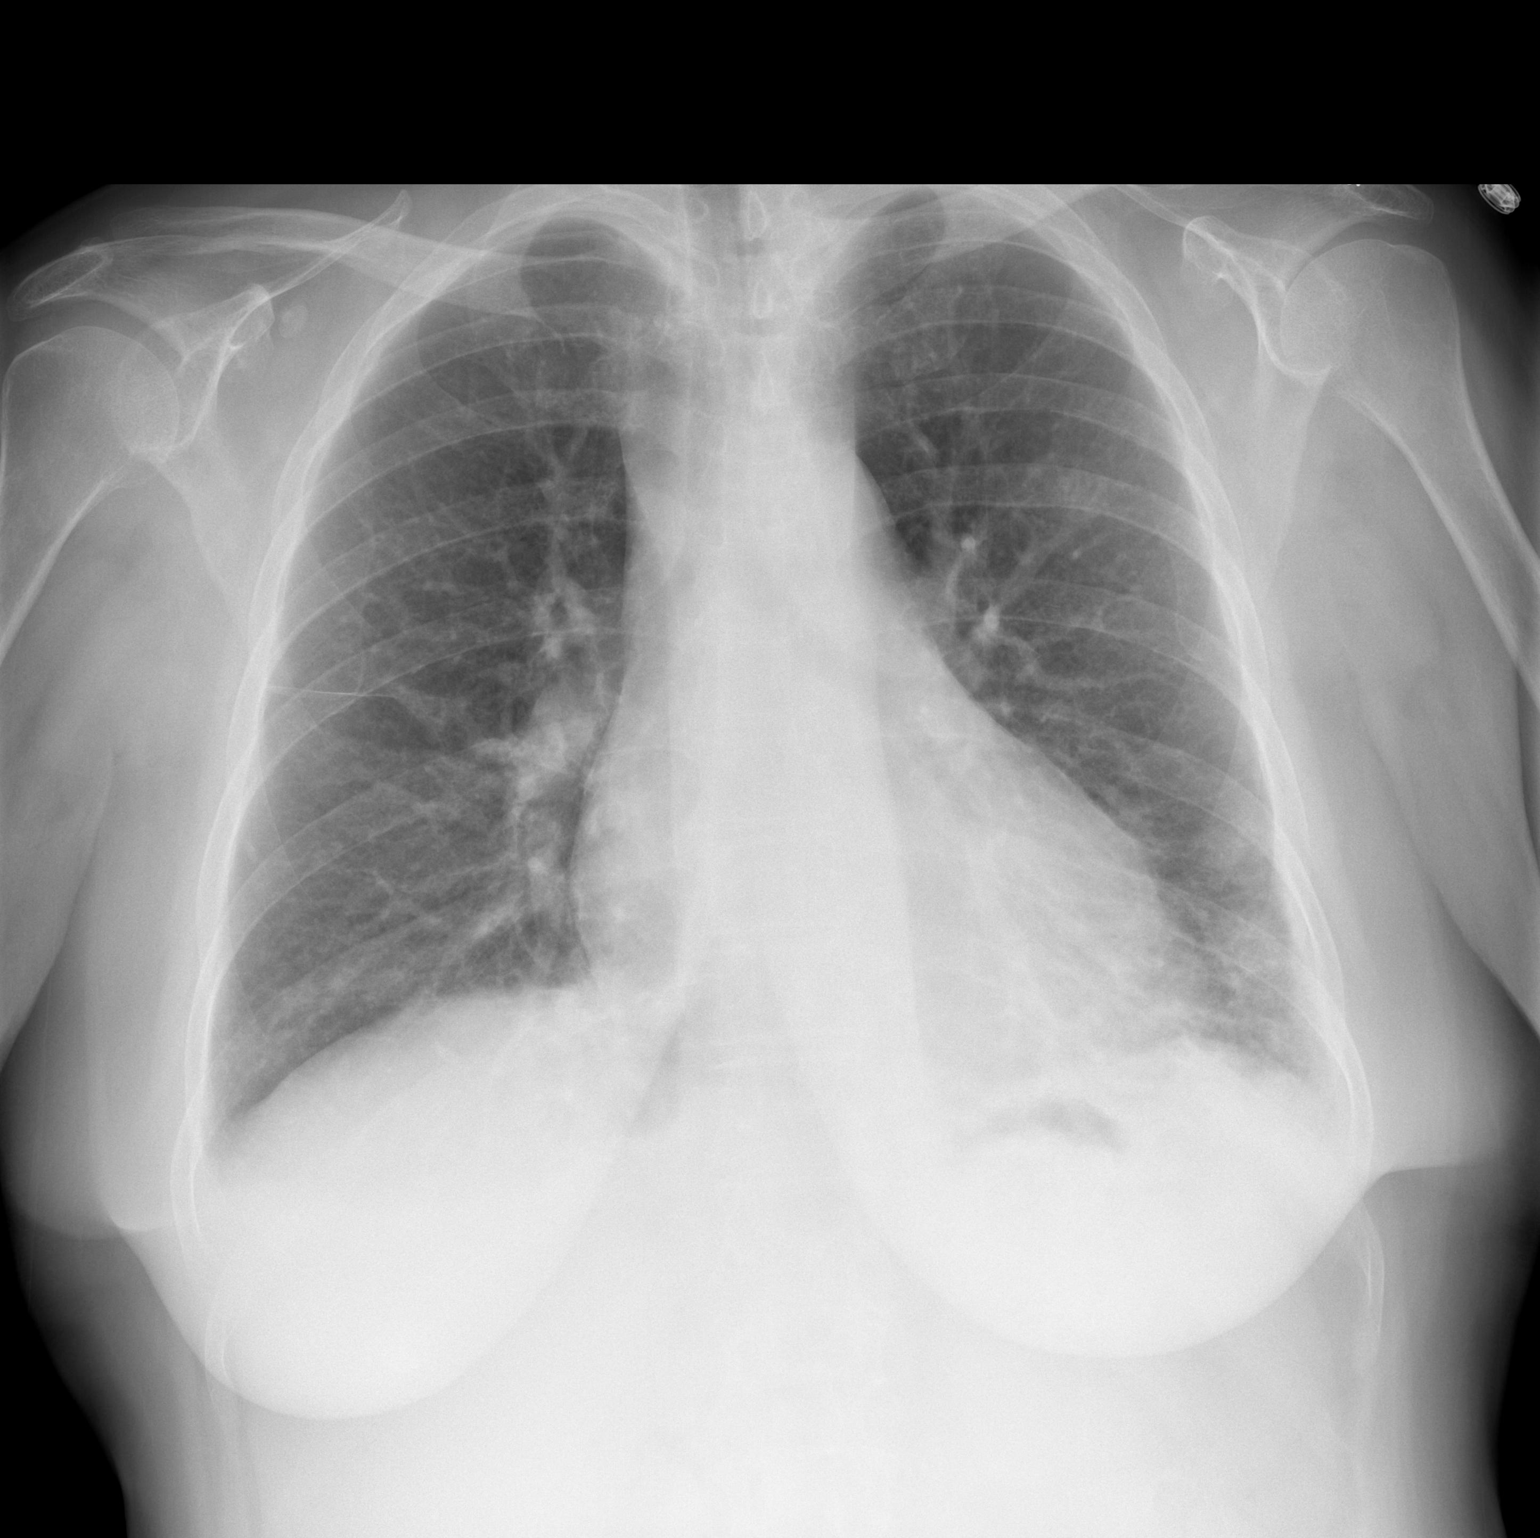

[w chest lat]
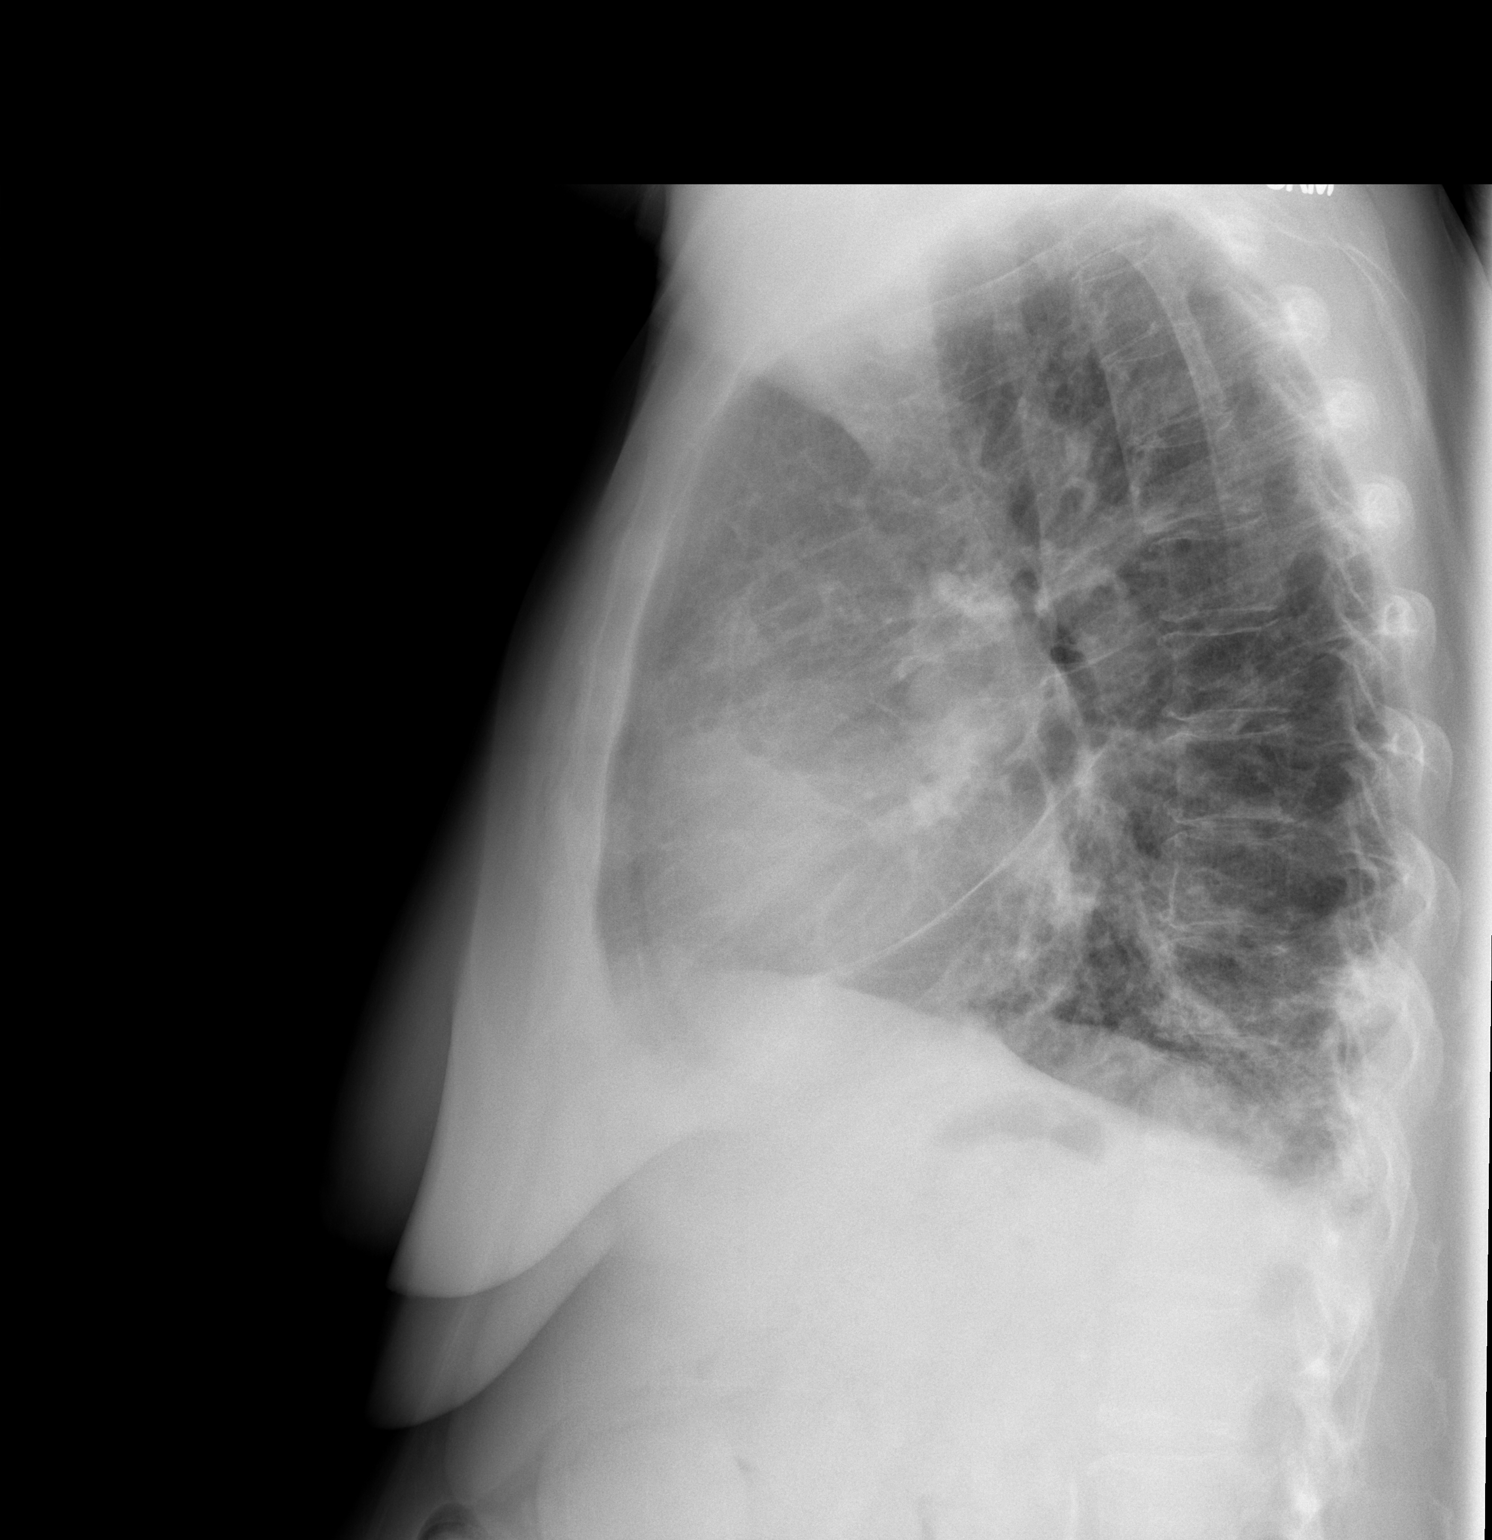

[2 of 2 positions shown; findings below may reference images not displayed]

FINDINGS: Two views of the chest were obtained.  There is improved
aeration in both lungs.  There are still patchy densities at the
lung bases, left side greater than right.  Heart size is within
normal limits and the trachea is midline.
IMPRESSION: Improving aeration in both lungs with residual patchy densities at
the lung bases, left side greater than right.

## 2014-05-16 ENCOUNTER — Non-Acute Institutional Stay (INDEPENDENT_AMBULATORY_CARE_PROVIDER_SITE_OTHER): Payer: Medicare Other | Admitting: Family Medicine

## 2014-05-16 DIAGNOSIS — Z593 Problems related to living in residential institution: Secondary | ICD-10-CM

## 2014-05-16 NOTE — Progress Notes (Signed)
Patient ID: [name] [name], female   DOB: [birth year], 68 y.o.   MRN: [id number] Opened note to add NH discharge date.

## 2014-08-02 ENCOUNTER — Encounter (HOSPITAL_COMMUNITY): Payer: Self-pay | Admitting: Cardiology

## 2014-09-04 IMAGING — CR DG CHEST 2V
2 series · 2 of 2 positions shown · non-contrast
Comparison: 04/27/2013

CLINICAL DATA: Shortness of breath, pain, fall

EXAM:
CHEST  2 VIEW

[w chest pa]
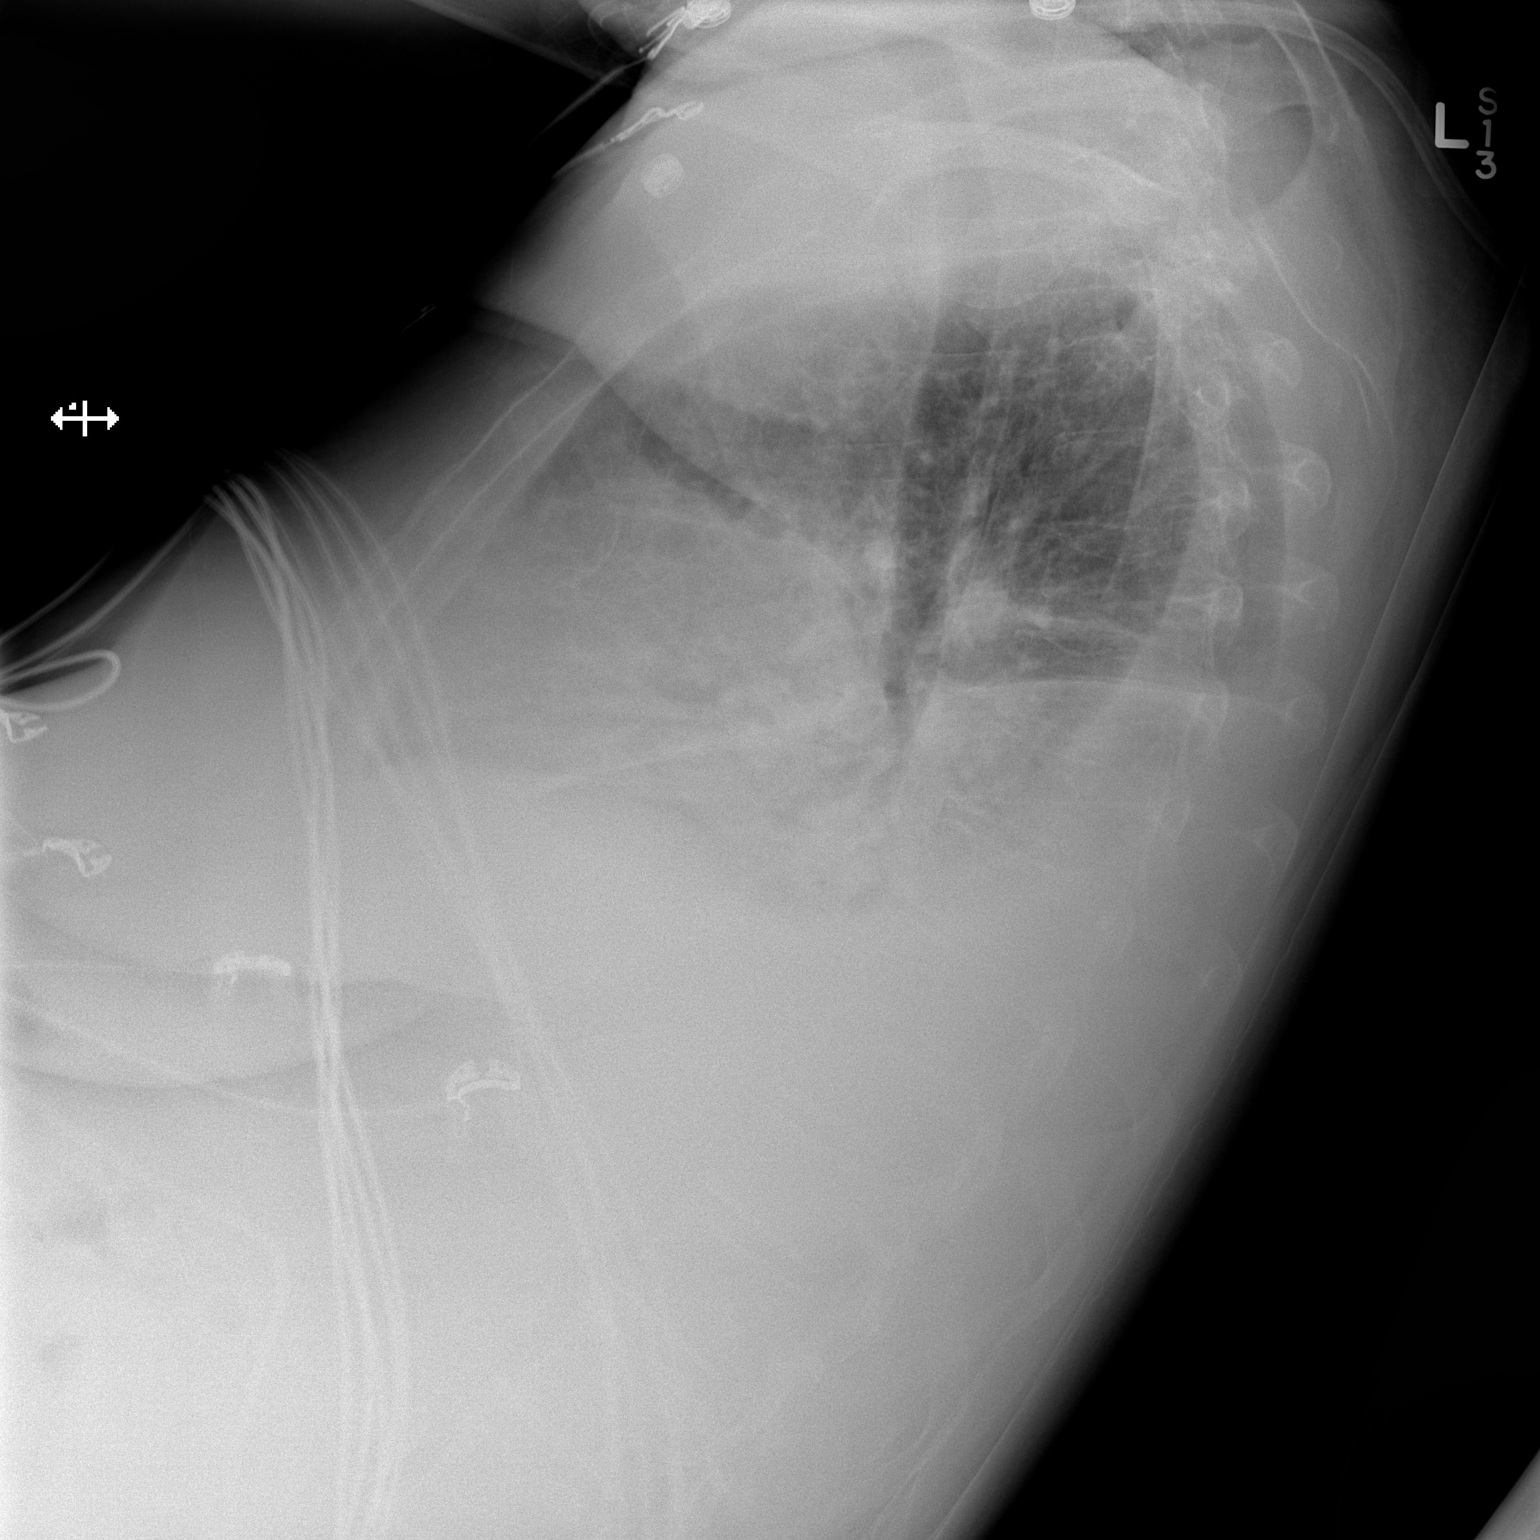

[x chest ap]
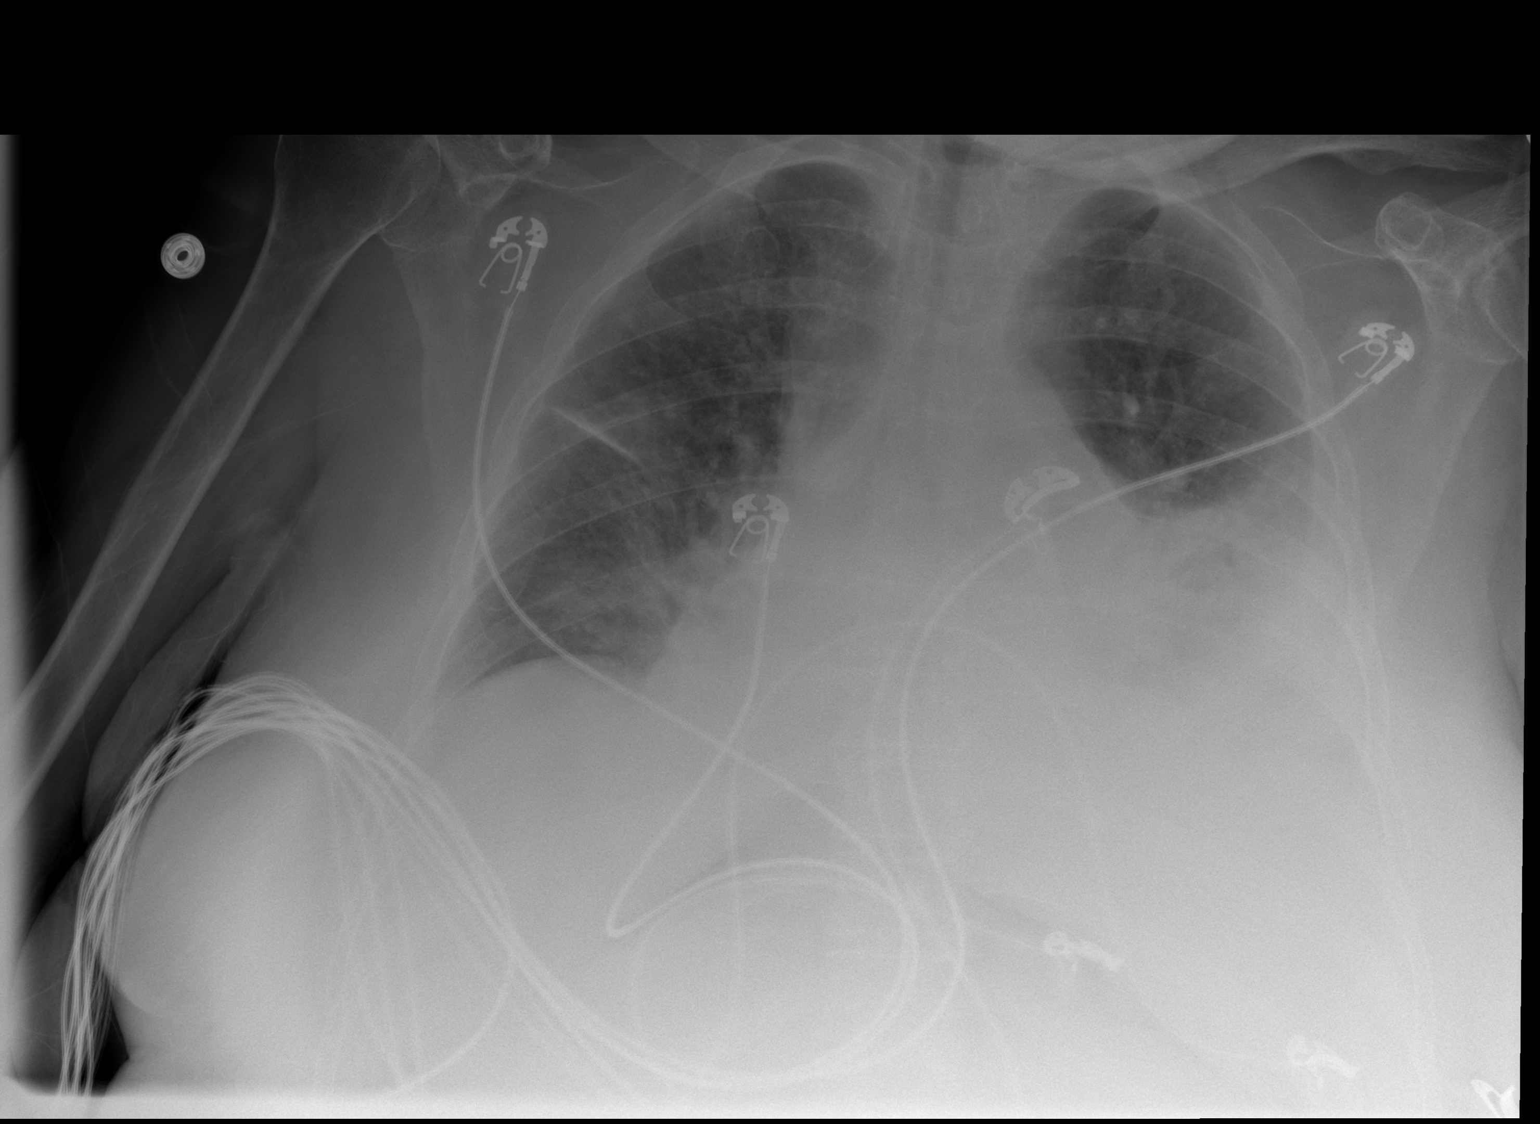

[2 of 2 positions shown; findings below may reference images not displayed]

FINDINGS: Cardiomegaly again noted. Stable linear scarring right midlung.
There is small left pleural effusion with left lower lobe
atelectasis or infiltrate.
IMPRESSION: Small left pleural effusion with left lower lobe atelectasis or
infiltrate. Stable scarring or atelectasis in right midlung.

## 2015-02-18 ENCOUNTER — Other Ambulatory Visit: Payer: Self-pay
